# Patient Record
Sex: Female | Born: 1935
Health system: Southern US, Community
[De-identification: ages and names within clinical notes are randomized; demographics above are authoritative.]

## PROBLEM LIST (undated history)

## (undated) DIAGNOSIS — M069 Rheumatoid arthritis, unspecified: Secondary | ICD-10-CM

## (undated) DIAGNOSIS — K5790 Diverticulosis of intestine, part unspecified, without perforation or abscess without bleeding: Secondary | ICD-10-CM

## (undated) DIAGNOSIS — I480 Paroxysmal atrial fibrillation: Secondary | ICD-10-CM

## (undated) DIAGNOSIS — Z87898 Personal history of other specified conditions: Secondary | ICD-10-CM

## (undated) DIAGNOSIS — I10 Essential (primary) hypertension: Secondary | ICD-10-CM

## (undated) DIAGNOSIS — J449 Chronic obstructive pulmonary disease, unspecified: Secondary | ICD-10-CM

## (undated) DIAGNOSIS — I890 Lymphedema, not elsewhere classified: Secondary | ICD-10-CM

## (undated) DIAGNOSIS — N183 Chronic kidney disease, stage 3 unspecified: Secondary | ICD-10-CM

## (undated) DIAGNOSIS — E782 Mixed hyperlipidemia: Secondary | ICD-10-CM

## (undated) HISTORY — DX: Diverticulosis of intestine, part unspecified, without perforation or abscess without bleeding: K57.90

## (undated) HISTORY — DX: Rheumatoid arthritis, unspecified: M06.9

## (undated) HISTORY — DX: Essential (primary) hypertension: I10

## (undated) HISTORY — DX: Personal history of other specified conditions: Z87.898

## (undated) HISTORY — DX: Chronic obstructive pulmonary disease, unspecified: J44.9

## (undated) HISTORY — DX: Paroxysmal atrial fibrillation: I48.0

## (undated) HISTORY — DX: Mixed hyperlipidemia: E78.2

## (undated) HISTORY — DX: Chronic kidney disease, stage 3 unspecified: N18.30

---

## 1967-11-18 HISTORY — PX: TOTAL ABDOMINAL HYSTERECTOMY: SHX209

## 2009-01-23 ENCOUNTER — Ambulatory Visit: Payer: Self-pay | Admitting: Cardiology

## 2009-02-20 ENCOUNTER — Ambulatory Visit: Payer: Self-pay | Admitting: Cardiology

## 2009-03-08 ENCOUNTER — Ambulatory Visit: Payer: Self-pay | Admitting: Cardiology

## 2009-09-03 DIAGNOSIS — R0602 Shortness of breath: Secondary | ICD-10-CM

## 2009-09-03 DIAGNOSIS — E785 Hyperlipidemia, unspecified: Secondary | ICD-10-CM | POA: Insufficient documentation

## 2009-09-03 DIAGNOSIS — I10 Essential (primary) hypertension: Secondary | ICD-10-CM | POA: Insufficient documentation

## 2011-04-01 NOTE — Assessment & Plan Note (Signed)
[facility] [address]                          [facility]ICE NOTE   NAME:Berry, Courtney WIST                     MRN:          [id number]  DATE:03/08/2009                            DOB:          [birth year]    PRIMARY CARDIOLOGIST:  Jonelle Sidle, MD   REASON FOR VISIT:  Scheduled followup.  Please refer to Dr. Ival Bible  initial consultation note of January 23, 2009, for complete details.   Courtney Berry returns to the clinic in followup of recent diagnostic  testing for evaluation of atypical chest pain.  A 2-D echocardiogram  indicated normal left ventricular function with normal wall motion,  moderate concentric LVH, and no significant valvular abnormalities.   A pharmacologic stress test, also reviewed by Dr. Diona Browner, was  suggestive of a mild, partially reversible defect in the anterior apical  wall; normal LVF (EF 63%).   Clinically, Courtney Berry continues to report no exertional angina pectoris  or significant dyspnea.  When she was last seen, she was complaining of  symptoms suggestive of bronchitis.  This has since resolved.   CURRENT MEDICATIONS:  1. Aspirin 81 daily.  2. Hydrochlorothiazide 12.5 daily.  3. Crestor 10 daily.   PHYSICAL EXAMINATION:  VITAL SIGNS:  Blood pressure 167/84; pulse 91,  regular; and weight 211.6.  GENERAL:  A 77 year old female sitting upright in no distress.  HEENT:  Normocephalic, atraumatic.  NECK:  Palpable carotid pulse without bruits; no JVD.  LUNGS:  Clear to auscultation in all fields.  HEART:  Regular rate rhythm.  No significant murmurs.  No rubs.  ABDOMEN:  Soft, nontender.  EXTREMITIES:  No edema.  NEURO:  No focal deficit.   IMPRESSION:  1. Atypical chest pain.      a.     Normal left ventricular function with no obvious wall motion       abnormalities, by 2-D echocardiogram.      b.     Low-risk pharmacologic profusion imaging study.  2. Hypertension, uncontrolled.  3. Dyslipidemia.   PLAN:  No further cardiac testing is indicated at this point in time.  The patient does need, however, to continue aggressive medical  management for her blood pressure.  I instructed her to monitor this  closely in the ambulatory setting, and to review this with Dr. Charm Barges at  time of her next scheduled followup.  In the meanwhile, she is to remain  on low-dose aspirin indefinitely.  She is also to remain on a statin,  with continued close monitoring and management by Dr. Charm Barges.  We will  otherwise have her return to Dr. Diona Browner, on an as-needed basis.      Rozell Searing, PA-C  Electronically Signed      Jonelle Sidle, MD  Electronically Signed   GS/MedQ  DD: 03/08/2009  DT: 03/09/2009  Job #: [id number]   cc:   Samuel Jester

## 2011-04-01 NOTE — Assessment & Plan Note (Signed)
[facility]                          [facility]ICE NOTE   NAME:Deloatch, Courtney Berry                     MRN:          [id number]  DATE:01/23/2009                            DOB:          03-23-1933    REFERRING PHYSICIAN:  Dr. Aniceto Boss   REASON FOR EVALUATION:  Chest pain.   HISTORY OF PRESENT ILLNESS:  Ms. Yeagle is a pleasant 77 year old woman  with a history of hypertension and hyperlipidemia.  She states that she  has had episodes of bronchitis over the years, but has had more trouble  with this lately.  She reports developing symptoms of cough, wheezing,  and shortness of breath, beginning sometime in early February, not  associated with any obvious fevers or sputum production, and originally  treated with azithromycin, steroids, and a cough suppressant based on  records.  She was felt to have a component of sinusitis as well.  There  is description in the notes that she may have had some chest tightness  at that time, although Ms. Tarkowski denies this today.  I asked her  several times about any recent chest pain and she really describes only  problems with cough and a mild sense of shortness of breath.  She denies  any typical exertional chest pain or pressure.  She has not undergone  any prior cardiac ischemic evaluation, however.  Her electrocardiogram  shows sinus rhythm with occasional premature ventricular complexes and  otherwise nonspecific ST changes.  Ms. Wetsel denies any palpitations,  dizziness, or syncope.  She states that her cough and shortness of  breath originally improved, although she started to have return of  symptoms this week and plans to see Marylene Land back in the office soon.  She  reports being around a sister who has been sick.   ALLERGIES:  PENICILLIN, SULFA drug, MACROBID, and DARVOCET.   CURRENT MEDICATIONS:  1. Aspirin 81 mg p.o. daily.  2. Crestor 10 mg p.o. daily.  3. Hydrochlorothiazide 12.5 mg p.o.  daily.  4. Naprosyn p.r.n.  5. Tylenol p.r.n.   PAST MEDICAL HISTORY:  As outlined above.  She denies any personal  history of diabetes mellitus.  She has had previous problems with  diverticulosis and a status post hysterectomy in 1969.   FAMILY HISTORY:  Reviewed and is significant for cardiovascular disease  in one of the patient's 4 sisters with coronary bypass grafting in age  56.  Otherwise, no premature cardiovascular disease noted.  The  patient's mother and father died of cancer as well as 1 of her brothers.   SOCIAL HISTORY:  The patient has no active tobacco use history.  Denies  any alcohol use.  She describes herself as active, but does not exercise  regularly.   REVIEW OF SYSTEMS:  As detailed above.  She has had no hemoptysis,  melena, or hematochezia.  She complains of chronic arthritic knee pain  and uses p.r.n. Naprosyn and Tylenol for this.  No clear claudication  based on review of systems.  Appetite has been stable.  No unintended  weight loss.  Otherwise, negative.  PHYSICAL EXAMINATION:  VITAL SIGNS:  Blood pressure was elevated at  173/90, heart rate is 87, weight is 211.6 pounds.  GENERAL:  The patient is in no acute distress.  HEENT:  Conjunctiva is normal.  Oropharynx is clear.  NECK:  Supple.  No elevated jugular venous pressure noted.  No loud  carotid bruits.  LUNGS:  Clear with diminished breath sounds throughout.  Nonlabored  breathing.  There is no wheezing or obvious egophony. CARDIAC:  Regular  rate and rhythm, occasional ectopic beat.  Soft S4 and soft basal  systolic murmur.  PMI is indistinct.  ABDOMEN:  Soft and nontender.  No  active bowel sounds.  I would able to palpate liver edge.  EXTREMITIES:  No frank pitting edema.  There is mild venous stasis.  Distal pulses are 1+.  SKIN:  Warm and dry.  MUSCULOSKELETAL:  No kyphosis noted.  NEUROPSYCHIATRIC:  The patient is alert and orientedx3.  Affect seems  appropriate.   IMPRESSION AND  RECOMMENDATIONS:  Recent history of cough, shortness of  breath, wheezing, and possibly some intermittent atypical chest pain,  although the patient denies this today.  This is in the setting of a  reported history of recurrent bronchitis.  Her resting electrocardiogram  shows no acute ST-T wave changes, although she does have some premature  ventricular complexes noted occasionally.  She has had no associated  dizziness or syncope and has had no obvious orthopnea or progressive  lower extremity edema.  It may well be that her symptoms are most  reflective of bronchitis flare.  She does have cardiac risk factors  including hypertension and hyperlipidemia with some element of family  history involving 1 of her sisters.  I agree that basic cardiac risk  stratification would be reasonable.  Ms. Tilmon is not inclined to  proceed with stress testing now until she gets over her bronchitis.  I  would like to obtain a baseline echocardiogram to ensure that she does  not have any left ventricular systolic dysfunction.  If this is the  case, we will need to consider testing sooner rather than later.  Otherwise, we will plan to schedule an adenosine Cardiolite over the  next month and I will plan to bring back to the office to discuss the  results.  Further plans to follow.     Jonelle Sidle, MD  Electronically Signed    SGM/MedQ  DD: 01/23/2009  DT: 01/23/2009  Job #: [id number]   cc:   Samuel Jester

## 2011-05-22 ENCOUNTER — Encounter: Payer: Self-pay | Admitting: Cardiology

## 2012-02-23 ENCOUNTER — Other Ambulatory Visit: Payer: Self-pay

## 2012-02-27 ENCOUNTER — Encounter: Payer: Self-pay | Admitting: *Deleted

## 2012-03-02 ENCOUNTER — Encounter: Payer: Self-pay | Admitting: Cardiology

## 2012-03-02 ENCOUNTER — Ambulatory Visit (INDEPENDENT_AMBULATORY_CARE_PROVIDER_SITE_OTHER): Payer: Medicare Other | Admitting: Cardiology

## 2012-03-02 VITALS — BP 147/83 | HR 96 | Ht 67.0 in | Wt 213.1 lb

## 2012-03-02 DIAGNOSIS — E785 Hyperlipidemia, unspecified: Secondary | ICD-10-CM

## 2012-03-02 DIAGNOSIS — R002 Palpitations: Secondary | ICD-10-CM

## 2012-03-02 DIAGNOSIS — I10 Essential (primary) hypertension: Secondary | ICD-10-CM

## 2012-03-02 MED ORDER — METOPROLOL SUCCINATE ER 25 MG PO TB24: 25.0000 mg | ORAL_TABLET | Freq: Every day | ORAL | Status: DC

## 2012-03-02 NOTE — Progress Notes (Signed)
   Clinical Summary [name] is a 78 y.o.female presenting to the office for further evaluation. She was last seen in 2010 with atypical chest pain symptoms. Pharmacologic Cardiolite at that point revealed a mild, partially reversible defect in the anterior apex with normal LVEF of 63%. In followup she had no clear angina symptoms, and medical therapy with observation was recommended.  She is here with her daughter. Complains of intermittent palpitations, difficult for her to describe, feels like a "flutter." No chest pain, dizziness, or syncope. She has been managed for hypertension, recent addition of lisinopril. Since then symptoms seem less frequent. She has had no cardiac testing since 2010.  No exertional chest pain. Does have osteoarthritis with back pain when she stands for long periods of time.   Allergies  Allergen Reactions  . Darvocet (Propoxacet-N)   . Macrobid   . Penicillins   . Plavix (Clopidogrel Bisulfate) Other (See Comments)    ???  . Sulfa Antibiotics     Current Outpatient Prescriptions  Medication Sig Dispense Refill  . aspirin EC 81 MG tablet Take 81 mg by mouth daily.      . ergocalciferol (VITAMIN D2) 50000 UNITS capsule Take 50,000 Units by mouth once a week.      Marland Kitchen lisinopril (PRINIVIL,ZESTRIL) 10 MG tablet Take 10 mg by mouth daily.      . rosuvastatin (CRESTOR) 10 MG tablet Take 10 mg by mouth daily.      . metoprolol succinate (TOPROL XL) 25 MG 24 hr tablet Take 1 tablet (25 mg total) by mouth daily.  30 tablet  6    Past Medical History  Diagnosis Date  . Mixed hyperlipidemia   . Essential hypertension, benign   . History of chest pain     Low risk Cardiolite 2010  . Diverticulosis     Past Surgical History  Procedure Date  . Total abdominal hysterectomy 1969    Family History  Problem Relation Age of Onset  . Cancer Mother   . Heart disease Sister     CABG age 74  . Cancer Father   . Cancer Brother     Social History [name]  reports that she has never smoked. She has never used smokeless tobacco. [name] reports that she does not drink alcohol.  Review of Systems Otherwise negative.  Physical Examination Filed Vitals:   03/02/12 1412  BP: 147/83  Pulse: 96   Obese woman in no acute distress. HEENT: Conjunctiva and lids normal, oropharynx clear. Neck: Supple, no elevated JVP or carotid bruits, no thyromegaly. Lungs: Clear to auscultation, nonlabored breathing at rest. Cardiac: Regular rate and rhythm, no S3, soft systolic murmur, no pericardial rub. Abdomen: Soft, nontender, bowel sounds present, no guarding or rebound. Extremities: No pitting edema, distal pulses 1-2+. Skin: Warm and dry. Musculoskeletal: No kyphosis. Neuropsychiatric: Alert and oriented x3, affect grossly appropriate.   ECG Tracing from Dr. Silvana Newness office reviewed showing ventricular ectopy in pattern of trigeminy.    Problem List and Plan

## 2012-03-02 NOTE — Patient Instructions (Signed)
Follow up in 3 months. Stop HCTZ. Start Toprol XL (metoprolol succ) 25 mg daily. Your physician has requested that you have an echocardiogram. Echocardiography is a painless test that uses sound waves to create images of your heart. It provides your doctor with information about the size and shape of your heart and how well your heart's chambers and valves are working. This procedure takes approximately one hour. There are no restrictions for this procedure. If the results of your test are normal or stable, you will receive a letter. If they are abnormal, the nurse will contact you by phone.

## 2012-03-02 NOTE — Assessment & Plan Note (Signed)
Medical therapy as outlined above. Sodium restriction.

## 2012-03-02 NOTE — Assessment & Plan Note (Signed)
Possibly symptomatic ectopy, rule out intermittent arrhythmia. Recent ECG reviewed. Plan is for her to continue present medical therapy with the exception of discontinuing HCTZ in favor of Toprol-XL 25 mg daily. We will also obtain an echocardiogram to exclude any major cardiac structural abnormalities. Continue observation with followup arranged.

## 2012-03-02 NOTE — Assessment & Plan Note (Signed)
On statin therapy, followed by Dr. Butler. 

## 2012-03-17 ENCOUNTER — Other Ambulatory Visit (INDEPENDENT_AMBULATORY_CARE_PROVIDER_SITE_OTHER): Payer: Medicare Other

## 2012-03-17 ENCOUNTER — Other Ambulatory Visit: Payer: Self-pay

## 2012-03-17 DIAGNOSIS — R9431 Abnormal electrocardiogram [ECG] [EKG]: Secondary | ICD-10-CM

## 2012-03-17 DIAGNOSIS — R002 Palpitations: Secondary | ICD-10-CM

## 2012-05-24 ENCOUNTER — Encounter: Payer: Self-pay | Admitting: Cardiology

## 2012-05-24 ENCOUNTER — Ambulatory Visit (INDEPENDENT_AMBULATORY_CARE_PROVIDER_SITE_OTHER): Payer: Medicare Other | Admitting: Cardiology

## 2012-05-24 VITALS — BP 144/74 | HR 66 | Ht 67.0 in | Wt 216.0 lb

## 2012-05-24 DIAGNOSIS — I10 Essential (primary) hypertension: Secondary | ICD-10-CM

## 2012-05-24 DIAGNOSIS — R002 Palpitations: Secondary | ICD-10-CM

## 2012-05-24 NOTE — Patient Instructions (Signed)
Your physician recommends that you schedule a follow-up appointment in: as needed Your physician recommends that you continue on your current medications as directed. Please refer to the Current Medication list given to you today.   

## 2012-05-24 NOTE — Progress Notes (Signed)
   HPI The patient presents for followup. She saw Dr. Diona Browner for evaluation and treatment of palpitations. He switched her from hydrochlorothiazide to metoprolol XL 25 mg daily. She couldn't tolerate this because of stomach upset so she's cutting the pill in half. She's done well with this. She's had no palpitations and no further "spells". She's had no presyncope or syncope. She denies any chest pressure, neck or arm discomfort. She has no new shortness of breath, PND or orthopnea. She can't ambulate because of bad knees. She says her blood pressure has been well controlled at home.  Allergies  Allergen Reactions  . Darvocet (Propoxyphene-Acetaminophen)   . Nitrofurantoin Monohyd Macro   . Penicillins   . Plavix (Clopidogrel Bisulfate) Other (See Comments)    ???  . Sulfa Antibiotics     Current Outpatient Prescriptions  Medication Sig Dispense Refill  . aspirin EC 81 MG tablet Take 81 mg by mouth daily.      . ergocalciferol (VITAMIN D2) 50000 UNITS capsule Take 50,000 Units by mouth once a week.      Marland Kitchen lisinopril (PRINIVIL,ZESTRIL) 10 MG tablet Take 10 mg by mouth daily.      . metoprolol succinate (TOPROL XL) 25 MG 24 hr tablet Take 1 tablet (25 mg total) by mouth daily.  30 tablet  6  . rosuvastatin (CRESTOR) 10 MG tablet Take 10 mg by mouth daily.        Past Medical History  Diagnosis Date  . Mixed hyperlipidemia   . Essential hypertension, benign   . History of chest pain     Low risk Cardiolite 2010  . Diverticulosis     Past Surgical History  Procedure Date  . Total abdominal hysterectomy 1969    ROS:  As stated in the HPI and negative for all other systems.  PHYSICAL EXAM Ht 5\' 7"  (1.702 m) GENERAL:  Well appearing NECK:  No jugular venous distention, waveform within normal limits, carotid upstroke brisk and symmetric, no bruits, no thyromegaly LUNGS:  Clear to auscultation bilaterally BACK:  No CVA tenderness CHEST:  Unremarkable HEART:  PMI not displaced  or sustained,S1 and S2 within normal limits, no S3, no S4, no clicks, no rubs, no murmurs ABD:  Flat, positive bowel sounds normal in frequency in pitch, no bruits, no rebound, no guarding, no midline pulsatile mass, no hepatomegaly, no splenomegaly EXT:  2 plus pulses throughout, no edema, no cyanosis no clubbing   ASSESSMENT AND PLAN

## 2012-05-24 NOTE — Assessment & Plan Note (Signed)
The blood pressure is at target. No change in medications is indicated. We will continue with therapeutic lifestyle changes (TLC).  

## 2012-05-24 NOTE — Assessment & Plan Note (Signed)
These are no longer problematic.  No further work up is indicated.  She will continue the meds as listed.

## 2013-03-09 ENCOUNTER — Other Ambulatory Visit: Payer: Self-pay | Admitting: *Deleted

## 2013-03-09 MED ORDER — METOPROLOL SUCCINATE ER 25 MG PO TB24: 25.0000 mg | ORAL_TABLET | Freq: Every day | ORAL | Status: DC

## 2014-02-20 ENCOUNTER — Encounter: Payer: Self-pay | Admitting: Cardiology

## 2014-02-20 ENCOUNTER — Ambulatory Visit (INDEPENDENT_AMBULATORY_CARE_PROVIDER_SITE_OTHER): Payer: Medicare Other | Admitting: Cardiology

## 2014-02-20 VITALS — BP 191/70 | HR 54 | Ht 69.0 in | Wt 217.0 lb

## 2014-02-20 DIAGNOSIS — R0602 Shortness of breath: Secondary | ICD-10-CM

## 2014-02-20 DIAGNOSIS — I10 Essential (primary) hypertension: Secondary | ICD-10-CM

## 2014-02-20 DIAGNOSIS — E785 Hyperlipidemia, unspecified: Secondary | ICD-10-CM

## 2014-02-20 DIAGNOSIS — Z79899 Other long term (current) drug therapy: Secondary | ICD-10-CM

## 2014-02-20 DIAGNOSIS — R002 Palpitations: Secondary | ICD-10-CM

## 2014-02-20 MED ORDER — HYDROCHLOROTHIAZIDE 12.5 MG PO CAPS: 12.5000 mg | ORAL_CAPSULE | Freq: Every day | ORAL | Status: DC

## 2014-02-20 NOTE — Assessment & Plan Note (Signed)
On Crestor, LDL 52 as of September 2014.

## 2014-02-20 NOTE — Progress Notes (Signed)
Clinical Summary Courtney Berry is a 80 y.o.female last seen by Dr. Antoine Poche in July 2013. I had seen her prior to that. She is here with her daughter today. Referred back to the office with some recent symptoms of relative fatigue and shortness of breath. She states she was temporarily placed on a low-dose diuretic for a week and did feel better. Has ankle edema, right worse than left, also reports arthritic right knee pain that limits her. She has had no exertional chest pain. Still states the palpitations are much better on beta blocker.  Previous testing includes a pharmacologic Cardiolite in 2010 that revealed a mild, partially reversible defect in the anterior apex with normal LVEF of 63%. She has been managed medically.  More recently, echocardiogram in May 2013 revealed moderate LVH with LVEF 55-60%, grade 1 diastolic dysfunction, no major valvular abnormalities with normal pulmonary artery systolic pressure.  Lab work from September 2014 showed BUN 14, creatinine 1.0, normal AST and ALT, triglycerides 160, cholesterol 144, HDL 60, LDL 52, potassium 4.3, TSH 1.7, hemoglobin A1c 5.6, hemoglobin 13.7, platelets 150.   Allergies  Allergen Reactions  . Darvocet [Propoxyphene N-Acetaminophen]   . Nitrofurantoin Monohyd Macro   . Penicillins   . Plavix [Clopidogrel Bisulfate] Other (See Comments)    ???  . Sulfa Antibiotics     Current Outpatient Prescriptions  Medication Sig Dispense Refill  . aspirin EC 81 MG tablet Take 81 mg by mouth daily.      . ergocalciferol (VITAMIN D2) 50000 UNITS capsule Take 50,000 Units by mouth once a week.      . fluticasone (FLONASE) 50 MCG/ACT nasal spray Place 1-2 sprays into both nostrils as needed.      Marland Kitchen lisinopril (PRINIVIL,ZESTRIL) 10 MG tablet Take 10 mg by mouth daily.      . metoprolol succinate (TOPROL-XL) 25 MG 24 hr tablet Take 1 tablet (25 mg total) by mouth daily.  30 tablet  3  . rosuvastatin (CRESTOR) 10 MG tablet Take 10 mg by mouth  daily.      . hydrochlorothiazide (MICROZIDE) 12.5 MG capsule Take 1 capsule (12.5 mg total) by mouth daily.  30 capsule  6   No current facility-administered medications for this visit.    Past Medical History  Diagnosis Date  . Mixed hyperlipidemia   . Essential hypertension, benign   . History of chest pain     Low risk Cardiolite 2010  . Diverticulosis     Past Surgical History  Procedure Laterality Date  . Total abdominal hysterectomy  1969    Family History  Problem Relation Age of Onset  . Cancer Mother   . Heart disease Sister     CABG age 64  . Cancer Father   . Cancer Brother     Social History Ms. Bently reports that she has never smoked. She has never used smokeless tobacco. Ms. Impson reports that she does not drink alcohol.  Review of Systems Negative except as outlined.  Physical Examination Filed Vitals:   02/20/14 0915  BP: 191/70  Pulse: 54   Filed Weights   02/20/14 0915  Weight: 217 lb (98.431 kg)    Obese woman, appears comfortable at rest.  HEENT: Conjunctiva and lids normal, oropharynx clear.  Neck: Supple, no elevated JVP or carotid bruits, no thyromegaly.  Lungs: Clear to auscultation, nonlabored breathing at rest.  Cardiac: Regular rate and rhythm, no S3, soft systolic murmur, no pericardial rub.  Abdomen: Soft, nontender, bowel sounds  present, no guarding or rebound.  Extremities: Ankle edema, distal pulses 1-2+.  Skin: Warm and dry.  Musculoskeletal: No kyphosis.  Neuropsychiatric: Alert and oriented x3, affect grossly appropriate.   Problem List and Plan   Shortness of breath Also fatigue, but no exertional chest pain. ECG today shows normal sinus rhythm, borderline low voltage. Plan is to initiate HCTZ 12.5 mg daily in addition to baseline hypertensive regimen, also followup with echocardiogram to reassess cardiac structure and function. Office visit in the next 3-4 weeks with BMET.  Palpitations Improved on beta  blocker.  HYPERTENSION, UNSPECIFIED Adding HCTZ as noted above. No change to other medications.  HYPERLIPIDEMIA-MIXED On Crestor, LDL 52 as of September 2014.    Jonelle Sidle, M.D., F.A.C.C.

## 2014-02-20 NOTE — Assessment & Plan Note (Signed)
Adding HCTZ as noted above. No change to other medications.

## 2014-02-20 NOTE — Assessment & Plan Note (Signed)
Improved on beta blocker  

## 2014-02-20 NOTE — Assessment & Plan Note (Signed)
Also fatigue, but no exertional chest pain. ECG today shows normal sinus rhythm, borderline low voltage. Plan is to initiate HCTZ 12.5 mg daily in addition to baseline hypertensive regimen, also followup with echocardiogram to reassess cardiac structure and function. Office visit in the next 3-4 weeks with BMET.

## 2014-02-20 NOTE — Patient Instructions (Signed)
Your physician has requested that you have an echocardiogram. Echocardiography is a painless test that uses sound waves to create images of your heart. It provides your doctor with information about the size and shape of your heart and how well your heart's chambers and valves are working. This procedure takes approximately one hour. There are no restrictions for this procedure. Office will contact with results via phone or letter.   Begin HCTZ 12.5mg  daily - new sent to pharm Continue all other medications.   Lab for BMET - due just prior to next office visit Follow up in  3-4 weeks

## 2014-03-02 ENCOUNTER — Other Ambulatory Visit (INDEPENDENT_AMBULATORY_CARE_PROVIDER_SITE_OTHER): Payer: Medicare Other

## 2014-03-02 ENCOUNTER — Other Ambulatory Visit: Payer: Self-pay

## 2014-03-02 DIAGNOSIS — I059 Rheumatic mitral valve disease, unspecified: Secondary | ICD-10-CM

## 2014-03-02 DIAGNOSIS — I359 Nonrheumatic aortic valve disorder, unspecified: Secondary | ICD-10-CM

## 2014-03-02 DIAGNOSIS — R0602 Shortness of breath: Secondary | ICD-10-CM

## 2014-03-03 ENCOUNTER — Telehealth: Payer: Self-pay | Admitting: *Deleted

## 2014-03-03 NOTE — Telephone Encounter (Signed)
Message copied by Eustace Moore on Fri Mar 03, 2014  1:31 PM ------      Message from: MCDOWELL, Illene Bolus      Created: Thu Mar 02, 2014  1:00 PM       Reviewed. LVEF remains normal range at 60-65%, mild diastolic dysfunction with hypertension. We already added HCTZ to her regimen. Keep planned followup. ------

## 2014-03-03 NOTE — Telephone Encounter (Signed)
Message copied by Eustace Moore on Fri Mar 03, 2014  4:02 PM ------      Message from: MCDOWELL, Illene Bolus      Created: Thu Mar 02, 2014  1:00 PM       Reviewed. LVEF remains normal range at 60-65%, mild diastolic dysfunction with hypertension. We already added HCTZ to her regimen. Keep planned followup. ------

## 2014-03-03 NOTE — Telephone Encounter (Signed)
Patient informed. 

## 2014-03-06 ENCOUNTER — Ambulatory Visit: Payer: Medicare Other | Admitting: Cardiology

## 2014-04-04 ENCOUNTER — Ambulatory Visit: Payer: Medicare Other | Admitting: Cardiology

## 2014-04-19 ENCOUNTER — Encounter: Payer: Self-pay | Admitting: Cardiology

## 2014-04-19 NOTE — Progress Notes (Signed)
Clinical Summary [name] is a 80 y.o.female last seen in April of this year. Low-dose HCTZ was added to her regimen at the last visit. Record review finds ER visit at [facility] recently with diagnosis of bronchitis. She states that she is starting to feel better. Plans to followup with [name]. No chest pain or palpitations.  Recent laboratory [facility] showed BUN 14, creatinine 0.9, normal LFTs, potassium 3.7, troponin I normal, BNP normal at 15, hemoglobin 13.5, platelets 123. Chest x-ray reported bronchitic changes, no other acute abnormalities.  Previous testing includes a pharmacologic Cardiolite in 2010 that revealed a mild, partially reversible defect in the anterior apex with normal LVEF of 63%. She has been managed medically.  Followup echocardiogram in April of this year showed moderate LVH with LVEF 60-65%, grade 1 diastolic dysfunction, mildly sclerotic aortic valve, mild mitral regurgitation.   Allergies  Allergen Reactions  . Darvocet [Propoxyphene N-Acetaminophen]   . Nitrofurantoin Monohyd Macro   . Penicillins   . Plavix [Clopidogrel Bisulfate] Other (See Comments)    ???  . Sulfa Antibiotics     Current Outpatient Prescriptions  Medication Sig Dispense Refill  . aspirin EC 81 MG tablet Take 81 mg by mouth daily.      . fluticasone (FLONASE) 50 MCG/ACT nasal spray Place 1-2 sprays into both nostrils as needed.      . hydrochlorothiazide (MICROZIDE) 12.5 MG capsule Take 1 capsule (12.5 mg total) by mouth daily.  30 capsule  6  . lisinopril (PRINIVIL,ZESTRIL) 10 MG tablet Take 10 mg by mouth daily.      . metoprolol succinate (TOPROL-XL) 25 MG 24 hr tablet Take 1 tablet (25 mg total) by mouth daily.  30 tablet  3  . rosuvastatin (CRESTOR) 10 MG tablet Take 10 mg by mouth daily.      Marland Kitchen VITAMIN D, CHOLECALCIFEROL, PO Take 1 tablet by mouth daily.       No current facility-administered medications for this visit.    Past Medical History  Diagnosis Date  . Mixed  hyperlipidemia   . Essential hypertension, benign   . History of chest pain     Low risk Cardiolite 2010  . Diverticulosis     Social History Courtney Berry reports that she has never smoked. She has never used smokeless tobacco. Courtney Berry reports that she does not drink alcohol.  Review of Systems Negative except as outlined.  Physical Examination Filed Vitals:   04/20/14 1131  BP: 137/72  Pulse: 62   Filed Weights   04/20/14 1131  Weight: 213 lb 12.8 oz (96.979 kg)    Obese woman, appears comfortable at rest.  HEENT: Conjunctiva and lids normal, oropharynx clear.  Neck: Supple, no elevated JVP or carotid bruits, no thyromegaly.  Lungs: Clear to auscultation, nonlabored breathing at rest.  Cardiac: Regular rate and rhythm, no S3, soft systolic murmur, no pericardial rub.  Abdomen: Soft, nontender, bowel sounds present, no guarding or rebound.  Extremities: Ankle edema, distal pulses 1-2+.    Problem List and Plan   Shortness of breath Generally improved. Plan will be to continue current cardiac regimen, known grade 1 diastolic dysfunction by echocardiogram. She has had some recent bronchitis as well. Lab work showed normal troponin I and BNP at ER visit. Keep regular followup with primary care provider, we will see her back in 6 months.  HYPERLIPIDEMIA-MIXED Continues on Crestor, keep followup with [name].  Palpitations No recurrences.    Courtney Berry, M.D., F.A.C.C.

## 2014-04-20 ENCOUNTER — Ambulatory Visit (INDEPENDENT_AMBULATORY_CARE_PROVIDER_SITE_OTHER): Payer: Medicare Other | Admitting: Cardiology

## 2014-04-20 ENCOUNTER — Encounter: Payer: Self-pay | Admitting: Cardiology

## 2014-04-20 VITALS — BP 137/72 | HR 62 | Ht 69.0 in | Wt 213.8 lb

## 2014-04-20 DIAGNOSIS — E785 Hyperlipidemia, unspecified: Secondary | ICD-10-CM

## 2014-04-20 DIAGNOSIS — R0602 Shortness of breath: Secondary | ICD-10-CM

## 2014-04-20 DIAGNOSIS — R002 Palpitations: Secondary | ICD-10-CM

## 2014-04-20 NOTE — Assessment & Plan Note (Signed)
Continues on Crestor, keep followup with [name].

## 2014-04-20 NOTE — Patient Instructions (Signed)

## 2014-04-20 NOTE — Assessment & Plan Note (Signed)
Generally improved. Plan will be to continue current cardiac regimen, known grade 1 diastolic dysfunction by echocardiogram. She has had some recent bronchitis as well. Lab work showed normal troponin I and BNP at ER visit. Keep regular followup with primary care provider, we will see her back in 6 months.

## 2014-04-20 NOTE — Assessment & Plan Note (Signed)
No recurrences.

## 2014-08-22 ENCOUNTER — Telehealth: Payer: Self-pay | Admitting: Cardiology

## 2014-08-29 NOTE — Telephone Encounter (Signed)
Patient informed that she didn't have any outstanding lab orders that was requested.

## 2014-10-25 ENCOUNTER — Encounter: Payer: Self-pay | Admitting: Cardiology

## 2014-10-25 ENCOUNTER — Ambulatory Visit (INDEPENDENT_AMBULATORY_CARE_PROVIDER_SITE_OTHER): Payer: Medicare Other | Admitting: Cardiology

## 2014-10-25 VITALS — BP 150/78 | HR 61 | Ht 69.0 in | Wt 206.0 lb

## 2014-10-25 DIAGNOSIS — I10 Essential (primary) hypertension: Secondary | ICD-10-CM

## 2014-10-25 DIAGNOSIS — R0602 Shortness of breath: Secondary | ICD-10-CM

## 2014-10-25 NOTE — Assessment & Plan Note (Signed)
Blood pressure elevated today. I recommended that she keep follow-up with Ms Yetta Barre, watch sodium in her diet. Current regimen includes beta blocker, ACE inhibitor, and diuretic.

## 2014-10-25 NOTE — Assessment & Plan Note (Signed)
No overall change in status, specifically no decline with NYHA class II dyspnea. Continue medical therapy and observation. Keep follow-up with primary care provider.

## 2014-10-25 NOTE — Patient Instructions (Signed)

## 2014-10-25 NOTE — Progress Notes (Signed)
   Reason for visit: Follow-up shortness of breath  Clinical Summary Ms. Lagunes is a 80 y.o.female last seen in June. She presents for a routine visit. Reports no significant change in overall stamina, had a "bad spell" back in early November under emotional stress, but otherwise NYHA class II dyspnea and no chest pain. She reports compliance with her medications including ACE inhibitor, diuretic, and beta blocker.  Previous testing includes a pharmacologic Cardiolite in 2010 that revealed a mild, partially reversible defect in the anterior apex with normal LVEF of 63%. She has been managed medically. Followup echocardiogram in April of this year showed moderate LVH with LVEF 60-65%, grade 1 diastolic dysfunction, mildly sclerotic aortic valve, mild mitral regurgitation.   Allergies  Allergen Reactions  . Darvocet [Propoxyphene N-Acetaminophen]   . Nitrofurantoin Monohyd Macro   . Penicillins   . Plavix [Clopidogrel Bisulfate] Other (See Comments)    ???  . Sulfa Antibiotics     Current Outpatient Prescriptions  Medication Sig Dispense Refill  . aspirin EC 81 MG tablet Take 81 mg by mouth daily.    . fluticasone (FLONASE) 50 MCG/ACT nasal spray Place 1-2 sprays into both nostrils as needed.    . hydrochlorothiazide (MICROZIDE) 12.5 MG capsule Take 1 capsule (12.5 mg total) by mouth daily. 30 capsule 6  . lisinopril (PRINIVIL,ZESTRIL) 10 MG tablet Take 10 mg by mouth daily.    . metoprolol succinate (TOPROL-XL) 25 MG 24 hr tablet Take 1 tablet (25 mg total) by mouth daily. 30 tablet 3  . rosuvastatin (CRESTOR) 10 MG tablet Take 10 mg by mouth daily.    Marland Kitchen VITAMIN D, CHOLECALCIFEROL, PO Take 1 tablet by mouth daily.     No current facility-administered medications for this visit.    Past Medical History  Diagnosis Date  . Mixed hyperlipidemia   . Essential hypertension, benign   . History of chest pain     Low risk Cardiolite 2010  . Diverticulosis     Social History Ms. Alvarez  reports that she has never smoked. She has never used smokeless tobacco. Ms. Kowal reports that she does not drink alcohol.  Review of Systems Complete review of systems negative except as otherwise outlined in the clinical summary and also the following. Chronic bilateral knee pain.  Physical Examination Filed Vitals:   10/25/14 0947  BP: 150/78  Pulse: 61   Filed Weights   10/25/14 0947  Weight: 206 lb (93.441 kg)     Obese woman, appears comfortable at rest.  HEENT: Conjunctiva and lids normal, oropharynx clear.  Neck: Supple, no elevated JVP or carotid bruits, no thyromegaly.  Lungs: Clear to auscultation, nonlabored breathing at rest.  Cardiac: Regular rate and rhythm, no S3, soft systolic murmur, no pericardial rub.  Abdomen: Soft, nontender, bowel sounds present, no guarding or rebound.  Extremities: Ankle edema, distal pulses 1-2+.    Problem List and Plan   Shortness of breath No overall change in status, specifically no decline with NYHA class II dyspnea. Continue medical therapy and observation. Keep follow-up with primary care provider.  Essential hypertension Blood pressure elevated today. I recommended that she keep follow-up with Ms Yetta Barre, watch sodium in her diet. Current regimen includes beta blocker, ACE inhibitor, and diuretic.    Jonelle Sidle, M.D., F.A.C.C.

## 2015-04-17 DIAGNOSIS — E785 Hyperlipidemia, unspecified: Secondary | ICD-10-CM | POA: Diagnosis not present

## 2015-04-17 DIAGNOSIS — E559 Vitamin D deficiency, unspecified: Secondary | ICD-10-CM | POA: Diagnosis not present

## 2015-04-17 DIAGNOSIS — I10 Essential (primary) hypertension: Secondary | ICD-10-CM | POA: Diagnosis not present

## 2015-04-17 DIAGNOSIS — I499 Cardiac arrhythmia, unspecified: Secondary | ICD-10-CM | POA: Diagnosis not present

## 2015-04-19 DIAGNOSIS — Z1231 Encounter for screening mammogram for malignant neoplasm of breast: Secondary | ICD-10-CM | POA: Diagnosis not present

## 2015-04-26 ENCOUNTER — Ambulatory Visit (INDEPENDENT_AMBULATORY_CARE_PROVIDER_SITE_OTHER): Payer: Medicare Other | Admitting: Cardiology

## 2015-04-26 ENCOUNTER — Encounter: Payer: Self-pay | Admitting: Cardiology

## 2015-04-26 VITALS — BP 150/82 | HR 66 | Ht 69.0 in | Wt 207.0 lb

## 2015-04-26 DIAGNOSIS — R002 Palpitations: Secondary | ICD-10-CM

## 2015-04-26 DIAGNOSIS — Z87898 Personal history of other specified conditions: Secondary | ICD-10-CM | POA: Diagnosis not present

## 2015-04-26 DIAGNOSIS — E782 Mixed hyperlipidemia: Secondary | ICD-10-CM

## 2015-04-26 DIAGNOSIS — I10 Essential (primary) hypertension: Secondary | ICD-10-CM | POA: Diagnosis not present

## 2015-04-26 NOTE — Progress Notes (Signed)
Cardiology Office Note  Date: 04/26/2015   ID: Courtney Berry, DOB 15-Aug-1933, MRN [id number]  PCP: Remus Loffler, PA-C  Primary Cardiologist: Nona Dell, MD   Chief Complaint  Patient presents with  . Cardiac follow-up    History of Present Illness: Courtney Berry is an 81 y.o. female last seen in June 2015. She presents for a routine follow-up visit. Over the last year she reports having no problems with chest pain or increasing shortness of breath. She has only occasional, brief palpitations, these have not been progressive or associated with syncope.  Follow-up ECG today is normal.  She did have recent lab work with her primary care provider, reviewed below.   Past Medical History  Diagnosis Date  . Mixed hyperlipidemia   . Essential hypertension, benign   . History of chest pain     Low risk Cardiolite 2010  . Diverticulosis      Current Outpatient Prescriptions  Medication Sig Dispense Refill  . aspirin EC 81 MG tablet Take 81 mg by mouth daily.    . fluticasone (FLONASE) 50 MCG/ACT nasal spray Place 1-2 sprays into both nostrils as needed.    . hydrochlorothiazide (MICROZIDE) 12.5 MG capsule Take 1 capsule (12.5 mg total) by mouth daily. 30 capsule 6  . lisinopril (PRINIVIL,ZESTRIL) 10 MG tablet Take 10 mg by mouth daily.    . metoprolol succinate (TOPROL-XL) 25 MG 24 hr tablet Take 1 tablet (25 mg total) by mouth daily. 30 tablet 3  . rosuvastatin (CRESTOR) 10 MG tablet Take 10 mg by mouth daily.    Marland Kitchen VITAMIN D, CHOLECALCIFEROL, PO Take 1 tablet by mouth daily.     No current facility-administered medications for this visit.    Allergies:  Darvocet; Nitrofurantoin monohyd macro; Penicillins; Plavix; and Sulfa antibiotics   Social History: The patient  reports that she has never smoked. She has never used smokeless tobacco. She reports that she does not drink alcohol or use illicit drugs.   ROS:  Please see the history of present illness. Otherwise,  complete review of systems is positive for "sinus trouble."  All other systems are reviewed and negative.   Physical Exam: VS:  BP 150/82 mmHg  Pulse 66  Ht 5\' 9"  (1.753 m)  Wt 207 lb (93.895 kg)  BMI 30.55 kg/m2  SpO2 93%, BMI Body mass index is 30.55 kg/(m^2).  Wt Readings from Last 3 Encounters:  04/26/15 207 lb (93.895 kg)  10/25/14 206 lb (93.441 kg)  04/20/14 213 lb 12.8 oz (96.979 kg)     Obese woman, appears comfortable at rest.  HEENT: Conjunctiva and lids normal, oropharynx clear.  Neck: Supple, no elevated JVP or carotid bruits, no thyromegaly.  Lungs: Clear to auscultation, nonlabored breathing at rest.  Cardiac: Regular rate and rhythm, no S3, soft systolic murmur, no pericardial rub.  Abdomen: Soft, nontender, bowel sounds present, no guarding or rebound.  Extremities: Ankle edema, distal pulses 1-2+.    ECG: ECG is ordered today and reviewed showing normal sinus rhythm.   Recent Labwork:  04/17/2015: Cholesterol 200, HDL 70, LDL 82, triglycerides 237, hemoglobin 13.7, platelet 176, AST 19, ALT 13, BUN 12, creatinine 0.9, potassium 4.7, TSH 2.5  Other Studies Reviewed Today:  Echocardiogram 03/02/2014: Study Conclusions  - Left ventricle: The cavity size was normal. Wall thickness was increased in a pattern of moderate LVH. Systolic function was normal. The estimated ejection fraction was in the range of 60% to 65%. Mild calcification of the  chordae tendinae seen. Wall motion was normal; there were no regional wall motion abnormalities. There was an increased relative contribution of atrial contraction to ventricular filling. Doppler parameters are consistent with abnormal left ventricular relaxation (grade 1 diastolic dysfunction). - Aortic valve: Mildly calcified annulus. - Mitral valve: Calcified annulus. Mildly thickened leaflets . Mild regurgitation.  Assessment and Plan:  1. History of chest pain, this has not been  recurrent over the last year. ECG today is normal.  2. Stable palpitations, infrequent, and not associated with dizziness or syncope. Continue observation on beta blocker.  3. Hyperlipidemia, on Crestor, recent LDL 82.  Current medicines were reviewed with the patient today.   Orders Placed This Encounter  Procedures  . EKG 12-Lead    Disposition: FU with me in 1 year.   Signed, Jonelle Sidle, MD, [facility] 04/26/2015 2:32 PM    [facility] at [facility] [address] [place], [place], [place] [address] Phone: [phone]; Fax: [phone]

## 2015-04-26 NOTE — Patient Instructions (Signed)

## 2015-05-17 DIAGNOSIS — M549 Dorsalgia, unspecified: Secondary | ICD-10-CM | POA: Diagnosis not present

## 2015-05-17 DIAGNOSIS — Z882 Allergy status to sulfonamides status: Secondary | ICD-10-CM | POA: Diagnosis not present

## 2015-05-17 DIAGNOSIS — J440 Chronic obstructive pulmonary disease with acute lower respiratory infection: Secondary | ICD-10-CM | POA: Diagnosis not present

## 2015-05-17 DIAGNOSIS — E780 Pure hypercholesterolemia: Secondary | ICD-10-CM | POA: Diagnosis not present

## 2015-05-17 DIAGNOSIS — J209 Acute bronchitis, unspecified: Secondary | ICD-10-CM | POA: Diagnosis not present

## 2015-05-17 DIAGNOSIS — Z888 Allergy status to other drugs, medicaments and biological substances status: Secondary | ICD-10-CM | POA: Diagnosis not present

## 2015-05-17 DIAGNOSIS — I10 Essential (primary) hypertension: Secondary | ICD-10-CM | POA: Diagnosis not present

## 2015-05-17 DIAGNOSIS — Z836 Family history of other diseases of the respiratory system: Secondary | ICD-10-CM | POA: Diagnosis not present

## 2015-05-17 DIAGNOSIS — Z880 Allergy status to penicillin: Secondary | ICD-10-CM | POA: Diagnosis not present

## 2015-05-17 DIAGNOSIS — R05 Cough: Secondary | ICD-10-CM | POA: Diagnosis not present

## 2015-05-17 DIAGNOSIS — Z7982 Long term (current) use of aspirin: Secondary | ICD-10-CM | POA: Diagnosis not present

## 2015-05-17 DIAGNOSIS — J449 Chronic obstructive pulmonary disease, unspecified: Secondary | ICD-10-CM | POA: Diagnosis not present

## 2015-05-17 DIAGNOSIS — Z79899 Other long term (current) drug therapy: Secondary | ICD-10-CM | POA: Diagnosis not present

## 2015-05-23 DIAGNOSIS — J40 Bronchitis, not specified as acute or chronic: Secondary | ICD-10-CM | POA: Diagnosis not present

## 2015-06-06 DIAGNOSIS — I10 Essential (primary) hypertension: Secondary | ICD-10-CM | POA: Diagnosis not present

## 2015-06-06 DIAGNOSIS — J40 Bronchitis, not specified as acute or chronic: Secondary | ICD-10-CM | POA: Diagnosis not present

## 2015-09-04 DIAGNOSIS — M1990 Unspecified osteoarthritis, unspecified site: Secondary | ICD-10-CM | POA: Diagnosis not present

## 2015-10-04 DIAGNOSIS — M25519 Pain in unspecified shoulder: Secondary | ICD-10-CM | POA: Diagnosis not present

## 2015-10-04 DIAGNOSIS — M25569 Pain in unspecified knee: Secondary | ICD-10-CM | POA: Diagnosis not present

## 2015-10-04 DIAGNOSIS — M1990 Unspecified osteoarthritis, unspecified site: Secondary | ICD-10-CM | POA: Diagnosis not present

## 2015-12-17 DIAGNOSIS — I499 Cardiac arrhythmia, unspecified: Secondary | ICD-10-CM | POA: Diagnosis not present

## 2015-12-17 DIAGNOSIS — M2550 Pain in unspecified joint: Secondary | ICD-10-CM | POA: Diagnosis not present

## 2015-12-17 DIAGNOSIS — I10 Essential (primary) hypertension: Secondary | ICD-10-CM | POA: Diagnosis not present

## 2015-12-17 DIAGNOSIS — M1990 Unspecified osteoarthritis, unspecified site: Secondary | ICD-10-CM | POA: Diagnosis not present

## 2015-12-17 DIAGNOSIS — E785 Hyperlipidemia, unspecified: Secondary | ICD-10-CM | POA: Diagnosis not present

## 2016-01-15 DIAGNOSIS — N390 Urinary tract infection, site not specified: Secondary | ICD-10-CM | POA: Diagnosis not present

## 2016-01-15 DIAGNOSIS — R350 Frequency of micturition: Secondary | ICD-10-CM | POA: Diagnosis not present

## 2016-01-15 DIAGNOSIS — R531 Weakness: Secondary | ICD-10-CM | POA: Diagnosis not present

## 2016-02-03 ENCOUNTER — Emergency Department (HOSPITAL_COMMUNITY)
Admission: EM | Admit: 2016-02-03 | Discharge: 2016-02-03 | Disposition: A | Discharge status: Home / Self Care | Payer: Medicare Other | Attending: Emergency Medicine | Admitting: Emergency Medicine

## 2016-02-03 ENCOUNTER — Emergency Department (HOSPITAL_COMMUNITY): Payer: Medicare Other

## 2016-02-03 DIAGNOSIS — Z79899 Other long term (current) drug therapy: Secondary | ICD-10-CM | POA: Diagnosis not present

## 2016-02-03 DIAGNOSIS — Z7951 Long term (current) use of inhaled steroids: Secondary | ICD-10-CM | POA: Insufficient documentation

## 2016-02-03 DIAGNOSIS — Z791 Long term (current) use of non-steroidal anti-inflammatories (NSAID): Secondary | ICD-10-CM | POA: Insufficient documentation

## 2016-02-03 DIAGNOSIS — I480 Paroxysmal atrial fibrillation: Secondary | ICD-10-CM | POA: Diagnosis not present

## 2016-02-03 DIAGNOSIS — R0602 Shortness of breath: Secondary | ICD-10-CM | POA: Diagnosis not present

## 2016-02-03 DIAGNOSIS — Z7982 Long term (current) use of aspirin: Secondary | ICD-10-CM | POA: Insufficient documentation

## 2016-02-03 DIAGNOSIS — I10 Essential (primary) hypertension: Secondary | ICD-10-CM | POA: Diagnosis not present

## 2016-02-03 DIAGNOSIS — E782 Mixed hyperlipidemia: Secondary | ICD-10-CM | POA: Diagnosis not present

## 2016-02-03 LAB — BASIC METABOLIC PANEL
ANION GAP: 6 (ref 5–15)
BUN: 16 mg/dL (ref 6–20)
CHLORIDE: 107 mmol/L (ref 101–111)
CO2: 30 mmol/L (ref 22–32)
Calcium: 8.8 mg/dL — ABNORMAL LOW (ref 8.9–10.3)
Creatinine, Ser: 1.04 mg/dL — ABNORMAL HIGH (ref 0.44–1.00)
GFR calc Af Amer: 57 mL/min — ABNORMAL LOW (ref >60)
GFR, EST NON AFRICAN AMERICAN: 49 mL/min — AB (ref >60)
Glucose, Bld: 137 mg/dL — ABNORMAL HIGH (ref 65–99)
POTASSIUM: 4.3 mmol/L (ref 3.5–5.1)
SODIUM: 143 mmol/L (ref 135–145)

## 2016-02-03 LAB — CBC
HEMATOCRIT: 40.8 % (ref 36.0–46.0)
Hemoglobin: 13 g/dL (ref 12.0–15.0)
MCH: 31 pg (ref 26.0–34.0)
MCHC: 31.9 g/dL (ref 30.0–36.0)
MCV: 97.4 fL (ref 78.0–100.0)
Platelets: 199 10*3/uL (ref 150–400)
RBC: 4.19 MIL/uL (ref 3.87–5.11)
RDW: 12.2 % (ref 11.5–15.5)
WBC: 10.2 10*3/uL (ref 4.0–10.5)

## 2016-02-03 LAB — TROPONIN I

## 2016-02-03 LAB — BRAIN NATRIURETIC PEPTIDE: B Natriuretic Peptide: 70 pg/mL (ref 0.0–100.0)

## 2016-02-03 MED ORDER — DILTIAZEM HCL 100 MG IV SOLR
5.0000 mg/h | INTRAVENOUS | Status: DC
  Administered 2016-02-03: 5 mg/h via INTRAVENOUS

## 2016-02-03 MED ORDER — DILTIAZEM LOAD VIA INFUSION
20.0000 mg | Freq: Once | INTRAVENOUS | Status: AC
  Administered 2016-02-03: 20 mg via INTRAVENOUS
  Filled 2016-02-03: qty 20

## 2016-02-03 MED ORDER — DILTIAZEM HCL 100 MG IV SOLR
INTRAVENOUS | Status: AC
  Filled 2016-02-03: qty 100

## 2016-02-03 NOTE — ED Notes (Signed)
Sob since January.  Has seen PCP for same.  Also bilateral shoulder and arm pain for "a great while"

## 2016-02-03 NOTE — Discharge Instructions (Signed)
Do NOT take prednisone (steroids) anymore Please take your metoprolol immediately Return to the ER for recurrent or worsening palpitations / chest pain or difficutly breathing.

## 2016-02-03 NOTE — ED Provider Notes (Signed)
CSN: [id number]     Arrival date & time 02/03/16  4627 History  By signing my name below, I, Ronney Lion, attest that this documentation has been prepared under the direction and in the presence of Eber Hong, MD. Electronically Signed: Ronney Lion, ED Scribe. 02/03/2016. 10:36 AM.   Chief Complaint  Patient presents with  . Shortness of Breath   The history is provided by the patient and medical records. No language interpreter was used.    HPI Comments: Courtney Berry is a 82 y.o. female with a history of mixed hyperlipidemia and hypertension, who presents to the Emergency Department complaining of moderate, worsening, SOB since January 2017, about 2 months ago. Patient states she feels mild SOB presently, which occurs all the time, but which this morning acutely worsened. She notes occasional ankle swelling after walking for long periods of time. Patient states she is currently taking metoprolol, rosuvastatin, and lisinopril.  Per medical records, patient last saw her cardiologist Dr. Diona Browner in June 2016. She had brief palpitations which were never formally diagnosed, per records. Patient had an echo on April 2015 that showed normal EF, grade 1 diastolic dysfunction. Patient has been given a beta blocker to prevent palpitations but has never been diagnosed with A-fib. She denies cough, fever, vomiting, or diarrhea.    Past Medical History  Diagnosis Date  . Mixed hyperlipidemia   . Essential hypertension, benign   . History of chest pain     Low risk Cardiolite 2010  . Diverticulosis    Past Surgical History  Procedure Laterality Date  . Total abdominal hysterectomy  1969   Family History  Problem Relation Age of Onset  . Cancer Mother   . Heart disease Sister     CABG age 69  . Cancer Father   . Cancer Brother    Social History  Substance Use Topics  . Smoking status: Never Smoker   . Smokeless tobacco: Never Used     Comment: Tobacco use-no  . Alcohol Use: No   OB  History    No data available     Review of Systems  Constitutional: Negative for fever.  Respiratory: Positive for shortness of breath. Negative for cough.   Cardiovascular: Positive for palpitations. Negative for leg swelling.  Gastrointestinal: Negative for vomiting and diarrhea.  All other systems reviewed and are negative.     Allergies  Darvocet; Nitrofurantoin monohyd macro; Plavix; Sulfa antibiotics; and Penicillins  Home Medications   Prior to Admission medications   Medication Sig Start Date End Date Taking? Authorizing Provider  acetaminophen (TYLENOL) 500 MG tablet Take 1,000 mg by mouth every 6 (six) hours as needed for moderate pain.   Yes Historical Provider, MD  aspirin EC 81 MG tablet Take 81 mg by mouth daily.   Yes Historical Provider, MD  fluticasone (FLONASE) 50 MCG/ACT nasal spray Place 1-2 sprays into both nostrils as needed. 02/02/14  Yes Historical Provider, MD  lisinopril (PRINIVIL,ZESTRIL) 10 MG tablet Take 10 mg by mouth daily.   Yes Historical Provider, MD  meloxicam (MOBIC) 15 MG tablet Take 15 mg by mouth daily.   Yes Historical Provider, MD  metoprolol succinate (TOPROL-XL) 25 MG 24 hr tablet Take 1 tablet (25 mg total) by mouth daily. 03/09/13  Yes Rollene Rotunda, MD  rosuvastatin (CRESTOR) 5 MG tablet Take 5 mg by mouth daily.   Yes Historical Provider, MD  VITAMIN D, CHOLECALCIFEROL, PO Take 1 tablet by mouth daily.   Yes Historical Provider, MD  BP 146/64 mmHg  Pulse 66  Temp(Src) 98.2 F (36.8 C) (Oral)  Resp 26  Ht 5\' 8"  (1.727 m)  Wt 207 lb (93.895 kg)  BMI 31.48 kg/m2  SpO2 96% Physical Exam  Constitutional: She appears well-developed and well-nourished. No distress.  HENT:  Head: Normocephalic and atraumatic.  Mouth/Throat: Oropharynx is clear and moist. No oropharyngeal exudate.  Eyes: Conjunctivae and EOM are normal. Pupils are equal, round, and reactive to light. Right eye exhibits no discharge. Left eye exhibits no discharge. No  scleral icterus.  Neck: Normal range of motion. Neck supple. No JVD present. No thyromegaly present.  Cardiovascular: Normal heart sounds and intact distal pulses.  An irregularly irregular rhythm present. Tachycardia present.  Exam reveals no gallop and no friction rub.   No murmur heard. Tachycardic, atrial fibrillation  Pulmonary/Chest: Effort normal and breath sounds normal. No respiratory distress. She has no wheezes. She has no rales.  Abdominal: Soft. Bowel sounds are normal. She exhibits no distension and no mass. There is no tenderness.  Musculoskeletal: Normal range of motion. She exhibits edema. She exhibits no tenderness.  Scant edema on the right, none on the left  Lymphadenopathy:    She has no cervical adenopathy.  Neurological: She is alert. Coordination normal.  Skin: Skin is warm and dry. No rash noted. No erythema.  Psychiatric: She has a normal mood and affect. Her behavior is normal.  Nursing note and vitals reviewed.   ED Course  Procedures (including critical care time)  DIAGNOSTIC STUDIES: Oxygen Saturation is 96% on RA, normal by my interpretation.    COORDINATION OF CARE: 10:02 AM - Discussed treatment plan with pt at bedside which includes treatment for atrial fibrillation. Pt verbalized understanding and agreed to plan.   Labs Review Labs Reviewed  BASIC METABOLIC PANEL - Abnormal; Notable for the following:    Glucose, Bld 137 (*)    Creatinine, Ser 1.04 (*)    Calcium 8.8 (*)    GFR calc non Af Amer 49 (*)    GFR calc Af Amer 57 (*)    All other components within normal limits  CBC  TROPONIN I  BRAIN NATRIURETIC PEPTIDE    Imaging Review Dg Chest 2 View  02/03/2016  CLINICAL DATA:  Shortness of Breath EXAM: CHEST  2 VIEW COMPARISON:  None. FINDINGS: There is no edema or consolidation. The heart size and pulmonary vascularity are normal. No adenopathy. No bone lesions. IMPRESSION: No edema or consolidation. Electronically Signed   By: 02/05/2016 III M.D.   On: 02/03/2016 10:58   I have personally reviewed and evaluated these images and lab results as part of my medical decision-making.   EKG Interpretation   Date/Time:  Sunday February 03 2016 09:54:21 EDT Ventricular Rate:  135 PR Interval:    QRS Duration: 82 QT Interval:  294 QTC Calculation: 441 R Axis:   50 Text Interpretation:  Atrial fibrillation with rapid V-rate Ventricular  premature complex Low voltage, precordial leads Repolarization  abnormality, prob rate related Baseline wander in lead(s) V1 no prior ECG  in the system Confirmed by Kirk Sampley  MD, Kaytelyn Glore (01-07-1976) on 02/03/2016 10:10:41  AM      MDM   Final diagnoses:  Paroxysmal atrial fibrillation (HCC)    I personally performed the services described in this documentation, which was scribed in my presence. The recorded information has been reviewed and is accurate.    The pt had ongoing sx of SOB until after being on the  cardizem she converted to NSR - she now states that her PCP has had her on Prednisone recently due to shoulder pain and arthritis - the pt has stopped taking this b/c of the palpitations.  On repeat exam she has no SOB and is now in NSR - she has normal VS, labs reassuring - the pt can go home - she has not had her daily BB, she has been instructed to take this.  Cardiology f/u this week.  ED ECG REPORT  I personally interpreted this EKG   Date: 02/03/2016   Rate: 68  Rhythm: normal sinus rhythm  QRS Axis: normal  Intervals: normal  ST/T Wave abnormalities: normal  Conduction Disutrbances:none  Narrative Interpretation:   Old EKG Reviewed: changes noted - afib has resolved     Eber Hong, MD 02/03/16 1233

## 2016-02-03 NOTE — ED Notes (Signed)
Pt in NSR rate in the 60's. Repeat EKG done.  Dr. Hyacinth Meeker notifed.  Orders to D/C cardizem.

## 2016-02-04 ENCOUNTER — Telehealth: Payer: Self-pay | Admitting: Cardiology

## 2016-02-04 NOTE — Telephone Encounter (Signed)
Courtney Berry was seen in the ER at [facility] on Sunday for Atrial Fib -was told to see Dr. Diona Browner this week.

## 2016-02-06 NOTE — Telephone Encounter (Addendum)
Patient was contacted on 02/04/16 and scheduled for an appointment with Diona Browner on 03/05/16. Patient said that she felt fine and was okay with waiting to see Diona Browner on 03/05/16. Patient was advised to contact our office for a sooner appointment if her symptoms returned or report to the ED if her symptoms got worse. Patient verbalized understanding of plan.

## 2016-02-06 NOTE — Telephone Encounter (Signed)
-----   Message from Jonelle Sidle, MD sent at 02/05/2016  8:39 PM EDT ----- Regarding: RE: paroxysmal afib pt Noted. Reviewed ECG which showed atrial fibrillation with RVR. CHADSVASC score is 3 so will need to discuss anticoagulation with DOAC such as Eliquis or Xarelto. Depending on frequency of palpitations may also need to talk about antiarrhythmic since already on beta blocker.  ----- Message -----    From: Eustace Moore, LPN    Sent: 5/67/0141   1:51 PM      To: Eber Hong, MD, Jonelle Sidle, MD Subject: RE: paroxysmal afib pt                         Dr. Diona Browner is out of the office this week; however, I am his nurse and will take of this patient for you. I will make sure he sees this upon his arrival back to the office.  Thanks Carlye Grippe LPN ----- Message -----    From: Eber Hong, MD    Sent: 02/03/2016  12:31 PM      To: Jonelle Sidle, MD Subject: paroxysmal afib pt                             Hi Sam, this is Eber Hong in the ER,  I saw Mrs. Cordell today for palpitations and shortness of breath. It does appear that she has had palpitations intermittently but has not been identified to be a definite source. Her EKG showed atrial fibrillation with rapid ventricular rate, she was started on Cardizem because of the rate, she did not look unstable, her labs otherwise were unremarkable as was her chest x-ray. She improved and converted to normal sinus rhythm which resolved her SOB. She is already on metoprolol XL 25mg  daily, but had not yet had it this morning. I have asked her to follow-up closely in the office, thanks  

## 2016-02-08 DIAGNOSIS — M1990 Unspecified osteoarthritis, unspecified site: Secondary | ICD-10-CM | POA: Diagnosis not present

## 2016-02-08 DIAGNOSIS — I4891 Unspecified atrial fibrillation: Secondary | ICD-10-CM | POA: Diagnosis not present

## 2016-02-14 ENCOUNTER — Ambulatory Visit (INDEPENDENT_AMBULATORY_CARE_PROVIDER_SITE_OTHER): Payer: Medicare Other | Admitting: Cardiology

## 2016-02-14 ENCOUNTER — Encounter: Payer: Self-pay | Admitting: Cardiology

## 2016-02-14 VITALS — BP 152/78 | HR 83 | Ht 68.0 in | Wt 219.0 lb

## 2016-02-14 DIAGNOSIS — R002 Palpitations: Secondary | ICD-10-CM

## 2016-02-14 DIAGNOSIS — Z7901 Long term (current) use of anticoagulants: Secondary | ICD-10-CM

## 2016-02-14 DIAGNOSIS — I10 Essential (primary) hypertension: Secondary | ICD-10-CM | POA: Diagnosis not present

## 2016-02-14 DIAGNOSIS — I4891 Unspecified atrial fibrillation: Secondary | ICD-10-CM | POA: Diagnosis not present

## 2016-02-14 DIAGNOSIS — I4819 Other persistent atrial fibrillation: Secondary | ICD-10-CM | POA: Insufficient documentation

## 2016-02-14 DIAGNOSIS — E785 Hyperlipidemia, unspecified: Secondary | ICD-10-CM

## 2016-02-14 MED ORDER — APIXABAN 5 MG PO TABS: 5.0000 mg | ORAL_TABLET | Freq: Two times a day (BID) | ORAL | Status: DC

## 2016-02-14 MED ORDER — LISINOPRIL 20 MG PO TABS: 20.0000 mg | ORAL_TABLET | Freq: Every day | ORAL | Status: DC

## 2016-02-14 NOTE — Assessment & Plan Note (Signed)
CHADs2 VASc=3. Eliquis started, ASA stopped.

## 2016-02-14 NOTE — Assessment & Plan Note (Signed)
B/P elevated, ACE increased

## 2016-02-14 NOTE — Patient Instructions (Addendum)
Your physician recommends that you schedule a follow-up appointment in: 2-3 weeks with Dr. Diona Browner  Your physician has recommended you make the following change in your medication:   Start Taking Eliquis 5 mg Two Times Daily  Stop Taking Aspirin when you start the Eliquis  Stop Taking Crestor  Increase Lisinopril to 20 mg Daily   Your physician recommends that you return for lab work on the day of your ECHO.    Your physician has requested that you have an echocardiogram. Echocardiography is a painless test that uses sound waves to create images of your heart. It provides your doctor with information about the size and shape of your heart and how well your heart's chambers and valves are working. This procedure takes approximately one hour. There are no restrictions for this procedure.  You have been given samples of Eliquis today.   If you need a refill on your cardiac medications before your next appointment, please call your pharmacy.  Thank you for choosing [facility]!

## 2016-02-14 NOTE — Assessment & Plan Note (Signed)
She is having significant should pain (both shoulder). Will try a statin holiday.

## 2016-02-14 NOTE — Assessment & Plan Note (Signed)
She had been on a steroid dose pack- ? If this contributed.  NSR today in the office

## 2016-02-14 NOTE — Progress Notes (Signed)
02/14/2016 Courtney Berry   1933/06/21  [id number]  Primary Physician Remus Loffler, PA-C Primary Cardiologist: Dr Diona Browner  HPI:  Pleasant 82 y/o female followed by Dr Diona Browner in [place]. She has a history of HTN, palpitations, and dyslipidemia. Echo in 2015 showed normal LVF with mild MR, mild LVH. Recently she had been complaining of shoulder pain and was placed on a steroid dose pack by her PCP. She presented to the ED at [facility] 02/03/16 SOB. She admitted to me she could tell she had tachycardia that morning while doing the dishes. In the ED she was in AF with RVR. She converted to NSR after IV Diltiazem.  She is in the office today for follow up. The ED suggested she stop her dose pack but no other medication changes were made. She is in NSR today.    Current Outpatient Prescriptions  Medication Sig Dispense Refill  . acetaminophen (TYLENOL) 500 MG tablet Take 1,000 mg by mouth every 6 (six) hours as needed for moderate pain.    . fluticasone (FLONASE) 50 MCG/ACT nasal spray Place 1-2 sprays into both nostrils as needed.    . meloxicam (MOBIC) 15 MG tablet Take 15 mg by mouth daily.    . metoprolol succinate (TOPROL-XL) 25 MG 24 hr tablet Take 1 tablet (25 mg total) by mouth daily. 30 tablet 3  . VITAMIN D, CHOLECALCIFEROL, PO Take 1 tablet by mouth daily.    Marland Kitchen apixaban (ELIQUIS) 5 MG TABS tablet Take 1 tablet (5 mg total) by mouth 2 (two) times daily. 60 tablet 11  . lisinopril (PRINIVIL,ZESTRIL) 20 MG tablet Take 1 tablet (20 mg total) by mouth daily. 30 tablet 11   No current facility-administered medications for this visit.    Allergies  Allergen Reactions  . Darvocet [Propoxyphene N-Acetaminophen] Other (See Comments)    unknown  . Nitrofurantoin Monohyd Macro Other (See Comments)    unknown  . Plavix [Clopidogrel Bisulfate] Other (See Comments)    ???  . Sulfa Antibiotics Other (See Comments)    "face got puffy"  . Penicillins Rash    Has patient had a PCN reaction  causing immediate rash, facial/tongue/throat swelling, SOB or lightheadedness with hypotension: No no Has patient had a PCN reaction causing severe rash involving mucus membranes or skin necrosis: No no Has patient had a PCN reaction that required hospitalization No Has patient had a PCN reaction occurring within the last 10 years: No If all of the above answers are "NO", then may proceed with Cephalosporin use.     Social History   Social History  . Marital Status: Married    Spouse Name: N/A  . Number of Children: N/A  . Years of Education: N/A   Occupational History  . Retired    Social History Main Topics  . Smoking status: Never Smoker   . Smokeless tobacco: Never Used     Comment: Tobacco use-no  . Alcohol Use: No  . Drug Use: No  . Sexual Activity: Not on file   Other Topics Concern  . Not on file   Social History Narrative     Review of Systems: General: negative for chills, fever, night sweats or weight changes.  Cardiovascular: negative for chest pain, dyspnea on exertion, edema, orthopnea, palpitations, paroxysmal nocturnal dyspnea or shortness of breath Dermatological: negative for rash Respiratory: negative for cough or wheezing Urologic: negative for hematuria Abdominal: negative for nausea, vomiting, diarrhea, bright red blood per rectum, melena, or hematemesis Neurologic: negative  for visual changes, syncope, or dizziness All other systems reviewed and are otherwise negative except as noted above.    Blood pressure 152/78, pulse 83, height 5\' 8"  (1.727 m), weight 219 lb (99.338 kg), SpO2 97 %.  General appearance: alert, cooperative and no distress Lungs: clear to auscultation bilaterally Heart: regular rate and rhythm Extremities: no edema Skin: Skin color, texture, turgor normal. No rashes or lesions Neurologic: Grossly normal  EKG NSR  ASSESSMENT AND PLAN:   Atrial fibrillation, new onset (HCC) She had been on a steroid dose pack- ? If this  contributed.  NSR today in the office  Chronic anticoagulation CHADs2 VASc=3. Eliquis started, ASA stopped.  Essential hypertension B/P elevated, ACE increased  Dyslipidemia She is having significant should pain (both shoulder). Will try a statin holiday.   PLAN  I increased her Lisinopril to 20 mg for her HTN. I suggested she stop her Crestor for a few weeks to see if her shoulder discomfort improved. I discussed her case with Dr - start Eliquis (CHADs2VASc=3).  Will check echo and have her f/u in [place].   [place] K PA-C 02/14/2016 4:31 PM

## 2016-02-19 ENCOUNTER — Telehealth: Payer: Self-pay | Admitting: Cardiology

## 2016-02-19 NOTE — Telephone Encounter (Signed)
Crestor did help arm and shoulder pain,patient is going to go see rheumatologist with daughter.She states she never had steroids from pcp because she was on blood thinner.Do you have any other suggestions ?

## 2016-02-19 NOTE — Telephone Encounter (Signed)
Patient's daughter would like to speak with nurse regarding patient having pain and shoulders and arms. Please call back. / tg

## 2016-02-19 NOTE — Telephone Encounter (Signed)
I spoke with daughter and relayed Mr Wynelle Link message

## 2016-02-19 NOTE — Telephone Encounter (Signed)
It may take longer off Crestor to see if that will help- she's only been off it a few days. If her shoulder pain is unchanged after two weeks she should resume Crestor. F/U with her PCP about shoulder pain.  Corine Shelter PA-C 02/19/2016 10:28 AM

## 2016-02-21 ENCOUNTER — Other Ambulatory Visit (HOSPITAL_COMMUNITY)
Admission: RE | Admit: 2016-02-21 | Discharge: 2016-02-21 | Disposition: A | Discharge status: Home / Self Care | Payer: Medicare Other | Source: Ambulatory Visit | Attending: Cardiology | Admitting: Cardiology

## 2016-02-21 ENCOUNTER — Ambulatory Visit (HOSPITAL_COMMUNITY)
Admission: RE | Admit: 2016-02-21 | Discharge: 2016-02-21 | Disposition: A | Discharge status: Home / Self Care | Payer: Medicare Other | Source: Ambulatory Visit | Attending: Cardiology | Admitting: Cardiology

## 2016-02-21 DIAGNOSIS — I340 Nonrheumatic mitral (valve) insufficiency: Secondary | ICD-10-CM | POA: Diagnosis not present

## 2016-02-21 DIAGNOSIS — E785 Hyperlipidemia, unspecified: Secondary | ICD-10-CM | POA: Insufficient documentation

## 2016-02-21 DIAGNOSIS — I515 Myocardial degeneration: Secondary | ICD-10-CM | POA: Insufficient documentation

## 2016-02-21 DIAGNOSIS — I119 Hypertensive heart disease without heart failure: Secondary | ICD-10-CM | POA: Insufficient documentation

## 2016-02-21 DIAGNOSIS — I4891 Unspecified atrial fibrillation: Secondary | ICD-10-CM | POA: Insufficient documentation

## 2016-02-21 DIAGNOSIS — I071 Rheumatic tricuspid insufficiency: Secondary | ICD-10-CM | POA: Insufficient documentation

## 2016-02-21 DIAGNOSIS — R002 Palpitations: Secondary | ICD-10-CM | POA: Diagnosis not present

## 2016-02-21 DIAGNOSIS — Z7901 Long term (current) use of anticoagulants: Secondary | ICD-10-CM | POA: Insufficient documentation

## 2016-02-21 DIAGNOSIS — I350 Nonrheumatic aortic (valve) stenosis: Secondary | ICD-10-CM | POA: Diagnosis not present

## 2016-02-21 DIAGNOSIS — I358 Other nonrheumatic aortic valve disorders: Secondary | ICD-10-CM | POA: Diagnosis not present

## 2016-02-21 LAB — BASIC METABOLIC PANEL
Anion gap: 8 (ref 5–15)
BUN: 14 mg/dL (ref 6–20)
CO2: 29 mmol/L (ref 22–32)
Calcium: 9 mg/dL (ref 8.9–10.3)
Chloride: 104 mmol/L (ref 101–111)
Creatinine, Ser: 0.91 mg/dL (ref 0.44–1.00)
GFR calc Af Amer: >60 mL/min (ref >60)
GFR calc non Af Amer: 58 mL/min — ABNORMAL LOW (ref >60)
Glucose, Bld: 90 mg/dL (ref 65–99)
Potassium: 4 mmol/L (ref 3.5–5.1)
Sodium: 141 mmol/L (ref 135–145)

## 2016-02-21 LAB — TSH: TSH: 2.24 u[IU]/mL (ref 0.350–4.500)

## 2016-03-04 ENCOUNTER — Ambulatory Visit: Payer: Medicare Other | Admitting: Cardiology

## 2016-03-05 ENCOUNTER — Ambulatory Visit: Payer: Medicare Other | Admitting: Cardiology

## 2016-03-11 ENCOUNTER — Encounter: Payer: Self-pay | Admitting: Cardiology

## 2016-03-11 ENCOUNTER — Ambulatory Visit (INDEPENDENT_AMBULATORY_CARE_PROVIDER_SITE_OTHER): Payer: Medicare Other | Admitting: Cardiology

## 2016-03-11 ENCOUNTER — Ambulatory Visit: Payer: Medicare Other | Admitting: Cardiology

## 2016-03-11 VITALS — BP 131/71 | HR 77 | Ht 68.0 in | Wt 216.0 lb

## 2016-03-11 DIAGNOSIS — I10 Essential (primary) hypertension: Secondary | ICD-10-CM | POA: Diagnosis not present

## 2016-03-11 DIAGNOSIS — I480 Paroxysmal atrial fibrillation: Secondary | ICD-10-CM

## 2016-03-11 DIAGNOSIS — M1990 Unspecified osteoarthritis, unspecified site: Secondary | ICD-10-CM

## 2016-03-11 DIAGNOSIS — E785 Hyperlipidemia, unspecified: Secondary | ICD-10-CM | POA: Diagnosis not present

## 2016-03-11 MED ORDER — APIXABAN 5 MG PO TABS: 5.0000 mg | ORAL_TABLET | Freq: Two times a day (BID) | ORAL | Status: DC

## 2016-03-11 NOTE — Progress Notes (Signed)
Cardiology Office Note  Date: 03/11/2016   ID: Courtney Berry, DOB 1932/12/26, MRN [id number]  PCP: Remus Loffler, PA-C  Primary Cardiologist: Nona Dell, MD   Chief Complaint  Patient presents with  . PAF    History of Present Illness: Courtney Berry is an 82 y.o. female last seen by Mr. Lenn Cal in March. She was diagnosed with atrial fibrillation at ER visit in March, noted in the context of shortness of breath and a recent course of steroids for shoulder pain and arthritis. She was started on Eliquis for stroke prophylaxis with CHADSVASC score of 3, continued on Toprol-XL. At her most recent office visit she was in sinus rhythm.  She is here today with her daughter for a follow-up visit. She reports no unusual breathlessness or palpitations. We reviewed her medications. She continues on Eliquis without bleeding problems, also Toprol-XL and lisinopril.  Follow-up echocardiogram is outlined below revealing normal LVEF and no major valvular abnormalities. We discussed the results today.  She does mention that her co-pay for Eliquis is quite high, they are calling their insurance company to see if there are other alternatives better covered. We also are giving her information on the assistance program.  Past Medical History  Diagnosis Date  . Mixed hyperlipidemia   . Essential hypertension, benign   . History of chest pain     Low risk Cardiolite 2010  . Diverticulosis   . PAF (paroxysmal atrial fibrillation) [facility])     Past Surgical History  Procedure Laterality Date  . Total abdominal hysterectomy  1969    Current Outpatient Prescriptions  Medication Sig Dispense Refill  . acetaminophen (TYLENOL) 500 MG tablet Take 1,000 mg by mouth every 6 (six) hours as needed for moderate pain.    Marland Kitchen apixaban (ELIQUIS) 5 MG TABS tablet Take 1 tablet (5 mg total) by mouth 2 (two) times daily. 60 tablet 3  . fluticasone (FLONASE) 50 MCG/ACT nasal spray Place 1-2 sprays into  both nostrils as needed.    Marland Kitchen lisinopril (PRINIVIL,ZESTRIL) 20 MG tablet Take 1 tablet (20 mg total) by mouth daily. 30 tablet 11  . meloxicam (MOBIC) 15 MG tablet Take 15 mg by mouth daily.    . metoprolol succinate (TOPROL-XL) 25 MG 24 hr tablet Take 1 tablet (25 mg total) by mouth daily. 30 tablet 3  . VITAMIN D, CHOLECALCIFEROL, PO Take 1 tablet by mouth daily.     No current facility-administered medications for this visit.   Allergies:  Darvocet; Nitrofurantoin monohyd macro; Plavix; Sulfa antibiotics; and Penicillins   Social History: The patient  reports that she has never smoked. She has never used smokeless tobacco. She reports that she does not drink alcohol or use illicit drugs.   Family History: The patient's family history includes Cancer in her brother, father, and mother; Heart disease in her sister.   ROS:  Please see the history of present illness. Otherwise, complete review of systems is positive for arthritic pains.  All other systems are reviewed and negative.   Physical Exam: VS:  BP 131/71 mmHg  Pulse 77  Ht 5\' 8"  (1.727 m)  Wt 216 lb (97.977 kg)  BMI 32.85 kg/m2  SpO2 95%, BMI Body mass index is 32.85 kg/(m^2).  Wt Readings from Last 3 Encounters:  03/11/16 216 lb (97.977 kg)  02/14/16 219 lb (99.338 kg)  02/03/16 207 lb (93.895 kg)    Obese woman, appears comfortable at rest.  HEENT: Conjunctiva and lids normal, oropharynx  clear.  Neck: Supple, no elevated JVP or carotid bruits, no thyromegaly.  Lungs: Clear to auscultation, nonlabored breathing at rest.  Cardiac: Regular rate and rhythm, no S3, soft systolic murmur, no pericardial rub.  Abdomen: Soft, nontender, bowel sounds present, no guarding or rebound.  Extremities: Ankle edema, distal pulses 1-2+.  Skin: Warm and dry. Musculoskeletal: No kyphosis or Neuropsychiatric: Alert and oriented 3, affect appropriate.  ECG: I personally reviewed the recent tracing from 02/14/2016 which showed normal  sinus rhythm with nonspecific T-wave changes.  Recent Labwork: 02/03/2016: B Natriuretic Peptide 70.0; Hemoglobin 13.0; Platelets 199 02/21/2016: BUN 14; Creatinine, Ser 0.91; Potassium 4.0; Sodium 141; TSH 2.240   Other Studies Reviewed Today:  Echocardiogram 02/21/2016: Study Conclusions  - Left ventricle: The cavity size was normal. Wall thickness was  increased in a pattern of moderate LVH. Systolic function was  normal. The estimated ejection fraction was in the range of 60%  to 65%. Wall motion was normal; there were no regional wall  motion abnormalities. Doppler parameters are consistent with  abnormal left ventricular relaxation (grade 1 diastolic  dysfunction). Doppler parameters are consistent with high  ventricular filling pressure. - Aortic valve: Moderately calcified annulus. Trileaflet. - Mitral valve: Calcified annulus. There was trivial regurgitation. - Right atrium: Central venous pressure (est): 3 mm Hg. - Tricuspid valve: There was trivial regurgitation. - Pulmonary arteries: Systolic pressure could not be accurately  estimated. - Pericardium, extracardiac: A prominent pericardial fat pad was  present.  Impressions:  - Moderate LVH with LVEF 60-65% and grade 1 diastolic dysfunction.  Increased LV filling pressure. MAC with trivial mitral  regurgitation. Sclerotic aortic valve without stenosis. Trivial  tricuspid regurgitation. Unable to estimate PASP.  Assessment and Plan:  1. Paroxysmal atrial fibrillation. Heart rate is regular today and she is asymptomatic. Plan is to continue Toprol-XL and Eliquis. Follow-up scheduled in 4 months with CBC and BMET. May look into other anticoagulants depending on cost with her insurance provider.  2. Essential hypertension, continue current dose of lisinopril.  3. Hyperlipidemia, managed by diet. Keep follow-up with PCP.  4. History of arthritis with recurring flareup. She has a rheumatology visit scheduled  for evaluation in [place]. Not clear to me whether steroids necessarily precipitated her PAF, I would not say that she cannot use steroid treatments in the future if felt to be clinically indicated.  Current medicines were reviewed with the patient today.   Orders Placed This Encounter  Procedures  . CBC  . Basic metabolic panel    Disposition: FU with me in 4 months.   Signed, Jonelle Sidle, MD, [facility] 03/11/2016 1:50 PM    [facility] at [facility] [address] [place], [place], [place] [address] Phone: [phone]; Fax: [phone]

## 2016-03-11 NOTE — Patient Instructions (Signed)
Your physician recommends that you schedule a follow-up appointment in: 4 months with Dr. Diona Browner  Your physician recommends that you continue on your current medications as directed. Please refer to the Current Medication list given to you today.  Your physician recommends that you return for lab work JUST PRIOR TO YOUR NEXT VISIT CBC.BMP  Thank you for choosing [facility]!!

## 2016-03-12 DIAGNOSIS — Z79899 Other long term (current) drug therapy: Secondary | ICD-10-CM | POA: Diagnosis not present

## 2016-03-12 DIAGNOSIS — M25441 Effusion, right hand: Secondary | ICD-10-CM | POA: Diagnosis not present

## 2016-03-12 DIAGNOSIS — M25442 Effusion, left hand: Secondary | ICD-10-CM | POA: Diagnosis not present

## 2016-03-12 DIAGNOSIS — M25562 Pain in left knee: Secondary | ICD-10-CM | POA: Diagnosis not present

## 2016-03-12 DIAGNOSIS — M25511 Pain in right shoulder: Secondary | ICD-10-CM | POA: Diagnosis not present

## 2016-03-12 DIAGNOSIS — M25512 Pain in left shoulder: Secondary | ICD-10-CM | POA: Diagnosis not present

## 2016-03-12 DIAGNOSIS — M25561 Pain in right knee: Secondary | ICD-10-CM | POA: Diagnosis not present

## 2016-03-13 ENCOUNTER — Telehealth: Payer: Self-pay | Admitting: Cardiology

## 2016-03-13 NOTE — Telephone Encounter (Signed)
I will provide patient with one month supply of Eliquis 5 mg and I will forward upon receipt of completed patient assistance forms, I will fax to Dr.McDowell's nurse Isabelle Course, at the [place] office.

## 2016-03-13 NOTE — Telephone Encounter (Signed)
Patient's daughter Harriette Ohara) said that the [facility] office didn't have any Eliquis 5mg  samples to give them at the patient's office visit on Monday, 4/24 so they are looking to see if we have any in our [place] office.  I confirmed that we do have samples.   I asked that she bring the completed financial assistance paperwork in at the time she comes up to pick up the samples so that we can get this paperwork back to [place] and start processing it.  Grove confirmed that she will come by tomorrow, Friday, 03/14/16 to pick up the samples and drop of the 03/16/16.

## 2016-03-14 NOTE — Telephone Encounter (Signed)
Noted and thanks.

## 2016-04-01 DIAGNOSIS — M25512 Pain in left shoulder: Secondary | ICD-10-CM | POA: Diagnosis not present

## 2016-04-01 DIAGNOSIS — M25511 Pain in right shoulder: Secondary | ICD-10-CM | POA: Diagnosis not present

## 2016-04-01 DIAGNOSIS — M25561 Pain in right knee: Secondary | ICD-10-CM | POA: Diagnosis not present

## 2016-04-01 DIAGNOSIS — M130 Polyarthritis, unspecified: Secondary | ICD-10-CM | POA: Diagnosis not present

## 2016-04-17 ENCOUNTER — Telehealth: Payer: Self-pay | Admitting: Cardiology

## 2016-04-17 NOTE — Telephone Encounter (Signed)
Pt's daughter would like to know if she can get some samples from Eliquis.

## 2016-04-17 NOTE — Telephone Encounter (Signed)
Patient daughter notified that samples are ready along with [employer] and [employer] paperwork.

## 2016-05-21 ENCOUNTER — Telehealth: Payer: Self-pay | Admitting: Cardiology

## 2016-05-21 NOTE — Telephone Encounter (Signed)
Samples of Eliquis 5mg / tg  °

## 2016-05-21 NOTE — Telephone Encounter (Signed)
3 sample boxes of Eliquis placed at front desk for patient. Lot # [id number], EXP: May 2019

## 2016-05-22 DIAGNOSIS — M19042 Primary osteoarthritis, left hand: Secondary | ICD-10-CM | POA: Diagnosis not present

## 2016-05-22 DIAGNOSIS — M19041 Primary osteoarthritis, right hand: Secondary | ICD-10-CM | POA: Diagnosis not present

## 2016-05-22 DIAGNOSIS — M25512 Pain in left shoulder: Secondary | ICD-10-CM | POA: Diagnosis not present

## 2016-05-22 DIAGNOSIS — M25511 Pain in right shoulder: Secondary | ICD-10-CM | POA: Diagnosis not present

## 2016-06-04 ENCOUNTER — Telehealth: Payer: Self-pay | Admitting: Cardiology

## 2016-06-04 NOTE — Telephone Encounter (Signed)
eliquis 5 mg sample lot [id number],  Exp 08/2018

## 2016-06-04 NOTE — Telephone Encounter (Signed)
The patients daughter, [name], called and asked to speak with Lynden Ang about questions they have regarding Eliquis. [name] did not specify what their questions were. I told her a nurse would return their call after clinic today.

## 2016-06-04 NOTE — Telephone Encounter (Signed)
1 bottle # 90 given

## 2016-06-09 DIAGNOSIS — M1990 Unspecified osteoarthritis, unspecified site: Secondary | ICD-10-CM | POA: Diagnosis not present

## 2016-06-09 DIAGNOSIS — I499 Cardiac arrhythmia, unspecified: Secondary | ICD-10-CM | POA: Diagnosis not present

## 2016-06-09 DIAGNOSIS — I10 Essential (primary) hypertension: Secondary | ICD-10-CM | POA: Diagnosis not present

## 2016-06-09 DIAGNOSIS — E785 Hyperlipidemia, unspecified: Secondary | ICD-10-CM | POA: Diagnosis not present

## 2016-07-03 DIAGNOSIS — M25441 Effusion, right hand: Secondary | ICD-10-CM | POA: Diagnosis not present

## 2016-07-03 DIAGNOSIS — M25442 Effusion, left hand: Secondary | ICD-10-CM | POA: Diagnosis not present

## 2016-07-03 DIAGNOSIS — M25562 Pain in left knee: Secondary | ICD-10-CM | POA: Diagnosis not present

## 2016-07-03 DIAGNOSIS — Z79899 Other long term (current) drug therapy: Secondary | ICD-10-CM | POA: Diagnosis not present

## 2016-07-03 DIAGNOSIS — M25511 Pain in right shoulder: Secondary | ICD-10-CM | POA: Diagnosis not present

## 2016-07-03 DIAGNOSIS — M25512 Pain in left shoulder: Secondary | ICD-10-CM | POA: Diagnosis not present

## 2016-07-03 DIAGNOSIS — M25561 Pain in right knee: Secondary | ICD-10-CM | POA: Diagnosis not present

## 2016-07-10 ENCOUNTER — Other Ambulatory Visit: Payer: Self-pay | Admitting: Cardiology

## 2016-07-10 DIAGNOSIS — I4891 Unspecified atrial fibrillation: Secondary | ICD-10-CM | POA: Diagnosis not present

## 2016-07-10 LAB — BASIC METABOLIC PANEL WITH GFR
BUN: 18 mg/dL (ref 7–25)
CO2: 30 mmol/L (ref 20–31)
Calcium: 9.3 mg/dL (ref 8.6–10.4)
Chloride: 104 mmol/L (ref 98–110)
Creat: 1.06 mg/dL — ABNORMAL HIGH (ref 0.60–0.88)
Glucose, Bld: 97 mg/dL (ref 65–99)
Potassium: 4.1 mmol/L (ref 3.5–5.3)
Sodium: 143 mmol/L (ref 135–146)

## 2016-07-10 LAB — CBC
HEMATOCRIT: 41.5 % (ref 35.0–45.0)
HEMOGLOBIN: 13.5 g/dL (ref 11.7–15.5)
MCH: 30.4 pg (ref 27.0–33.0)
MCHC: 32.5 g/dL (ref 32.0–36.0)
MCV: 93.5 fL (ref 80.0–100.0)
MPV: 13.9 fL — ABNORMAL HIGH (ref 7.5–12.5)
Platelets: 210 10*3/uL (ref 140–400)
RBC: 4.44 MIL/uL (ref 3.80–5.10)
RDW: 12.9 % (ref 11.0–15.0)
WBC: 10.2 10*3/uL (ref 3.8–10.8)

## 2016-07-11 ENCOUNTER — Other Ambulatory Visit: Payer: Self-pay | Admitting: Cardiology

## 2016-07-11 NOTE — Telephone Encounter (Signed)
Requesting samples of Eliquis 5mg / tg  °

## 2016-07-11 NOTE — Telephone Encounter (Signed)
eliquis 5 mg , 3 boxes given lot [id number] exp 07/2018

## 2016-07-14 ENCOUNTER — Telehealth: Payer: Self-pay | Admitting: *Deleted

## 2016-07-14 DIAGNOSIS — M25442 Effusion, left hand: Secondary | ICD-10-CM | POA: Diagnosis not present

## 2016-07-14 DIAGNOSIS — M25511 Pain in right shoulder: Secondary | ICD-10-CM | POA: Diagnosis not present

## 2016-07-14 DIAGNOSIS — M25441 Effusion, right hand: Secondary | ICD-10-CM | POA: Diagnosis not present

## 2016-07-14 DIAGNOSIS — Z79899 Other long term (current) drug therapy: Secondary | ICD-10-CM | POA: Diagnosis not present

## 2016-07-14 DIAGNOSIS — M25512 Pain in left shoulder: Secondary | ICD-10-CM | POA: Diagnosis not present

## 2016-07-14 DIAGNOSIS — M0609 Rheumatoid arthritis without rheumatoid factor, multiple sites: Secondary | ICD-10-CM | POA: Diagnosis not present

## 2016-07-14 NOTE — Telephone Encounter (Signed)
Patient informed and copy sent to PCP. 

## 2016-07-14 NOTE — Telephone Encounter (Signed)
-----   Message from Jonelle Sidle, MD sent at 07/11/2016  9:44 AM EDT ----- Results reviewed. Renal function is relatively stable and hemoglobin normal. Continue with current plan. A copy of this test should be forwarded to Remus Loffler, PA.

## 2016-07-15 ENCOUNTER — Encounter: Payer: Self-pay | Admitting: Cardiology

## 2016-07-15 ENCOUNTER — Ambulatory Visit (INDEPENDENT_AMBULATORY_CARE_PROVIDER_SITE_OTHER): Payer: Medicare Other | Admitting: Cardiology

## 2016-07-15 VITALS — BP 158/79 | HR 69 | Ht 68.0 in | Wt 221.8 lb

## 2016-07-15 DIAGNOSIS — I10 Essential (primary) hypertension: Secondary | ICD-10-CM

## 2016-07-15 DIAGNOSIS — I480 Paroxysmal atrial fibrillation: Secondary | ICD-10-CM

## 2016-07-15 NOTE — Patient Instructions (Signed)
Your physician wants you to follow-up in: 6 MONTHS WITH DR. MCDOWELL IN [place] You will receive a reminder letter in the mail two months in advance. If you don't receive a letter, please call our office to schedule the follow-up appointment.  Your physician recommends that you continue on your current medications as directed. Please refer to the Current Medication list given to you today.  Your physician recommends that you return for lab work JUST PRIOR TO YOUR NEXT VISIT - CBC/BMP  Thank you for choosing [facility]!!

## 2016-07-15 NOTE — Progress Notes (Signed)
Cardiology Office Note  Date: 07/15/2016   ID: Courtney Berry, DOB [birth year], MRN [id number]  PCP: Remus Loffler, PA  Primary Cardiologist: Nona Dell, MD   Chief Complaint  Patient presents with  . PAF    History of Present Illness: Courtney Berry is an 82 y.o. female last seen in April. She presents today for a routine follow-up visit. Since last encounter she has had no significant palpitations, does not report any chest pain or increasing shortness of breath.  I reviewed her recent follow-up lab work. We discussed the results today. She continues on Eliquis and Toprol-XL. No bleeding episodes.  Past Medical History:  Diagnosis Date  . Diverticulosis   . Essential hypertension, benign   . History of chest pain    Low risk Cardiolite 2010  . Mixed hyperlipidemia   . PAF (paroxysmal atrial fibrillation) (HCC)     Current Outpatient Prescriptions  Medication Sig Dispense Refill  . acetaminophen (TYLENOL) 500 MG tablet Take 1,000 mg by mouth every 6 (six) hours as needed for moderate pain.    Marland Kitchen apixaban (ELIQUIS) 5 MG TABS tablet Take 1 tablet (5 mg total) by mouth 2 (two) times daily. 60 tablet 3  . fluticasone (FLONASE) 50 MCG/ACT nasal spray Place 1-2 sprays into both nostrils as needed.    . folic acid (FOLVITE) 1 MG tablet Take 1 mg by mouth daily.    Marland Kitchen lisinopril (PRINIVIL,ZESTRIL) 20 MG tablet Take 1 tablet (20 mg total) by mouth daily. 30 tablet 11  . methotrexate (RHEUMATREX) 2.5 MG tablet 3 tabs by mouth twice a day on Tuesday's only.    . metoprolol succinate (TOPROL-XL) 25 MG 24 hr tablet Take 1 tablet (25 mg total) by mouth daily. 30 tablet 3  . predniSONE (DELTASONE) 10 MG tablet Take 1 tablet by mouth daily.     Marland Kitchen VITAMIN D, CHOLECALCIFEROL, PO Take 1 tablet by mouth daily.     No current facility-administered medications for this visit.    Allergies:  Darvocet [propoxyphene n-acetaminophen]; Nitrofurantoin monohyd macro; Plavix [clopidogrel  bisulfate]; Sulfa antibiotics; and Penicillins   Social History: The patient  reports that she has never smoked. She has never used smokeless tobacco. She reports that she does not drink alcohol or use drugs.   ROS:  Please see the history of present illness. Otherwise, complete review of systems is positive for arthritis symptoms.  All other systems are reviewed and negative.   Physical Exam: VS:  BP (!) 158/79   Pulse 69   Ht 5\' 8"  (1.727 m)   Wt 221 lb 12.8 oz (100.6 kg)   SpO2 95%   BMI 33.72 kg/m , BMI Body mass index is 33.72 kg/m.  Wt Readings from Last 3 Encounters:  07/15/16 221 lb 12.8 oz (100.6 kg)  03/11/16 216 lb (98 kg)  02/14/16 219 lb (99.3 kg)    Obese woman, appears comfortable at rest.  HEENT: Conjunctiva and lids normal, oropharynx clear.  Neck: Supple, no elevated JVP or carotid bruits, no thyromegaly.  Lungs: Clear to auscultation, nonlabored breathing at rest.  Cardiac: Regular rate and rhythm, no S3, soft systolic murmur, no pericardial rub.  Abdomen: Soft, nontender, bowel sounds present, no guarding or rebound.  Extremities: Ankle edema, distal pulses 1-2+.   ECG: I personally reviewed the tracing from 02/14/2016 which showed normal sinus rhythm with nonspecific T-wave changes.  Recent Labwork: 02/03/2016: B Natriuretic Peptide 70.0 02/21/2016: TSH 2.240 07/10/2016: BUN 18; Creat 1.06; Hemoglobin 13.5;  Platelets 210; Potassium 4.1; Sodium 143  Other Studies Reviewed Today:  Echocardiogram 02/21/2016: Study Conclusions  - Left ventricle: The cavity size was normal. Wall thickness was  increased in a pattern of moderate LVH. Systolic function was  normal. The estimated ejection fraction was in the range of 60%  to 65%. Wall motion was normal; there were no regional wall  motion abnormalities. Doppler parameters are consistent with  abnormal left ventricular relaxation (grade 1 diastolic  dysfunction). Doppler parameters are consistent with  high  ventricular filling pressure. - Aortic valve: Moderately calcified annulus. Trileaflet. - Mitral valve: Calcified annulus. There was trivial regurgitation. - Right atrium: Central venous pressure (est): 3 mm Hg. - Tricuspid valve: There was trivial regurgitation. - Pulmonary arteries: Systolic pressure could not be accurately  estimated. - Pericardium, extracardiac: A prominent pericardial fat pad was  present.  Impressions:  - Moderate LVH with LVEF 60-65% and grade 1 diastolic dysfunction.  Increased LV filling pressure. MAC with trivial mitral  regurgitation. Sclerotic aortic valve without stenosis. Trivial  tricuspid regurgitation. Unable to estimate PASP.  Assessment and Plan:  1. Paroxysmal atrial fibrillation. She is symptomatically stable at this time on Toprol-XL and Eliquis. No changes made today. We will follow-up CBC and BMET for her next visit.  2. Essential hypertension, blood pressure is elevated today. She is also on lisinopril. Keep follow-up with PCP.  Current medicines were reviewed with the patient today.  Disposition: Follow-up with me in 6 months.  Signed, Jonelle Sidle, MD, [facility] 07/15/2016 3:23 PM    [facility] at [facility] [address] [place], [place], [place] [address] Phone: [phone]; Fax: [phone]

## 2016-08-01 ENCOUNTER — Emergency Department (HOSPITAL_COMMUNITY): Payer: Medicare Other

## 2016-08-01 ENCOUNTER — Observation Stay (HOSPITAL_COMMUNITY)
Admission: EM | Admit: 2016-08-01 | Discharge: 2016-08-02 | Disposition: A | Discharge status: Home / Self Care | Payer: Medicare Other | Attending: Internal Medicine | Admitting: Internal Medicine

## 2016-08-01 ENCOUNTER — Encounter (HOSPITAL_COMMUNITY): Payer: Self-pay | Admitting: *Deleted

## 2016-08-01 DIAGNOSIS — Z7952 Long term (current) use of systemic steroids: Secondary | ICD-10-CM | POA: Diagnosis not present

## 2016-08-01 DIAGNOSIS — I10 Essential (primary) hypertension: Secondary | ICD-10-CM | POA: Diagnosis present

## 2016-08-01 DIAGNOSIS — R079 Chest pain, unspecified: Secondary | ICD-10-CM | POA: Diagnosis not present

## 2016-08-01 DIAGNOSIS — Z79899 Other long term (current) drug therapy: Secondary | ICD-10-CM | POA: Diagnosis not present

## 2016-08-01 DIAGNOSIS — R002 Palpitations: Secondary | ICD-10-CM | POA: Diagnosis present

## 2016-08-01 DIAGNOSIS — I480 Paroxysmal atrial fibrillation: Secondary | ICD-10-CM | POA: Diagnosis not present

## 2016-08-01 DIAGNOSIS — R000 Tachycardia, unspecified: Secondary | ICD-10-CM | POA: Diagnosis not present

## 2016-08-01 DIAGNOSIS — R0602 Shortness of breath: Secondary | ICD-10-CM | POA: Diagnosis present

## 2016-08-01 DIAGNOSIS — I4891 Unspecified atrial fibrillation: Secondary | ICD-10-CM | POA: Diagnosis not present

## 2016-08-01 DIAGNOSIS — R062 Wheezing: Secondary | ICD-10-CM | POA: Diagnosis not present

## 2016-08-01 DIAGNOSIS — M069 Rheumatoid arthritis, unspecified: Secondary | ICD-10-CM | POA: Diagnosis not present

## 2016-08-01 DIAGNOSIS — E785 Hyperlipidemia, unspecified: Secondary | ICD-10-CM | POA: Diagnosis present

## 2016-08-01 DIAGNOSIS — R001 Bradycardia, unspecified: Secondary | ICD-10-CM | POA: Diagnosis present

## 2016-08-01 DIAGNOSIS — Z7901 Long term (current) use of anticoagulants: Secondary | ICD-10-CM | POA: Diagnosis not present

## 2016-08-01 LAB — BASIC METABOLIC PANEL
Anion gap: 9 (ref 5–15)
BUN: 19 mg/dL (ref 6–20)
CO2: 31 mmol/L (ref 22–32)
Calcium: 9.7 mg/dL (ref 8.9–10.3)
Chloride: 101 mmol/L (ref 101–111)
Creatinine, Ser: 1.09 mg/dL — ABNORMAL HIGH (ref 0.44–1.00)
GFR calc Af Amer: 54 mL/min — ABNORMAL LOW (ref >60)
GFR calc non Af Amer: 46 mL/min — ABNORMAL LOW (ref >60)
Glucose, Bld: 127 mg/dL — ABNORMAL HIGH (ref 65–99)
Potassium: 4.1 mmol/L (ref 3.5–5.1)
Sodium: 141 mmol/L (ref 135–145)

## 2016-08-01 LAB — MRSA PCR SCREENING: MRSA BY PCR: NEGATIVE

## 2016-08-01 LAB — TROPONIN I
Troponin I: <0.03 ng/mL (ref <0.03)
Troponin I: <0.03 ng/mL (ref <0.03)

## 2016-08-01 LAB — CBC
HEMATOCRIT: 45.6 % (ref 36.0–46.0)
HEMOGLOBIN: 14.4 g/dL (ref 12.0–15.0)
MCH: 31 pg (ref 26.0–34.0)
MCHC: 31.6 g/dL (ref 30.0–36.0)
MCV: 98.3 fL (ref 78.0–100.0)
Platelets: 140 10*3/uL — ABNORMAL LOW (ref 150–400)
RBC: 4.64 MIL/uL (ref 3.87–5.11)
RDW: 12.4 % (ref 11.5–15.5)
WBC: 10.8 10*3/uL — AB (ref 4.0–10.5)

## 2016-08-01 LAB — BRAIN NATRIURETIC PEPTIDE: B Natriuretic Peptide: 28 pg/mL (ref 0.0–100.0)

## 2016-08-01 MED ORDER — FLUTICASONE PROPIONATE 50 MCG/ACT NA SUSP
2.0000 | Freq: Every day | NASAL | Status: DC
  Filled 2016-08-01 (×2): qty 16

## 2016-08-01 MED ORDER — ONDANSETRON HCL 4 MG/2ML IJ SOLN: 4.0000 mg | Freq: Four times a day (QID) | INTRAMUSCULAR | Status: DC | PRN

## 2016-08-01 MED ORDER — SODIUM CHLORIDE 0.9 % IV SOLN
INTRAVENOUS | Status: DC
  Administered 2016-08-01: 15:00:00 via INTRAVENOUS

## 2016-08-01 MED ORDER — APIXABAN 5 MG PO TABS
5.0000 mg | ORAL_TABLET | Freq: Two times a day (BID) | ORAL | Status: DC
  Administered 2016-08-01 – 2016-08-02 (×2): 5 mg via ORAL
  Filled 2016-08-01 (×8): qty 1

## 2016-08-01 MED ORDER — METOPROLOL SUCCINATE ER 25 MG PO TB24
25.0000 mg | ORAL_TABLET | Freq: Every day | ORAL | Status: DC
  Administered 2016-08-02: 25 mg via ORAL
  Filled 2016-08-01 (×5): qty 1

## 2016-08-01 MED ORDER — IPRATROPIUM-ALBUTEROL 0.5-2.5 (3) MG/3ML IN SOLN
3.0000 mL | Freq: Four times a day (QID) | RESPIRATORY_TRACT | Status: DC | PRN
  Administered 2016-08-01: 3 mL via RESPIRATORY_TRACT
  Filled 2016-08-01: qty 3

## 2016-08-01 MED ORDER — DILTIAZEM HCL 100 MG IV SOLR
5.0000 mg/h | INTRAVENOUS | Status: DC
  Administered 2016-08-01: 10 mg/h via INTRAVENOUS

## 2016-08-01 MED ORDER — FOLIC ACID 1 MG PO TABS
1.0000 mg | ORAL_TABLET | Freq: Every day | ORAL | Status: DC
  Administered 2016-08-02: 1 mg via ORAL
  Filled 2016-08-01: qty 1

## 2016-08-01 MED ORDER — ACETAMINOPHEN 325 MG PO TABS: 650.0000 mg | ORAL_TABLET | ORAL | Status: DC | PRN

## 2016-08-01 MED ORDER — DILTIAZEM LOAD VIA INFUSION
10.0000 mg | Freq: Once | INTRAVENOUS | Status: AC
  Administered 2016-08-01: 10 mg via INTRAVENOUS
  Filled 2016-08-01: qty 10

## 2016-08-01 MED ORDER — DILTIAZEM HCL 25 MG/5ML IV SOLN
15.0000 mg | Freq: Once | INTRAVENOUS | Status: AC
  Administered 2016-08-01: 15 mg via INTRAVENOUS

## 2016-08-01 MED ORDER — PREDNISONE 10 MG PO TABS
5.0000 mg | ORAL_TABLET | Freq: Every day | ORAL | Status: DC
  Administered 2016-08-02: 5 mg via ORAL
  Filled 2016-08-01 (×2): qty 1

## 2016-08-01 MED ORDER — DEXTROSE 5 % IV SOLN
5.0000 mg/h | INTRAVENOUS | Status: DC
  Administered 2016-08-01: 5 mg/h via INTRAVENOUS
  Filled 2016-08-01 (×2): qty 100

## 2016-08-01 MED ORDER — METOPROLOL TARTRATE 25 MG PO TABS
ORAL_TABLET | ORAL | Status: AC
  Administered 2016-08-01: 25 mg
  Filled 2016-08-01: qty 1

## 2016-08-01 NOTE — ED Notes (Signed)
Hospitalist notified pt's HR at decreased to 59, Cardizem drip d/c, EKG given to MD [facility].

## 2016-08-01 NOTE — ED Triage Notes (Signed)
Pt comes in with shortness of breath and central chest pain starting this morning when she got up. NAD noted.

## 2016-08-01 NOTE — ED Provider Notes (Signed)
[facility] DEPT Provider Note   CSN: [id number] Arrival date & time: 08/01/16  0836     History   Chief Complaint Chief Complaint  Patient presents with  . Chest Pain  . Shortness of Breath  . Palpitations    HPI Courtney Berry is a 82 y.o. female.  HPI Pt was seen at 0910. Per pt and her family, c/o gradual onset and worsening of persistent SOB, chest "tightness," and palpitations for the past several days, worse this morning. Pt states her symptoms were occurring when she exerted herself, and would get better with resting. Pt states she woke up this morning with these symptoms and they "haven't gone away." Denies cough, no abd pain, no N/V/D, no back pain.    Past Medical History:  Diagnosis Date  . Diverticulosis   . Essential hypertension, benign   . History of chest pain    Low risk Cardiolite 2010  . Mixed hyperlipidemia   . PAF (paroxysmal atrial fibrillation) [facility])     Patient Active Problem List   Diagnosis Date Noted  . Atrial fibrillation, new onset (HCC) 02/14/2016  . Chronic anticoagulation 02/14/2016  . Palpitations 03/02/2012  . Dyslipidemia 09/03/2009  . Essential hypertension 09/03/2009  . Shortness of breath 09/03/2009    Past Surgical History:  Procedure Laterality Date  . TOTAL ABDOMINAL HYSTERECTOMY  1969       Home Medications    Prior to Admission medications   Medication Sig Start Date End Date Taking? Authorizing Provider  acetaminophen (TYLENOL) 500 MG tablet Take 1,000 mg by mouth every 6 (six) hours as needed for moderate pain.    Historical Provider, MD  apixaban (ELIQUIS) 5 MG TABS tablet Take 1 tablet (5 mg total) by mouth 2 (two) times daily. 03/11/16   Jonelle Sidle, MD  fluticasone (FLONASE) 50 MCG/ACT nasal spray Place 1-2 sprays into both nostrils as needed. 02/02/14   Historical Provider, MD  folic acid (FOLVITE) 1 MG tablet Take 1 mg by mouth daily.    Historical Provider, MD  lisinopril (PRINIVIL,ZESTRIL) 20 MG  tablet Take 1 tablet (20 mg total) by mouth daily. 02/14/16   Abelino Derrick, PA-C  methotrexate (RHEUMATREX) 2.5 MG tablet 3 tabs by mouth twice a day on Tuesday's only. 07/14/16   Historical Provider, MD  metoprolol succinate (TOPROL-XL) 25 MG 24 hr tablet Take 1 tablet (25 mg total) by mouth daily. 03/09/13   Rollene Rotunda, MD  predniSONE (DELTASONE) 10 MG tablet Take 1 tablet by mouth daily.  07/12/16   Historical Provider, MD  VITAMIN D, CHOLECALCIFEROL, PO Take 1 tablet by mouth daily.    Historical Provider, MD    Family History Family History  Problem Relation Age of Onset  . Cancer Father   . Cancer Mother   . Heart disease Sister     CABG age 51  . Cancer Brother     Social History Social History  Substance Use Topics  . Smoking status: Never Smoker  . Smokeless tobacco: Never Used     Comment: Tobacco use-no  . Alcohol use No     Allergies   Darvocet [propoxyphene n-acetaminophen]; Nitrofurantoin monohyd macro; Plavix [clopidogrel bisulfate]; Sulfa antibiotics; and Penicillins   Review of Systems Review of Systems ROS: Statement: All systems negative except as marked or noted in the HPI; Constitutional: Negative for fever and chills. ; ; Eyes: Negative for eye pain, redness and discharge. ; ; ENMT: Negative for ear pain, hoarseness, nasal congestion, sinus pressure  and sore throat. ; ; Cardiovascular: +CP, SOB, palpitations. Negative for diaphoresis, and peripheral edema. ; ; Respiratory: Negative for cough, wheezing and stridor. ; ; Gastrointestinal: Negative for nausea, vomiting, diarrhea, abdominal pain, blood in stool, hematemesis, jaundice and rectal bleeding. . ; ; Genitourinary: Negative for dysuria, flank pain and hematuria. ; ; Musculoskeletal: Negative for back pain and neck pain. Negative for swelling and trauma.; ; Skin: Negative for pruritus, rash, abrasions, blisters, bruising and skin lesion.; ; Neuro: Negative for headache, lightheadedness and neck stiffness.  Negative for weakness, altered level of consciousness, altered mental status, extremity weakness, paresthesias, involuntary movement, seizure and syncope.       Physical Exam Updated Vital Signs BP 157/70 (BP Location: Left Arm)   Pulse 68   Temp 98 F (36.7 C) (Oral)   Resp 16   Ht 5\' 8"  (1.727 m)   Wt 220 lb (99.8 kg)   SpO2 99%   BMI 33.45 kg/m    Patient Vitals for the past 24 hrs:  BP Temp Temp src Pulse Resp SpO2 Height Weight  08/01/16 1000 154/89 - - 86 22 95 % - -  08/01/16 0928 138/88 - - (!) 145 22 98 % - -  08/01/16 0852 157/70 98 F (36.7 C) Oral 68 16 99 % 5\' 8"  (1.727 m) 220 lb (99.8 kg)     Physical Exam 0915: Physical examination:  Nursing notes reviewed; Vital signs and O2 SAT reviewed;  Constitutional: Well developed, Well nourished, Well hydrated, In no acute distress; Head:  Normocephalic, atraumatic; Eyes: EOMI, PERRL, No scleral icterus; ENMT: Mouth and pharynx normal, Mucous membranes moist; Neck: Supple, Full range of motion, No lymphadenopathy; Cardiovascular: Irregular irregular, tachycardic rate and rhythm, No gallop; Respiratory: Breath sounds clear & equal bilaterally, No wheezes.  Speaking full sentences with ease, Normal respiratory effort/excursion; Chest: Nontender, Movement normal; Abdomen: Soft, Nontender, Nondistended, Normal bowel sounds; Genitourinary: No CVA tenderness; Extremities: Pulses normal, No tenderness, No edema, No calf edema or asymmetry.; Neuro: AA&Ox3, vague historian. Major CN grossly intact.  Speech clear. No gross focal motor or sensory deficits in extremities.; Skin: Color normal, Warm, Dry.   ED Treatments / Results  Labs (all labs ordered are listed, but only abnormal results are displayed)   EKG  EKG Interpretation  Date/Time:    Ventricular Rate:  173 PR Interval:    QRS Duration: 68 QT Interval:  284 QTC Calculation: 481 R Axis:   40 Text Interpretation:  Atrial fibrillation with rapid ventricular response ST  & T wave abnormality, consider lateral ischemia When compared with ECG of 02/03/2016 Atrial fibrillation is now Present Confirmed by [facility]  MD, Nicholos JohnsKATHLEEN [phone]([id number]) on 08/01/2016 9:35:29 AM        EKG Interpretation  Date/Time:  Friday August 01 2016 09:07:07 EDT Ventricular Rate:  156 PR Interval:    QRS Duration: 72 QT Interval:  278 QTC Calculation: 448 R Axis:   39 Text Interpretation:  Atrial fibrillation with rapid V-rate Repolarization abnormality, prob rate related Since last tracing of earlier today No significant change was found Confirmed by [facility]  MD, Nicholos JohnsKATHLEEN [phone]([id number]) on 08/01/2016 9:36:13 AM          Radiology   Procedures Procedures (including critical care time)  Medications Ordered in ED Medications  diltiazem (CARDIZEM) 1 mg/mL load via infusion 10 mg (not administered)    And  diltiazem (CARDIZEM) 100 mg in dextrose 5 % 100 mL (1 mg/mL) infusion (not administered)     Initial Impression /  Assessment and Plan / ED Course  I have reviewed the triage vital signs and the nursing notes.  Pertinent labs & imaging results that were available during my care of the patient were reviewed by me and considered in my medical decision making (see chart for details).  MDM Reviewed: previous chart, vitals and nursing note Reviewed previous: labs and ECG Interpretation: labs, ECG and x-ray Total time providing critical care: 30-74 minutes. This excludes time spent performing separately reportable procedures and services. Consults: admitting MD   CRITICAL CARE Performed by: Laray Anger Total critical care time: 35 minutes Critical care time was exclusive of separately billable procedures and treating other patients. Critical care was necessary to treat or prevent imminent or life-threatening deterioration. Critical care was time spent personally by me on the following activities: development of treatment plan with patient and/or surrogate as well as  nursing, discussions with consultants, evaluation of patient's response to treatment, examination of patient, obtaining history from patient or surrogate, ordering and performing treatments and interventions, ordering and review of laboratory studies, ordering and review of radiographic studies, pulse oximetry and re-evaluation of patient's condition.  Results for orders placed or performed during the hospital encounter of 08/01/16  Basic metabolic panel  Result Value Ref Range   Sodium 141 135 - 145 mmol/L   Potassium 4.1 3.5 - 5.1 mmol/L   Chloride 101 101 - 111 mmol/L   CO2 31 22 - 32 mmol/L   Glucose, Bld 127 (H) 65 - 99 mg/dL   BUN 19 6 - 20 mg/dL   Creatinine, Ser 4.09 (H) 0.44 - 1.00 mg/dL   Calcium 9.7 8.9 - 81.1 mg/dL   GFR calc non Af Amer 46 (L) >60 mL/min   GFR calc Af Amer 54 (L) >60 mL/min   Anion gap 9 5 - 15  CBC  Result Value Ref Range   WBC 10.8 (H) 4.0 - 10.5 K/uL   RBC 4.64 3.87 - 5.11 MIL/uL   Hemoglobin 14.4 12.0 - 15.0 g/dL   HCT 91.4 78.2 - 95.6 %   MCV 98.3 78.0 - 100.0 fL   MCH 31.0 26.0 - 34.0 pg   MCHC 31.6 30.0 - 36.0 g/dL   RDW 21.3 08.6 - 57.8 %   Platelets 140 (L) 150 - 400 K/uL  Troponin I  Result Value Ref Range   Troponin I <0.03 <0.03 ng/mL  Brain natriuretic peptide  Result Value Ref Range   B Natriuretic Peptide 28.0 0.0 - 100.0 pg/mL   Dg Chest 2 View Result Date: 08/01/2016 CLINICAL DATA:  Chest pain EXAM: CHEST  2 VIEW COMPARISON:  02/03/2016 FINDINGS: Normal heart size and mediastinal contours. Chronic elevation of the right diaphragm. There is no edema, consolidation, effusion, or pneumothorax. IMPRESSION: No active cardiopulmonary disease. Electronically Signed   By: Marnee Spring M.D.   On: 08/01/2016 10:09    1040:  On arrival: pt's monitor afib/RVR with rates to 170's. IV cardizem bolus and gtt started. HR slowly trending down into 130's. Will re-bolus IV cardizem. Pt currently denies symptoms while laying on the stretcher. Dx and  testing d/w pt and family.  Questions answered.  Verb understanding, agreeable to admit.  T/C to [facility] Dr. Allena Katz, case discussed, including:  HPI, pertinent PM/SHx, VS/PE, dx testing, ED course and treatment:  Agreeable to admit, requests to write temporary orders, obtain observation stepdown bed to team [facility].     Final Clinical Impressions(s) / ED Diagnoses   Final diagnoses:  None  New Prescriptions New Prescriptions   No medications on file     Samuel Jester, DO 08/04/16 2151

## 2016-08-01 NOTE — Progress Notes (Signed)
ANTICOAGULATION CONSULT NOTE - Initial Consult  Pharmacy Consult for eliquis Indication: atrial fibrillation   Patient Measurements: Height: 5\' 8"  (172.7 cm) Weight: 220 lb (99.8 kg) IBW/kg (Calculated) : 63.9   Vital Signs: Temp: 98 F (36.7 C) (09/15 0852) Temp Source: Oral (09/15 0852) BP: 142/56 (09/15 1300) Pulse Rate: 84 (09/15 1300)  Labs:  Recent Labs  08/01/16 0906  HGB 14.4  HCT 45.6  PLT 140*  CREATININE 1.09*  TROPONINI <0.03    Estimated Creatinine Clearance: 50 mL/min (by C-G formula based on SCr of 1.09 mg/dL (H)).   Medical History: Past Medical History:  Diagnosis Date  . Diverticulosis   . Essential hypertension, benign   . History of chest pain    Low risk Cardiolite 2010  . Mixed hyperlipidemia   . PAF (paroxysmal atrial fibrillation) (HCC)     Medications:  See medication history  Assessment: 82 yo lady to continue eliquis for afib.   Goal of Therapy:  Appropriate anticoagulation Monitor platelets by anticoagulation protocol: Yes   Plan:  Cont home dose eliquis 5 mg po bid Monitor for bleeding complications  Orlyn Odonoghue Poteet 08/01/2016,1:16 PM

## 2016-08-01 NOTE — H&P (Signed)
[facility] History and Physical   Patient: Courtney Berry GEX:[id number]   PCP: Remus Loffler, PA DOB: 1932/11/30   DOA: 08/01/2016   DOS: 08/01/2016   DOS: the patient was seen and examined on 08/01/2016  Patient coming from: The patient is coming from home.  Chief Complaint: Shortness of breath  HPI: Courtney Berry is a 82 y.o. female with Past medical history of paroxysmal A. fib, HTN, chronic anticoagulation. Patient presented with complaints of shortness of breath ongoing since this morning. She also had complains of palpitation. Denies any chest pain chest heaviness or chest tightness. No nausea no vomiting no diarrhea in last few days. No recent change in her medications recently as well. No fever no chills. Patient has on and off cough which has been worsening over last few days. Denies any sputum production. Denies any active smoking or formal smoking.  ED Course: Patient presented with A. fib with RVR and was given Cardizem bolus despite which continued to remain in A. fib with RVR and therefore was started on Cardizem infusion with bolus. Patient returned to NSR and then developed bradycardia.  At her baseline ambulates without any support And is independent for most of her ADL; manages her medication on her own.  Review of Systems: as mentioned in the history of present illness.  A comprehensive review of the other systems is negative.  Past Medical History:  Diagnosis Date  . Diverticulosis   . Essential hypertension, benign   . History of chest pain    Low risk Cardiolite 2010  . Mixed hyperlipidemia   . PAF (paroxysmal atrial fibrillation) (HCC)    Past Surgical History:  Procedure Laterality Date  . TOTAL ABDOMINAL HYSTERECTOMY  1969   Social History:  reports that she has never smoked. She has never used smokeless tobacco. She reports that she does not drink alcohol or use drugs.  Allergies  Allergen Reactions  . Darvocet [Propoxyphene  N-Acetaminophen] Other (See Comments)    unknown  . Nitrofurantoin Monohyd Macro Other (See Comments)    unknown  . Plavix [Clopidogrel Bisulfate] Other (See Comments)    ???  . Sulfa Antibiotics Other (See Comments)    "face got puffy"  . Penicillins Rash       Family History  Problem Relation Age of Onset  . Cancer Father   . Cancer Mother   . Heart disease Sister     CABG age 65  . Cancer Brother      Prior to Admission medications   Medication Sig Start Date End Date Taking? Authorizing Provider  acetaminophen (TYLENOL) 500 MG tablet Take 1,000 mg by mouth every 6 (six) hours as needed for moderate pain.   Yes Historical Provider, MD  apixaban (ELIQUIS) 5 MG TABS tablet Take 1 tablet (5 mg total) by mouth 2 (two) times daily. 03/11/16  Yes Jonelle Sidle, MD  fluticasone (FLONASE) 50 MCG/ACT nasal spray Place 1-2 sprays into both nostrils as needed. 02/02/14  Yes Historical Provider, MD  folic acid (FOLVITE) 1 MG tablet Take 1 mg by mouth daily.   Yes Historical Provider, MD  lisinopril (PRINIVIL,ZESTRIL) 20 MG tablet Take 1 tablet (20 mg total) by mouth daily. 02/14/16  Yes Luke K Kilroy, PA-C  methotrexate (RHEUMATREX) 2.5 MG tablet 3 tabs by mouth twice a day on Tuesday's only. 07/14/16  Yes Historical Provider, MD  metoprolol succinate (TOPROL-XL) 25 MG 24 hr tablet Take 1 tablet (25 mg total) by mouth daily. 03/09/13  Yes Rollene Rotunda, MD  predniSONE (DELTASONE) 10 MG tablet Take 5 tablets by mouth daily.  07/12/16  Yes Historical Provider, MD  VITAMIN D, CHOLECALCIFEROL, PO Take 1 tablet by mouth daily.   Yes Historical Provider, MD    Physical Exam: Vitals:   08/01/16 1300 08/01/16 1330 08/01/16 1405 08/01/16 1430  BP: 142/56 121/82 107/65 116/99  Pulse: 96 99 68 (!) 59  Resp: 20 22 21 22   Temp:      TempSrc:      SpO2: 97% 95% 92% 95%  Weight:      Height:        General: Alert, Awake and Oriented to Time, Place and Person. Appear in mild distress Eyes:  PERRL, Conjunctiva normal ENT: Oral Mucosa clear moist. Neck: no JVD, no Abnormal Mass Or lumps Cardiovascular: S1 and S2 Present, aortic systolic Murmur, Peripheral Pulses Present Respiratory: Bilateral Air entry equal and Decreased, no Crackles, Occasional wheezes Abdomen: Bowel Sound present, Soft and no tenderness Skin: no redness, no Rash  Extremities: no Pedal edema, no calf tenderness Neurologic: Grossly no focal neuro deficit. Bilaterally Equal motor strength  Labs on Admission:  CBC:  Recent Labs Lab 08/01/16 0906  WBC 10.8*  HGB 14.4  HCT 45.6  MCV 98.3  PLT 140*   Basic Metabolic Panel:  Recent Labs Lab 08/01/16 0906  NA 141  K 4.1  CL 101  CO2 31  GLUCOSE 127*  BUN 19  CREATININE 1.09*  CALCIUM 9.7   GFR: Estimated Creatinine Clearance: 50 mL/min (by C-G formula based on SCr of 1.09 mg/dL (H)). Liver Function Tests: No results for input(s): AST, ALT, ALKPHOS, BILITOT, PROT, ALBUMIN in the last 168 hours. No results for input(s): LIPASE, AMYLASE in the last 168 hours. No results for input(s): AMMONIA in the last 168 hours. Coagulation Profile: No results for input(s): INR, PROTIME in the last 168 hours. Cardiac Enzymes:  Recent Labs Lab 08/01/16 0906  TROPONINI <0.03   BNP (last 3 results) No results for input(s): PROBNP in the last 8760 hours. HbA1C: No results for input(s): HGBA1C in the last 72 hours. CBG: No results for input(s): GLUCAP in the last 168 hours. Lipid Profile: No results for input(s): CHOL, HDL, LDLCALC, TRIG, CHOLHDL, LDLDIRECT in the last 72 hours. Thyroid Function Tests: No results for input(s): TSH, T4TOTAL, FREET4, T3FREE, THYROIDAB in the last 72 hours. Anemia Panel: No results for input(s): VITAMINB12, FOLATE, FERRITIN, TIBC, IRON, RETICCTPCT in the last 72 hours. Urine analysis: No results found for: COLORURINE, APPEARANCEUR, LABSPEC, PHURINE, GLUCOSEU, HGBUR, BILIRUBINUR, KETONESUR, PROTEINUR, UROBILINOGEN, NITRITE,  LEUKOCYTESUR  Radiological Exams on Admission: Dg Chest 2 View  Result Date: 08/01/2016 CLINICAL DATA:  Chest pain EXAM: CHEST  2 VIEW COMPARISON:  02/03/2016 FINDINGS: Normal heart size and mediastinal contours. Chronic elevation of the right diaphragm. There is no edema, consolidation, effusion, or pneumothorax. IMPRESSION: No active cardiopulmonary disease. Electronically Signed   By: 02/05/2016 M.D.   On: 08/01/2016 10:09   EKG: Independently reviewed. Initial EKG was showing A. fib with RVR. Repeat EKG showing sinus bradycardia with supraventricular bigeminy. Acute ST-T wave changes suggesting ischemia.  Assessment/Plan 1. Atrial fibrillation with rapid ventricular response (HCC) Currently in NSR. Heart rate improved. Discontinue Cardizem infusion. Continue Lopressor. Holding parameters. Continue Apixaban. Monitor serial troponin.  2. Suspected acute bronchitis. Patient has occasional wheezing on examination. Will use Flonase and DuoNeb nebs as needed. Ambulate and monitor oxygen. May require pulmonary referral as an outpatient since the patient has symptoms  of this bronchitis chronically.  3. Essential hypertension. Blood pressure stable. Continue Lopressor.  4. Rheumatoid arthritis. Continuing 5 mg of redness on her home regimen. No indication to continue methotrexate at present. Continue folic acid. Continue Tylenol when necessary for pain  Nutrition: Cardiac diet DVT Prophylaxis on therapeutic anticoagulation.  Advance goals of care discussion: Full code   Consults: None  Family Communication: family was present at bedside, at the time of interview.  Opportunity was given to ask question and all questions were answered satisfactorily.  Disposition: Admitted as observation, step-down unit. Likely to be discharged home, in 2 days.  Author: Lynden Oxford, MD [facility] Pager: [phone] 08/01/2016  If 7PM-7AM, please contact  night-coverage [email or web address] Password [email or web address]

## 2016-08-02 DIAGNOSIS — I480 Paroxysmal atrial fibrillation: Secondary | ICD-10-CM | POA: Diagnosis not present

## 2016-08-02 DIAGNOSIS — I4891 Unspecified atrial fibrillation: Secondary | ICD-10-CM | POA: Diagnosis not present

## 2016-08-02 LAB — CBC
HEMATOCRIT: 39.1 % (ref 36.0–46.0)
HEMOGLOBIN: 12.1 g/dL (ref 12.0–15.0)
MCH: 30.6 pg (ref 26.0–34.0)
MCHC: 30.9 g/dL (ref 30.0–36.0)
MCV: 99 fL (ref 78.0–100.0)
Platelets: 143 10*3/uL — ABNORMAL LOW (ref 150–400)
RBC: 3.95 MIL/uL (ref 3.87–5.11)
RDW: 12.3 % (ref 11.5–15.5)
WBC: 8.7 10*3/uL (ref 4.0–10.5)

## 2016-08-02 LAB — BASIC METABOLIC PANEL
ANION GAP: 9 (ref 5–15)
BUN: 18 mg/dL (ref 6–20)
CALCIUM: 9.4 mg/dL (ref 8.9–10.3)
CHLORIDE: 100 mmol/L — AB (ref 101–111)
CO2: 32 mmol/L (ref 22–32)
Creatinine, Ser: 0.98 mg/dL (ref 0.44–1.00)
GFR calc non Af Amer: 53 mL/min — ABNORMAL LOW (ref >60)
Glucose, Bld: 99 mg/dL (ref 65–99)
POTASSIUM: 4.8 mmol/L (ref 3.5–5.1)
Sodium: 141 mmol/L (ref 135–145)

## 2016-08-02 LAB — TROPONIN I: Troponin I: <0.03 ng/mL (ref <0.03)

## 2016-08-02 LAB — PROTIME-INR
INR: 1.2
PROTHROMBIN TIME: 15.3 s — AB (ref 11.4–15.2)

## 2016-08-02 MED ORDER — ALBUTEROL SULFATE HFA 108 (90 BASE) MCG/ACT IN AERS: 2.0000 | INHALATION_SPRAY | Freq: Four times a day (QID) | RESPIRATORY_TRACT | 2 refills | Status: DC | PRN

## 2016-08-02 MED ORDER — FLUTICASONE PROPIONATE 50 MCG/ACT NA SUSP: 2.0000 | Freq: Every day | NASAL | 2 refills | Status: DC

## 2016-08-02 NOTE — Progress Notes (Signed)
Patient alert and oriented. Vital signs are stable. Patient denies pain. Discharge instructions given. Patient verbalized understanding of instructions. Patient left floor via wheelchair with spouse and nursing staff.

## 2016-08-02 NOTE — Progress Notes (Signed)
Pt has been sinus rhythm on monitor with HR in the 60's through out the night.  Pt has no new complaints of pain or SOB.  08/02/16  5:13 AM  Ardra Kuznicki Shelia Media, RN

## 2016-08-07 NOTE — Discharge Summary (Signed)
[facility] Discharge Summary   Patient: Courtney Berry IDP:[id number]   PCP: Remus Loffler, PA DOB: November 23, 1932   Date of admission: 08/01/2016   Date of discharge: 08/02/2016    Discharge Diagnoses:  Principal Problem:   Atrial fibrillation with rapid ventricular response (HCC) Active Problems:   Dyslipidemia   Essential hypertension   Shortness of breath   Palpitations   Chronic anticoagulation   Bradycardia   Admitted From: Home Disposition:  Home  Recommendations for Outpatient Follow-up:  1. Follow-up with cardiology call in 3 weeks as needed   Follow-up Information    Nona Dell, MD. Schedule an appointment as soon as possible for a visit in 2 week(s).   Specialty:  Cardiology Contact information: [address] [place] [address] [phone]        Remus Loffler, PA. Schedule an appointment as soon as possible for a visit in 1 week(s).   Specialty:  Physician Assistant Contact information: [address] [place] [address] [phone]          Diet recommendation: Cardiac diet  Activity: The patient is advised to gradually reintroduce usual activities.  Discharge Condition: good  Code Status: Full code  History of present illness: As per the H and P dictated on admission, "Courtney Berry is a 82 y.o. female with Past medical history of paroxysmal A. fib, HTN, chronic anticoagulation. Patient presented with complaints of shortness of breath ongoing since this morning. She also had complains of palpitation. Denies any chest pain chest heaviness or chest tightness. No nausea no vomiting no diarrhea in last few days. No recent change in her medications recently as well. No fever no chills. Patient has on and off cough which has been worsening over last few days. Denies any sputum production. Denies any active smoking or formal smoking."  Hospital Course:  Summary of her active problems in the hospital is as following. 1. Atrial  fibrillation with rapid ventricular response (HCC) Currently in NSR. Heart rate improved. She was treated with Cardizem infusion. Continue Lopressor.  Continue Apixaban. Negative serial troponin.  2. Suspected bronchitis. Patient has occasional wheezing on examination. Will use Flonase. I did ordered albuterol when necessary May require pulmonary referral as an outpatient since the patient has symptoms of this bronchitis chronically. No evidence of active infection.  3. Essential hypertension. Blood pressure stable. Continue Lopressor.  4. Rheumatoid arthritis. Continuing 5 mg of prednisone her home regimen. Continue folic acid. Continue Tylenol when necessary for pain  All other chronic medical condition were stable during the hospitalization.  Patient was ambulatory without any assistance. On the day of the discharge the patient's vitals is stable, and no other acute medical condition were reported by patient. the patient was felt safe to be discharge at home with family.  Procedures and Results:  None   Consultations:  Phone consultation with cardiology  DISCHARGE MEDICATION: Discharge Medication List as of 08/02/2016  8:31 AM    CONTINUE these medications which have NOT CHANGED   Details  acetaminophen (TYLENOL) 500 MG tablet Take 1,000 mg by mouth every 6 (six) hours as needed for moderate pain., Historical Med    apixaban (ELIQUIS) 5 MG TABS tablet Take 1 tablet (5 mg total) by mouth 2 (two) times daily., Starting Tue 03/11/2016, Normal    folic acid (FOLVITE) 1 MG tablet Take 1 mg by mouth daily., Historical Med    lisinopril (PRINIVIL,ZESTRIL) 20 MG tablet Take 1 tablet (20 mg total) by mouth  daily., Starting Thu 02/14/2016, Normal    methotrexate (RHEUMATREX) 2.5 MG tablet 3 tabs by mouth twice a day on Tuesday's only., Starting Mon 07/14/2016, Historical Med    metoprolol succinate (TOPROL-XL) 25 MG 24 hr tablet Take 1 tablet (25 mg total) by mouth daily.,  Starting Wed 03/09/2013, Fax    predniSONE (DELTASONE) 10 MG tablet Take 5 tablets by mouth daily. , Starting Sat 07/12/2016, Historical Med    VITAMIN D, CHOLECALCIFEROL, PO Take 1 tablet by mouth daily., Historical Med    fluticasone (FLONASE) 50 MCG/ACT nasal spray Place 1-2 sprays into both nostrils as needed., Starting Thu 02/02/2014, Historical Med       Allergies  Allergen Reactions  . Darvocet [Propoxyphene N-Acetaminophen] Other (See Comments)    unknown  . Nitrofurantoin Monohyd Macro Other (See Comments)    unknown  . Plavix [Clopidogrel Bisulfate] Other (See Comments)    ???  . Sulfa Antibiotics Other (See Comments)    "face got puffy"  . Penicillins Rash   Discharge Instructions    Diet - low sodium heart healthy    Complete by:  As directed    Discharge instructions    Complete by:  As directed    It is important that you read following instructions as well as go over your medication list with RN to help you understand your care after this hospitalization.  Discharge Instructions: Please follow-up with PCP in one week  Please request your primary care physician to go over all Hospital Tests and Procedure/Radiological results at the follow up,  Please get all Hospital records sent to your PCP by signing hospital release before you go home.   Do not drive, operating heavy machinery, perform activities at heights, swimming or participation in water activities or provide baby sitting services; until you have been seen by Primary Care Physician or a Neurologist and advised to do so again. Do not take more than prescribed Pain, Sleep and Anxiety Medications. You were cared for by a hospitalist during your hospital stay. If you have any questions about your discharge medications or the care you received while you were in the hospital after you are discharged, you can call the unit and ask to speak with the hospitalist on call if the hospitalist that took care of you is not  available.  Once you are discharged, your primary care physician will handle any further medical issues. Please note that NO REFILLS for any discharge medications will be authorized once you are discharged, as it is imperative that you return to your primary care physician (or establish a relationship with a primary care physician if you do not have one) for your aftercare needs so that they can reassess your need for medications and monitor your lab values. You Must read complete instructions/literature along with all the possible adverse reactions/side effects for all the Medicines you take and that have been prescribed to you. Take any new Medicines after you have completely understood and accept all the possible adverse reactions/side effects. Wear Seat belts while driving. If you have smoked or chewed Tobacco in the last 2 yrs please stop smoking and/or stop any Recreational drug use.   Increase activity slowly    Complete by:  As directed      Discharge Exam: Filed Weights   08/01/16 0852 08/01/16 1542 08/02/16 0400  Weight: 99.8 kg (220 lb) 99.3 kg (218 lb 14.7 oz) 99.1 kg (218 lb 7.6 oz)   Vitals:   08/02/16 0745 08/02/16 0800  BP: (!) 152/73 138/70  Pulse: 72 81  Resp: 18 17  Temp:     General: Appear in no distress, no Rash; Oral Mucosa moist. Cardiovascular: S1 and S2 Present, no Murmur, no JVD Respiratory: Bilateral Air entry present and Clear to Auscultation, no Crackles, no wheezes Abdomen: Bowel Sound present, Soft and no tenderness Extremities: no Pedal edema, no calf tenderness Neurology: Grossly no focal neuro deficit.  The results of significant diagnostics from this hospitalization (including imaging, microbiology, ancillary and laboratory) are listed below for reference.    Significant Diagnostic Studies: Dg Chest 2 View  Result Date: 08/01/2016 CLINICAL DATA:  Chest pain EXAM: CHEST  2 VIEW COMPARISON:  02/03/2016 FINDINGS: Normal heart size and mediastinal  contours. Chronic elevation of the right diaphragm. There is no edema, consolidation, effusion, or pneumothorax. IMPRESSION: No active cardiopulmonary disease. Electronically Signed   By: Marnee Spring M.D.   On: 08/01/2016 10:09    Microbiology: Recent Results (from the past 240 hour(s))  MRSA PCR Screening     Status: None   Collection Time: 08/01/16  4:00 PM  Result Value Ref Range Status   MRSA by PCR NEGATIVE NEGATIVE Final    Comment:        The GeneXpert MRSA Assay (FDA approved for NASAL specimens only), is one component of a comprehensive MRSA colonization surveillance program. It is not intended to diagnose MRSA infection nor to guide or monitor treatment for MRSA infections.      Labs: CBC:  Recent Labs Lab 08/01/16 0906 08/02/16 0603  WBC 10.8* 8.7  HGB 14.4 12.1  HCT 45.6 39.1  MCV 98.3 99.0  PLT 140* 143*   Basic Metabolic Panel:  Recent Labs Lab 08/01/16 0906 08/02/16 0603  NA 141 141  K 4.1 4.8  CL 101 100*  CO2 31 32  GLUCOSE 127* 99  BUN 19 18  CREATININE 1.09* 0.98  CALCIUM 9.7 9.4   Liver Function Tests: No results for input(s): AST, ALT, ALKPHOS, BILITOT, PROT, ALBUMIN in the last 168 hours. No results for input(s): LIPASE, AMYLASE in the last 168 hours. No results for input(s): AMMONIA in the last 168 hours. Cardiac Enzymes:  Recent Labs Lab 08/01/16 0906 08/01/16 1507 08/01/16 2052 08/02/16 0332  TROPONINI <0.03 <0.03 <0.03 <0.03   BNP (last 3 results)  Recent Labs  02/03/16 1019 08/01/16 0906  BNP 70.0 28.0   CBG: No results for input(s): GLUCAP in the last 168 hours. Time spent: 30 minutes  Signed:  Reona Zendejas  [facility] 08/02/2016 , 12:40 AM

## 2016-08-14 ENCOUNTER — Ambulatory Visit (INDEPENDENT_AMBULATORY_CARE_PROVIDER_SITE_OTHER): Payer: Medicare Other | Admitting: Adult Health

## 2016-08-14 ENCOUNTER — Encounter: Payer: Self-pay | Admitting: Adult Health

## 2016-08-14 VITALS — BP 120/58 | HR 61 | Ht 68.0 in | Wt 222.0 lb

## 2016-08-14 DIAGNOSIS — Z79899 Other long term (current) drug therapy: Secondary | ICD-10-CM | POA: Diagnosis not present

## 2016-08-14 DIAGNOSIS — I480 Paroxysmal atrial fibrillation: Secondary | ICD-10-CM

## 2016-08-14 DIAGNOSIS — I10 Essential (primary) hypertension: Secondary | ICD-10-CM | POA: Diagnosis not present

## 2016-08-14 NOTE — Patient Instructions (Addendum)
  Your physician recommends that you schedule a follow-up appointment in: 3-4 Months with Dr. Diona Browner.   Your physician recommends that you continue on your current medications as directed. Please refer to the Current Medication list given to you today.  Your physician recommends that you return for lab work in: Monday  If you need a refill on your cardiac medications before your next appointment, please call your pharmacy.  Thank you for choosing [facility]!

## 2016-08-14 NOTE — Progress Notes (Signed)
Name: JANAT TABBERT    DOB: 11/07/1933  Age: 82 y.o.  MR#: [id number]       PCP:  Remus Loffler, PA      Insurance: Payor: MEDICARE / Plan: MEDICARE PART A AND B / Product Type: *No Product type* /   CC:   No chief complaint on file.   VS Vitals:   08/14/16 1442  Weight: 222 lb (100.7 kg)  Height: 5\' 8"  (1.727 m)    Weights Current Weight  08/14/16 222 lb (100.7 kg)  08/02/16 218 lb 7.6 oz (99.1 kg)  07/15/16 221 lb 12.8 oz (100.6 kg)    Blood Pressure  BP Readings from Last 3 Encounters:  08/02/16 138/70  07/15/16 (!) 158/79  03/11/16 131/71     Admit date:  (Not on file) Last encounter with RMR:  Visit date not found   Allergy Darvocet [propoxyphene n-acetaminophen]; Nitrofurantoin monohyd macro; Plavix [clopidogrel bisulfate]; Sulfa antibiotics; and Penicillins  Current Outpatient Prescriptions  Medication Sig Dispense Refill  . acetaminophen (TYLENOL) 500 MG tablet Take 1,000 mg by mouth every 6 (six) hours as needed for moderate pain.    03/13/16 apixaban (ELIQUIS) 5 MG TABS tablet Take 1 tablet (5 mg total) by mouth 2 (two) times daily. 60 tablet 3  . fluticasone (FLONASE) 50 MCG/ACT nasal spray Place 2 sprays into both nostrils daily. 16 g 2  . lisinopril (PRINIVIL,ZESTRIL) 20 MG tablet Take 1 tablet (20 mg total) by mouth daily. 30 tablet 11  . metoprolol succinate (TOPROL-XL) 25 MG 24 hr tablet Take 1 tablet (25 mg total) by mouth daily. 30 tablet 3  . predniSONE (DELTASONE) 10 MG tablet Take 1 tablet by mouth daily.     Marland Kitchen VITAMIN D, CHOLECALCIFEROL, PO Take 1 tablet by mouth daily.    Marland Kitchen albuterol (PROVENTIL HFA;VENTOLIN HFA) 108 (90 Base) MCG/ACT inhaler Inhale 2 puffs into the lungs every 6 (six) hours as needed for wheezing or shortness of breath. (Patient not taking: Reported on 08/14/2016) 1 Inhaler 2   No current facility-administered medications for this visit.     Discontinued Meds:    Medications Discontinued During This Encounter  Medication Reason  .  folic acid (FOLVITE) 1 MG tablet Error  . methotrexate (RHEUMATREX) 2.5 MG tablet Error    Patient Active Problem List   Diagnosis Date Noted  . Atrial fibrillation with rapid ventricular response (HCC) 08/01/2016  . Atrial fibrillation with RVR (HCC) 08/01/2016  . Bradycardia 08/01/2016  . Atrial fibrillation, new onset (HCC) 02/14/2016  . Chronic anticoagulation 02/14/2016  . Palpitations 03/02/2012  . Dyslipidemia 09/03/2009  . Essential hypertension 09/03/2009  . Shortness of breath 09/03/2009    LABS    Component Value Date/Time   NA 141 08/02/2016 0603   NA 141 08/01/2016 0906   NA 143 07/10/2016 0920   K 4.8 08/02/2016 0603   K 4.1 08/01/2016 0906   K 4.1 07/10/2016 0920   CL 100 (L) 08/02/2016 0603   CL 101 08/01/2016 0906   CL 104 07/10/2016 0920   CO2 32 08/02/2016 0603   CO2 31 08/01/2016 0906   CO2 30 07/10/2016 0920   GLUCOSE 99 08/02/2016 0603   GLUCOSE 127 (H) 08/01/2016 0906   GLUCOSE 97 07/10/2016 0920   BUN 18 08/02/2016 0603   BUN 19 08/01/2016 0906   BUN 18 07/10/2016 0920   CREATININE 0.98 08/02/2016 0603   CREATININE 1.09 (H) 08/01/2016 0906   CREATININE 1.06 (H) 07/10/2016 0920   CREATININE  0.91 02/21/2016 1232   CALCIUM 9.4 08/02/2016 0603   CALCIUM 9.7 08/01/2016 0906   CALCIUM 9.3 07/10/2016 0920   GFRNONAA 53 (L) 08/02/2016 0603   GFRNONAA 46 (L) 08/01/2016 0906   GFRNONAA 58 (L) 02/21/2016 1232   GFRAA >60 08/02/2016 0603   GFRAA 54 (L) 08/01/2016 0906   GFRAA >60 02/21/2016 1232   CMP     Component Value Date/Time   NA 141 08/02/2016 0603   K 4.8 08/02/2016 0603   CL 100 (L) 08/02/2016 0603   CO2 32 08/02/2016 0603   GLUCOSE 99 08/02/2016 0603   BUN 18 08/02/2016 0603   CREATININE 0.98 08/02/2016 0603   CREATININE 1.06 (H) 07/10/2016 0920   CALCIUM 9.4 08/02/2016 0603   GFRNONAA 53 (L) 08/02/2016 0603   GFRAA >60 08/02/2016 0603       Component Value Date/Time   WBC 8.7 08/02/2016 0603   WBC 10.8 (H) 08/01/2016 0906    WBC 10.2 07/10/2016 0920   HGB 12.1 08/02/2016 0603   HGB 14.4 08/01/2016 0906   HGB 13.5 07/10/2016 0920   HCT 39.1 08/02/2016 0603   HCT 45.6 08/01/2016 0906   HCT 41.5 07/10/2016 0920   MCV 99.0 08/02/2016 0603   MCV 98.3 08/01/2016 0906   MCV 93.5 07/10/2016 0920    Lipid Panel  No results found for: CHOL, TRIG, HDL, CHOLHDL, VLDL, LDLCALC, LDLDIRECT  ABG No results found for: PHART, PCO2ART, PO2ART, HCO3, TCO2, ACIDBASEDEF, O2SAT   Lab Results  Component Value Date   TSH 2.240 02/21/2016   BNP (last 3 results)  Recent Labs  02/03/16 1019 08/01/16 0906  BNP 70.0 28.0    ProBNP (last 3 results) No results for input(s): PROBNP in the last 8760 hours.  Cardiac Panel (last 3 results) No results for input(s): CKTOTAL, CKMB, TROPONINI, RELINDX in the last 72 hours.  Iron/TIBC/Ferritin/ %Sat No results found for: IRON, TIBC, FERRITIN, IRONPCTSAT   EKG Orders placed or performed during the hospital encounter of 08/01/16  . ED EKG within 10 minutes  . ED EKG within 10 minutes  . EKG 12-Lead  . EKG 12-Lead  . EKG 12-Lead  . EKG 12-Lead  . EKG 12-Lead  . EKG 12-Lead  . EKG     Prior Assessment and Plan Problem List as of 08/14/2016 Reviewed: 02/14/2016  4:41 PM by Corine Shelter, PA-C     Cardiovascular and Mediastinum   Essential hypertension   Last Assessment & Plan 02/14/2016 Office Visit Written 02/14/2016  4:30 PM by Abelino Derrick, PA-C    B/P elevated, ACE increased      Atrial fibrillation, new onset [facility])   Last Assessment & Plan 02/14/2016 Office Visit Written 02/14/2016  4:29 PM by Abelino Derrick, PA-C    She had been on a steroid dose pack- ? If this contributed.  NSR today in the office      Atrial fibrillation with rapid ventricular response (HCC)   Atrial fibrillation with RVR (HCC)     Other   Dyslipidemia   Last Assessment & Plan 02/14/2016 Office Visit Written 02/14/2016  4:31 PM by Abelino Derrick, PA-C    She is having significant should pain  (both shoulder). Will try a statin holiday.      Shortness of breath   Last Assessment & Plan 10/25/2014 Office Visit Written 10/25/2014 10:15 AM by Jonelle Sidle, MD    No overall change in status, specifically no decline with NYHA class II dyspnea. Continue  medical therapy and observation. Keep follow-up with primary care provider.      Palpitations   Last Assessment & Plan 04/20/2014 Office Visit Written 04/20/2014 11:53 AM by Jonelle Sidle, MD    No recurrences.      Chronic anticoagulation   Last Assessment & Plan 02/14/2016 Office Visit Written 02/14/2016  4:29 PM by Abelino Derrick, PA-C    CHADs2 VASc=3. Eliquis started, ASA stopped.      Bradycardia       Imaging: Dg Chest 2 View  Result Date: 08/01/2016 CLINICAL DATA:  Chest pain EXAM: CHEST  2 VIEW COMPARISON:  02/03/2016 FINDINGS: Normal heart size and mediastinal contours. Chronic elevation of the right diaphragm. There is no edema, consolidation, effusion, or pneumothorax. IMPRESSION: No active cardiopulmonary disease. Electronically Signed   By: Marnee Spring M.D.   On: 08/01/2016 10:09

## 2016-08-14 NOTE — Progress Notes (Signed)
Cardiology Office Note   Date:  08/14/2016   ID:  Courtney Berry, DOB 1933-02-16, MRN [id number]  PCP:  Remus Loffler, PA  Cardiologist: [place]/ Joni Reining, NP   Chief Complaint  Patient presents with  . Atrial Fibrillation  . Hypertension    History of Present Illness: Courtney Berry is a 82 y.o. female who presents for posthospitalization follow-up after admission for atrial fibrillation with RVR, with history of hypertension, dyslipidemia, and was also treated for bronchitis. She was treated with diltiazem infusion, and sent home on metoprolol succinate 25 mg daily, and ELIQUIS. On discharge her blood pressure was stable on discharge. She was continued on steroids on discharge for chronic bronchitis.  She comes today feeling much better. She has stopped taking methotrexate as she states that it was causing her to feel worse and felt it was also related to her rapid heart rhythm. She states she was sent home on albuterol inhaler but this was not provided on discharge. She is on prednisone daily. She is waiting to follow-up with her primary care provider for more structures on need to continue this or further assessment by pulmonologist.  She's had no further recurrence of rapid heart rhythm. She states she continues to feel tired but she denies any acute chest pain dizziness or severe dyspnea on exertion. She denies any bleeding issues or excessive bruising on ELIQUIS.  Past Medical History:  Diagnosis Date  . Diverticulosis   . Essential hypertension, benign   . History of chest pain    Low risk Cardiolite 2010  . Mixed hyperlipidemia   . PAF (paroxysmal atrial fibrillation) (HCC)     Past Surgical History:  Procedure Laterality Date  . TOTAL ABDOMINAL HYSTERECTOMY  1969     Current Outpatient Prescriptions  Medication Sig Dispense Refill  . acetaminophen (TYLENOL) 500 MG tablet Take 1,000 mg by mouth every 6 (six) hours as needed for moderate pain.    Marland Kitchen apixaban  (ELIQUIS) 5 MG TABS tablet Take 1 tablet (5 mg total) by mouth 2 (two) times daily. 60 tablet 3  . fluticasone (FLONASE) 50 MCG/ACT nasal spray Place 2 sprays into both nostrils daily. 16 g 2  . lisinopril (PRINIVIL,ZESTRIL) 20 MG tablet Take 1 tablet (20 mg total) by mouth daily. 30 tablet 11  . metoprolol succinate (TOPROL-XL) 25 MG 24 hr tablet Take 1 tablet (25 mg total) by mouth daily. 30 tablet 3  . predniSONE (DELTASONE) 10 MG tablet Take 1 tablet by mouth daily.     Marland Kitchen VITAMIN D, CHOLECALCIFEROL, PO Take 1 tablet by mouth daily.    Marland Kitchen albuterol (PROVENTIL HFA;VENTOLIN HFA) 108 (90 Base) MCG/ACT inhaler Inhale 2 puffs into the lungs every 6 (six) hours as needed for wheezing or shortness of breath. (Patient not taking: Reported on 08/14/2016) 1 Inhaler 2   No current facility-administered medications for this visit.     Allergies:   Darvocet [propoxyphene n-acetaminophen]; Nitrofurantoin monohyd macro; Plavix [clopidogrel bisulfate]; Sulfa antibiotics; and Penicillins    Social History:  The patient  reports that she has never smoked. She has never used smokeless tobacco. She reports that she does not drink alcohol or use drugs.   Family History:  The patient's family history includes Cancer in her brother, father, and mother; Heart disease in her sister.    ROS: All other systems are reviewed and negative. Unless otherwise mentioned in H&P    PHYSICAL EXAM: VS:  BP (!) 120/58   Pulse 61  Ht 5\' 8"  (1.727 m)   Wt 222 lb (100.7 kg)   SpO2 95%   BMI 33.75 kg/m  , BMI Body mass index is 33.75 kg/m. GEN: Well nourished, well developed, in no acute distress Obese HEENT: normal  Neck: no JVD, carotid bruits, or masses Cardiac: RRR;  1/6 systolic murmurs, rubs, or gallops,no edema  Respiratory:  clear to auscultation bilaterally, normal work of breathing GI: soft, nontender, nondistended, + BS MS: no deformity or atrophy  Skin: warm and dry, no rash Neuro:  Strength and sensation  are intact Psych: euthymic mood, full affect   Recent Labs: 02/21/2016: TSH 2.240 08/01/2016: B Natriuretic Peptide 28.0 08/02/2016: BUN 18; Creatinine, Ser 0.98; Hemoglobin 12.1; Platelets 143; Potassium 4.8; Sodium 141    Lipid Panel No results found for: CHOL, TRIG, HDL, CHOLHDL, VLDL, LDLCALC, LDLDIRECT    Wt Readings from Last 3 Encounters:  08/14/16 222 lb (100.7 kg)  08/02/16 218 lb 7.6 oz (99.1 kg)  07/15/16 221 lb 12.8 oz (100.6 kg)      Other studies Reviewed: Additional studies/ records that were reviewed today include: Echocardiogram 02/21/2016 Review of the above records demonstrates:  Left ventricle: The cavity size was normal. Wall thickness was   increased in a pattern of moderate LVH. Systolic function was   normal. The estimated ejection fraction was in the range of 60%   to 65%. Wall motion was normal; there were no regional wall   motion abnormalities. Doppler parameters are consistent with   abnormal left ventricular relaxation (grade 1 diastolic   dysfunction). Doppler parameters are consistent with high   ventricular filling pressure. - Aortic valve: Moderately calcified annulus. Trileaflet. - Mitral valve: Calcified annulus. There was trivial regurgitation. - Right atrium: Central venous pressure (est): 3 mm Hg. - Tricuspid valve: There was trivial regurgitation. - Pulmonary arteries: Systolic pressure could not be accurately   estimated. - Pericardium, extracardiac: A prominent pericardial fat pad was   ASSESSMENT AND PLAN:  1.  Paroxysmal atrial fibrillation with RVR: This resolved during recent admission. She continues medically compliant with ELIQUIS, metoprolol, and has had no recurrence of rapid rhythm. I will continue her on current medication regimen at this time. I've advised her to report any excessive bruising or bleeding on ELIQUIS. I will check a CBC and a BMET.  2. Chronic dyspnea: She was advised to follow-up with a pulmonologist  posthospitalization. She wishes to discuss this with her primary care provider. She asked about prescription for albuterol inhaler. She is on by mouth steroids and do not wish to provide her with inhalers at this time. I am deferring to her primary care or pulmonologist to she may see on referral. She verbalizes understanding.  3. Hypertension: Currently well-controlled on lisinopril and metoprolol. BMET is ordered to evaluate kidney function.   Current medicines are reviewed at length with the patient today.    Labs/ tests ordered today include: CBC and BMET  No orders of the defined types were placed in this encounter.    Disposition:   FU with Dr. 04/22/2016 in 3 months.  Signed, Diona Browner, NP  08/14/2016 2:55 PM    [facility] [address], [place], [place] Phone: [phone]; Fax: [phone]

## 2016-08-18 ENCOUNTER — Telehealth: Payer: Self-pay | Admitting: Adult Health

## 2016-08-18 DIAGNOSIS — R0600 Dyspnea, unspecified: Secondary | ICD-10-CM

## 2016-08-18 DIAGNOSIS — Z79899 Other long term (current) drug therapy: Secondary | ICD-10-CM | POA: Diagnosis not present

## 2016-08-18 DIAGNOSIS — I10 Essential (primary) hypertension: Secondary | ICD-10-CM | POA: Diagnosis not present

## 2016-08-18 LAB — CBC WITH DIFFERENTIAL/PLATELET
BASOS ABS: 0 {cells}/uL (ref 0–200)
Basophils Relative: 0 %
EOS ABS: 150 {cells}/uL (ref 15–500)
Eosinophils Relative: 2 %
HEMATOCRIT: 39.4 % (ref 35.0–45.0)
HEMOGLOBIN: 12.8 g/dL (ref 11.7–15.5)
LYMPHS ABS: 3150 {cells}/uL (ref 850–3900)
Lymphocytes Relative: 42 %
MCH: 30.5 pg (ref 27.0–33.0)
MCHC: 32.5 g/dL (ref 32.0–36.0)
MCV: 93.8 fL (ref 80.0–100.0)
MONO ABS: 825 {cells}/uL (ref 200–950)
MPV: 13.8 fL — ABNORMAL HIGH (ref 7.5–12.5)
Monocytes Relative: 11 %
NEUTROS ABS: 3375 {cells}/uL (ref 1500–7800)
NEUTROS PCT: 45 %
Platelets: 245 10*3/uL (ref 140–400)
RBC: 4.2 MIL/uL (ref 3.80–5.10)
RDW: 14.2 % (ref 11.0–15.0)
WBC: 7.5 10*3/uL (ref 3.8–10.8)

## 2016-08-18 NOTE — Telephone Encounter (Signed)
Patient's daughter, [name], said that at last week's appointment with Joni Reining it was discussed that there was potential to send the patient to a pulmonologist. [name] stated that Samara Deist requested the patient see her PCP prior to moving forward with the referral. [name] stated that Ms. Helbig's PCP, Dr. Yetta Barre', has switched practices and does not have her Tax ID/license switched yet therefore they aren't scheduling appointments for her at this time.  Patient asked her daughter to call us and as if we can we go ahead and place the pulmonogist referral for Dr. Juanetta Gosling without her having to see her PCP/Dr. Yetta Barre first?  Please call the patient, Tabrina, at [phone] to confirm or deny the request. If Bedie doesn't answer we can call her daughter, [name] @ [phone].

## 2016-08-18 NOTE — Telephone Encounter (Signed)
Will forward to k lawrence NP for advice 

## 2016-08-18 NOTE — Telephone Encounter (Signed)
Ref placed with Dr Juanetta Gosling, daughter notified

## 2016-08-18 NOTE — Telephone Encounter (Signed)
Okay to refer to Dr. Juanetta Gosling for pulmonary assessment of dyspnea.

## 2016-08-18 NOTE — Addendum Note (Signed)
Addended by: Marlyn Corporal A on: 08/18/2016 11:30 AM   Modules accepted: Orders

## 2016-08-19 LAB — BASIC METABOLIC PANEL
BUN: 13 mg/dL (ref 7–25)
CALCIUM: 9.6 mg/dL (ref 8.6–10.4)
CHLORIDE: 103 mmol/L (ref 98–110)
CO2: 33 mmol/L — AB (ref 20–31)
Creat: 1.01 mg/dL — ABNORMAL HIGH (ref 0.60–0.88)
GLUCOSE: 107 mg/dL — AB (ref 65–99)
POTASSIUM: 5 mmol/L (ref 3.5–5.3)
SODIUM: 148 mmol/L — AB (ref 135–146)

## 2016-08-20 DIAGNOSIS — M79642 Pain in left hand: Secondary | ICD-10-CM | POA: Diagnosis not present

## 2016-08-20 DIAGNOSIS — M0609 Rheumatoid arthritis without rheumatoid factor, multiple sites: Secondary | ICD-10-CM | POA: Diagnosis not present

## 2016-08-20 DIAGNOSIS — M79641 Pain in right hand: Secondary | ICD-10-CM | POA: Diagnosis not present

## 2016-08-20 DIAGNOSIS — R0602 Shortness of breath: Secondary | ICD-10-CM | POA: Diagnosis not present

## 2016-08-22 ENCOUNTER — Other Ambulatory Visit: Payer: Self-pay | Admitting: *Deleted

## 2016-08-22 DIAGNOSIS — E870 Hyperosmolality and hypernatremia: Secondary | ICD-10-CM

## 2016-08-25 ENCOUNTER — Other Ambulatory Visit (INDEPENDENT_AMBULATORY_CARE_PROVIDER_SITE_OTHER): Payer: Medicare Other

## 2016-08-25 DIAGNOSIS — E870 Hyperosmolality and hypernatremia: Secondary | ICD-10-CM

## 2016-08-26 LAB — BMP8+EGFR
BUN/Creatinine Ratio: 11 — ABNORMAL LOW (ref 12–28)
BUN: 11 mg/dL (ref 8–27)
CALCIUM: 9.4 mg/dL (ref 8.7–10.3)
CHLORIDE: 104 mmol/L (ref 96–106)
CO2: 28 mmol/L (ref 18–29)
Creatinine, Ser: 0.96 mg/dL (ref 0.57–1.00)
GFR calc Af Amer: 64 mL/min/{1.73_m2} (ref >59)
GFR, EST NON AFRICAN AMERICAN: 56 mL/min/{1.73_m2} — AB (ref >59)
GLUCOSE: 108 mg/dL — AB (ref 65–99)
POTASSIUM: 4.8 mmol/L (ref 3.5–5.2)
Sodium: 146 mmol/L — ABNORMAL HIGH (ref 134–144)

## 2016-09-22 DIAGNOSIS — I4891 Unspecified atrial fibrillation: Secondary | ICD-10-CM | POA: Diagnosis not present

## 2016-09-22 DIAGNOSIS — E669 Obesity, unspecified: Secondary | ICD-10-CM | POA: Diagnosis not present

## 2016-09-22 DIAGNOSIS — R0602 Shortness of breath: Secondary | ICD-10-CM | POA: Diagnosis not present

## 2016-09-23 ENCOUNTER — Other Ambulatory Visit (HOSPITAL_COMMUNITY): Payer: Self-pay | Admitting: Respiratory Therapy

## 2016-09-23 DIAGNOSIS — R0602 Shortness of breath: Secondary | ICD-10-CM

## 2016-09-24 ENCOUNTER — Ambulatory Visit (HOSPITAL_COMMUNITY)
Admission: RE | Admit: 2016-09-24 | Discharge: 2016-09-24 | Disposition: A | Discharge status: Home / Self Care | Payer: Medicare Other | Source: Ambulatory Visit | Attending: Pulmonary Disease | Admitting: Pulmonary Disease

## 2016-09-24 DIAGNOSIS — R0602 Shortness of breath: Secondary | ICD-10-CM | POA: Insufficient documentation

## 2016-09-24 LAB — PULMONARY FUNCTION TEST
DL/VA % PRED: 105 %
DL/VA: 5.24 ml/min/mmHg/L
DLCO UNC % PRED: 52 %
DLCO unc: 13.88 ml/min/mmHg
FEF 25-75 POST: 0.82 L/s
FEF 25-75 PRE: 0.58 L/s
FEF2575-%CHANGE-POST: 41 %
FEF2575-%PRED-POST: 58 %
FEF2575-%PRED-PRE: 41 %
FEV1-%Change-Post: 8 %
FEV1-%PRED-POST: 52 %
FEV1-%Pred-Pre: 47 %
FEV1-Post: 1.06 L
FEV1-Pre: 0.97 L
FEV1FVC-%CHANGE-POST: 4 %
FEV1FVC-%Pred-Pre: 96 %
FEV6-%CHANGE-POST: 4 %
FEV6-%Pred-Post: 56 %
FEV6-%Pred-Pre: 53 %
FEV6-Post: 1.44 L
FEV6-Pre: 1.38 L
FEV6FVC-%Change-Post: 0 %
FEV6FVC-%Pred-Post: 106 %
FEV6FVC-%Pred-Pre: 105 %
FVC-%CHANGE-POST: 4 %
FVC-%PRED-POST: 53 %
FVC-%PRED-PRE: 50 %
FVC-POST: 1.44 L
FVC-PRE: 1.38 L
POST FEV1/FVC RATIO: 74 %
PRE FEV1/FVC RATIO: 70 %
Post FEV6/FVC ratio: 100 %
Pre FEV6/FVC Ratio: 100 %
RV % pred: 98 %
RV: 2.45 L
TLC % pred: 82 %
TLC: 4.38 L

## 2016-09-24 MED ORDER — ALBUTEROL SULFATE (2.5 MG/3ML) 0.083% IN NEBU
2.5000 mg | INHALATION_SOLUTION | Freq: Once | RESPIRATORY_TRACT | Status: AC
  Administered 2016-09-24: 2.5 mg via RESPIRATORY_TRACT

## 2016-10-02 DIAGNOSIS — M25511 Pain in right shoulder: Secondary | ICD-10-CM | POA: Diagnosis not present

## 2016-10-02 DIAGNOSIS — Z79899 Other long term (current) drug therapy: Secondary | ICD-10-CM | POA: Diagnosis not present

## 2016-10-02 DIAGNOSIS — R0602 Shortness of breath: Secondary | ICD-10-CM | POA: Diagnosis not present

## 2016-10-02 DIAGNOSIS — M25512 Pain in left shoulder: Secondary | ICD-10-CM | POA: Diagnosis not present

## 2016-10-02 DIAGNOSIS — M79641 Pain in right hand: Secondary | ICD-10-CM | POA: Diagnosis not present

## 2016-10-02 DIAGNOSIS — M0609 Rheumatoid arthritis without rheumatoid factor, multiple sites: Secondary | ICD-10-CM | POA: Diagnosis not present

## 2016-10-02 DIAGNOSIS — M79642 Pain in left hand: Secondary | ICD-10-CM | POA: Diagnosis not present

## 2016-10-20 ENCOUNTER — Encounter: Payer: Self-pay | Admitting: Physician Assistant

## 2016-10-20 ENCOUNTER — Ambulatory Visit (INDEPENDENT_AMBULATORY_CARE_PROVIDER_SITE_OTHER): Payer: Medicare Other | Admitting: Physician Assistant

## 2016-10-20 ENCOUNTER — Encounter (INDEPENDENT_AMBULATORY_CARE_PROVIDER_SITE_OTHER): Payer: Self-pay

## 2016-10-20 VITALS — BP 100/60 | HR 86 | Temp 98.7°F | Ht 68.0 in | Wt 227.0 lb

## 2016-10-20 DIAGNOSIS — R0989 Other specified symptoms and signs involving the circulatory and respiratory systems: Secondary | ICD-10-CM

## 2016-10-20 DIAGNOSIS — I10 Essential (primary) hypertension: Secondary | ICD-10-CM | POA: Diagnosis not present

## 2016-10-20 DIAGNOSIS — Z7901 Long term (current) use of anticoagulants: Secondary | ICD-10-CM

## 2016-10-20 DIAGNOSIS — M069 Rheumatoid arthritis, unspecified: Secondary | ICD-10-CM | POA: Insufficient documentation

## 2016-10-20 DIAGNOSIS — R600 Localized edema: Secondary | ICD-10-CM | POA: Diagnosis not present

## 2016-10-20 DIAGNOSIS — E785 Hyperlipidemia, unspecified: Secondary | ICD-10-CM

## 2016-10-20 DIAGNOSIS — I739 Peripheral vascular disease, unspecified: Secondary | ICD-10-CM | POA: Diagnosis not present

## 2016-10-20 DIAGNOSIS — R230 Cyanosis: Secondary | ICD-10-CM | POA: Diagnosis not present

## 2016-10-20 DIAGNOSIS — I4891 Unspecified atrial fibrillation: Secondary | ICD-10-CM

## 2016-10-20 DIAGNOSIS — J441 Chronic obstructive pulmonary disease with (acute) exacerbation: Secondary | ICD-10-CM | POA: Diagnosis not present

## 2016-10-20 DIAGNOSIS — M0579 Rheumatoid arthritis with rheumatoid factor of multiple sites without organ or systems involvement: Secondary | ICD-10-CM

## 2016-10-20 MED ORDER — LISINOPRIL 20 MG PO TABS: 10.0000 mg | ORAL_TABLET | Freq: Every day | ORAL | 11 refills | Status: DC

## 2016-10-20 MED ORDER — HYDROCHLOROTHIAZIDE 25 MG PO TABS: 25.0000 mg | ORAL_TABLET | Freq: Every day | ORAL | 3 refills | Status: DC

## 2016-10-20 NOTE — Patient Instructions (Signed)
Intermittent Claudication Intermittent claudication is pain in your leg that occurs when you walk or exercise and goes away when you rest. The pain can occur in one or both legs. CAUSES Intermittent claudication is caused by the buildup of plaque within the major arteries in the body (atherosclerosis). The plaque, which makes arteries stiff and narrow, prevents enough blood from reaching your leg muscles. The pain occurs when you walk or exercise because your muscles need more blood when you are moving and exercising. RISK FACTORS Risk factors include:  A family history of atherosclerosis.  A personal history of stroke or heart disease.  Older age.  Being inactive or overweight.  Smoking cigarettes.  Having another health condition such as:  Diabetes.  High blood pressure.  High cholesterol. SIGNS AND SYMPTOMS  Your hip or leg may:   Ache.  Cramp.  Feel tight.  Feel weak.  Feel heavy. Over time, you may feel pain in your calf, thigh, or hip. DIAGNOSIS  Your health care provider may diagnose intermittent claudication based on your symptoms and medical history. Your health care provider may also do tests to learn more about your condition. These may include:  Blood tests.  An ultrasound.  Imaging tests such as angiography, magnetic resonance angiography (MRA), and computed tomography angiography (CTA). TREATMENT You may be treated for problems such as:  High blood pressure.  High cholesterol.  Diabetes. Other treatments may include:  Lifestyle changes such as:  Starting an exercise program.  Losing weight.  Quitting smoking.  Medicines to help restore blood flow through your legs.  Blood vessel surgery (angioplasty) to restore blood flow if your intermittent claudication is caused by severe peripheral artery disease. HOME CARE INSTRUCTIONS  Manage any other health conditions you have.  Eat a diet low in saturated fats and calories to maintain a  healthy weight.  Quit smoking, if you smoke.  Take medicines only as directed by your health care provider.  If your health care provider recommended an exercise program for you, follow it as directed. Your exercise program may involve:  Walking three or more times a week.  Walking until you have certain symptoms of intermittent claudication.  Resting until symptoms go away.  Gradually increasing walking time to about 50 minutes a day. SEEK MEDICAL CARE IF: Your condition is not getting better or is getting worse. SEEK IMMEDIATE MEDICAL CARE IF:   You have chest pain.  You have difficulty breathing.  You develop arm weakness.  You have trouble speaking.  Your face begins to droop. MAKE SURE YOU:  Understand these instructions.  Will watch your condition.  Will get help if you are not doing well or get worse. This information is not intended to replace advice given to you by your health care provider. Make sure you discuss any questions you have with your health care provider. Document Released: 09/05/2004 Document Revised: 11/24/2014 Document Reviewed: 02/09/2014 Elsevier Interactive Patient Education  2017 Elsevier Inc.  

## 2016-10-20 NOTE — Progress Notes (Signed)
BP 100/60 (BP Location: Left Wrist, Patient Position: Sitting, Cuff Size: Normal)   Pulse 86   Temp 98.7 F (37.1 C) (Oral)   Ht 5\' 8"  (1.727 m)   Wt 227 lb (103 kg)   BMI 34.52 kg/m    Subjective:    Patient ID: , female    DOB: 12-Oct-1933, 82 y.o.   MRN: [id number]  Courtney Berry is a 82 y.o. female presenting on 10/20/2016 for Edema (bilateral lower extremity edema)  HPI Patient here to be established as new patient at [facility].  This patient is known to me from [facility]. This patient comes in for periodic recheck on medications and conditions. All medications are reviewed today. There are no reports of any problems with the medications. All of the medical conditions are reviewed and updated.  Lab work is reviewed and will be ordered as medically necessary.   She was hospitalized for a recent atrial fibrillation exacerbation. All she was there it was determined she had some obstructive lung symptoms. She has begun [facility] and has recently had a pulmonary function test. Approximately 1 month ago she also started ARAVA with her rheumatologist, after not tolerating methotrexate.   In just the past few days she has begun to have more localized edema in her lower extremities. She has skin is cool to the touch. She frequently has burning in her feet at night and this has not changed. She has not been taking her fluid pill on a regular basis due to feeling weak in the past; not to use it all the time. In addition she has noted the swelling is very persistent despite having her feet elevated. Some claudication over the past few weeks.  Past Medical History:  Diagnosis Date  . A-fib (HCC)   . COPD (chronic obstructive pulmonary disease) (HCC)   . Diverticulosis   . Essential hypertension, benign   . History of chest pain    Low risk Cardiolite 2010  . Mixed hyperlipidemia   . PAF (paroxysmal atrial fibrillation) (HCC)   .  Rheumatoid arthritis (HCC)    Relevant past medical, surgical, family and social history reviewed and updated as indicated. Interim medical history since our last visit reviewed. Allergies and medications reviewed and updated.   Data reviewed from any sources in EPIC.  Review of Systems  Constitutional: Positive for fatigue. Negative for activity change, chills and fever.  HENT: Negative.   Eyes: Negative.   Respiratory: Negative.  Negative for cough.   Cardiovascular: Positive for palpitations and leg swelling. Negative for chest pain.  Gastrointestinal: Negative.  Negative for abdominal pain.  Endocrine: Negative.   Genitourinary: Negative.  Negative for dysuria.  Musculoskeletal: Positive for arthralgias, back pain and joint swelling.  Skin: Positive for pallor. Negative for rash and wound.  Neurological: Negative.   Psychiatric/Behavioral: The patient is nervous/anxious.      Social History   Social History  . Marital status: Married    Spouse name: N/A  . Number of children: N/A  . Years of education: N/A   Occupational History  . Retired    Social History Main Topics  . Smoking status: Never Smoker  . Smokeless tobacco: Never Used     Comment: Tobacco use-no  . Alcohol use No  . Drug use: No  . Sexual activity: Not on file   Other Topics Concern  . Not on file   Social History Narrative  . No narrative  on file    Past Surgical History:  Procedure Laterality Date  . TOTAL ABDOMINAL HYSTERECTOMY  1969    Family History  Problem Relation Age of Onset  . Cancer Father   . Cancer Mother   . Heart disease Sister     CABG age 56  . Cancer Brother       Medication List       Accurate as of 10/20/16  5:21 PM. Always use your most recent med list.          acetaminophen 500 MG tablet Commonly known as:  TYLENOL Take 1,000 mg by mouth every 6 (six) hours as needed for moderate pain.   ANORO ELLIPTA 62.5-25 MCG/INH Aepb Generic drug:   umeclidinium-vilanterol Inhale 1 puff into the lungs daily.   apixaban 5 MG Tabs tablet Commonly known as:  ELIQUIS Take 1 tablet (5 mg total) by mouth 2 (two) times daily.   fluticasone 50 MCG/ACT nasal spray Commonly known as:  FLONASE Place 2 sprays into both nostrils daily.   hydrochlorothiazide 25 MG tablet Commonly known as:  HYDRODIURIL Take 1 tablet (25 mg total) by mouth daily.   leflunomide 20 MG tablet Commonly known as:  ARAVA Take 20 mg by mouth daily.   lisinopril 20 MG tablet Commonly known as:  PRINIVIL,ZESTRIL Take 0.5-1 tablets (10-20 mg total) by mouth daily.   metoprolol succinate 25 MG 24 hr tablet Commonly known as:  TOPROL-XL Take 1 tablet (25 mg total) by mouth daily.   predniSONE 10 MG tablet Commonly known as:  DELTASONE Take 1 tablet by mouth daily.   PROAIR HFA 108 (90 Base) MCG/ACT inhaler Generic drug:  albuterol   VITAMIN D (CHOLECALCIFEROL) PO Take 1 tablet by mouth daily.          Objective:    BP 100/60 (BP Location: Left Wrist, Patient Position: Sitting, Cuff Size: Normal)   Pulse 86   Temp 98.7 F (37.1 C) (Oral)   Ht 5\' 8"  (1.727 m)   Wt 227 lb (103 kg)   BMI 34.52 kg/m   Allergies  Allergen Reactions  . Darvocet [Propoxyphene N-Acetaminophen] Other (See Comments)    unknown  . Nitrofurantoin Monohyd Macro Other (See Comments)    unknown  . Plavix [Clopidogrel Bisulfate] Other (See Comments)    ???  . Sulfa Antibiotics Other (See Comments)    "face got puffy"  . Penicillins Rash    Has patient had a PCN reaction causing immediate rash, facial/tongue/throat swelling, SOB or lightheadedness with hypotension: no Has patient had a PCN reaction causing severe rash involving mucus membranes or skin necrosis:no Has patient had a PCN reaction that required hospitalization No Has patient had a PCN reaction occurring within the last 10 years: No If all of the above answers are "NO", then may proceed with Cephalosporin use.     Wt Readings from Last 3 Encounters:  10/20/16 227 lb (103 kg)  08/14/16 222 lb (100.7 kg)  08/02/16 218 lb 7.6 oz (99.1 kg)    Physical Exam  Constitutional: She is oriented to person, place, and time. She appears well-developed and well-nourished.  HENT:  Head: Normocephalic and atraumatic.  Eyes: Conjunctivae and EOM are normal. Pupils are equal, round, and reactive to light.  Cardiovascular: Normal rate, regular rhythm, S1 normal, S2 normal, normal heart sounds and intact distal pulses.   No extrasystoles are present.  No murmur heard. Pulses:      Dorsalis pedis pulses are 1+ on the right  side, and 1+ on the left side.       Posterior tibial pulses are 1+ on the right side, and 1+ on the left side.  Regular today, feet to lower 1/3 legs 2+ pitting edema, cool to touch, cyanosis  Pulmonary/Chest: Effort normal and breath sounds normal.  Abdominal: Soft. Bowel sounds are normal.  Neurological: She is alert and oriented to person, place, and time. She has normal reflexes.  Skin: Skin is warm and dry. No rash noted.  Psychiatric: She has a normal mood and affect. Her behavior is normal. Judgment and thought content normal.        Assessment & Plan:   1. Decreased pulses in feet - Ambulatory referral to Vascular Surgery  2. Localized edema - Ambulatory referral to Vascular Surgery - hydrochlorothiazide (HYDRODIURIL) 25 MG tablet; Take 1 tablet (25 mg total) by mouth daily.  Dispense: 90 tablet; Refill: 3  3. Extremity cyanosis - Ambulatory referral to Vascular Surgery  4. Peripheral vascular disease (HCC) - Ambulatory referral to Vascular Surgery  5. Rheumatoid arthritis involving multiple sites with positive rheumatoid factor (HCC) - leflunomide (ARAVA) 20 MG tablet; Take 20 mg by mouth daily.  6. Atrial fibrillation with rapid ventricular response (HCC)  7. Essential hypertension - lisinopril (PRINIVIL,ZESTRIL) 20 MG tablet; Take 0.5-1 tablets (10-20 mg total) by  mouth daily.  Dispense: 30 tablet; Refill: 11  8. Chronic anticoagulation  9. Dyslipidemia  10. COPD exacerbation (HCC) - PROAIR HFA 108 (90 Base) MCG/ACT inhaler;  - umeclidinium-vilanterol (ANORO ELLIPTA) 62.5-25 MCG/INH AEPB; Inhale 1 puff into the lungs daily.    Continue all other maintenance medications as listed above. Educational handout given for claudication  Follow up plan: Return in about 4 weeks (around 11/17/2016) for recheck.  Remus Loffler PA-C [facility] [address]  [place], [place] [address] [phone]   10/20/2016, 5:21 PM

## 2016-10-21 ENCOUNTER — Telehealth: Payer: Self-pay | Admitting: *Deleted

## 2016-10-21 ENCOUNTER — Telehealth: Payer: Self-pay | Admitting: Cardiology

## 2016-10-21 NOTE — Telephone Encounter (Signed)
Patient notified that Eliquis is in office for pick-up  

## 2016-10-21 NOTE — Telephone Encounter (Signed)
Patient's daughter states that she needs to speak with Cathey but would not give the reason why. / tg

## 2016-10-21 NOTE — Telephone Encounter (Signed)
Daughter states pt was given referral to [facility] this Friday with Dr Imogene Burn for PVD and consult and wanted [place] to know.Also states her lisinopril was reduced to 10 mg daily

## 2016-10-23 ENCOUNTER — Telehealth: Payer: Self-pay

## 2016-10-23 ENCOUNTER — Other Ambulatory Visit: Payer: Self-pay

## 2016-10-23 ENCOUNTER — Encounter: Payer: Self-pay | Admitting: Vascular Surgery

## 2016-10-23 DIAGNOSIS — R0989 Other specified symptoms and signs involving the circulatory and respiratory systems: Secondary | ICD-10-CM

## 2016-10-23 NOTE — Telephone Encounter (Signed)
Patient will stop buy to provide documents for [facility] Pt assistance program.

## 2016-10-24 ENCOUNTER — Ambulatory Visit (HOSPITAL_COMMUNITY)
Admission: RE | Admit: 2016-10-24 | Discharge: 2016-10-24 | Disposition: A | Discharge status: Home / Self Care | Payer: Medicare Other | Source: Ambulatory Visit | Attending: Vascular Surgery | Admitting: Vascular Surgery

## 2016-10-24 ENCOUNTER — Encounter: Payer: Self-pay | Admitting: Vascular Surgery

## 2016-10-24 ENCOUNTER — Ambulatory Visit (INDEPENDENT_AMBULATORY_CARE_PROVIDER_SITE_OTHER): Payer: Medicare Other | Admitting: Vascular Surgery

## 2016-10-24 VITALS — BP 137/84 | HR 87 | Temp 97.2°F | Resp 18 | Ht 67.0 in | Wt 224.0 lb

## 2016-10-24 DIAGNOSIS — R0989 Other specified symptoms and signs involving the circulatory and respiratory systems: Secondary | ICD-10-CM

## 2016-10-24 DIAGNOSIS — R609 Edema, unspecified: Secondary | ICD-10-CM | POA: Diagnosis not present

## 2016-10-24 NOTE — Progress Notes (Signed)
Patient ID: Courtney Berry, female   DOB: 1933/04/16, 82 y.o.   MRN: [id number]  Reason for Consult: New Evaluation (swelling in bilateral feet and lower calves for 1 week  )   Referred by Remus Loffler, PA-C  Subjective:     HPI:  Courtney Berry is a 82 y.o. female with COPD, atrial fibrillation, hypertension, rheumatoid arthritis Center for evaluation of bilateral lower extremity edema. She says that she has mostly foot edema that has worsened recently. There is some association with steroids that she was placed on 4 rheumatoid arthritis. Her limitation to walking is her shortness of breath not her swelling in her legs. She does not wear shoes which is around the house and does not get leg pain with ambulation. She has not had ulceration or tissue loss of her bilateral lower extremities. She does take Eliquis for atrial fibrillation. She was initially started on hydrochlorothiazide and maybe has some improvement in her swelling with this. Her feet have been called as long she can remember but that is not painful to her. She has no other specific complaints related to today's visit.  Past Medical History:  Diagnosis Date  . A-fib (HCC)   . COPD (chronic obstructive pulmonary disease) (HCC)   . Diverticulosis   . Essential hypertension, benign   . History of chest pain    Low risk Cardiolite 2010  . Mixed hyperlipidemia   . PAF (paroxysmal atrial fibrillation) (HCC)   . Rheumatoid arthritis (HCC)    Family History  Problem Relation Age of Onset  . Cancer Father   . Cancer Mother   . Heart disease Sister     CABG age 16  . Cancer Brother    Past Surgical History:  Procedure Laterality Date  . TOTAL ABDOMINAL HYSTERECTOMY  1969    Short Social History:  Social History  Substance Use Topics  . Smoking status: Never Smoker  . Smokeless tobacco: Never Used     Comment: Tobacco use-no  . Alcohol use No      Current Outpatient Prescriptions  Medication Sig Dispense  Refill  . acetaminophen (TYLENOL) 500 MG tablet Take 1,000 mg by mouth every 6 (six) hours as needed for moderate pain.    Marland Kitchen apixaban (ELIQUIS) 5 MG TABS tablet Take 1 tablet (5 mg total) by mouth 2 (two) times daily. 60 tablet 3  . fluticasone (FLONASE) 50 MCG/ACT nasal spray Place 2 sprays into both nostrils daily. 16 g 2  . hydrochlorothiazide (HYDRODIURIL) 25 MG tablet Take 1 tablet (25 mg total) by mouth daily. 90 tablet 3  . leflunomide (ARAVA) 20 MG tablet Take 20 mg by mouth daily.    Marland Kitchen lisinopril (PRINIVIL,ZESTRIL) 20 MG tablet Take 0.5-1 tablets (10-20 mg total) by mouth daily. 30 tablet 11  . metoprolol succinate (TOPROL-XL) 25 MG 24 hr tablet Take 1 tablet (25 mg total) by mouth daily. 30 tablet 3  . PROAIR HFA 108 (90 Base) MCG/ACT inhaler     . umeclidinium-vilanterol (ANORO ELLIPTA) 62.5-25 MCG/INH AEPB Inhale 1 puff into the lungs daily.    Marland Kitchen VITAMIN D, CHOLECALCIFEROL, PO Take 1 tablet by mouth daily.    . predniSONE (DELTASONE) 10 MG tablet Take 1 tablet by mouth daily.      No current facility-administered medications for this visit.     Review of Systems  Constitutional: Positive for fatigue.  HENT: HENT negative.  Eyes: Eyes negative.  Respiratory: Respiratory negative.  Cardiovascular: Positive for irregular heartbeat  and leg swelling.  GI: Gastrointestinal negative.  Musculoskeletal: Musculoskeletal negative.  Skin: Skin negative.  Neurological: Neurological negative. Hematologic: Hematologic/lymphatic negative.  Psychiatric: Psychiatric negative.        Objective:  Objective   Vitals:   10/24/16 1146  BP: 137/84  Pulse: 87  Resp: 18  Temp: 97.2 F (36.2 C)  TempSrc: Oral  SpO2: 96%  Weight: 224 lb (101.6 kg)  Height: 5\' 7"  (1.702 m)   Body mass index is 35.08 kg/m.  Physical Exam  Constitutional: She is oriented to person, place, and time. She appears well-developed.  HENT:  Head: Atraumatic.  Eyes: EOM are normal.  Neck: Normal range of  motion. Neck supple.  Cardiovascular: Normal rate.   Pulses:      Dorsalis pedis pulses are 2+ on the right side, and 2+ on the left side.  Pulmonary/Chest: Effort normal.  Abdominal: Soft. She exhibits no mass.  Musculoskeletal: Normal range of motion. She exhibits edema.  Neurological: She is alert and oriented to person, place, and time.  Skin: Skin is warm and dry.  Psychiatric: She has a normal mood and affect. Her behavior is normal. Judgment and thought content normal.    Data: Bilateral lower extremity is demonstrated triphasic waveforms ABIs greater than 1 bilaterally.     Assessment/Plan:     82 year old white female with multiple medical comorbidities and bilateral foot swelling. She does have some improvement of her swelling while taking her diuretic and with elevating her legs. At this time I would not pursue any intervention for her lower extremities given her age and other comorbidities and so I do not think venous testing is merited And her swelling is likely multifactorial. She does have palpable DP pulses bilaterally as well as triphasic waveforms. From that she is safe for 20-30 mmHg compression stockings and I think knee-high is fine given that her swelling is mostly located in her feet. She does not need specific follow-up with 82 although we are here if she does have issues or questions arise.     [place] MD [facility]

## 2016-10-27 ENCOUNTER — Telehealth: Payer: Self-pay | Admitting: Cardiology

## 2016-10-27 NOTE — Telephone Encounter (Signed)
Pt's daughter would like to speak w/ C. Carlton--please give her a call @ [phone]

## 2016-10-27 NOTE — Telephone Encounter (Signed)
Pt has not wanted to eat for past 2 weeks ,advised to see pcp. She told daughter she wanted to go to the lawyer and get it corrected.She is going to see pcp

## 2016-10-29 ENCOUNTER — Emergency Department (HOSPITAL_COMMUNITY): Payer: Medicare Other

## 2016-10-29 ENCOUNTER — Encounter (HOSPITAL_COMMUNITY): Payer: Self-pay | Admitting: Emergency Medicine

## 2016-10-29 ENCOUNTER — Emergency Department (HOSPITAL_COMMUNITY)
Admission: EM | Admit: 2016-10-29 | Discharge: 2016-10-29 | Disposition: A | Discharge status: Home / Self Care | Payer: Medicare Other | Attending: Emergency Medicine | Admitting: Emergency Medicine

## 2016-10-29 DIAGNOSIS — R531 Weakness: Secondary | ICD-10-CM

## 2016-10-29 DIAGNOSIS — R197 Diarrhea, unspecified: Secondary | ICD-10-CM | POA: Insufficient documentation

## 2016-10-29 DIAGNOSIS — M6281 Muscle weakness (generalized): Secondary | ICD-10-CM | POA: Diagnosis not present

## 2016-10-29 DIAGNOSIS — I10 Essential (primary) hypertension: Secondary | ICD-10-CM | POA: Diagnosis not present

## 2016-10-29 DIAGNOSIS — N3000 Acute cystitis without hematuria: Secondary | ICD-10-CM | POA: Diagnosis not present

## 2016-10-29 DIAGNOSIS — J449 Chronic obstructive pulmonary disease, unspecified: Secondary | ICD-10-CM | POA: Diagnosis not present

## 2016-10-29 DIAGNOSIS — Z79899 Other long term (current) drug therapy: Secondary | ICD-10-CM | POA: Insufficient documentation

## 2016-10-29 LAB — BASIC METABOLIC PANEL
ANION GAP: 9 (ref 5–15)
BUN: 18 mg/dL (ref 6–20)
CO2: 27 mmol/L (ref 22–32)
Calcium: 9 mg/dL (ref 8.9–10.3)
Chloride: 103 mmol/L (ref 101–111)
Creatinine, Ser: 1.09 mg/dL — ABNORMAL HIGH (ref 0.44–1.00)
GFR calc Af Amer: 54 mL/min — ABNORMAL LOW (ref >60)
GFR, EST NON AFRICAN AMERICAN: 46 mL/min — AB (ref >60)
Glucose, Bld: 124 mg/dL — ABNORMAL HIGH (ref 65–99)
POTASSIUM: 3.8 mmol/L (ref 3.5–5.1)
SODIUM: 139 mmol/L (ref 135–145)

## 2016-10-29 LAB — CBC
HEMATOCRIT: 43.7 % (ref 36.0–46.0)
Hemoglobin: 13.6 g/dL (ref 12.0–15.0)
MCH: 31.1 pg (ref 26.0–34.0)
MCHC: 31.1 g/dL (ref 30.0–36.0)
MCV: 99.8 fL (ref 78.0–100.0)
PLATELETS: 201 10*3/uL (ref 150–400)
RBC: 4.38 MIL/uL (ref 3.87–5.11)
RDW: 12.4 % (ref 11.5–15.5)
WBC: 7.7 10*3/uL (ref 4.0–10.5)

## 2016-10-29 LAB — URINALYSIS, MICROSCOPIC (REFLEX)

## 2016-10-29 LAB — URINALYSIS, ROUTINE W REFLEX MICROSCOPIC
BILIRUBIN URINE: NEGATIVE
Glucose, UA: NEGATIVE mg/dL
Hgb urine dipstick: NEGATIVE
KETONES UR: NEGATIVE mg/dL
NITRITE: NEGATIVE
PROTEIN: NEGATIVE mg/dL
SPECIFIC GRAVITY, URINE: 1.025 (ref 1.005–1.030)
pH: 5.5 (ref 5.0–8.0)

## 2016-10-29 LAB — TROPONIN I: Troponin I: <0.03 ng/mL (ref <0.03)

## 2016-10-29 MED ORDER — CEPHALEXIN 500 MG PO CAPS
500.0000 mg | ORAL_CAPSULE | Freq: Once | ORAL | Status: AC
  Administered 2016-10-29: 500 mg via ORAL
  Filled 2016-10-29: qty 1

## 2016-10-29 MED ORDER — CEPHALEXIN 250 MG PO CAPS: 250.0000 mg | ORAL_CAPSULE | Freq: Four times a day (QID) | ORAL | 0 refills | Status: AC

## 2016-10-29 NOTE — Discharge Instructions (Signed)
Take the antibiotics as prescribed. Follow-up with your primary doctor next week to make sure you're improving

## 2016-10-29 NOTE — ED Triage Notes (Signed)
Patient complains of weakness and bilateral leg swelling. Patient was checked for blockages in legs last Friday with negative results. Patient states shortness of breath with history of COPD.

## 2016-10-29 NOTE — ED Provider Notes (Signed)
[facility]-EM[facility]Y DEPT Provider Note   CSN: [id number] Arrival date & time: 10/29/16  1042  By signing my name below, I, Doreatha Martin, attest that this documentation has been prepared under the direction and in the presence of Linwood Dibbles, MD. Electronically Signed: Doreatha Martin, ED Scribe. 10/29/16. 12:41 PM.     History   Chief Complaint Chief Complaint  Patient presents with  . Weakness  . Leg Swelling    HPI Courtney Berry is a 82 y.o. female with h/o Afib, COPD, HTN, rheumatoid arthritis who presents to the Emergency Department complaining of moderate generalized weakness onset last week. Pt also reports intermittent diarrhea (x3-4/day), dysuria. She also notes improving bilateral LE swelling for over a week, for which she was seen by her PCP and cardiologist and has been taking a diuretic. Family states pt was recently started on a new medication, Arava, for management of rheumatoid arthritis in 09/2016, and report her symptoms began after this change. No alleviating factors noted. She denies vomiting, fever, CP, SOB.    The history is provided by the patient and a relative. No language interpreter was used.    Past Medical History:  Diagnosis Date  . A-fib (HCC)   . COPD (chronic obstructive pulmonary disease) (HCC)   . Diverticulosis   . Essential hypertension, benign   . History of chest pain    Low risk Cardiolite 2010  . Mixed hyperlipidemia   . PAF (paroxysmal atrial fibrillation) (HCC)   . Rheumatoid arthritis [facility])     Patient Active Problem List   Diagnosis Date Noted  . Rheumatoid arthritis involving multiple sites with positive rheumatoid factor (HCC) 10/20/2016  . Peripheral vascular disease (HCC) 10/20/2016  . Atrial fibrillation with rapid ventricular response (HCC) 08/01/2016  . Atrial fibrillation with RVR (HCC) 08/01/2016  . Bradycardia 08/01/2016  . Atrial fibrillation, new onset (HCC) 02/14/2016  . Chronic anticoagulation 02/14/2016  . Palpitations  03/02/2012  . Dyslipidemia 09/03/2009  . Essential hypertension 09/03/2009  . Shortness of breath 09/03/2009    Past Surgical History:  Procedure Laterality Date  . TOTAL ABDOMINAL HYSTERECTOMY  1969    OB History    No data available       Home Medications    Prior to Admission medications   Medication Sig Start Date End Date Taking? Authorizing Provider  acetaminophen (TYLENOL) 500 MG tablet Take 1,000 mg by mouth every 6 (six) hours as needed for moderate pain.   Yes Historical Provider, MD  apixaban (ELIQUIS) 5 MG TABS tablet Take 1 tablet (5 mg total) by mouth 2 (two) times daily. 03/11/16  Yes Jonelle Sidle, MD  leflunomide (ARAVA) 20 MG tablet Take 20 mg by mouth daily. 10/02/16  Yes Historical Provider, MD  lisinopril (PRINIVIL,ZESTRIL) 30 MG tablet Take 15 mg by mouth daily.   Yes Historical Provider, MD  metoprolol succinate (TOPROL-XL) 25 MG 24 hr tablet Take 1 tablet (25 mg total) by mouth daily. 03/09/13  Yes Rollene Rotunda, MD  PROAIR HFA 108 [phone] Base) MCG/ACT inhaler  09/23/16  Yes Historical Provider, MD  umeclidinium-vilanterol (ANORO ELLIPTA) 62.5-25 MCG/INH AEPB Inhale 1 puff into the lungs daily.   Yes Historical Provider, MD  VITAMIN D, CHOLECALCIFEROL, PO Take 1 tablet by mouth daily.   Yes Historical Provider, MD  cephALEXin (KEFLEX) 250 MG capsule Take 1 capsule (250 mg total) by mouth 4 (four) times daily. 10/29/16 11/05/16  Linwood Dibbles, MD  hydrochlorothiazide (HYDRODIURIL) 25 MG tablet Take 1 tablet (25 mg total)  by mouth daily. Patient taking differently: Take 12.5 mg by mouth daily.  10/20/16   Remus Loffler, PA-C    Family History Family History  Problem Relation Age of Onset  . Cancer Father   . Cancer Mother   . Heart disease Sister     CABG age 39  . Cancer Brother     Social History Social History  Substance Use Topics  . Smoking status: Never Smoker  . Smokeless tobacco: Never Used     Comment: Tobacco use-no  . Alcohol use No      Allergies   Darvocet [propoxyphene n-acetaminophen]; Nitrofurantoin monohyd macro; Plavix [clopidogrel bisulfate]; Sulfa antibiotics; and Penicillins   Review of Systems Review of Systems  Respiratory: Negative for shortness of breath.   Cardiovascular: Positive for leg swelling. Negative for chest pain.  Gastrointestinal: Positive for diarrhea. Negative for vomiting.  Genitourinary: Positive for dysuria.  Neurological: Positive for weakness.  All other systems reviewed and are negative.    Physical Exam Updated Vital Signs BP 138/65 (BP Location: Left Arm)   Pulse 86   Temp 98 F (36.7 C) (Oral)   Resp 19   Ht 5\' 7"  (1.702 m)   Wt 102.1 kg   SpO2 97%   BMI 35.24 kg/m   Physical Exam  Constitutional: No distress.  Elderly  HENT:  Head: Normocephalic and atraumatic.  Right Ear: External ear normal.  Left Ear: External ear normal.  Eyes: Conjunctivae are normal. Right eye exhibits no discharge. Left eye exhibits no discharge. No scleral icterus.  Neck: Neck supple. No tracheal deviation present.  Cardiovascular: Normal rate, regular rhythm and intact distal pulses.   Pulmonary/Chest: Effort normal and breath sounds normal. No stridor. No respiratory distress. She has no wheezes. She has no rales.  Abdominal: Soft. Bowel sounds are normal. She exhibits no distension. There is no tenderness. There is no rebound and no guarding.  Musculoskeletal: She exhibits edema. She exhibits no tenderness.  Pitting edema BLE below the knees. Bruise noted on dorsal aspect of the left foot (Pt states she kicked a rocking chair yesterday).   Neurological: She is alert. She has normal strength. No cranial nerve deficit (no facial droop, extraocular movements intact, no slurred speech) or sensory deficit. She exhibits normal muscle tone. She displays no seizure activity. Coordination normal.  Skin: Skin is warm and dry. No rash noted. She is not diaphoretic.  Psychiatric: She has a  normal mood and affect.  Nursing note and vitals reviewed.    ED Treatments / Results   DIAGNOSTIC STUDIES: Oxygen Saturation is 97% on RA, normal by my interpretation.    COORDINATION OF CARE: 12:28 PM Discussed treatment plan with pt at bedside which includes lab work, CXR and pt agreed to plan.    Labs (all labs ordered are listed, but only abnormal results are displayed) Labs Reviewed  BASIC METABOLIC PANEL - Abnormal; Notable for the following:       Result Value   Glucose, Bld 124 (*)    Creatinine, Ser 1.09 (*)    GFR calc non Af Amer 46 (*)    GFR calc Af Amer 54 (*)    All other components within normal limits  URINALYSIS, ROUTINE W REFLEX MICROSCOPIC - Abnormal; Notable for the following:    Leukocytes, UA TRACE (*)    All other components within normal limits  URINALYSIS, MICROSCOPIC (REFLEX) - Abnormal; Notable for the following:    Bacteria, UA MANY (*)    Squamous  Epithelial / LPF TOO NUMEROUS TO COUNT (*)    All other components within normal limits  CBC  TROPONIN I    EKG  EKG Interpretation  Date/Time:  Wednesday October 29 2016 11:14:24 EST Ventricular Rate:  82 PR Interval:    QRS Duration: 87 QT Interval:  386 QTC Calculation: 451 R Axis:   44 Text Interpretation:  Sinus rhythm Low voltage, precordial leads Abnormal R-wave progression, early transition Baseline wander in lead(s) V1 bigeminy resolved since last tracing Confirmed by Chanie Soucek  MD-J, Lamond Glantz ([id number]) on 10/29/2016 12:24:46 PM       Radiology Dg Chest 2 View  Result Date: 10/29/2016 CLINICAL DATA:  Weakness, bilateral leg swelling. EXAM: CHEST  2 VIEW COMPARISON:  08/01/2016 FINDINGS: Heart is upper limits normal in size. No confluent airspace opacities or effusions. No acute bony abnormality. IMPRESSION: No active cardiopulmonary disease. Electronically Signed   By: Charlett Nose M.D.   On: 10/29/2016 11:46    Procedures Procedures (including critical care time)  Medications Ordered  in ED Medications  cephALEXin (KEFLEX) capsule 500 mg (not administered)     Initial Impression / Assessment and Plan / ED Course  I have reviewed the triage vital signs and the nursing notes.  Pertinent labs & imaging results that were available during my care of the patient were reviewed by me and considered in my medical decision making (see chart for details).  Clinical Course as of Oct 30 1427  Wed Oct 29, 2016  1354 In and out cath ordered however clean catch specimen provided.  Contaminated specimen however pt did mention urinary frequency and she does have TNTC WBC and many bacteria   [JK]    Clinical Course User Index [JK] Linwood Dibbles, MD    Labs and xrays are reassuring with the exception of her urine.  No sign of any emergency medical condition. Sx could be related to a UTI vs medication side effects .  Patient has mentioned that her symptoms have started since a change in her outpatient medication regimen. Will dc home with oral Keflex. Follow up with her doctor next week to be rechecked.  We discussed that if her symptoms persist despite treatment for her urinary tract infection her doctors may need to consider adjustment of her medication regimen.  Final Clinical Impressions(s) / ED Diagnoses   Final diagnoses:  Acute cystitis without hematuria  Generalized weakness    New Prescriptions New Prescriptions   CEPHALEXIN (KEFLEX) 250 MG CAPSULE    Take 1 capsule (250 mg total) by mouth 4 (four) times daily.     I personally performed the services described in this documentation, which was scribed in my presence.  The recorded information has been reviewed and is accurate.     Linwood Dibbles, MD 10/29/16 [phone]

## 2016-10-29 NOTE — ED Notes (Signed)
In and out cath clicked complete in error. Upon entering the room to perform same the pt states she can urinate and was able to walk to restroom to provide clean catch speciman

## 2016-11-03 DIAGNOSIS — J449 Chronic obstructive pulmonary disease, unspecified: Secondary | ICD-10-CM | POA: Diagnosis not present

## 2016-11-03 DIAGNOSIS — I4891 Unspecified atrial fibrillation: Secondary | ICD-10-CM | POA: Diagnosis not present

## 2016-11-03 DIAGNOSIS — M069 Rheumatoid arthritis, unspecified: Secondary | ICD-10-CM | POA: Diagnosis not present

## 2016-11-03 DIAGNOSIS — I10 Essential (primary) hypertension: Secondary | ICD-10-CM | POA: Diagnosis not present

## 2016-11-07 ENCOUNTER — Telehealth: Payer: Self-pay | Admitting: Physician Assistant

## 2016-11-07 NOTE — Telephone Encounter (Signed)
Appointment made 12/26 at 10:10am with angel.

## 2016-11-11 ENCOUNTER — Ambulatory Visit (INDEPENDENT_AMBULATORY_CARE_PROVIDER_SITE_OTHER): Payer: Medicare Other | Admitting: Physician Assistant

## 2016-11-11 ENCOUNTER — Encounter: Payer: Self-pay | Admitting: Physician Assistant

## 2016-11-11 VITALS — BP 136/69 | HR 79 | Temp 97.1°F | Ht 67.0 in | Wt 228.0 lb

## 2016-11-11 DIAGNOSIS — M150 Primary generalized (osteo)arthritis: Secondary | ICD-10-CM

## 2016-11-11 DIAGNOSIS — J209 Acute bronchitis, unspecified: Secondary | ICD-10-CM | POA: Diagnosis not present

## 2016-11-11 DIAGNOSIS — I890 Lymphedema, not elsewhere classified: Secondary | ICD-10-CM

## 2016-11-11 DIAGNOSIS — R3989 Other symptoms and signs involving the genitourinary system: Secondary | ICD-10-CM | POA: Diagnosis not present

## 2016-11-11 DIAGNOSIS — M159 Polyosteoarthritis, unspecified: Secondary | ICD-10-CM

## 2016-11-11 DIAGNOSIS — N3000 Acute cystitis without hematuria: Secondary | ICD-10-CM

## 2016-11-11 MED ORDER — PREDNISONE 10 MG PO TABS: 10.0000 mg | ORAL_TABLET | Freq: Every day | ORAL | 2 refills | Status: DC

## 2016-11-11 MED ORDER — LEVOFLOXACIN 500 MG PO TABS: 500.0000 mg | ORAL_TABLET | Freq: Every day | ORAL | 0 refills | Status: DC

## 2016-11-11 MED ORDER — PSEUDOEPHEDRINE-CODEINE-GG 30-10-100 MG/5ML PO SOLN: 10.0000 mL | Freq: Four times a day (QID) | ORAL | 0 refills | Status: DC | PRN

## 2016-11-11 NOTE — Patient Instructions (Signed)
Lymphedema Introduction Lymphedema is swelling that is caused by the abnormal collection of lymph under the skin. Lymph is fluid from the tissues in your body that travels in the lymphatic system. This system is part of the immune system and includes lymph nodes and lymph vessels. The lymph vessels collect and carry the excess fluid, fats, proteins, and wastes from the tissues of the body to the bloodstream. This system also works to clean and remove bacteria and waste products from the body. Lymphedema occurs when the lymphatic system is blocked. When the lymph vessels or lymph nodes are blocked or damaged, lymph does not drain properly, causing an abnormal buildup of lymph. This leads to swelling in the arms or legs. Lymphedema cannot be cured by medicines, but various methods can be used to help reduce the swelling. What are the causes? There are two types of lymphedema. Primary lymphedema is caused by the absence or abnormality of the lymph vessel at birth. Secondary lymphedema is more common. It occurs when the lymph vessel is damaged or blocked. Common causes of lymph vessel blockage include:  Skin infection, such as cellulitis.  Infection by parasites (filariasis).  Injury.  Cancer.  Radiation therapy.  Formation of scar tissue.  Surgery. What are the signs or symptoms? Symptoms of this condition include:  Swelling of the arm or leg.  A heavy or tight feeling in the arm or leg.  Swelling of the feet, toes, or fingers. Shoes or rings may fit more tightly than before.  Redness of the skin over the affected area.  Limited movement of the affected limb.  Sensitivity to touch or discomfort in the affected limb. How is this diagnosed? This condition may be diagnosed with:  A physical exam.  Medical history.  Bioimpedance spectroscopy. In this test, painless electrical currents are used to measure fluid levels in your body.  Imaging tests, such as:  Lymphoscintigraphy. In  this test, a low dose of a radioactive substance is injected to trace the flow of lymph through the lymph vessels.  MRI.  CT scan.  Duplex ultrasound. This test uses sound waves to produce images of the vessels and the blood flow on a screen.  Lymphangiography. In this test, a contrast dye is injected into the lymph vessel to help show blockages. How is this treated? Treatment for this condition may depend on the cause. Treatment may include:  Exercise. Certain exercises can help fluid move out of the affected limb.  Massage. Gentle massage of the affected limb can help move the fluid out of the area.  Compression. Various methods may be used to apply pressure to the affected limb in order to reduce the swelling.  Wearing compression stockings or sleeves on the affected limb.  Bandaging the affected limb.  Using an external pump that is attached to a sleeve that alternates between applying pressure and releasing pressure.  Surgery. This is usually only done for severe cases. For example, surgery may be done if you have trouble moving the limb or if the swelling does not get better with other treatments. If an underlying condition is causing the lymphedema, treatment for that condition is needed. For example, antibiotic medicines may be used to treat an infection. Follow these instructions at home: Activities  Exercise regularly as directed by your health care provider.  Do not sit with your legs crossed.  When possible, keep the affected limb raised (elevated) above the level of your heart.  Avoid carrying things with an arm that is   affected by lymphedema.  Remember that the affected area is more likely to become injured or infected.  Take these steps to help prevent infection:  Keep the affected area clean and dry.  Protect your skin from cuts. For example, you should use gloves while cooking or gardening. Do not walk barefoot. If you shave the affected area, use an  electric razor. General instructions  Take medicines only as directed by your health care provider.  Eat a healthy diet that includes a lot of fruits and vegetables.  Do not wear tight clothes, shoes, or jewelry.  Do not use heating pads over the affected area.  Avoid having blood pressure checked on the affected limb.  Keep all follow-up visits as directed by your health care provider. This is important. Contact a health care provider if:  You continue to have swelling in your limb.  You have a fever.  You have a cut that does not heal.  You have redness or pain in the affected area.  You have new swelling in your limb that comes on suddenly.  You develop purplish spots or sores (lesions) on your limb. Get help right away if:  You have a skin rash.  You have chills or sweats.  You have shortness of breath. This information is not intended to replace advice given to you by your health care provider. Make sure you discuss any questions you have with your health care provider. Document Released: 08/31/2007 Document Revised: 07/10/2016 Document Reviewed: 10/11/2014  2017 Elsevier  

## 2016-11-11 NOTE — Progress Notes (Signed)
BP 136/69   Pulse 79   Temp 97.1 F (36.2 C) (Oral)   Ht 5\' 7"  (1.702 m)   Wt 228 lb (103.4 kg)   BMI 35.71 kg/m    Subjective:    Patient ID: , female    DOB: 1933/05/04, 82 y.o.   MRN: [id number]  HPI: Courtney Berry is a 82 y.o. female presenting on 11/11/2016 for Hospitalization Follow-up ([facility], went 12/13 UTI); Urinary Tract Infection (recheck); Foot Swelling (recheck); and Cough  Patient reports that she has completed her course of cephalexin. She is still having some dysuria. She was unable to get a urine sample today because he did not be called in the collection process. She had an episode of diarrhea for several days preceding the beginning of her urinary tract infection. We have discussed that this could be the source of contamination for her urinary tract.  Patient with continued edema in her lower legs. She was evaluated by vascular surgery and the terminal not to have any arterial blockages. She was given a prescription for compression stockings they are still too tight. Edema has not gone down. We'll have her seen through physical therapy for treatment of lymphedema. After this is resolved that she may be able to continue with the compression stockings.  She is also having significant cough and congestion symptoms. She has had headache and ear pressure. She has had some associated dizziness with movement. She is also having slight production whenever she coughs.  Relevant past medical, surgical, family and social history reviewed and updated as indicated. Allergies and medications reviewed and updated.  Past Medical History:  Diagnosis Date  . A-fib (HCC)   . COPD (chronic obstructive pulmonary disease) (HCC)   . Diverticulosis   . Essential hypertension, benign   . History of chest pain    Low risk Cardiolite 2010  . Mixed hyperlipidemia   . PAF (paroxysmal atrial fibrillation) (HCC)   . Rheumatoid arthritis [facility])     Past Surgical History:    Procedure Laterality Date  . TOTAL ABDOMINAL HYSTERECTOMY  1969    Review of Systems  Constitutional: Positive for chills and fatigue. Negative for activity change and appetite change.  HENT: Positive for congestion, postnasal drip and sore throat.   Eyes: Negative.   Respiratory: Positive for cough and wheezing.   Cardiovascular: Positive for leg swelling. Negative for chest pain and palpitations.  Gastrointestinal: Negative.   Genitourinary: Positive for dysuria and frequency.  Musculoskeletal: Positive for arthralgias and joint swelling.  Skin: Negative.   Neurological: Positive for headaches.    .med Allergies  Allergen Reactions  . Darvocet [Propoxyphene N-Acetaminophen] Other (See Comments)    unknown  . Nitrofurantoin Monohyd Macro Other (See Comments)    unknown  . Plavix [Clopidogrel Bisulfate] Swelling    Face swells  . Sulfa Antibiotics Other (See Comments)    "face got puffy"  . Penicillins Rash       Objective:    BP 136/69   Pulse 79   Temp 97.1 F (36.2 C) (Oral)   Ht 5\' 7"  (1.702 m)   Wt 228 lb (103.4 kg)   BMI 35.71 kg/m     Physical Exam  Constitutional: She is oriented to person, place, and time. She appears well-developed and well-nourished.  HENT:  Head: Normocephalic and atraumatic.  Right Ear: Tympanic membrane and external ear normal. No middle ear effusion.  Left Ear: Tympanic membrane and external ear normal.  No middle ear effusion.  Nose: Mucosal edema and rhinorrhea present. Right sinus exhibits no maxillary sinus tenderness. Left sinus exhibits no maxillary sinus tenderness.  Mouth/Throat: Uvula is midline. Posterior oropharyngeal erythema present.  Eyes: Conjunctivae and EOM are normal. Pupils are equal, round, and reactive to light. Right eye exhibits no discharge. Left eye exhibits no discharge.  Neck: Normal range of motion.  Cardiovascular: Normal rate, regular rhythm, normal heart sounds and normal pulses.   Tone scan of the  lower extremities, chronic edema 3+, some firmness to the overall skin. There is no skin breakdown at this time.  Pulmonary/Chest: Effort normal and breath sounds normal. No respiratory distress. She has no wheezes.  Abdominal: Soft.  Lymphadenopathy:    She has no cervical adenopathy.  Neurological: She is alert and oriented to person, place, and time.  Skin: Skin is warm and dry.  Psychiatric: She has a normal mood and affect.        Assessment & Plan:   1. Urine troubles - Urinalysis, Complete  2. Acute cystitis without hematuria - levofloxacin (LEVAQUIN) 500 MG tablet; Take 1 tablet (500 mg total) by mouth daily.  Dispense: 10 tablet; Refill: 0  3. Acute bronchitis, unspecified organism - levofloxacin (LEVAQUIN) 500 MG tablet; Take 1 tablet (500 mg total) by mouth daily.  Dispense: 10 tablet; Refill: 0  4. Lymphedema - Ambulatory referral to Physical Therapy  5. Primary osteoarthritis involving multiple joints - predniSONE (DELTASONE) 10 MG tablet; Take 1 tablet (10 mg total) by mouth daily with breakfast.  Dispense: 30 tablet; Refill: 2   Continue all other maintenance medications as listed above.  Follow up plan: Return in about 4 weeks (around 12/09/2016) for recheck.  Orders Placed This Encounter  Procedures  . Urinalysis, Complete  . Ambulatory referral to Physical Therapy    Educational handout given for lymphedema  Remus Loffler PA-C [facility] [address]  [place], [place] [address] [phone]   11/11/2016, 10:54 AM

## 2016-11-13 ENCOUNTER — Telehealth: Payer: Self-pay | Admitting: Cardiology

## 2016-11-13 ENCOUNTER — Telehealth: Payer: Self-pay | Admitting: Physician Assistant

## 2016-11-13 MED ORDER — FUROSEMIDE 20 MG PO TABS: 20.0000 mg | ORAL_TABLET | Freq: Every day | ORAL | 3 refills | Status: DC

## 2016-11-13 NOTE — Progress Notes (Signed)
Cardiology Office Note   Date:  11/14/2016   ID:  Courtney Berry, DOB 12-08-1932, MRN [id number]  PCP:  Remus Loffler, PA-C  Cardiologist:   Dietrich Pates, MD   Pt presents for eval of LE edema     History of Present Illness: Courtney Berry is a 82 y.o. female with a history of atrial fib  She is usually followed by Liliane Channel  Seen in clinic in Aug 2017 She was seen in [facility] on 12,8  Complalined of bilateral LE edema  Was on steroids for RA  Also noted SOB  Started on HCTZ  Seen back in medicine clinic on 12/26  Also noted cough and congestion  Rx Levaquin for bronchitis and cystities    Echo in April 2017 LVEF was 60 to 65%    Appetite OK  Breathing OK  No CP   No PND     Current Meds  Medication Sig  . acetaminophen (TYLENOL) 500 MG tablet Take 1,000 mg by mouth every 6 (six) hours as needed for moderate pain.  Marland Kitchen apixaban (ELIQUIS) 5 MG TABS tablet Take 1 tablet (5 mg total) by mouth 2 (two) times daily.  . furosemide (LASIX) 20 MG tablet Take 1-2 tablets (20-40 mg total) by mouth daily.  Marland Kitchen levofloxacin (LEVAQUIN) 500 MG tablet Take 1 tablet (500 mg total) by mouth daily.  Marland Kitchen lisinopril (PRINIVIL,ZESTRIL) 30 MG tablet Take 15 mg by mouth daily.  . metoprolol succinate (TOPROL-XL) 25 MG 24 hr tablet Take 1 tablet (25 mg total) by mouth daily.  . predniSONE (DELTASONE) 10 MG tablet Take 1 tablet (10 mg total) by mouth daily with breakfast.  . PROAIR HFA 108 (90 Base) MCG/ACT inhaler   . pseudoephedrine-codeine-guaifenesin (CHERATUSSIN DAC) 30-10-100 MG/5ML solution Take 10 mLs by mouth 4 (four) times daily as needed for cough.  . umeclidinium-vilanterol (ANORO ELLIPTA) 62.5-25 MCG/INH AEPB Inhale 1 puff into the lungs daily.  Marland Kitchen VITAMIN D, CHOLECALCIFEROL, PO Take 1 tablet by mouth daily.     Allergies:   Darvocet [propoxyphene n-acetaminophen]; Nitrofurantoin monohyd macro; Plavix [clopidogrel bisulfate]; Sulfa antibiotics; and Penicillins   Past Medical History:    Diagnosis Date  . A-fib (HCC)   . COPD (chronic obstructive pulmonary disease) (HCC)   . Diverticulosis   . Essential hypertension, benign   . History of chest pain    Low risk Cardiolite 2010  . Mixed hyperlipidemia   . PAF (paroxysmal atrial fibrillation) (HCC)   . Rheumatoid arthritis [facility])     Past Surgical History:  Procedure Laterality Date  . TOTAL ABDOMINAL HYSTERECTOMY  1969     Social History:  The patient  reports that she has never smoked. She has never used smokeless tobacco. She reports that she does not drink alcohol or use drugs.   Family History:  The patient's family history includes Cancer in her brother, father, and mother; Heart disease in her sister.    ROS:  Please see the history of present illness. All other systems are reviewed and  Negative to the above problem except as noted.    PHYSICAL EXAM: VS:  BP 118/76   Pulse 86   Ht 5\' 7"  (1.702 m)   Wt 225 lb (102.1 kg)   SpO2 95%   BMI 35.24 kg/m   GEN: Well nourished, well developed, in no acute distress  HEENT: normal  Neck: no JVD, carotid bruits, or masses Cardiac: RRR; no murmurs, rubs, or gallops,2+  edema with  erythema   Respiratory:  clear to auscultation bilaterally, normal work of breathing GI: soft, nontender, nondistended, + BS  No hepatomegaly  MS: no deformity Moving all extremities   Skin: warm and dry, no rash Neuro:  Strength and sensation are intact Psych: euthymic mood, full affect   EKG:  EKG is not ordered today.   Lipid Panel No results found for: CHOL, TRIG, HDL, CHOLHDL, VLDL, LDLCALC, LDLDIRECT    Wt Readings from Last 3 Encounters:  11/14/16 225 lb (102.1 kg)  11/11/16 228 lb (103.4 kg)  10/29/16 225 lb (102.1 kg)      ASSESSMENT AND PLAN:  1  LE edema  New  Pt says about 2 months  Denies SOB  No CP  No PND  No Change in diet   Recomm:  Labs (CBC, BNP, CMET, TSH)  2  Echo to reeval LVEF and diastolic parameters 3  1.5 to 2 L fluid per day  4.  Low NA  5   If above unrevealing then consider abdominal scan to eval for masses, anything that could impede venous return Pt has lasix  She can take instead of HCTZ today and follow response  Pt has f/u already with Ival Bible next wk  Keep appt    Current medicines are reviewed at length with the patient today.  The patient does not have concerns regarding medicines.  Signed, Dietrich Pates, MD  11/14/2016 8:58 AM    [facility] [address] [place], [place], [place]  [address] Phone: [phone]; Fax: [phone]

## 2016-11-13 NOTE — Telephone Encounter (Signed)
Pt's daughter notified of recommendation Verbalizes understanding 

## 2016-11-13 NOTE — Telephone Encounter (Signed)
She was seen in IM earlier this week  Thought she had lymphedema in legs Before making any  Changes in regimen she would need to be seen in cardiology clinic

## 2016-11-13 NOTE — Telephone Encounter (Signed)
Will forward to Dr. Ross.  

## 2016-11-13 NOTE — Telephone Encounter (Signed)
Patient seen Courtney Berry 12/26 for bilateral feet swelling. Daughter states that since then her feet swelling is getting worse. [name] states that patients feet are hot, burning and very painful. Patient has been putting Vaseline on them so they won't crack. Please advise and send back to the pools.

## 2016-11-13 NOTE — Telephone Encounter (Signed)
See note

## 2016-11-13 NOTE — Telephone Encounter (Signed)
Daughter wants to see Dr Diona Browner next week as planned,declined apt tomorrow.States pt has been on HCTZ for past 2 weeks by pcp and now they want to wait and  With Dr Diona Browner

## 2016-11-14 ENCOUNTER — Other Ambulatory Visit (HOSPITAL_COMMUNITY)
Admission: RE | Admit: 2016-11-14 | Discharge: 2016-11-14 | Disposition: A | Discharge status: Home / Self Care | Payer: Medicare Other | Source: Ambulatory Visit | Attending: Internal Medicine | Admitting: Internal Medicine

## 2016-11-14 ENCOUNTER — Ambulatory Visit: Payer: Medicare Other | Admitting: Internal Medicine

## 2016-11-14 ENCOUNTER — Encounter: Payer: Self-pay | Admitting: Internal Medicine

## 2016-11-14 ENCOUNTER — Ambulatory Visit (INDEPENDENT_AMBULATORY_CARE_PROVIDER_SITE_OTHER): Payer: Medicare Other | Admitting: Internal Medicine

## 2016-11-14 VITALS — BP 118/76 | HR 86 | Ht 67.0 in | Wt 225.0 lb

## 2016-11-14 DIAGNOSIS — R600 Localized edema: Secondary | ICD-10-CM

## 2016-11-14 LAB — COMPREHENSIVE METABOLIC PANEL
ALBUMIN: 3.1 g/dL — AB (ref 3.5–5.0)
ALT: 10 U/L — ABNORMAL LOW (ref 14–54)
ANION GAP: 8 (ref 5–15)
AST: 17 U/L (ref 15–41)
Alkaline Phosphatase: 65 U/L (ref 38–126)
BILIRUBIN TOTAL: 0.8 mg/dL (ref 0.3–1.2)
BUN: 15 mg/dL (ref 6–20)
CHLORIDE: 101 mmol/L (ref 101–111)
CO2: 29 mmol/L (ref 22–32)
Calcium: 9.1 mg/dL (ref 8.9–10.3)
Creatinine, Ser: 1.07 mg/dL — ABNORMAL HIGH (ref 0.44–1.00)
GFR calc Af Amer: 55 mL/min — ABNORMAL LOW (ref >60)
GFR calc non Af Amer: 47 mL/min — ABNORMAL LOW (ref >60)
GLUCOSE: 111 mg/dL — AB (ref 65–99)
Potassium: 3.9 mmol/L (ref 3.5–5.1)
SODIUM: 138 mmol/L (ref 135–145)
TOTAL PROTEIN: 6.8 g/dL (ref 6.5–8.1)

## 2016-11-14 LAB — CBC
HCT: 43.8 % (ref 36.0–46.0)
Hemoglobin: 13.5 g/dL (ref 12.0–15.0)
MCH: 30.6 pg (ref 26.0–34.0)
MCHC: 30.8 g/dL (ref 30.0–36.0)
MCV: 99.3 fL (ref 78.0–100.0)
PLATELETS: 210 10*3/uL (ref 150–400)
RBC: 4.41 MIL/uL (ref 3.87–5.11)
RDW: 12 % (ref 11.5–15.5)
WBC: 9.1 10*3/uL (ref 4.0–10.5)

## 2016-11-14 LAB — TSH: TSH: 2.025 u[IU]/mL (ref 0.350–4.500)

## 2016-11-14 LAB — BRAIN NATRIURETIC PEPTIDE: B Natriuretic Peptide: 28 pg/mL (ref 0.0–100.0)

## 2016-11-14 NOTE — Patient Instructions (Addendum)
Your physician recommends that you schedule a follow-up appointment in: with Dr Diona Browner as planned 11/21/16 at 10 am     Your physician has requested that you have an echocardiogram. Echocardiography is a painless test that uses sound waves to create images of your heart. It provides your doctor with information about the size and shape of your heart and how well your heart's chambers and valves are working. This procedure takes approximately one hour. There are no restrictions for this procedure.  Use low sodium diet  You may use lasix and HCTZ as needed    Lab work at hospital today     Thank you for choosing [facility] !

## 2016-11-19 ENCOUNTER — Ambulatory Visit (HOSPITAL_COMMUNITY)
Admission: RE | Admit: 2016-11-19 | Discharge: 2016-11-19 | Disposition: A | Discharge status: Home / Self Care | Payer: Medicare Other | Source: Ambulatory Visit | Attending: Internal Medicine | Admitting: Internal Medicine

## 2016-11-19 DIAGNOSIS — I071 Rheumatic tricuspid insufficiency: Secondary | ICD-10-CM | POA: Insufficient documentation

## 2016-11-19 DIAGNOSIS — E785 Hyperlipidemia, unspecified: Secondary | ICD-10-CM | POA: Diagnosis not present

## 2016-11-19 DIAGNOSIS — Z7901 Long term (current) use of anticoagulants: Secondary | ICD-10-CM | POA: Insufficient documentation

## 2016-11-19 DIAGNOSIS — R600 Localized edema: Secondary | ICD-10-CM | POA: Diagnosis not present

## 2016-11-19 DIAGNOSIS — I4891 Unspecified atrial fibrillation: Secondary | ICD-10-CM | POA: Diagnosis not present

## 2016-11-19 DIAGNOSIS — I10 Essential (primary) hypertension: Secondary | ICD-10-CM | POA: Insufficient documentation

## 2016-11-19 DIAGNOSIS — I371 Nonrheumatic pulmonary valve insufficiency: Secondary | ICD-10-CM | POA: Diagnosis not present

## 2016-11-19 LAB — ECHOCARDIOGRAM COMPLETE
AVLVOTPG: 5 mmHg
CHL CUP MV DEC (S): 320
CHL CUP STROKE VOLUME: 25 mL
E/e' ratio: 10.78
EWDT: 320 ms
FS: 33 % (ref 28–44)
IV/PV OW: 1.14
LA diam end sys: 35 mm
LA vol A4C: 43.5 ml
LA vol index: 15.6 mL/m2
LADIAMINDEX: 1.56 cm/m2
LASIZE: 35 mm
LAVOL: 34.8 mL
LDCA: 2.84 cm2
LV E/e' medial: 10.78
LV E/e'average: 10.78
LV SIMPSON'S DISK: 64
LV TDI E'LATERAL: 7.29
LV dias vol: 38 mL — AB (ref 46–106)
LV sys vol: 14 mL
LVDIAVOLIN: 17 mL/m2
LVELAT: 7.29 cm/s
LVOT SV: 72 mL
LVOT VTI: 25.4 cm
LVOT diameter: 19 mm
LVOT peak vel: 108 cm/s
LVSYSVOLIN: 6 mL/m2
MV pk A vel: 117 m/s
MV pk E vel: 78.6 m/s
MVPG: 2 mmHg
PW: 13.7 mm — AB (ref 0.6–1.1)
RV LATERAL S' VELOCITY: 12.4 cm/s
TAPSE: 17.5 mm
TDI e' medial: 6.09

## 2016-11-19 NOTE — Progress Notes (Signed)
*  PRELIMINARY RESULTS* Echocardiogram 2D Echocardiogram has been performed.  [name] 11/19/2016, 1:56 PM

## 2016-11-21 ENCOUNTER — Encounter: Payer: Self-pay | Admitting: Cardiology

## 2016-11-21 ENCOUNTER — Ambulatory Visit (INDEPENDENT_AMBULATORY_CARE_PROVIDER_SITE_OTHER): Payer: Medicare Other | Admitting: Cardiology

## 2016-11-21 VITALS — BP 130/74 | HR 69 | Ht 67.0 in | Wt 224.0 lb

## 2016-11-21 DIAGNOSIS — R600 Localized edema: Secondary | ICD-10-CM | POA: Diagnosis not present

## 2016-11-21 DIAGNOSIS — I10 Essential (primary) hypertension: Secondary | ICD-10-CM

## 2016-11-21 DIAGNOSIS — I480 Paroxysmal atrial fibrillation: Secondary | ICD-10-CM | POA: Diagnosis not present

## 2016-11-21 DIAGNOSIS — I890 Lymphedema, not elsewhere classified: Secondary | ICD-10-CM | POA: Diagnosis not present

## 2016-11-21 NOTE — Patient Instructions (Addendum)
Your physician recommends that you schedule a follow-up appointment in: 1 month   Take lasix 40 mg DAILY for 1 week, then go back to how you were taking it    Schedule Ct abdomen and pelvis       Thank you for choosing [facility] !

## 2016-11-21 NOTE — Addendum Note (Signed)
Addended by: Marlyn Corporal A on: 11/21/2016 11:19 AM   Modules accepted: Orders

## 2016-11-21 NOTE — Progress Notes (Signed)
Cardiology Office Note  Date: 11/21/2016   ID: Courtney Berry, DOB 1933/06/20, MRN [id number]  PCP: Remus Loffler, PA-C  Primary Cardiologist: Nona Dell, MD   Chief Complaint  Patient presents with  . Leg Swelling  . PAF    History of Present Illness: Courtney Berry is an 83 y.o. female seen just recently in the office on December 29 by Dr. Tenny Craw. At that time she was reporting leg edema, several laboratory studies were obtained as well as a follow-up echocardiogram per Dr. Tenny Craw. Lab work was largely unrevealing in terms of an acute process and her LVEF remained in normal range with evidence of only mild diastolic dysfunction. Dr. Tenny Craw recommended an abdominal and pelvic CT to rule out any obstructive process affecting venous return - it does not appear that this was ordered.  I see that she was also evaluated by Dr. Randie Heinz with [facility]. She had normal ABIs. It was not felt that she required venous intervention at that point she was prescribed compression stockings as well as continued on Lasix.  She comes in today with her husband. States that her legs have less swelling, but have not resolved. She has been taking Lasix 20 mg alternating with 40 mg every other day, sometimes using the higher dose less frequently. She has been concerned that certain medications of been causing her leg swelling, but she is now off of these (treatment of RA).  Past Medical History:  Diagnosis Date  . A-fib (HCC)   . COPD (chronic obstructive pulmonary disease) (HCC)   . Diverticulosis   . Essential hypertension, benign   . History of chest pain    Low risk Cardiolite 2010  . Mixed hyperlipidemia   . PAF (paroxysmal atrial fibrillation) (HCC)   . Rheumatoid arthritis [facility])     Past Surgical History:  Procedure Laterality Date  . TOTAL ABDOMINAL HYSTERECTOMY  1969    Current Outpatient Prescriptions  Medication Sig Dispense Refill  . acetaminophen (TYLENOL) 500 MG tablet Take 1,000 mg by  mouth every 6 (six) hours as needed for moderate pain.    Marland Kitchen apixaban (ELIQUIS) 5 MG TABS tablet Take 1 tablet (5 mg total) by mouth 2 (two) times daily. 60 tablet 3  . furosemide (LASIX) 20 MG tablet Take 1-2 tablets (20-40 mg total) by mouth daily. 40 tablet 3  . lisinopril (PRINIVIL,ZESTRIL) 30 MG tablet Take 15 mg by mouth daily.    . metoprolol succinate (TOPROL-XL) 25 MG 24 hr tablet Take 1 tablet (25 mg total) by mouth daily. 30 tablet 3  . predniSONE (DELTASONE) 10 MG tablet Take 1 tablet (10 mg total) by mouth daily with breakfast. 30 tablet 2  . PROAIR HFA 108 (90 Base) MCG/ACT inhaler     . pseudoephedrine-codeine-guaifenesin (CHERATUSSIN DAC) 30-10-100 MG/5ML solution Take 10 mLs by mouth 4 (four) times daily as needed for cough. 120 mL 0  . umeclidinium-vilanterol (ANORO ELLIPTA) 62.5-25 MCG/INH AEPB Inhale 1 puff into the lungs daily.    Marland Kitchen VITAMIN D, CHOLECALCIFEROL, PO Take 1 tablet by mouth daily.     No current facility-administered medications for this visit.    Allergies:  Darvocet [propoxyphene n-acetaminophen]; Nitrofurantoin monohyd macro; Plavix [clopidogrel bisulfate]; Sulfa antibiotics; and Penicillins   Social History: The patient  reports that she has never smoked. She has never used smokeless tobacco. She reports that she does not drink alcohol or use drugs.   ROS:  Please see the history of present illness. Otherwise, complete  review of systems is positive for none.  All other systems are reviewed and negative.   Physical Exam: VS:  BP 130/74   Pulse 69   Ht 5\' 7"  (1.702 m)   Wt 224 lb (101.6 kg)   SpO2 95%   BMI 35.08 kg/m , BMI Body mass index is 35.08 kg/m.  Wt Readings from Last 3 Encounters:  11/21/16 224 lb (101.6 kg)  11/14/16 225 lb (102.1 kg)  11/11/16 228 lb (103.4 kg)    General: Patient appears comfortable at rest. HEENT: Conjunctiva and lids normal, oropharynx clear with moist mucosa. Neck: Supple, no elevated JVP or carotid bruits, no  thyromegaly. Lungs: Clear to auscultation, nonlabored breathing at rest. Cardiac: Regular rate and rhythm, no S3 or significant systolic murmur, no pericardial rub. Abdomen: Soft, nontender, no hepatomegaly, bowel sounds present, no guarding or rebound. Extremities: 2+ lower leg edema and generalized erythema with some pebbling of the skin and extension into the foot suggestive of lymphedema, distal pulses 2+. Skin: Warm and dry. Musculoskeletal: No kyphosis. Neuropsychiatric: Alert and oriented x3, affect grossly appropriate.  ECG: I personally reviewed the tracing from 10/29/2016 which showed sinus rhythm with borderline low voltage.  Recent Labwork: 11/14/2016: ALT 10; AST 17; B Natriuretic Peptide 28.0; BUN 15; Creatinine, Ser 1.07; Hemoglobin 13.5; Platelets 210; Potassium 3.9; Sodium 138; TSH 2.025   Other Studies Reviewed Today:  Echocardiogram 11/19/2016: Study Conclusions  - Left ventricle: The cavity size was normal. Wall thickness was   increased in a pattern of mild LVH. Systolic function was normal.   The estimated ejection fraction was in the range of 60% to 65%.   Doppler parameters are consistent with abnormal left ventricular   relaxation (grade 1 diastolic dysfunction).  Assessment and Plan:  1. Leg edema consistent with lymphedema on examination as well as component of venous stasis. I have recommended that she increase her Lasix to 40 mg daily to try and get her legs to the point that she can use her compression stockings, as mechanical compression will probably be of benefit. Recent lab work and echocardiogram did not point toward another acute process. We will go ahead and obtain an abdominal and pelvic CT as suggested by Dr. 01/17/2017 mainly to exclude any compressive process that may be inhibiting venous return. Follow-up arranged.  2. Paroxysmal atrial fibrillation, she continued on Eliquis and Toprol-XL.  3. Essential hypertension, blood pressure is adequately  controlled today on lisinopril.  4. Rheumatoid arthritis. She uses prednisone occasionally, but is not on standing dose.  Current medicines were reviewed with the patient today.   Orders Placed This Encounter  Procedures  . CT ABDOMEN PELVIS W WO CONTRAST    Disposition: Follow-up in one month.  Signed, 06-22-1989, MD, [facility] 11/21/2016 10:35 AM    [facility] at [facility] [address] [place], [place] Phone: [phone]; Fax: [phone]

## 2016-11-24 ENCOUNTER — Encounter: Payer: Self-pay | Admitting: Physician Assistant

## 2016-11-24 ENCOUNTER — Ambulatory Visit (INDEPENDENT_AMBULATORY_CARE_PROVIDER_SITE_OTHER): Payer: Medicare Other | Admitting: Physician Assistant

## 2016-11-24 VITALS — BP 96/50 | HR 74 | Temp 96.8°F | Ht 67.0 in | Wt 224.2 lb

## 2016-11-24 DIAGNOSIS — M0579 Rheumatoid arthritis with rheumatoid factor of multiple sites without organ or systems involvement: Secondary | ICD-10-CM | POA: Diagnosis not present

## 2016-11-24 DIAGNOSIS — I739 Peripheral vascular disease, unspecified: Secondary | ICD-10-CM | POA: Diagnosis not present

## 2016-11-24 DIAGNOSIS — I952 Hypotension due to drugs: Secondary | ICD-10-CM

## 2016-11-24 DIAGNOSIS — M79662 Pain in left lower leg: Secondary | ICD-10-CM

## 2016-11-24 DIAGNOSIS — M79661 Pain in right lower leg: Secondary | ICD-10-CM | POA: Diagnosis not present

## 2016-11-24 MED ORDER — HYDROCHLOROTHIAZIDE 25 MG PO TABS: 25.0000 mg | ORAL_TABLET | Freq: Every day | ORAL | 3 refills | Status: DC

## 2016-11-24 MED ORDER — ACETAMINOPHEN-CODEINE #3 300-30 MG PO TABS: 1.0000 | ORAL_TABLET | Freq: Four times a day (QID) | ORAL | 0 refills | Status: DC | PRN

## 2016-11-24 NOTE — Progress Notes (Signed)
BP (!) 96/50   Pulse 74   Temp (!) 96.8 F (36 C) (Oral)   Ht 5\' 7"  (1.702 m)   Wt 224 lb 3.2 oz (101.7 kg)   BMI 35.11 kg/m    Subjective:    Patient ID: , female    DOB: [birth year], 83 y.o.   MRN: [id number]  HPI: Courtney Berry is a 83 y.o. female presenting on 11/24/2016 for Edema The patient continues with lower leg edema. At this point she has had a evaluation by peripheral vascular specialist of the lower legs and found to have normal circulation. She also had cardiovascular evaluation and echocardiogram was normal. They have scheduled a CT of her abdomen to see if there is some circulatory blockage in that area. This will be performed early next week. She continues with significant leg pain and edema. She is willing to try some medication for pain. I do not feel that she would have much relief from gabapentin and a short amount of time. Therefore we will try Tylenol with Codeine for short-term pain relief. All of her medications are reviewed today.   Past Medical History:  Diagnosis Date  . A-fib (HCC)   . COPD (chronic obstructive pulmonary disease) (HCC)   . Diverticulosis   . Essential hypertension, benign   . History of chest pain    Low risk Cardiolite 2010  . Mixed hyperlipidemia   . PAF (paroxysmal atrial fibrillation) (HCC)   . Rheumatoid arthritis (HCC)    Relevant past medical, surgical, family and social history reviewed and updated as indicated. Interim medical history since our last visit reviewed. Allergies and medications reviewed and updated. DATA REVIEWED: CHART IN EPIC  Social History   Social History  . Marital status: Married    Spouse name: N/A  . Number of children: N/A  . Years of education: N/A   Occupational History  . Retired    Social History Main Topics  . Smoking status: Never Smoker  . Smokeless tobacco: Never Used     Comment: Tobacco use-no  . Alcohol use No  . Drug use: No  . Sexual activity: Not on file    Other Topics Concern  . Not on file   Social History Narrative  . No narrative on file    Past Surgical History:  Procedure Laterality Date  . TOTAL ABDOMINAL HYSTERECTOMY  1969    Family History  Problem Relation Age of Onset  . Cancer Father   . Cancer Mother   . Heart disease Sister     CABG age 79  . Cancer Brother     Review of Systems  Constitutional: Positive for fatigue. Negative for activity change and fever.  HENT: Negative.   Eyes: Negative.   Respiratory: Negative for cough, shortness of breath and wheezing.   Cardiovascular: Positive for leg swelling. Negative for chest pain and palpitations.  Gastrointestinal: Negative.  Negative for abdominal pain.  Endocrine: Negative.   Genitourinary: Negative.  Negative for dysuria.  Musculoskeletal: Positive for arthralgias and joint swelling.  Skin: Positive for color change. Negative for wound.  Neurological: Negative.     Allergies as of 11/24/2016      Reactions   Darvocet [propoxyphene N-acetaminophen] Other (See Comments)   unknown   Nitrofurantoin Monohyd Macro Other (See Comments)   unknown   Plavix [clopidogrel Bisulfate] Swelling   Face swells   Sulfa Antibiotics Other (See Comments)   "face got puffy"   Penicillins Rash  Medication List       Accurate as of 11/24/16  8:53 PM. Always use your most recent med list.          acetaminophen 500 MG tablet Commonly known as:  TYLENOL Take 1,000 mg by mouth every 6 (six) hours as needed for moderate pain.   acetaminophen-codeine 300-30 MG tablet Commonly known as:  TYLENOL #3 Take 1 tablet by mouth every 6 (six) hours as needed for moderate pain.   ANORO ELLIPTA 62.5-25 MCG/INH Aepb Generic drug:  umeclidinium-vilanterol Inhale 1 puff into the lungs daily.   apixaban 5 MG Tabs tablet Commonly known as:  ELIQUIS Take 1 tablet (5 mg total) by mouth 2 (two) times daily.   hydrochlorothiazide 25 MG tablet Commonly known as:   HYDRODIURIL Take 1 tablet (25 mg total) by mouth daily.   lisinopril 30 MG tablet Commonly known as:  PRINIVIL,ZESTRIL Take 15 mg by mouth daily.   metoprolol succinate 25 MG 24 hr tablet Commonly known as:  TOPROL-XL Take 1 tablet (25 mg total) by mouth daily.   predniSONE 10 MG tablet Commonly known as:  DELTASONE Take 1 tablet (10 mg total) by mouth daily with breakfast.   PROAIR HFA 108 (90 Base) MCG/ACT inhaler Generic drug:  albuterol   VITAMIN D (CHOLECALCIFEROL) PO Take 1 tablet by mouth daily.          Objective:    BP (!) 96/50   Pulse 74   Temp (!) 96.8 F (36 C) (Oral)   Ht 5\' 7"  (1.702 m)   Wt 224 lb 3.2 oz (101.7 kg)   BMI 35.11 kg/m   Allergies  Allergen Reactions  . Darvocet [Propoxyphene N-Acetaminophen] Other (See Comments)    unknown  . Nitrofurantoin Monohyd Macro Other (See Comments)    unknown  . Plavix [Clopidogrel Bisulfate] Swelling    Face swells  . Sulfa Antibiotics Other (See Comments)    "face got puffy"  . Penicillins Rash    Wt Readings from Last 3 Encounters:  11/24/16 224 lb 3.2 oz (101.7 kg)  11/21/16 224 lb (101.6 kg)  11/14/16 225 lb (102.1 kg)    Physical Exam  Constitutional: She is oriented to person, place, and time. She appears well-developed and well-nourished.  HENT:  Head: Normocephalic and atraumatic.  Eyes: Conjunctivae and EOM are normal. Pupils are equal, round, and reactive to light.  Cardiovascular: Normal rate, regular rhythm, normal heart sounds and intact distal pulses.   Pulmonary/Chest: Effort normal and breath sounds normal. She has no wheezes.  Abdominal: Soft. Bowel sounds are normal. She exhibits no distension. There is tenderness. There is no rebound.  Neurological: She is alert and oriented to person, place, and time. She has normal reflexes.  Skin: Skin is warm and dry. No rash noted. There is erythema.  Psychiatric: She has a normal mood and affect. Her behavior is normal. Judgment and  thought content normal.  Nursing note and vitals reviewed.   Results for orders placed or performed during the hospital encounter of 11/19/16  ECHOCARDIOGRAM COMPLETE  Result Value Ref Range   LV PW d 13.7 (A) 0.6 - 1.1 mm   FS 33 28 - 44 %   LA ID, A-P, ES 35 mm   LA diam end sys 35.00 mm   LA vol 34.8 mL   LA vol index 15.6 mL/m2   LV sys vol 14 mL   LV sys vol index 6.0 mL/m2   LV dias vol 38 (A) 46 -  106 mL   LV dias vol index 17.0 mL/m2   Simpson's disk 64.00    IVS/LV PW RATIO, ED 1.14    Stroke v 25 ml   LVOT diameter 19 mm   LVOT VTI 25.4 cm   LV e' LATERAL 7.29 cm/s   LV E/e' medial 10.78    LV E/e'average 10.78    LA diam index 1.56 cm/m2   LA vol A4C 43.5 ml   LVOT peak grad rest 5 mmHg   E decel time 320 msec   LVOT area 2.84 cm2   LVOT peak vel 108 cm/s   LVOT SV 72.00 mL   Peak grad 2 mmHg   E/e' ratio 10.78    MV pk E vel 78.6 m/s   MV pk A vel 117 m/s   MV Dec 320    TDI e' medial 6.09    TDI e' lateral 7.29    Lateral S' vel 12.40 cm/sec   TAPSE 17.50 mm      Assessment & Plan:   1. Hypotension due to drugs  2. Pain in both lower legs - acetaminophen-codeine (TYLENOL #3) 300-30 MG tablet; Take 1 tablet by mouth every 6 (six) hours as needed for moderate pain.  Dispense: 60 tablet; Refill: 0  3. Peripheral vascular disease (HCC) - hydrochlorothiazide (HYDRODIURIL) 25 MG tablet; Take 1 tablet (25 mg total) by mouth daily.  Dispense: 90 tablet; Refill: 3  4. Rheumatoid arthritis involving multiple sites with positive rheumatoid factor (HCC)   Continue all other maintenance medications as listed above.  Follow up plan: Return in about 4 weeks (around 12/22/2016), or recheck.  No orders of the defined types were placed in this encounter.   Educational handout given for gall stones  Remus Loffler PA-C [facility] [address]  [place], [place] [address] [phone]   11/24/2016, 8:53 PM

## 2016-11-24 NOTE — Patient Instructions (Signed)
Cholelithiasis Cholelithiasis is also called "gallstones." It is a kind of gallbladder disease. The gallbladder is an organ that stores a liquid (bile) that helps you digest fat. Gallstones may not cause symptoms (may be silent gallstones) until they cause a blockage, and then they can cause pain (gallbladder attack). Follow these instructions at home:  Take over-the-counter and prescription medicines only as told by your doctor.  Stay at a healthy weight.  Eat healthy foods. This includes: ? Eating fewer fatty foods, like fried foods. ? Eating fewer refined carbs (refined carbohydrates). Refined carbs are breads and grains that are highly processed, like white bread and white rice. Instead, choose whole grains like whole-wheat bread and brown rice. ? Eating more fiber. Almonds, fresh fruit, and beans are healthy sources of fiber.  Keep all follow-up visits as told by your doctor. This is important. Contact a doctor if:  You have sudden pain in the upper right side of your belly (abdomen). Pain might spread to your right shoulder or your chest. This may be a sign of a gallbladder attack.  You feel sick to your stomach (are nauseous).  You throw up (vomit).  You have been diagnosed with gallstones that have no symptoms and you get: ? Belly pain. ? Discomfort, burning, or fullness in the upper part of your belly (indigestion). Get help right away if:  You have sudden pain in the upper right side of your belly, and it lasts for more than 2 hours.  You have belly pain that lasts for more than 5 hours.  You have a fever or chills.  You keep feeling sick to your stomach or you keep throwing up.  Your skin or the whites of your eyes turn yellow (jaundice).  You have dark-colored pee (urine).  You have light-colored poop (stool). Summary  Cholelithiasis is also called "gallstones."  The gallbladder is an organ that stores a liquid (bile) that helps you digest fat.  Silent  gallstones are gallstones that do not cause symptoms.  A gallbladder attack may cause sudden pain in the upper right side of your belly. Pain might spread to your right shoulder or your chest. If this happens, contact your doctor.  If you have sudden pain in the upper right side of your belly that lasts for more than 2 hours, get help right away. This information is not intended to replace advice given to you by your health care provider. Make sure you discuss any questions you have with your health care provider. Document Released: 04/21/2008 Document Revised: 07/20/2016 Document Reviewed: 07/20/2016 Elsevier Interactive Patient Education  2017 [employer]  

## 2016-11-28 ENCOUNTER — Ambulatory Visit (HOSPITAL_COMMUNITY): Payer: Medicare Other

## 2016-12-01 ENCOUNTER — Ambulatory Visit (HOSPITAL_COMMUNITY)
Admission: RE | Admit: 2016-12-01 | Discharge: 2016-12-01 | Disposition: A | Discharge status: Home / Self Care | Payer: Medicare Other | Source: Ambulatory Visit | Attending: Cardiology | Admitting: Cardiology

## 2016-12-01 ENCOUNTER — Encounter (HOSPITAL_COMMUNITY): Payer: Self-pay | Admitting: Radiology

## 2016-12-01 DIAGNOSIS — I890 Lymphedema, not elsewhere classified: Secondary | ICD-10-CM | POA: Diagnosis not present

## 2016-12-01 DIAGNOSIS — I700 Atherosclerosis of aorta: Secondary | ICD-10-CM | POA: Diagnosis not present

## 2016-12-01 DIAGNOSIS — R600 Localized edema: Secondary | ICD-10-CM | POA: Insufficient documentation

## 2016-12-01 DIAGNOSIS — K5730 Diverticulosis of large intestine without perforation or abscess without bleeding: Secondary | ICD-10-CM | POA: Diagnosis not present

## 2016-12-01 MED ORDER — IOPAMIDOL (ISOVUE-300) INJECTION 61%
100.0000 mL | Freq: Once | INTRAVENOUS | Status: AC | PRN
  Administered 2016-12-01: 100 mL via INTRAVENOUS

## 2016-12-02 ENCOUNTER — Telehealth: Payer: Self-pay | Admitting: Physician Assistant

## 2016-12-02 NOTE — Telephone Encounter (Signed)
Pt notified we have not heard from cardiology Verbalizes understanding

## 2016-12-02 NOTE — Telephone Encounter (Signed)
At this time they have not contacted [place] about changing, will let her know ASAP.

## 2016-12-05 ENCOUNTER — Other Ambulatory Visit: Payer: Self-pay

## 2016-12-05 MED ORDER — APIXABAN 5 MG PO TABS: 5.0000 mg | ORAL_TABLET | Freq: Two times a day (BID) | ORAL | 3 refills | Status: DC

## 2016-12-05 NOTE — Telephone Encounter (Signed)
Pt's daughter has requested eliquis refill,pt still needs to meet her deductible and then she will apply for assistance,daughter has explained to mom.2 boxes samples given  exp 01/2019, lot [id number],

## 2016-12-22 ENCOUNTER — Encounter: Payer: Self-pay | Admitting: Cardiology

## 2016-12-22 NOTE — Progress Notes (Signed)
Cardiology Office Note  Date: 12/23/2016   ID: Courtney Berry, DOB 03-Oct-1933, MRN [id number]  PCP: Remus Loffler, PA-C  Primary Cardiologist: Nona Dell, MD   Chief Complaint  Patient presents with  . PAF  . Leg Swelling    History of Present Illness: Courtney Berry is an 83 y.o. female seen recently in January of this year. At that time Lasix dose was increased and she was also referred for an abdominal and pelvic CT to exclude any compressive cause for her lower extremity edema. The study was overall nonrevealing as outlined below. Since then she has been taken off Lasix and is now on HCTZ per PCP. The higher dose Lasix did not seem to make much difference, but fortunately her leg swelling is improving recently. Today we talked about lymphedema and the possibility of evaluation for mechanical compression in the wound center in [place]. She states that.that Ms. Jones had already discussed this with her, and I would agree.  Otherwise, she is not reporting any significant palpitations or chest pain. She continues on Toprol-XL and Eliquis.  Past Medical History:  Diagnosis Date  . COPD (chronic obstructive pulmonary disease) (HCC)   . Diverticulosis   . Essential hypertension, benign   . History of chest pain    Low risk Cardiolite 2010  . Mixed hyperlipidemia   . PAF (paroxysmal atrial fibrillation) (HCC)   . Rheumatoid arthritis (HCC)     Current Outpatient Prescriptions  Medication Sig Dispense Refill  . acetaminophen (TYLENOL) 500 MG tablet Take 1,000 mg by mouth every 6 (six) hours as needed for moderate pain.    Marland Kitchen acetaminophen-codeine (TYLENOL #3) 300-30 MG tablet Take 1 tablet by mouth every 6 (six) hours as needed for moderate pain. 60 tablet 0  . apixaban (ELIQUIS) 5 MG TABS tablet Take 1 tablet (5 mg total) by mouth 2 (two) times daily. 60 tablet 3  . hydrochlorothiazide (HYDRODIURIL) 25 MG tablet Take 1 tablet (25 mg total) by mouth daily. 90 tablet 3  .  lisinopril (PRINIVIL,ZESTRIL) 30 MG tablet Take 15 mg by mouth daily.    . metoprolol succinate (TOPROL-XL) 25 MG 24 hr tablet Take 1 tablet (25 mg total) by mouth daily. 30 tablet 3  . predniSONE (DELTASONE) 10 MG tablet Take 1 tablet (10 mg total) by mouth daily with breakfast. 30 tablet 2  . PROAIR HFA 108 (90 Base) MCG/ACT inhaler     . umeclidinium-vilanterol (ANORO ELLIPTA) 62.5-25 MCG/INH AEPB Inhale 1 puff into the lungs daily.    Marland Kitchen VITAMIN D, CHOLECALCIFEROL, PO Take 1 tablet by mouth daily.     No current facility-administered medications for this visit.    Allergies:  Darvocet [propoxyphene n-acetaminophen]; Nitrofurantoin monohyd macro; Plavix [clopidogrel bisulfate]; Sulfa antibiotics; and Penicillins   Social History: The patient  reports that she has never smoked. She has never used smokeless tobacco. She reports that she does not drink alcohol or use drugs.   ROS:  Please see the history of present illness. Otherwise, complete review of systems is positive for none.  All other systems are reviewed and negative.   Physical Exam: VS:  BP 140/72   Pulse 87   Ht 5\' 7"  (1.702 m)   Wt 224 lb (101.6 kg)   SpO2 92%   BMI 35.08 kg/m , BMI Body mass index is 35.08 kg/m.  Wt Readings from Last 3 Encounters:  12/23/16 224 lb (101.6 kg)  11/24/16 224 lb 3.2 oz (101.7 kg)  11/21/16 224 lb (101.6 kg)    General: Patient appears comfortable at rest. HEENT: Conjunctiva and lids normal, oropharynx clear with moist mucosa. Neck: Supple, no elevated JVP or carotid bruits, no thyromegaly. Lungs: Clear to auscultation, nonlabored breathing at rest. Cardiac: Regular rate and rhythm, no S3 or significant systolic murmur, no pericardial rub. Abdomen: Soft, nontender, no hepatomegaly, bowel sounds present, no guarding or rebound. Extremities: Generally improving lymphedema with less tightness, distal pulses 2+.  ECG: I personally reviewed the tracing from 10/29/2016 which showed sinus  rhythm with nonspecific T-wave changes.  Recent Labwork: 11/14/2016: ALT 10; AST 17; B Natriuretic Peptide 28.0; BUN 15; Creatinine, Ser 1.07; Hemoglobin 13.5; Platelets 210; Potassium 3.9; Sodium 138; TSH 2.025   Other Studies Reviewed Today:  Echocardiogram 11/19/2016: Study Conclusions  - Left ventricle: The cavity size was normal. Wall thickness was increased in a pattern of mild LVH. Systolic function was normal. The estimated ejection fraction was in the range of 60% to 65%. Doppler parameters are consistent with abnormal left ventricular relaxation (grade 1 diastolic dysfunction).  Abdominal and pelvic CT 12/01/2016: IMPRESSION: 1. No acute findings within the abdomen or pelvis. No explanation for patient's lower extremity edema. 2. Aortic atherosclerosis.  Assessment and Plan:  1. Lymphedema, generally improving. She is no longer on Lasix, now on HCTZ. Abdominal and pelvic CT was unrevealing for compressive etiology. Echocardiogram and vascular studies have also been performed recently. I would agree with [name] PA-C that mechanical compression might be a good option for her and have asked her to go ahead at least for assessment in the wound clinic in [place].  2. Paroxysmal atrial fibrillation, maintaining sinus rhythm. We will continue with Toprol-XL and Eliquis.  Current medicines were reviewed with the patient today.  Disposition: Follow-up in 6 months.  Signed, Jonelle Sidle, MD, [facility] 12/23/2016 11:00 AM    [facility] at [facility] [address], [place], [place] [address] Phone: [phone]; Fax: [phone]

## 2016-12-23 ENCOUNTER — Encounter: Payer: Self-pay | Admitting: Cardiology

## 2016-12-23 ENCOUNTER — Ambulatory Visit (INDEPENDENT_AMBULATORY_CARE_PROVIDER_SITE_OTHER): Payer: Medicare Other | Admitting: Cardiology

## 2016-12-23 VITALS — BP 140/72 | HR 87 | Ht 67.0 in | Wt 224.0 lb

## 2016-12-23 DIAGNOSIS — I890 Lymphedema, not elsewhere classified: Secondary | ICD-10-CM

## 2016-12-23 DIAGNOSIS — I480 Paroxysmal atrial fibrillation: Secondary | ICD-10-CM | POA: Diagnosis not present

## 2016-12-23 NOTE — Patient Instructions (Signed)
Your physician wants you to follow-up in: 6 months with Dr.McDowell You will receive a reminder letter in the mail two months in advance. If you don't receive a letter, please call our office to schedule the follow-up appointment.     Your physician recommends that you continue on your current medications as directed. Please refer to the Current Medication list given to you today.     Thank you for choosing [facility] !        

## 2017-01-15 DIAGNOSIS — I890 Lymphedema, not elsewhere classified: Secondary | ICD-10-CM | POA: Diagnosis not present

## 2017-01-19 DIAGNOSIS — I890 Lymphedema, not elsewhere classified: Secondary | ICD-10-CM | POA: Diagnosis not present

## 2017-01-22 DIAGNOSIS — I890 Lymphedema, not elsewhere classified: Secondary | ICD-10-CM | POA: Diagnosis not present

## 2017-01-29 DIAGNOSIS — I890 Lymphedema, not elsewhere classified: Secondary | ICD-10-CM | POA: Diagnosis not present

## 2017-02-02 DIAGNOSIS — I4891 Unspecified atrial fibrillation: Secondary | ICD-10-CM | POA: Diagnosis not present

## 2017-02-02 DIAGNOSIS — I10 Essential (primary) hypertension: Secondary | ICD-10-CM | POA: Diagnosis not present

## 2017-02-02 DIAGNOSIS — M069 Rheumatoid arthritis, unspecified: Secondary | ICD-10-CM | POA: Diagnosis not present

## 2017-02-02 DIAGNOSIS — J449 Chronic obstructive pulmonary disease, unspecified: Secondary | ICD-10-CM | POA: Diagnosis not present

## 2017-02-03 DIAGNOSIS — I890 Lymphedema, not elsewhere classified: Secondary | ICD-10-CM | POA: Diagnosis not present

## 2017-02-05 ENCOUNTER — Telehealth: Payer: Self-pay | Admitting: Physician Assistant

## 2017-02-05 ENCOUNTER — Other Ambulatory Visit: Payer: Self-pay | Admitting: Physician Assistant

## 2017-02-05 DIAGNOSIS — I890 Lymphedema, not elsewhere classified: Secondary | ICD-10-CM | POA: Diagnosis not present

## 2017-02-05 MED ORDER — HYDROCODONE-HOMATROPINE 5-1.5 MG/5ML PO SYRP: 5.0000 mL | ORAL_SOLUTION | Freq: Four times a day (QID) | ORAL | 0 refills | Status: DC | PRN

## 2017-02-05 MED ORDER — LEVOFLOXACIN 500 MG PO TABS: 500.0000 mg | ORAL_TABLET | Freq: Every day | ORAL | 0 refills | Status: DC

## 2017-02-05 NOTE — Telephone Encounter (Signed)
Per pt she has already been contacted by office

## 2017-02-05 NOTE — Telephone Encounter (Signed)
Patient aware.

## 2017-02-05 NOTE — Telephone Encounter (Signed)
Patient aware cough medication up front to be picked up.

## 2017-02-05 NOTE — Telephone Encounter (Signed)
Cough med will have to be picked up? Is that okay?

## 2017-02-05 NOTE — Telephone Encounter (Signed)
Patient is requesting Levaquin

## 2017-02-09 ENCOUNTER — Ambulatory Visit (INDEPENDENT_AMBULATORY_CARE_PROVIDER_SITE_OTHER): Payer: Medicare Other | Admitting: Physician Assistant

## 2017-02-09 ENCOUNTER — Encounter: Payer: Self-pay | Admitting: Physician Assistant

## 2017-02-09 VITALS — BP 138/59 | HR 82 | Temp 98.3°F | Ht 67.0 in | Wt 230.4 lb

## 2017-02-09 DIAGNOSIS — L03116 Cellulitis of left lower limb: Secondary | ICD-10-CM

## 2017-02-09 DIAGNOSIS — I890 Lymphedema, not elsewhere classified: Secondary | ICD-10-CM | POA: Insufficient documentation

## 2017-02-09 DIAGNOSIS — J40 Bronchitis, not specified as acute or chronic: Secondary | ICD-10-CM

## 2017-02-09 MED ORDER — CEPHALEXIN 250 MG PO CAPS: 250.0000 mg | ORAL_CAPSULE | Freq: Three times a day (TID) | ORAL | 0 refills | Status: DC

## 2017-02-09 MED ORDER — BENZONATATE 200 MG PO CAPS: 200.0000 mg | ORAL_CAPSULE | Freq: Three times a day (TID) | ORAL | 0 refills | Status: DC | PRN

## 2017-02-09 NOTE — Progress Notes (Signed)
BP (!) 138/59   Pulse 82   Temp 98.3 F (36.8 C) (Oral)   Ht 5\' 7"  (1.702 m)   Wt 230 lb 6.4 oz (104.5 kg)   BMI 36.09 kg/m    Subjective:    Patient ID: , female    DOB: [birth year], 83 y.o.   MRN: [id number]  HPI: Courtney Berry is a 83 y.o. female presenting on 02/09/2017 for Cellulitis  Patient was seen by physical therapy today and found to have a cellulitis of her lower leg. This is just been going on for a couple of days now. She normally has 2-3 sessions per week for her lymphedema treatment. She denies any fever or chills. She is also still dealing with a bronchitis that is not completely resolved. She still having significant cough at night. She is having occasional wheezing. The patient does have some prednisone at home. Number and have her take 10 mg a day for 1 week.  Relevant past medical, surgical, family and social history reviewed and updated as indicated. Allergies and medications reviewed and updated.  Past Medical History:  Diagnosis Date  . COPD (chronic obstructive pulmonary disease) (HCC)   . Diverticulosis   . Essential hypertension, benign   . History of chest pain    Low risk Cardiolite 2010  . Mixed hyperlipidemia   . PAF (paroxysmal atrial fibrillation) (HCC)   . Rheumatoid arthritis [facility])     Past Surgical History:  Procedure Laterality Date  . TOTAL ABDOMINAL HYSTERECTOMY  1969    Review of Systems  Constitutional: Positive for chills and fatigue. Negative for activity change and appetite change.  HENT: Positive for congestion, postnasal drip and sore throat.   Eyes: Negative.   Respiratory: Positive for cough and wheezing.   Cardiovascular: Negative.  Negative for chest pain, palpitations and leg swelling.  Gastrointestinal: Negative.   Genitourinary: Negative.   Musculoskeletal: Negative.   Skin: Positive for color change and rash.  Neurological: Positive for headaches.    Allergies as of 02/09/2017      Reactions   Darvocet [propoxyphene N-acetaminophen] Other (See Comments)   unknown   Nitrofurantoin Monohyd Macro Other (See Comments)   unknown   Plavix [clopidogrel Bisulfate] Swelling   Face swells   Sulfa Antibiotics Other (See Comments)   "face got puffy"   Penicillins Rash      Medication List       Accurate as of 02/09/17  5:03 PM. Always use your most recent med list.          acetaminophen 500 MG tablet Commonly known as:  TYLENOL Take 1,000 mg by mouth every 6 (six) hours as needed for moderate pain.   acetaminophen-codeine 300-30 MG tablet Commonly known as:  TYLENOL #3 Take 1 tablet by mouth every 6 (six) hours as needed for moderate pain.   ANORO ELLIPTA 62.5-25 MCG/INH Aepb Generic drug:  umeclidinium-vilanterol Inhale 1 puff into the lungs daily.   apixaban 5 MG Tabs tablet Commonly known as:  ELIQUIS Take 1 tablet (5 mg total) by mouth 2 (two) times daily.   benzonatate 200 MG capsule Commonly known as:  TESSALON Take 1 capsule (200 mg total) by mouth 3 (three) times daily as needed for cough.   cephALEXin 250 MG capsule Commonly known as:  KEFLEX Take 1 capsule (250 mg total) by mouth 3 (three) times daily.   hydrochlorothiazide 25 MG tablet Commonly known as:  HYDRODIURIL Take 1 tablet (25 mg total) by mouth  daily.   HYDROcodone-homatropine 5-1.5 MG/5ML syrup Commonly known as:  HYCODAN Take 5 mLs by mouth every 6 (six) hours as needed for cough.   levofloxacin 500 MG tablet Commonly known as:  LEVAQUIN   lisinopril 30 MG tablet Commonly known as:  PRINIVIL,ZESTRIL Take 15 mg by mouth daily.   lisinopril 20 MG tablet Commonly known as:  PRINIVIL,ZESTRIL TAKE ONE (1) TABLET EACH DAY   metoprolol succinate 25 MG 24 hr tablet Commonly known as:  TOPROL-XL TAKE ONE (1) TABLET EACH DAY   predniSONE 10 MG tablet Commonly known as:  DELTASONE Take 1 tablet (10 mg total) by mouth daily with breakfast.   PROAIR HFA 108 (90 Base) MCG/ACT  inhaler Generic drug:  albuterol   VITAMIN D (CHOLECALCIFEROL) PO Take 1 tablet by mouth daily.          Objective:    BP (!) 138/59   Pulse 82   Temp 98.3 F (36.8 C) (Oral)   Ht 5\' 7"  (1.702 m)   Wt 230 lb 6.4 oz (104.5 kg)   BMI 36.09 kg/m   Allergies  Allergen Reactions  . Darvocet [Propoxyphene N-Acetaminophen] Other (See Comments)    unknown  . Nitrofurantoin Monohyd Macro Other (See Comments)    unknown  . Plavix [Clopidogrel Bisulfate] Swelling    Face swells  . Sulfa Antibiotics Other (See Comments)    "face got puffy"  . Penicillins Rash    Physical Exam  Constitutional: She is oriented to person, place, and time. She appears well-developed and well-nourished.  HENT:  Head: Normocephalic and atraumatic.  Right Ear: A middle ear effusion is present.  Left Ear: A middle ear effusion is present.  Nose: Mucosal edema present. Right sinus exhibits no frontal sinus tenderness. Left sinus exhibits no frontal sinus tenderness.  Mouth/Throat: Posterior oropharyngeal erythema present. No oropharyngeal exudate or tonsillar abscesses.  Eyes: Conjunctivae and EOM are normal. Pupils are equal, round, and reactive to light.  Neck: Normal range of motion.  Cardiovascular: Normal rate, regular rhythm, normal heart sounds and intact distal pulses.   Pulmonary/Chest: Effort normal and breath sounds normal.  Abdominal: Soft. Bowel sounds are normal.  Neurological: She is alert and oriented to person, place, and time. She has normal reflexes.  Skin: Skin is warm and dry. Lesion noted. No rash noted. Rash is not papular, not pustular, not vesicular and not urticarial. There is erythema.     Psychiatric: She has a normal mood and affect. Her behavior is normal. Judgment and thought content normal.  Nursing note and vitals reviewed.       Assessment & Plan:   1. Bronchitis - levofloxacin (LEVAQUIN) 500 MG tablet;  - benzonatate (TESSALON) 200 MG capsule; Take 1 capsule  (200 mg total) by mouth 3 (three) times daily as needed for cough.  Dispense: 30 capsule; Refill: 0  2. Cellulitis of left lower extremity - cephALEXin (KEFLEX) 250 MG capsule; Take 1 capsule (250 mg total) by mouth 3 (three) times daily.  Dispense: 30 capsule; Refill: 0  3. Lymphedema of both lower extremities Resume lymphedema physical therapy 1 week.   Current Outpatient Prescriptions:  .  acetaminophen (TYLENOL) 500 MG tablet, Take 1,000 mg by mouth every 6 (six) hours as needed for moderate pain., Disp: , Rfl:  .  acetaminophen-codeine (TYLENOL #3) 300-30 MG tablet, Take 1 tablet by mouth every 6 (six) hours as needed for moderate pain., Disp: 60 tablet, Rfl: 0 .  apixaban (ELIQUIS) 5 MG TABS tablet, Take  1 tablet (5 mg total) by mouth 2 (two) times daily., Disp: 60 tablet, Rfl: 3 .  hydrochlorothiazide (HYDRODIURIL) 25 MG tablet, Take 1 tablet (25 mg total) by mouth daily., Disp: 90 tablet, Rfl: 3 .  HYDROcodone-homatropine (HYCODAN) 5-1.5 MG/5ML syrup, Take 5 mLs by mouth every 6 (six) hours as needed for cough., Disp: 120 mL, Rfl: 0 .  levofloxacin (LEVAQUIN) 500 MG tablet, , Disp: , Rfl:  .  lisinopril (PRINIVIL,ZESTRIL) 20 MG tablet, TAKE ONE (1) TABLET EACH DAY, Disp: 90 tablet, Rfl: 0 .  lisinopril (PRINIVIL,ZESTRIL) 30 MG tablet, Take 15 mg by mouth daily., Disp: , Rfl:  .  metoprolol succinate (TOPROL-XL) 25 MG 24 hr tablet, TAKE ONE (1) TABLET EACH DAY, Disp: 90 tablet, Rfl: 0 .  predniSONE (DELTASONE) 10 MG tablet, Take 1 tablet (10 mg total) by mouth daily with breakfast., Disp: 30 tablet, Rfl: 2 .  PROAIR HFA 108 (90 Base) MCG/ACT inhaler, , Disp: , Rfl:  .  umeclidinium-vilanterol (ANORO ELLIPTA) 62.5-25 MCG/INH AEPB, Inhale 1 puff into the lungs daily., Disp: , Rfl:  .  benzonatate (TESSALON) 200 MG capsule, Take 1 capsule (200 mg total) by mouth 3 (three) times daily as needed for cough., Disp: 30 capsule, Rfl: 0 .  cephALEXin (KEFLEX) 250 MG capsule, Take 1 capsule (250  mg total) by mouth 3 (three) times daily., Disp: 30 capsule, Rfl: 0 .  VITAMIN D, CHOLECALCIFEROL, PO, Take 1 tablet by mouth daily., Disp: , Rfl:   Continue all other maintenance medications as listed above.  Follow up plan: Return if symptoms worsen or fail to improve.  Educational handout given for cellulitis  Remus Loffler PA-C [facility] [address]  [place], [place] [address] [phone]   02/09/2017, 5:04 PM

## 2017-02-09 NOTE — Patient Instructions (Signed)

## 2017-02-16 ENCOUNTER — Telehealth: Payer: Self-pay | Admitting: Physician Assistant

## 2017-02-16 DIAGNOSIS — Z79899 Other long term (current) drug therapy: Secondary | ICD-10-CM | POA: Diagnosis not present

## 2017-02-16 DIAGNOSIS — I10 Essential (primary) hypertension: Secondary | ICD-10-CM | POA: Diagnosis not present

## 2017-02-16 DIAGNOSIS — R600 Localized edema: Secondary | ICD-10-CM | POA: Diagnosis not present

## 2017-02-16 DIAGNOSIS — G629 Polyneuropathy, unspecified: Secondary | ICD-10-CM | POA: Diagnosis not present

## 2017-02-16 DIAGNOSIS — E7800 Pure hypercholesterolemia, unspecified: Secondary | ICD-10-CM | POA: Diagnosis not present

## 2017-02-16 DIAGNOSIS — Z7901 Long term (current) use of anticoagulants: Secondary | ICD-10-CM | POA: Diagnosis not present

## 2017-02-16 NOTE — Telephone Encounter (Signed)
What type of referral do you need? Foot doctor and wants to see someone at [facility] Have you been seen at our office for this problem? YES (If no, schedule them an appointment.  They will need to be seen before a referral can be done.)  Is there a particular doctor or location that you prefer? [facility]  Patient notified that referrals can take up to a week or longer to process. If they haven't heard anything within a week they should call back and speak with the referral department.

## 2017-02-16 NOTE — Telephone Encounter (Signed)
Patient's daughter called to cancel message

## 2017-02-19 ENCOUNTER — Telehealth: Payer: Self-pay | Admitting: Physician Assistant

## 2017-02-19 DIAGNOSIS — I890 Lymphedema, not elsewhere classified: Secondary | ICD-10-CM | POA: Diagnosis not present

## 2017-02-20 NOTE — Telephone Encounter (Signed)
On AYetta Barre desk, waiting for signature

## 2017-02-23 ENCOUNTER — Encounter: Payer: Self-pay | Admitting: Physician Assistant

## 2017-02-23 ENCOUNTER — Ambulatory Visit (INDEPENDENT_AMBULATORY_CARE_PROVIDER_SITE_OTHER): Payer: Medicare Other | Admitting: Physician Assistant

## 2017-02-23 VITALS — BP 132/67 | HR 67 | Temp 97.0°F | Ht 67.0 in | Wt 224.6 lb

## 2017-02-23 DIAGNOSIS — I890 Lymphedema, not elsewhere classified: Secondary | ICD-10-CM | POA: Diagnosis not present

## 2017-02-23 DIAGNOSIS — G629 Polyneuropathy, unspecified: Secondary | ICD-10-CM

## 2017-02-23 DIAGNOSIS — L03116 Cellulitis of left lower limb: Secondary | ICD-10-CM | POA: Diagnosis not present

## 2017-02-23 MED ORDER — CEPHALEXIN 250 MG PO CAPS: 250.0000 mg | ORAL_CAPSULE | Freq: Three times a day (TID) | ORAL | 2 refills | Status: DC

## 2017-02-23 MED ORDER — DOXEPIN HCL 3 MG PO TABS: 3.0000 mg | ORAL_TABLET | Freq: Every day | ORAL | 6 refills | Status: DC

## 2017-02-23 NOTE — Progress Notes (Signed)
BP 132/67   Pulse 67   Temp 97 F (36.1 C) (Oral)   Ht 5\' 7"  (1.702 m)   Wt 224 lb 9.6 oz (101.9 kg)   BMI 35.18 kg/m    Subjective:    Patient ID: , female    DOB: 1933/07/27, 83 y.o.   MRN: [id number]  HPI: Courtney Berry is a 83 y.o. female presenting on 02/23/2017 for ER follow up  One week ago had recurrence of cellulitis in the right lower leg. She was being seen by the occupational therapist who does her lymphedema pumping. She was sent to the emergency room. They were their blood work was obtained and a blood culture. The blood culture did come back negative. All of her chart is reviewed.  Relevant past medical, surgical, family and social history reviewed and updated as indicated. Allergies and medications reviewed and updated.  Past Medical History:  Diagnosis Date  . COPD (chronic obstructive pulmonary disease) (HCC)   . Diverticulosis   . Essential hypertension, benign   . History of chest pain    Low risk Cardiolite 2010  . Mixed hyperlipidemia   . PAF (paroxysmal atrial fibrillation) (HCC)   . Rheumatoid arthritis [facility])     Past Surgical History:  Procedure Laterality Date  . TOTAL ABDOMINAL HYSTERECTOMY  1969    Review of Systems  Constitutional: Positive for fatigue. Negative for activity change and fever.  HENT: Negative.   Eyes: Negative.   Respiratory: Negative.  Negative for cough.   Cardiovascular: Negative.  Negative for chest pain.  Gastrointestinal: Negative.  Negative for abdominal pain.  Endocrine: Negative.   Genitourinary: Negative.  Negative for dysuria.  Musculoskeletal: Negative.   Skin: Positive for color change and rash.  Neurological: Negative.     Allergies as of 02/23/2017      Reactions   Darvocet [propoxyphene N-acetaminophen] Other (See Comments)   unknown   Nitrofurantoin Monohyd Macro Other (See Comments)   unknown   Plavix [clopidogrel Bisulfate] Swelling   Face swells   Sulfa Antibiotics Other (See  Comments)   "face got puffy"   Penicillins Rash      Medication List       Accurate as of 02/23/17  5:04 PM. Always use your most recent med list.          acetaminophen 500 MG tablet Commonly known as:  TYLENOL Take 1,000 mg by mouth every 6 (six) hours as needed for moderate pain.   acetaminophen-codeine 300-30 MG tablet Commonly known as:  TYLENOL #3 Take 1 tablet by mouth every 6 (six) hours as needed for moderate pain.   ANORO ELLIPTA 62.5-25 MCG/INH Aepb Generic drug:  umeclidinium-vilanterol Inhale 1 puff into the lungs daily.   apixaban 5 MG Tabs tablet Commonly known as:  ELIQUIS Take 1 tablet (5 mg total) by mouth 2 (two) times daily.   cephALEXin 250 MG capsule Commonly known as:  KEFLEX Take 1 capsule (250 mg total) by mouth 3 (three) times daily.   Doxepin HCl 3 MG Tabs Take 1 tablet (3 mg total) by mouth at bedtime.   hydrochlorothiazide 25 MG tablet Commonly known as:  HYDRODIURIL Take 1 tablet (25 mg total) by mouth daily.   lisinopril 20 MG tablet Commonly known as:  PRINIVIL,ZESTRIL TAKE ONE (1) TABLET EACH DAY   metoprolol succinate 25 MG 24 hr tablet Commonly known as:  TOPROL-XL TAKE ONE (1) TABLET EACH DAY   predniSONE 10 MG tablet Commonly known as:  DELTASONE Take 1 tablet (10 mg total) by mouth daily with breakfast.   PROAIR HFA 108 (90 Base) MCG/ACT inhaler Generic drug:  albuterol   VITAMIN D (CHOLECALCIFEROL) PO Take 1 tablet by mouth daily.          Objective:    BP 132/67   Pulse 67   Temp 97 F (36.1 C) (Oral)   Ht 5\' 7"  (1.702 m)   Wt 224 lb 9.6 oz (101.9 kg)   BMI 35.18 kg/m   Allergies  Allergen Reactions  . Darvocet [Propoxyphene N-Acetaminophen] Other (See Comments)    unknown  . Nitrofurantoin Monohyd Macro Other (See Comments)    unknown  . Plavix [Clopidogrel Bisulfate] Swelling    Face swells  . Sulfa Antibiotics Other (See Comments)    "face got puffy"  . Penicillins Rash    Physical Exam    Constitutional: She is oriented to person, place, and time. She appears well-developed and well-nourished.  HENT:  Head: Normocephalic and atraumatic.  Eyes: Conjunctivae and EOM are normal. Pupils are equal, round, and reactive to light.  Cardiovascular: Normal rate, regular rhythm, normal heart sounds and intact distal pulses.   Pulmonary/Chest: Effort normal and breath sounds normal.  Abdominal: Soft. Bowel sounds are normal.  Neurological: She is alert and oriented to person, place, and time. She has normal reflexes.  Skin: Skin is warm and dry. Rash noted. Rash is macular. There is erythema.     2+ edema in lower legs  Psychiatric: She has a normal mood and affect. Her behavior is normal. Judgment and thought content normal.  Nursing note and vitals reviewed.       Assessment & Plan:   1. Cellulitis of left lower extremity - cephALEXin (KEFLEX) 250 MG capsule; Take 1 capsule (250 mg total) by mouth 3 (three) times daily.  Dispense: 30 capsule; Refill: 2  2. Neuropathy (HCC) - Doxepin HCl 3 MG TABS; Take 1 tablet (3 mg total) by mouth at bedtime.  Dispense: 30 tablet; Refill: 6   Continue all other maintenance medications as listed above.  Follow up plan: Return if symptoms worsen or fail to improve.  Educational handout given for neuropathy  PA-C [facility] [address]  [place], [place] [phone]   02/23/2017, 5:04 PM

## 2017-02-23 NOTE — Patient Instructions (Signed)
Focal Neuropathy Focal neuropathy is a nerve injury that affects one area of the body, such as an arm, a leg, or the face. The injury may involve one nerve or a small group of nerves. Examples of focal neuropathy include brachial plexus injury and Parson[place]-Turner syndrome. What are the causes? This condition may be caused by:  A sudden cut or stretch. This can happen because of an accident or during surgery.  A small injury that happens again and again.  A compression injury. This kind of injury happens when a blood vessel, a tumor, or something else presses on a nerve for a long time.  Entrapment. This can happen to a nerve that has to pass through a narrow area.  Lack of blood to the nerves. This can be caused by certain medical conditions, like diabetes or vasculitis.  Extreme cold.  Severe burns.  Radiation. What are the signs or symptoms? Symptoms of this condition depend on where the damaged nerve is and what kind of nerve it is. For example, damage to nerves that carry signals away from the brain can cause symptoms related to movement, but damage to nerves that carry signals to the brain can cause symptoms related to feeling and pain. Symptoms affect only one area of the body. Common symptoms include:  Numbness.  Tingling.  A burning pain.  A prickling feeling.  Very sensitive skin.  Weakness.  Paralysis.  Muscle twitching.  Muscles getting smaller (muscle wasting).  Poor coordination. How is this diagnosed? This condition may be diagnosed with:  A neurologic exam. During the exam, your health care provider will check your reflexes, how you move, and what you can feel.  Tests. Tests may be done to help find the area that has been damaged. Tests may include:  Imaging tests, such as a CT scan or MRI.  Electromyogram (EMG). This test checks the nerves that control your muscles.  Nerve conduction velocity (NCV) tests. These tests check how quickly messages  pass through your nerves. How is this treated? Treatment for this condition depends on what damaged the nerve and how much of the nerve is damaged. Treatment may involve:  Surgery to ease pressure on a nerve. This may be done if you have a compression injury or entrapment.  Medicines, such as:  Medicines for pain, such painkillers, certain anti-seizure medicines, or antidepressants.  Steroid medicines. These may be given in pill form or with a shot (injection).  Physical therapy (PT) to help movement.  Splints or other devices to help movement. Follow these instructions at home:   Take over-the-counter and prescription medicines only as told by your health care provider.  If you were given a splint or other device to help with movement, use it as told by your health care provider.  If any part of your body is numb, check it every day for signs of injury or infection. Watch for:  Redness, swelling, or pain.  Fluid or blood.  Warmth.  Pus or a bad smell.  Do not do things that put pressure on your damaged nerve. You may have to avoid certain activities or avoid sitting or lying in a certain way.  Do not smoke. Smoking keeps blood from getting to damaged nerves. If you need help quitting, ask your health care provider.  Limit alcohol intake, or avoid alcohol completely. Too much alcohol can cause a lack of B vitamins, which are needed for healthy nerves.  Have a good support system. Coping with focal neuropathy can   be stressful. Talk with a mental health caregiver or join a support group if you are struggling.  Keep all follow-up visits as told by your health care provider. This is important. These include PT visits. Contact a health care provider if:  You think you have an injury or infection.  You have new symptoms.  You are struggling emotionally from dealing with your condition. Get help right away if:  You have severe pain that comes on suddenly.  You develop any  paralysis.  You lose feeling in any part of your body. This information is not intended to replace advice given to you by your health care provider. Make sure you discuss any questions you have with your health care provider. Document Released: 10/15/2015 Document Revised: 04/10/2016 Document Reviewed: 06/01/2015 Elsevier Interactive Patient Education  2017 [employer]  

## 2017-02-26 DIAGNOSIS — I890 Lymphedema, not elsewhere classified: Secondary | ICD-10-CM | POA: Diagnosis not present

## 2017-03-02 DIAGNOSIS — I890 Lymphedema, not elsewhere classified: Secondary | ICD-10-CM | POA: Diagnosis not present

## 2017-03-05 DIAGNOSIS — I890 Lymphedema, not elsewhere classified: Secondary | ICD-10-CM | POA: Diagnosis not present

## 2017-03-19 ENCOUNTER — Observation Stay (HOSPITAL_COMMUNITY)
Admission: EM | Admit: 2017-03-19 | Discharge: 2017-03-20 | Disposition: A | Discharge status: Home / Self Care | Payer: Medicare Other | Attending: Family Medicine | Admitting: Family Medicine

## 2017-03-19 ENCOUNTER — Encounter (HOSPITAL_COMMUNITY): Payer: Self-pay | Admitting: Emergency Medicine

## 2017-03-19 ENCOUNTER — Emergency Department (HOSPITAL_COMMUNITY): Payer: Medicare Other

## 2017-03-19 DIAGNOSIS — Z880 Allergy status to penicillin: Secondary | ICD-10-CM | POA: Diagnosis not present

## 2017-03-19 DIAGNOSIS — Z79899 Other long term (current) drug therapy: Secondary | ICD-10-CM | POA: Insufficient documentation

## 2017-03-19 DIAGNOSIS — Z809 Family history of malignant neoplasm, unspecified: Secondary | ICD-10-CM | POA: Diagnosis not present

## 2017-03-19 DIAGNOSIS — I890 Lymphedema, not elsewhere classified: Secondary | ICD-10-CM | POA: Insufficient documentation

## 2017-03-19 DIAGNOSIS — I739 Peripheral vascular disease, unspecified: Secondary | ICD-10-CM | POA: Diagnosis not present

## 2017-03-19 DIAGNOSIS — Z882 Allergy status to sulfonamides status: Secondary | ICD-10-CM | POA: Insufficient documentation

## 2017-03-19 DIAGNOSIS — I4891 Unspecified atrial fibrillation: Secondary | ICD-10-CM | POA: Diagnosis present

## 2017-03-19 DIAGNOSIS — I952 Hypotension due to drugs: Secondary | ICD-10-CM | POA: Diagnosis not present

## 2017-03-19 DIAGNOSIS — M0579 Rheumatoid arthritis with rheumatoid factor of multiple sites without organ or systems involvement: Secondary | ICD-10-CM | POA: Insufficient documentation

## 2017-03-19 DIAGNOSIS — K5790 Diverticulosis of intestine, part unspecified, without perforation or abscess without bleeding: Secondary | ICD-10-CM | POA: Diagnosis not present

## 2017-03-19 DIAGNOSIS — I10 Essential (primary) hypertension: Secondary | ICD-10-CM | POA: Diagnosis not present

## 2017-03-19 DIAGNOSIS — J449 Chronic obstructive pulmonary disease, unspecified: Secondary | ICD-10-CM | POA: Insufficient documentation

## 2017-03-19 DIAGNOSIS — Z90710 Acquired absence of both cervix and uterus: Secondary | ICD-10-CM | POA: Insufficient documentation

## 2017-03-19 DIAGNOSIS — E782 Mixed hyperlipidemia: Secondary | ICD-10-CM | POA: Diagnosis not present

## 2017-03-19 DIAGNOSIS — Z8249 Family history of ischemic heart disease and other diseases of the circulatory system: Secondary | ICD-10-CM | POA: Insufficient documentation

## 2017-03-19 DIAGNOSIS — I480 Paroxysmal atrial fibrillation: Secondary | ICD-10-CM | POA: Diagnosis not present

## 2017-03-19 DIAGNOSIS — Z888 Allergy status to other drugs, medicaments and biological substances status: Secondary | ICD-10-CM | POA: Insufficient documentation

## 2017-03-19 DIAGNOSIS — R000 Tachycardia, unspecified: Secondary | ICD-10-CM | POA: Diagnosis not present

## 2017-03-19 DIAGNOSIS — Z7901 Long term (current) use of anticoagulants: Secondary | ICD-10-CM | POA: Insufficient documentation

## 2017-03-19 DIAGNOSIS — R0602 Shortness of breath: Secondary | ICD-10-CM | POA: Diagnosis not present

## 2017-03-19 LAB — CBC WITH DIFFERENTIAL/PLATELET
BASOS PCT: 0 %
Basophils Absolute: 0 10*3/uL (ref 0.0–0.1)
EOS PCT: 1 %
Eosinophils Absolute: 0.1 10*3/uL (ref 0.0–0.7)
HEMATOCRIT: 42.5 % (ref 36.0–46.0)
Hemoglobin: 13.3 g/dL (ref 12.0–15.0)
LYMPHS PCT: 24 %
Lymphs Abs: 3.1 10*3/uL (ref 0.7–4.0)
MCH: 29.9 pg (ref 26.0–34.0)
MCHC: 31.3 g/dL (ref 30.0–36.0)
MCV: 95.5 fL (ref 78.0–100.0)
MONO ABS: 1.2 10*3/uL — AB (ref 0.1–1.0)
MONOS PCT: 9 %
NEUTROS ABS: 8.4 10*3/uL — AB (ref 1.7–7.7)
Neutrophils Relative %: 66 %
PLATELETS: 218 10*3/uL (ref 150–400)
RBC: 4.45 MIL/uL (ref 3.87–5.11)
RDW: 12.6 % (ref 11.5–15.5)
WBC: 12.8 10*3/uL — ABNORMAL HIGH (ref 4.0–10.5)

## 2017-03-19 LAB — HEPATIC FUNCTION PANEL
ALBUMIN: 3 g/dL — AB (ref 3.5–5.0)
ALK PHOS: 79 U/L (ref 38–126)
ALT: 21 U/L (ref 14–54)
AST: 22 U/L (ref 15–41)
BILIRUBIN DIRECT: 0.1 mg/dL (ref 0.1–0.5)
BILIRUBIN TOTAL: 0.3 mg/dL (ref 0.3–1.2)
Indirect Bilirubin: 0.2 mg/dL — ABNORMAL LOW (ref 0.3–0.9)
Total Protein: 6.5 g/dL (ref 6.5–8.1)

## 2017-03-19 LAB — BASIC METABOLIC PANEL
ANION GAP: 9 (ref 5–15)
BUN: 22 mg/dL — AB (ref 6–20)
CALCIUM: 9.2 mg/dL (ref 8.9–10.3)
CO2: 26 mmol/L (ref 22–32)
CREATININE: 1.07 mg/dL — AB (ref 0.44–1.00)
Chloride: 104 mmol/L (ref 101–111)
GFR calc Af Amer: 54 mL/min — ABNORMAL LOW (ref >60)
GFR calc non Af Amer: 47 mL/min — ABNORMAL LOW (ref >60)
GLUCOSE: 124 mg/dL — AB (ref 65–99)
Potassium: 4.3 mmol/L (ref 3.5–5.1)
Sodium: 139 mmol/L (ref 135–145)

## 2017-03-19 LAB — TROPONIN I: Troponin I: <0.03 ng/mL (ref <0.03)

## 2017-03-19 MED ORDER — DILTIAZEM HCL 100 MG IV SOLR
5.0000 mg/h | Freq: Once | INTRAVENOUS | Status: AC
  Administered 2017-03-19: 5 mg/h via INTRAVENOUS
  Filled 2017-03-19: qty 100

## 2017-03-19 MED ORDER — DILTIAZEM HCL 25 MG/5ML IV SOLN
10.0000 mg | Freq: Once | INTRAVENOUS | Status: AC
  Administered 2017-03-19: 10 mg via INTRAVENOUS

## 2017-03-19 NOTE — ED Provider Notes (Signed)
[facility]-EMER[facility]Y DEPT Provider Note   CSN: [id number] Arrival date & time: 03/19/17  2026 By signing my name below, I, Levon Hedger, attest that this documentation has been prepared under the direction and in the presence of Bethann Berkshire, MD . Electronically Signed: Levon Hedger, Scribe. 03/19/2017. 9:14 PM.   History   Chief Complaint Chief Complaint  Patient presents with  . Chest Pain   HPI Courtney Berry is a 83 y.o. female with a history of COPD, PAF, RA who presents to the Emergency Department complaining of sudden onset, constant chest pain onset a few hours ago. She describes this as pressure and tightness that radiates upwards. No alleviating or modifying factors noted.  She reports associated SOB x2 days and heart palpitations. Pt endorses the use of anticoagulants and states she has been compliant with her medications. Pt has no other acute complaints or associated symptoms at this time.    The history is provided by the patient. No language interpreter was used.  Chest Pain   This is a new problem. The current episode started 1 to 2 hours ago. The problem occurs constantly. The problem has not changed since onset.The pain is at a severity of 8/10. The pain is moderate. The quality of the pain is described as pressure-like. Associated symptoms include palpitations and shortness of breath. Pertinent negatives include no abdominal pain, no back pain, no cough and no headaches. Risk factors include being elderly and post-menopausal.  Her past medical history is significant for COPD and hypertension.  Pertinent negatives for past medical history include no seizures.    Past Medical History:  Diagnosis Date  . COPD (chronic obstructive pulmonary disease) (HCC)   . Diverticulosis   . Essential hypertension, benign   . History of chest pain    Low risk Cardiolite 2010  . Mixed hyperlipidemia   . PAF (paroxysmal atrial fibrillation) (HCC)   . Rheumatoid arthritis [facility])      Patient Active Problem List   Diagnosis Date Noted  . Lymphedema of both lower extremities 02/09/2017  . Hypotension due to drugs 11/24/2016  . Pain in both lower legs 11/24/2016  . Rheumatoid arthritis involving multiple sites with positive rheumatoid factor (HCC) 10/20/2016  . Peripheral vascular disease (HCC) 10/20/2016  . Atrial fibrillation with rapid ventricular response (HCC) 08/01/2016  . Atrial fibrillation with RVR (HCC) 08/01/2016  . Bradycardia 08/01/2016  . Atrial fibrillation, new onset (HCC) 02/14/2016  . Chronic anticoagulation 02/14/2016  . Palpitations 03/02/2012  . Dyslipidemia 09/03/2009  . Essential hypertension 09/03/2009  . Shortness of breath 09/03/2009    Past Surgical History:  Procedure Laterality Date  . TOTAL ABDOMINAL HYSTERECTOMY  1969    OB History    No data available      Home Medications    Prior to Admission medications   Medication Sig Start Date End Date Taking? Authorizing Provider  acetaminophen (TYLENOL) 500 MG tablet Take 1,000 mg by mouth every 6 (six) hours as needed for moderate pain.   Yes Historical Provider, MD  apixaban (ELIQUIS) 5 MG TABS tablet Take 1 tablet (5 mg total) by mouth 2 (two) times daily. 12/05/16  Yes Jonelle Sidle, MD  lisinopril (PRINIVIL,ZESTRIL) 20 MG tablet TAKE ONE (1) TABLET EACH DAY 02/05/17  Yes Remus Loffler, PA-C  metoprolol succinate (TOPROL-XL) 25 MG 24 hr tablet TAKE ONE (1) TABLET EACH DAY 02/05/17  Yes Remus Loffler, PA-C  predniSONE (DELTASONE) 10 MG tablet Take 10 mg by mouth daily with  breakfast.   Yes Historical Provider, MD  PROAIR HFA 108 (90 Base) MCG/ACT inhaler Inhale 1-2 puffs into the lungs every 6 (six) hours as needed for wheezing or shortness of breath.  09/23/16  Yes Historical Provider, MD  umeclidinium-vilanterol (ANORO ELLIPTA) 62.5-25 MCG/INH AEPB Inhale 1 puff into the lungs daily.   Yes Historical Provider, MD  VITAMIN D, CHOLECALCIFEROL, PO Take 1 tablet by mouth daily.    Yes Historical Provider, MD  cephALEXin (KEFLEX) 250 MG capsule Take 1 capsule (250 mg total) by mouth 3 (three) times daily. Patient not taking: Reported on 03/19/2017 02/23/17   Remus Loffler, PA-C  Doxepin HCl 3 MG TABS Take 1 tablet (3 mg total) by mouth at bedtime. Patient not taking: Reported on 03/19/2017 02/23/17   Remus Loffler, PA-C    Family History Family History  Problem Relation Age of Onset  . Cancer Father   . Cancer Mother   . Heart disease Sister     CABG age 52  . Cancer Brother     Social History Social History  Substance Use Topics  . Smoking status: Never Smoker  . Smokeless tobacco: Never Used     Comment: Tobacco use-no  . Alcohol use No     Allergies   Darvocet [propoxyphene n-acetaminophen]; Nitrofurantoin monohyd macro; Plavix [clopidogrel bisulfate]; Sulfa antibiotics; and Penicillins   Review of Systems Review of Systems  Constitutional: Negative for appetite change and fatigue.  HENT: Negative for congestion, ear discharge and sinus pressure.   Eyes: Negative for discharge.  Respiratory: Positive for shortness of breath. Negative for cough.   Cardiovascular: Positive for chest pain and palpitations.  Gastrointestinal: Negative for abdominal pain and diarrhea.  Genitourinary: Negative for frequency and hematuria.  Musculoskeletal: Negative for back pain.  Skin: Negative for rash.  Neurological: Negative for seizures and headaches.  Psychiatric/Behavioral: Negative for hallucinations.   Physical Exam Updated Vital Signs BP (!) 172/86 (BP Location: Right Arm)   Pulse (!) 58   Temp 97.8 F (36.6 C)   Resp (!) 25   Ht 5\' 4"  (1.626 m)   Wt 210 lb (95.3 kg)   SpO2 95%   BMI 36.05 kg/m   Physical Exam  Constitutional: She is oriented to person, place, and time. She appears well-developed.  HENT:  Head: Normocephalic.  Eyes: Conjunctivae and EOM are normal. No scleral icterus.  Neck: Neck supple. No thyromegaly present.  Cardiovascular:  Exam reveals no gallop and no friction rub.   No murmur heard. Rapid, irregular heartbeat  Pulmonary/Chest: No stridor. She has no wheezes. She has no rales. She exhibits no tenderness.  Abdominal: She exhibits no distension. There is no tenderness. There is no rebound.  Musculoskeletal: Normal range of motion. She exhibits no edema.  Lymphadenopathy:    She has no cervical adenopathy.  Neurological: She is oriented to person, place, and time. She exhibits normal muscle tone. Coordination normal.  Skin: No rash noted. No erythema.  Psychiatric: She has a normal mood and affect. Her behavior is normal.   ED Treatments / Results  DIAGNOSTIC STUDIES:  Oxygen Saturation is 95% on RA, normal by my interpretation.    COORDINATION OF CARE:  9:12 PM Discussed treatment plan with pt at bedside and pt agreed to plan.   Labs (all labs ordered are listed, but only abnormal results are displayed) Labs Reviewed  CBC WITH DIFFERENTIAL/PLATELET  BASIC METABOLIC PANEL  TROPONIN I    EKG  EKG Interpretation None  Radiology No results found.  Procedures Procedures (including critical care time)  Medications Ordered in ED Medications - No data to display   Initial Impression / Assessment and Plan / ED Course  I have reviewed the triage vital signs and the nursing notes.  Pertinent labs & imaging results that were available during my care of the patient were reviewed by me and considered in my medical decision making (see chart for details).    CRITICAL CARE Performed by: Monque Haggar L Total critical care time: 35 minutes Critical care time was exclusive of separately billable procedures and treating other patients. Critical care was necessary to treat or prevent imminent or life-threatening deterioration. Critical care was time spent personally by me on the following activities: development of treatment plan with patient and/or surrogate as well as nursing, discussions with  consultants, evaluation of patient's response to treatment, examination of patient, obtaining history from patient or surrogate, ordering and performing treatments and interventions, ordering and review of laboratory studies, ordering and review of radiographic studies, pulse oximetry and re-evaluation of patient's condition.   Patient with rapid atrial fib. Patient is controlled on Cardizem drip will be admitted  Final Clinical Impressions(s) / ED Diagnoses   Final diagnoses:  None    New Prescriptions New Prescriptions   No medications on file   The chart was scribed for me under my direct supervision.  I personally performed the history, physical, and medical decision making and all procedures in the evaluation of this patient.Bethann Berkshire, MD 03/19/17 2248

## 2017-03-19 NOTE — H&P (Signed)
[facility] H&P    Patient Demographics:    Courtney Berry, is a 82 y.o. female  MRN: [id number]  DOB - 11-25-1932  Admit Date - 03/19/2017  Referring MD/NP/PA: Dr Estell Harpin  Outpatient Primary MD for the patient is Remus Loffler, PA-C  Patient coming from: Home  Chief Complaint  Patient presents with  . Chest Pain      HPI:    Courtney Berry  is a 83 y.o. female, With history of COPD, paroxysmal atrial fibrillation, rheumatoid arthritis who came to ED with complaints of palpitations. Patient says that she started having these symptoms after she used inhaler for COPD. She denies shortness of breath. No chest pain. Complains of chest discomfort during that episode. No nausea vomiting or diarrhea. Patient is currently on anticoagulation with eliquis for paroxysmal atrial fibrillation She denies fever, no dysuria.  In ED patient was found to be in A. fib with RVR, she was started on Cardizem drip. Heart rate has improved since starting on Cardizem.      Review of systems:      A full 10 point Review of Systems was done, except as stated above, all other Review of Systems were negative.   With Past History of the following :    Past Medical History:  Diagnosis Date  . COPD (chronic obstructive pulmonary disease) (HCC)   . Diverticulosis   . Essential hypertension, benign   . History of chest pain    Low risk Cardiolite 2010  . Mixed hyperlipidemia   . PAF (paroxysmal atrial fibrillation) (HCC)   . Rheumatoid arthritis [facility])       Past Surgical History:  Procedure Laterality Date  . TOTAL ABDOMINAL HYSTERECTOMY  1969      Social History:      Social History  Substance Use Topics  . Smoking status: Never Smoker  . Smokeless tobacco: Never Used     Comment: Tobacco use-no  . Alcohol use No       Family History :     Family History  Problem Relation Age of Onset  . Cancer Father   .  Cancer Mother   . Heart disease Sister     CABG age 44  . Cancer Brother       Home Medications:   Prior to Admission medications   Medication Sig Start Date End Date Taking? Authorizing Provider  acetaminophen (TYLENOL) 500 MG tablet Take 1,000 mg by mouth every 6 (six) hours as needed for moderate pain.   Yes Historical Provider, MD  apixaban (ELIQUIS) 5 MG TABS tablet Take 1 tablet (5 mg total) by mouth 2 (two) times daily. 12/05/16  Yes Jonelle Sidle, MD  lisinopril (PRINIVIL,ZESTRIL) 20 MG tablet TAKE ONE (1) TABLET EACH DAY 02/05/17  Yes Remus Loffler, PA-C  metoprolol succinate (TOPROL-XL) 25 MG 24 hr tablet TAKE ONE (1) TABLET EACH DAY 02/05/17  Yes Remus Loffler, PA-C  predniSONE (DELTASONE) 10 MG tablet Take 10 mg by mouth daily with breakfast.   Yes Historical Provider, MD  [facility]  HFA 108 (90 Base) MCG/ACT inhaler Inhale 1-2 puffs into the lungs every 6 (six) hours as needed for wheezing or shortness of breath.  09/23/16  Yes Historical Provider, MD  umeclidinium-vilanterol (ANORO ELLIPTA) 62.5-25 MCG/INH AEPB Inhale 1 puff into the lungs daily.   Yes Historical Provider, MD  VITAMIN D, CHOLECALCIFEROL, PO Take 1 tablet by mouth daily.   Yes Historical Provider, MD  cephALEXin (KEFLEX) 250 MG capsule Take 1 capsule (250 mg total) by mouth 3 (three) times daily. Patient not taking: Reported on 03/19/2017 02/23/17   Remus Loffler, PA-C  Doxepin HCl 3 MG TABS Take 1 tablet (3 mg total) by mouth at bedtime. Patient not taking: Reported on 03/19/2017 02/23/17   Remus Loffler, PA-C     Allergies:     Allergies  Allergen Reactions  . Darvocet [Propoxyphene N-Acetaminophen] Other (See Comments)    unknown  . Nitrofurantoin Monohyd Macro Other (See Comments)    unknown  . Plavix [Clopidogrel Bisulfate] Swelling    Face swells  . Sulfa Antibiotics Other (See Comments)    "face got puffy"  . Penicillins Rash     Physical Exam:   Vitals  Blood pressure (!) 172/86, pulse (!) 58,  temperature 97.8 F (36.6 C), resp. rate (!) 25, height 5\' 4"  (1.626 m), weight 95.3 kg (210 lb), SpO2 95 %.  1.  General: Appears in no acute distress   2. Psychiatric:  Intact judgement and  insight, awake alert, oriented x 3.  3. Neurologic: No focal neurological deficits, all cranial nerves intact.Strength 5/5 all 4 extremities, sensation intact all 4 extremities, plantars down going.  4. Eyes :  anicteric sclerae, moist conjunctivae with no lid lag. PERRLA.  5. ENMT:  Oropharynx clear with moist mucous membranes and good dentition  6. Neck:  supple, no cervical lymphadenopathy appriciated, No thyromegaly  7. Respiratory : Normal respiratory effort, good air movement bilaterally,clear to  auscultation bilaterally  8. Cardiovascular  Irregular rhythm , no gallops, rubs or murmurs, no leg edema  9. Gastrointestinal:  Positive bowel sounds, abdomen soft, non-tender to palpation,no hepatosplenomegaly, no rigidity or guarding       10. Skin:  No cyanosis, normal texture and turgor, no rash, lesions or ulcers  11.Musculoskeletal:  Good muscle tone,  joints appear normal , no effusions,  normal range of motion    Data Review:    CBC  Recent Labs Lab 03/19/17 2049  WBC 12.8*  HGB 13.3  HCT 42.5  PLT 218  MCV 95.5  MCH 29.9  MCHC 31.3  RDW 12.6  LYMPHSABS 3.1  MONOABS 1.2*  EOSABS 0.1  BASOSABS 0.0   ------------------------------------------------------------------------------------------------------------------  Chemistries   Recent Labs Lab 03/19/17 2049  NA 139  K 4.3  CL 104  CO2 26  GLUCOSE 124*  BUN 22*  CREATININE 1.07*  CALCIUM 9.2  AST 22  ALT 21  ALKPHOS 79  BILITOT 0.3   ------------------------------------------------------------------------------------------------------------------  ------------------------------------------------------------------------------------------------------------------ GFR: Estimated Creatinine  Clearance: 45.4 mL/min (A) (by C-G formula based on SCr of 1.07 mg/dL (H)). Liver Function Tests:  Recent Labs Lab 03/19/17 2049  AST 22  ALT 21  ALKPHOS 79  BILITOT 0.3  PROT 6.5  ALBUMIN 3.0*   Cardiac Enzymes:  Recent Labs Lab 03/19/17 2049  TROPONINI <0.03    --------------------------------------------------------------------------------------------------------------- Urine analysis:    Component Value Date/Time   COLORURINE YELLOW 10/29/2016 1246   APPEARANCEUR CLEAR 10/29/2016 1246   LABSPEC 1.025 10/29/2016 1246   PHURINE 5.5 10/29/2016  1246   GLUCOSEU NEGATIVE 10/29/2016 1246   HGBUR NEGATIVE 10/29/2016 1246   BILIRUBINUR NEGATIVE 10/29/2016 1246   KETONESUR NEGATIVE 10/29/2016 1246   PROTEINUR NEGATIVE 10/29/2016 1246   NITRITE NEGATIVE 10/29/2016 1246   LEUKOCYTESUR TRACE (A) 10/29/2016 1246      Imaging Results:    Dg Chest 2 View  Result Date: 03/19/2017 CLINICAL DATA:  Shortness of breath for the past 2 days. Chest pressure 2 hours ago. EXAM: CHEST  2 VIEW COMPARISON:  10/29/2016. FINDINGS: The cardiac silhouette remains borderline enlarged. No significant change in a small amount of linear scarring at the left lung base. Progressive elevation of the right hemidiaphragm. The aorta is mildly tortuous and calcified. Thoracolumbar spine degenerative changes. IMPRESSION: 1. No acute abnormality. 2. Progressive elevation of the right hemidiaphragm. 3. Stable mild left basilar linear scarring. Electronically Signed   By: Beckie Salts M.D.   On: 03/19/2017 21:35    My personal review of EKG: Rhythm - Atrial fibrillation   Assessment & Plan:    Active Problems:   Atrial fibrillation with RVR (HCC)   1. Atrial fibrillation with RVR- we'll place under observation in stepdown unit, continue Cardizem infusion. CHA2DS2VASc score is 4. Patient is currently on anticoagulation with Eliquis. Continue Toprol-XL 25 mg by mouth daily 2. Chest pain- patient had chest  discomfort during the episode of A. fib with RVR, cardiac enzymes are negative in the ED. Will cycle troponin every 6 hours 3. 3. COPD - stable, no exacerbation. Continue Anoro Ellipta. 4. Hypertension- blood pressure stable, continue Toprol-XL 25 mg daily, lisinopril 20 mg by mouth daily.   DVT Prophylaxis-   Eliquis  AM Labs Ordered, also please review Full Orders  Family Communication: Admission, patients condition and plan of care including tests being ordered have been discussed with the patient who indicate understanding and agree with the plan and Code Status.  Code Status:  Full code  Admission status: Observation    Time spent in minutes : 60 minutes   Caylen Kuwahara S M.D on 03/19/2017 at 11:23 PM  Between 7am to 7pm - Pager - [phone]. After 7pm go to [email or web address] - password [facility]  [facility] - Office  [phone]

## 2017-03-19 NOTE — ED Triage Notes (Signed)
Pt c/o sob x 2 days. Pt started having chest pressure a couple of hrs ago and hx of a fib. Nad at this time. nonidaphoretic. A/o. Mild sob noted.

## 2017-03-20 ENCOUNTER — Encounter (HOSPITAL_COMMUNITY): Payer: Self-pay | Admitting: *Deleted

## 2017-03-20 DIAGNOSIS — I4891 Unspecified atrial fibrillation: Secondary | ICD-10-CM | POA: Diagnosis not present

## 2017-03-20 DIAGNOSIS — I480 Paroxysmal atrial fibrillation: Secondary | ICD-10-CM | POA: Diagnosis not present

## 2017-03-20 LAB — COMPREHENSIVE METABOLIC PANEL
ALBUMIN: 3 g/dL — AB (ref 3.5–5.0)
ALT: 19 U/L (ref 14–54)
ANION GAP: 7 (ref 5–15)
AST: 21 U/L (ref 15–41)
Alkaline Phosphatase: 64 U/L (ref 38–126)
BUN: 19 mg/dL (ref 6–20)
CHLORIDE: 105 mmol/L (ref 101–111)
CO2: 30 mmol/L (ref 22–32)
Calcium: 9.2 mg/dL (ref 8.9–10.3)
Creatinine, Ser: 1.03 mg/dL — ABNORMAL HIGH (ref 0.44–1.00)
GFR calc Af Amer: 57 mL/min — ABNORMAL LOW (ref >60)
GFR calc non Af Amer: 49 mL/min — ABNORMAL LOW (ref >60)
GLUCOSE: 114 mg/dL — AB (ref 65–99)
Potassium: 4 mmol/L (ref 3.5–5.1)
SODIUM: 142 mmol/L (ref 135–145)
TOTAL PROTEIN: 6.6 g/dL (ref 6.5–8.1)
Total Bilirubin: 0.6 mg/dL (ref 0.3–1.2)

## 2017-03-20 LAB — CBC
HCT: 43 % (ref 36.0–46.0)
Hemoglobin: 13.4 g/dL (ref 12.0–15.0)
MCH: 29.9 pg (ref 26.0–34.0)
MCHC: 31.2 g/dL (ref 30.0–36.0)
MCV: 96 fL (ref 78.0–100.0)
PLATELETS: 200 10*3/uL (ref 150–400)
RBC: 4.48 MIL/uL (ref 3.87–5.11)
RDW: 12.6 % (ref 11.5–15.5)
WBC: 11.9 10*3/uL — AB (ref 4.0–10.5)

## 2017-03-20 LAB — TROPONIN I
Troponin I: <0.03 ng/mL (ref <0.03)
Troponin I: <0.03 ng/mL (ref <0.03)

## 2017-03-20 MED ORDER — APIXABAN 5 MG PO TABS
5.0000 mg | ORAL_TABLET | Freq: Two times a day (BID) | ORAL | Status: DC
  Administered 2017-03-20 (×2): 5 mg via ORAL
  Filled 2017-03-20 (×2): qty 1

## 2017-03-20 MED ORDER — DILTIAZEM HCL-DEXTROSE 100-5 MG/100ML-% IV SOLN (PREMIX): 5.0000 mg/h | INTRAVENOUS | Status: DC

## 2017-03-20 MED ORDER — LISINOPRIL 10 MG PO TABS
20.0000 mg | ORAL_TABLET | Freq: Every day | ORAL | Status: DC
  Administered 2017-03-20: 20 mg via ORAL
  Filled 2017-03-20: qty 2

## 2017-03-20 MED ORDER — ONDANSETRON HCL 4 MG PO TABS: 4.0000 mg | ORAL_TABLET | Freq: Four times a day (QID) | ORAL | Status: DC | PRN

## 2017-03-20 MED ORDER — METOPROLOL SUCCINATE ER 25 MG PO TB24
25.0000 mg | ORAL_TABLET | Freq: Every day | ORAL | Status: DC
  Administered 2017-03-20: 25 mg via ORAL
  Filled 2017-03-20: qty 1

## 2017-03-20 MED ORDER — UMECLIDINIUM-VILANTEROL 62.5-25 MCG/INH IN AEPB
1.0000 | INHALATION_SPRAY | Freq: Every day | RESPIRATORY_TRACT | Status: DC
  Administered 2017-03-20: 11:00:00 1 via RESPIRATORY_TRACT
  Filled 2017-03-20: qty 14

## 2017-03-20 MED ORDER — ACETAMINOPHEN 500 MG PO TABS: 1000.0000 mg | ORAL_TABLET | Freq: Four times a day (QID) | ORAL | Status: DC | PRN

## 2017-03-20 MED ORDER — SODIUM CHLORIDE 0.9 % IV SOLN
INTRAVENOUS | Status: DC
Start: 2017-03-20 — End: 2017-03-20
  Administered 2017-03-20: 01:00:00 via INTRAVENOUS

## 2017-03-20 MED ORDER — ONDANSETRON HCL 4 MG/2ML IJ SOLN: 4.0000 mg | Freq: Four times a day (QID) | INTRAMUSCULAR | Status: DC | PRN

## 2017-03-20 NOTE — Progress Notes (Signed)
Patient discharged to home take to car via wheelchair.

## 2017-03-20 NOTE — Care Management Note (Signed)
Case Management Note  Patient Details  Name: Courtney Berry MRN: [id number] Date of Birth: October 24, 1933  Subjective/Objective:  Adm with Afib with RVR. From home, ind. Uses [employer] at times. Has PCP, still drives at times, no issues affording medications. On Eliquis currently.                 Action/Plan: DC home with self care.    Expected Discharge Date:  03/20/17               Expected Discharge Plan:  Home/Self Care  In-House Referral:     Discharge planning Services  CM Consult  Post Acute Care Choice:    Choice offered to:  NA  DME Arranged:    DME Agency:     HH Arranged:    HH Agency:     Status of Service:  Completed, signed off  If discussed at [employer] of Stay Meetings, dates discussed:    Additional Comments:  Ashla Murph, Chrystine Oiler, RN 03/20/2017, 2:00 PM

## 2017-03-20 NOTE — Discharge Summary (Signed)
Physician Discharge Summary  Courtney Berry JQG:[id number] DOB: 1933-05-17 DOA: 03/19/2017  PCP: Remus Loffler, PA-C  Admit date: 03/19/2017 Discharge date: 03/20/2017  Admitted From: Home  Disposition:  Home  Recommendations for Outpatient Follow-up:  1. Follow up with PCP in 1-2 weeks 2. Schedule a follow up with cardiology within 2 weeks 3. Please obtain BMP/CBC in one week   Home Health: No Equipment/Devices: None   Discharge Condition: Stable CODE STATUS: Full Code Diet recommendation: Heart Healthy  Brief/Interim Summary: Courtney Berry  is a 83 y.o. female, With history of COPD, paroxysmal atrial fibrillation, rheumatoid arthritis who came to ED with complaints of palpitations. Patient says that she started having these symptoms after she used inhaler for COPD. She denies shortness of breath. No chest pain. Complains of chest discomfort during that episode. No nausea vomiting or diarrhea. Patient is currently on anticoagulation with eliquis for paroxysmal atrial fibrillation She denies fever, no dysuria.  In ED patient was found to be in A. fib with RVR, she was started on Cardizem drip. Heart rate has improved since starting on Cardizem.  Patient was taken off cardizem and restarted on her home metoprolol doses.  She was in normal sinus rhythm for >8hours prior to discharge.  She was instructed to continue her home medications and to follow up with her PCP as well as cardiology outpatient.  Discharge Diagnoses:  Active Problems:   Atrial fibrillation with RVR [facility])    Discharge Instructions  Discharge Instructions    Call MD for:  difficulty breathing, headache or visual disturbances    Complete by:  As directed    Call MD for:  extreme fatigue    Complete by:  As directed    Call MD for:  hives    Complete by:  As directed    Call MD for:  persistant dizziness or light-headedness    Complete by:  As directed    Call MD for:  persistant nausea and vomiting    Complete  by:  As directed    Call MD for:  severe uncontrolled pain    Complete by:  As directed    Call MD for:  temperature >100.4    Complete by:  As directed    Diet - low sodium heart healthy    Complete by:  As directed    Increase activity slowly    Complete by:  As directed      Allergies as of 03/20/2017      Reactions   Darvocet [propoxyphene N-acetaminophen] Other (See Comments)   unknown   Nitrofurantoin Monohyd Macro Other (See Comments)   unknown   Plavix [clopidogrel Bisulfate] Swelling   Face swells   Sulfa Antibiotics Other (See Comments)   "face got puffy"   Penicillins Rash      Medication List    STOP taking these medications   cephALEXin 250 MG capsule Commonly known as:  KEFLEX   Doxepin HCl 3 MG Tabs     TAKE these medications   acetaminophen 500 MG tablet Commonly known as:  TYLENOL Take 1,000 mg by mouth every 6 (six) hours as needed for moderate pain.   ANORO ELLIPTA 62.5-25 MCG/INH Aepb Generic drug:  umeclidinium-vilanterol Inhale 1 puff into the lungs daily.   apixaban 5 MG Tabs tablet Commonly known as:  ELIQUIS Take 1 tablet (5 mg total) by mouth 2 (two) times daily.   lisinopril 20 MG tablet Commonly known as:  PRINIVIL,ZESTRIL TAKE ONE (1) TABLET EACH  DAY   metoprolol succinate 25 MG 24 hr tablet Commonly known as:  TOPROL-XL TAKE ONE (1) TABLET EACH DAY   predniSONE 10 MG tablet Commonly known as:  DELTASONE Take 10 mg by mouth daily with breakfast.   PROAIR HFA 108 (90 Base) MCG/ACT inhaler Generic drug:  albuterol Inhale 1-2 puffs into the lungs every 6 (six) hours as needed for wheezing or shortness of breath.   VITAMIN D (CHOLECALCIFEROL) PO Take 1 tablet by mouth daily.       Allergies  Allergen Reactions  . Darvocet [Propoxyphene N-Acetaminophen] Other (See Comments)    unknown  . Nitrofurantoin Monohyd Macro Other (See Comments)    unknown  . Plavix [Clopidogrel Bisulfate] Swelling    Face swells  . Sulfa  Antibiotics Other (See Comments)    "face got puffy"  . Penicillins Rash    Consultations:  CM   Procedures/Studies: Dg Chest 2 View  Result Date: 03/19/2017 CLINICAL DATA:  Shortness of breath for the past 2 days. Chest pressure 2 hours ago. EXAM: CHEST  2 VIEW COMPARISON:  10/29/2016. FINDINGS: The cardiac silhouette remains borderline enlarged. No significant change in a small amount of linear scarring at the left lung base. Progressive elevation of the right hemidiaphragm. The aorta is mildly tortuous and calcified. Thoracolumbar spine degenerative changes. IMPRESSION: 1. No acute abnormality. 2. Progressive elevation of the right hemidiaphragm. 3. Stable mild left basilar linear scarring. Electronically Signed   By: Beckie Salts M.D.   On: 03/19/2017 21:35      Subjective: Patient seen and examined.  She reports that she feels great and wants to leave the hospital.  She believes this spell of atrial fibrillation may have been caused by over exerting herself with the recent nice weather.   Discharge Exam: Vitals:   03/20/17 1100 03/20/17 1200  BP: (!) 113/49   Pulse: 61   Resp: (!) 21   Temp:  98.3 F (36.8 C)   Vitals:   03/20/17 1000 03/20/17 1100 03/20/17 1128 03/20/17 1200  BP: (!) 130/59 (!) 113/49    Pulse: 61 61    Resp: 19 (!) 21    Temp:    98.3 F (36.8 C)  TempSrc:    Oral  SpO2: 97% 94% 96%   Weight:      Height:        General: Pt is alert, awake, not in acute distress Cardiovascular: RRR, S1/S2 +, no rubs, no gallops Respiratory: CTA bilaterally, no wheezing, no rhonchi Abdominal: Soft, NT, ND, bowel sounds + Extremities: no edema, no cyanosis    The results of significant diagnostics from this hospitalization (including imaging, microbiology, ancillary and laboratory) are listed below for reference.     Microbiology: No results found for this or any previous visit (from the past 240 hour(s)).   Labs: BNP (last 3 results)  Recent Labs   08/01/16 0906 11/14/16 0930  BNP 28.0 28.0   Basic Metabolic Panel:  Recent Labs Lab 03/19/17 2049 03/20/17 0538  NA 139 142  K 4.3 4.0  CL 104 105  CO2 26 30  GLUCOSE 124* 114*  BUN 22* 19  CREATININE 1.07* 1.03*  CALCIUM 9.2 9.2   Liver Function Tests:  Recent Labs Lab 03/19/17 2049 03/20/17 0538  AST 22 21  ALT 21 19  ALKPHOS 79 64  BILITOT 0.3 0.6  PROT 6.5 6.6  ALBUMIN 3.0* 3.0*   No results for input(s): LIPASE, AMYLASE in the last 168 hours. No results for  input(s): AMMONIA in the last 168 hours. CBC:  Recent Labs Lab 03/19/17 2049 03/20/17 0538  WBC 12.8* 11.9*  NEUTROABS 8.4*  --   HGB 13.3 13.4  HCT 42.5 43.0  MCV 95.5 96.0  PLT 218 200   Cardiac Enzymes:  Recent Labs Lab 03/19/17 2049 03/20/17 0021 03/20/17 0538 03/20/17 1158  TROPONINI <0.03 <0.03 <0.03 <0.03   BNP: Invalid input(s): POCBNP CBG: No results for input(s): GLUCAP in the last 168 hours. D-Dimer No results for input(s): DDIMER in the last 72 hours. Hgb A1c No results for input(s): HGBA1C in the last 72 hours. Lipid Profile No results for input(s): CHOL, HDL, LDLCALC, TRIG, CHOLHDL, LDLDIRECT in the last 72 hours. Thyroid function studies No results for input(s): TSH, T4TOTAL, T3FREE, THYROIDAB in the last 72 hours.  Invalid input(s): FREET3 Anemia work up No results for input(s): VITAMINB12, FOLATE, FERRITIN, TIBC, IRON, RETICCTPCT in the last 72 hours. Urinalysis    Component Value Date/Time   COLORURINE YELLOW 10/29/2016 1246   APPEARANCEUR CLEAR 10/29/2016 1246   LABSPEC 1.025 10/29/2016 1246   PHURINE 5.5 10/29/2016 1246   GLUCOSEU NEGATIVE 10/29/2016 1246   HGBUR NEGATIVE 10/29/2016 1246   BILIRUBINUR NEGATIVE 10/29/2016 1246   KETONESUR NEGATIVE 10/29/2016 1246   PROTEINUR NEGATIVE 10/29/2016 1246   NITRITE NEGATIVE 10/29/2016 1246   LEUKOCYTESUR TRACE (A) 10/29/2016 1246   Sepsis Labs Invalid input(s): PROCALCITONIN,  WBC,   LACTICIDVEN Microbiology No results found for this or any previous visit (from the past 240 hour(s)).   Time coordinating discharge: 30 minutes  SIGNED:   Katrinka Blazing, MD  [facility] 03/20/2017, 1:58 PM Pager [phone] If 7PM-7AM, please contact night-coverage [email or web address] Password [email or web address]

## 2017-03-20 NOTE — Care Management Obs Status (Signed)
MEDICARE OBSERVATION STATUS NOTIFICATION   Patient Details  Name: LANGLEY FLATLEY MRN: [id number] Date of Birth: Jul 15, 1933   Medicare Observation Status Notification Given:  Yes    Tacoya Altizer, Chrystine Oiler, RN 03/20/2017, 9:11 AM

## 2017-03-24 NOTE — Progress Notes (Signed)
Cardiology Office Note  Date: 03/25/2017   ID: Courtney Berry, DOB 04/24/1933, MRN [id number]  PCP: Remus Loffler, PA-C  Primary Cardiologist: Nona Dell, MD   Chief Complaint  Patient presents with  . Atrial Fibrillation    History of Present Illness: Courtney Berry is an 83 y.o. female last seen in February. Patient was recently admitted to [facility] with recurrent atrial fibrillation. She improved symptomatically with heart rate control. No evidence of ACS with negative troponin I levels. She is here today with her daughter for a follow-up visit. States that she is nearly back to baseline.  We discussed her medications, she has continued on Toprol-XL and Eliquis. We went over the possibility of adding an antiarrhythmic, but decided up titrate her beta blocker first and see how she does in terms of symptom control.  In terms of her lymphedema, she did improve significantly with mechanical compression and has been pleased with this overall.  Past Medical History:  Diagnosis Date  . COPD (chronic obstructive pulmonary disease) (HCC)   . Diverticulosis   . Essential hypertension, benign   . History of chest pain    Low risk Cardiolite 2010  . Mixed hyperlipidemia   . PAF (paroxysmal atrial fibrillation) (HCC)   . Rheumatoid arthritis [facility])     Past Surgical History:  Procedure Laterality Date  . TOTAL ABDOMINAL HYSTERECTOMY  1969    Current Outpatient Prescriptions  Medication Sig Dispense Refill  . acetaminophen (TYLENOL) 500 MG tablet Take 1,000 mg by mouth every 6 (six) hours as needed for moderate pain.    Marland Kitchen apixaban (ELIQUIS) 5 MG TABS tablet Take 1 tablet (5 mg total) by mouth 2 (two) times daily. 60 tablet 3  . lisinopril (PRINIVIL,ZESTRIL) 20 MG tablet TAKE ONE (1) TABLET EACH DAY 90 tablet 0  . predniSONE (DELTASONE) 10 MG tablet Take 10 mg by mouth daily with breakfast.    . PROAIR HFA 108 (90 Base) MCG/ACT inhaler Inhale 1-2 puffs into the lungs every  6 (six) hours as needed for wheezing or shortness of breath.     . umeclidinium-vilanterol (ANORO ELLIPTA) 62.5-25 MCG/INH AEPB Inhale 1 puff into the lungs daily.    Marland Kitchen VITAMIN D, CHOLECALCIFEROL, PO Take 1 tablet by mouth daily.     No current facility-administered medications for this visit.    Allergies:  Darvocet [propoxyphene n-acetaminophen]; Nitrofurantoin monohyd macro; Plavix [clopidogrel bisulfate]; Sulfa antibiotics; and Penicillins   Social History: The patient  reports that she has never smoked. She has never used smokeless tobacco. She reports that she does not drink alcohol or use drugs.   ROS:  Please see the history of present illness. Otherwise, complete review of systems is positive for mild leg edema.  All other systems are reviewed and negative.   Physical Exam: VS:  BP 122/68   Pulse 76   Ht 5\' 6"  (1.676 m)   Wt 224 lb (101.6 kg)   SpO2 96%   BMI 36.15 kg/m , BMI Body mass index is 36.15 kg/m.  Wt Readings from Last 3 Encounters:  03/25/17 224 lb (101.6 kg)  03/20/17 223 lb 1.7 oz (101.2 kg)  02/23/17 224 lb 9.6 oz (101.9 kg)    General: Patient appears comfortable at rest. HEENT: Conjunctiva and lids normal, oropharynx clear. Neck: Supple, no elevated JVP or carotid bruits, no thyromegaly. Lungs: Clear to auscultation, nonlabored breathing at rest. Cardiac: Regular rate and rhythm, no S3 or significant systolic murmur, no pericardial rub.  Abdomen: Soft, nontender, bowel sounds present, no guarding or rebound. Extremities: Significantly improved lymphedema, distal pulses 2+. Skin: Warm and dry. Musculoskeletal: No kyphosis. Neuropsychiatric: Alert and oriented 3, affect appropriate.  ECG: I personally reviewed the tracing from 03/19/2017 which showed rapid atrial fibrillation with low voltage.  Recent Labwork: 11/14/2016: B Natriuretic Peptide 28.0; TSH 2.025 03/20/2017: ALT 19; AST 21; BUN 19; Creatinine, Ser 1.03; Hemoglobin 13.4; Platelets 200;  Potassium 4.0; Sodium 142   Other Studies Reviewed Today:  Echocardiogram 11/19/2016: Study Conclusions  - Left ventricle: The cavity size was normal. Wall thickness was   increased in a pattern of mild LVH. Systolic function was normal.   The estimated ejection fraction was in the range of 60% to 65%.   Doppler parameters are consistent with abnormal left ventricular   relaxation (grade 1 diastolic dysfunction).  Assessment and Plan:  1. Recent episode of breakthrough paroxysmal atrial fibrillation. She converted to sinus rhythm with heart rate control. Plan at this time is to continue Eliquis, increase Toprol-XL to 25 mg in the morning and 12.5 mg in the evening. We could always consider antiarrhythmic therapy if symptoms are not well controlled.  2. Lymphedema, much better controlled after use of mechanical compression techniques.  3. Essential hypertension, blood pressure well controlled. She continues on lisinopril as well.  Current medicines were reviewed with the patient today.  Disposition: Follow-up in 3 months.  Signed, Jonelle Sidle, MD, [facility] 03/25/2017 11:18 AM    [facility] [address] [place], [place] [address] Phone: [phone]; Fax: [phone]

## 2017-03-25 ENCOUNTER — Encounter: Payer: Self-pay | Admitting: Cardiology

## 2017-03-25 ENCOUNTER — Ambulatory Visit (INDEPENDENT_AMBULATORY_CARE_PROVIDER_SITE_OTHER): Payer: Medicare Other | Admitting: Cardiology

## 2017-03-25 VITALS — BP 122/68 | HR 76 | Ht 66.0 in | Wt 224.0 lb

## 2017-03-25 DIAGNOSIS — I480 Paroxysmal atrial fibrillation: Secondary | ICD-10-CM | POA: Diagnosis not present

## 2017-03-25 DIAGNOSIS — I890 Lymphedema, not elsewhere classified: Secondary | ICD-10-CM | POA: Diagnosis not present

## 2017-03-25 DIAGNOSIS — I10 Essential (primary) hypertension: Secondary | ICD-10-CM | POA: Diagnosis not present

## 2017-03-25 MED ORDER — METOPROLOL SUCCINATE ER 25 MG PO TB24: 25.0000 mg | ORAL_TABLET | Freq: Every day | ORAL | 3 refills | Status: DC

## 2017-03-25 NOTE — Patient Instructions (Signed)
Medication Instructions:  TAKE TOPROL XL 25 MG (1 TABLET) IN THE MORNING & 12.5 MG (1/2 TABLET) IN THE EVENING  Labwork: NONE  Testing/Procedures: NONE  Follow-Up: Your physician recommends that you schedule a follow-up appointment in: 3 MONTHS   Any Other Special Instructions Will Be Listed Below (If Applicable).     If you need a refill on your cardiac medications before your next appointment, please call your pharmacy.

## 2017-03-26 ENCOUNTER — Other Ambulatory Visit: Payer: Self-pay | Admitting: Physician Assistant

## 2017-03-26 DIAGNOSIS — M159 Polyosteoarthritis, unspecified: Secondary | ICD-10-CM

## 2017-03-26 DIAGNOSIS — M150 Primary generalized (osteo)arthritis: Principal | ICD-10-CM

## 2017-05-09 ENCOUNTER — Other Ambulatory Visit: Payer: Self-pay | Admitting: Physician Assistant

## 2017-05-27 ENCOUNTER — Telehealth: Payer: Self-pay | Admitting: Physician Assistant

## 2017-05-27 MED ORDER — CEPHALEXIN 500 MG PO CAPS: 500.0000 mg | ORAL_CAPSULE | Freq: Four times a day (QID) | ORAL | 0 refills | Status: DC

## 2017-05-27 NOTE — Telephone Encounter (Signed)
sent 

## 2017-05-27 NOTE — Telephone Encounter (Signed)
Pt notified of RX 

## 2017-05-27 NOTE — Telephone Encounter (Signed)
What symptoms do you have? Red swollen legs. Hot.  How long have you been sick? 3-4 days  Have you been seen for this problem? yes  If your provider decides to give you a prescription, which pharmacy would you like for it to be sent to? The drug store in [place].   Patient informed that this information will be sent to the clinical staff for review and that they should receive a follow up call.

## 2017-06-04 DIAGNOSIS — R609 Edema, unspecified: Secondary | ICD-10-CM | POA: Diagnosis not present

## 2017-06-04 DIAGNOSIS — I4891 Unspecified atrial fibrillation: Secondary | ICD-10-CM | POA: Diagnosis not present

## 2017-06-04 DIAGNOSIS — E669 Obesity, unspecified: Secondary | ICD-10-CM | POA: Diagnosis not present

## 2017-06-04 DIAGNOSIS — J449 Chronic obstructive pulmonary disease, unspecified: Secondary | ICD-10-CM | POA: Diagnosis not present

## 2017-06-26 ENCOUNTER — Other Ambulatory Visit: Payer: Self-pay | Admitting: Physician Assistant

## 2017-06-26 DIAGNOSIS — M159 Polyosteoarthritis, unspecified: Secondary | ICD-10-CM

## 2017-06-26 DIAGNOSIS — M150 Primary generalized (osteo)arthritis: Principal | ICD-10-CM

## 2017-06-26 NOTE — Progress Notes (Signed)
Cardiology Office Note  Date: 06/29/2017   ID: Courtney Berry, DOB 04-22-1933, MRN [id number]  PCP: Remus Loffler, PA-C  Primary Cardiologist: Nona Dell, MD   Chief Complaint  Patient presents with  . PAF    History of Present Illness: Courtney Berry is an 83 y.o. female last seen in May. She is here today with her daughter for a follow-up visit. Reports no chest pain or palpitations. Stable NYHA class II dyspnea. She has a home mechanical compression device that she uses for management of chronic lymphedema.  I reviewed her medications which are outlined below. She reports no bleeding problems on Eliquis.  I personally reviewed her ECG today which shows sinus bradycardia.  She had follow-up lab work in May as reviewed below.  Past Medical History:  Diagnosis Date  . COPD (chronic obstructive pulmonary disease) (HCC)   . Diverticulosis   . Essential hypertension, benign   . History of chest pain    Low risk Cardiolite 2010  . Mixed hyperlipidemia   . PAF (paroxysmal atrial fibrillation) (HCC)   . Rheumatoid arthritis [facility])     Past Surgical History:  Procedure Laterality Date  . TOTAL ABDOMINAL HYSTERECTOMY  1969    Current Outpatient Prescriptions  Medication Sig Dispense Refill  . acetaminophen (TYLENOL) 500 MG tablet Take 1,000 mg by mouth every 6 (six) hours as needed for moderate pain.    Marland Kitchen apixaban (ELIQUIS) 5 MG TABS tablet Take 1 tablet (5 mg total) by mouth 2 (two) times daily. 60 tablet 3  . lisinopril (PRINIVIL,ZESTRIL) 20 MG tablet TAKE ONE (1) TABLET EACH DAY 90 tablet 0  . metoprolol succinate (TOPROL XL) 25 MG 24 hr tablet Take 1 tablet (25 mg total) by mouth daily. TAKE 25 MG IN THE MORNING AND 12.5 MG IN THE EVENING. 135 tablet 3  . PROAIR HFA 108 (90 Base) MCG/ACT inhaler Inhale 1-2 puffs into the lungs every 6 (six) hours as needed for wheezing or shortness of breath.     . umeclidinium-vilanterol (ANORO ELLIPTA) 62.5-25 MCG/INH AEPB  Inhale 1 puff into the lungs daily.    Marland Kitchen VITAMIN D, CHOLECALCIFEROL, PO Take 1 tablet by mouth daily.     No current facility-administered medications for this visit.    Allergies:  Darvocet [propoxyphene n-acetaminophen]; Nitrofurantoin monohyd macro; Plavix [clopidogrel bisulfate]; Sulfa antibiotics; and Penicillins   Social History: The patient  reports that she has never smoked. She has never used smokeless tobacco. She reports that she does not drink alcohol or use drugs.   ROS:  Please see the history of present illness. Otherwise, complete review of systems is positive for none.  All other systems are reviewed and negative.   Physical Exam: VS:  BP (!) 144/72   Pulse (!) 59   Ht 5\' 6"  (1.676 m)   Wt 231 lb (104.8 kg)   SpO2 93%   BMI 37.28 kg/m , BMI Body mass index is 37.28 kg/m.  Wt Readings from Last 3 Encounters:  06/29/17 231 lb (104.8 kg)  03/25/17 224 lb (101.6 kg)  03/20/17 223 lb 1.7 oz (101.2 kg)    General: Patient appears comfortable at rest. HEENT: Conjunctiva and lids normal, oropharynx clear. Neck: Supple, no elevated JVP or carotid bruits, no thyromegaly. Lungs: Clear to auscultation, nonlabored breathing at rest. Cardiac: Regular rate and rhythm, no S3 or significant systolic murmur, no pericardial rub. Abdomen: Soft, nontender, bowel sounds present, no guarding or rebound. Extremities: Relatively mild lymphedema, distal  pulses 2+. Skin: Warm and dry. Musculoskeletal: No kyphosis. Neuropsychiatric: Alert and oriented 3, affect appropriate.  ECG: I personally reviewed the tracing from 03/19/2017 which showed atrial fibrillation with RVR.  Recent Labwork: 11/14/2016: B Natriuretic Peptide 28.0; TSH 2.025 03/20/2017: ALT 19; AST 21; BUN 19; Creatinine, Ser 1.03; Hemoglobin 13.4; Platelets 200; Potassium 4.0; Sodium 142   Other Studies Reviewed Today:  Echocardiogram 11/19/2016: Study Conclusions  - Left ventricle: The cavity size was normal. Wall  thickness was increased in a pattern of mild LVH. Systolic function was normal. The estimated ejection fraction was in the range of 60% to 65%. Doppler parameters are consistent with abnormal left ventricular relaxation (grade 1 diastolic dysfunction).  Assessment and Plan:  1. Paroxysmal atrial fibrillation, currently maintaining sinus rhythm, ECG reviewed. We went over her lab work from May. At this point continue with current medical therapy including Toprol-XL and ELiquis.  2. Chronic lymphedema, significantly improved with use of mechanical compression device.  3. Essential hypertension, no changes made present regimen.  Current medicines were reviewed with the patient today.   Orders Placed This Encounter  Procedures  . EKG 12-Lead    Disposition: Follow-up in 6 months.  Signed, Jonelle Sidle, MD, [facility] 06/29/2017 10:23 AM    [facility] at [facility] [address] [place], [place] [address] Phone: [phone]; Fax: [phone]

## 2017-06-29 ENCOUNTER — Encounter: Payer: Self-pay | Admitting: Cardiology

## 2017-06-29 ENCOUNTER — Ambulatory Visit (INDEPENDENT_AMBULATORY_CARE_PROVIDER_SITE_OTHER): Payer: Medicare Other | Admitting: Cardiology

## 2017-06-29 VITALS — BP 144/72 | HR 59 | Ht 66.0 in | Wt 231.0 lb

## 2017-06-29 DIAGNOSIS — I480 Paroxysmal atrial fibrillation: Secondary | ICD-10-CM

## 2017-06-29 DIAGNOSIS — I890 Lymphedema, not elsewhere classified: Secondary | ICD-10-CM | POA: Diagnosis not present

## 2017-06-29 DIAGNOSIS — I10 Essential (primary) hypertension: Secondary | ICD-10-CM

## 2017-06-29 NOTE — Telephone Encounter (Signed)
Last seen 02/23/17  Courtney Berry

## 2017-06-29 NOTE — Patient Instructions (Addendum)

## 2017-07-21 ENCOUNTER — Encounter: Payer: Self-pay | Admitting: Physician Assistant

## 2017-07-21 ENCOUNTER — Ambulatory Visit (INDEPENDENT_AMBULATORY_CARE_PROVIDER_SITE_OTHER): Payer: Medicare Other | Admitting: Physician Assistant

## 2017-07-21 VITALS — BP 128/59 | HR 67 | Temp 97.4°F | Ht 66.0 in | Wt 230.0 lb

## 2017-07-21 DIAGNOSIS — N3001 Acute cystitis with hematuria: Secondary | ICD-10-CM | POA: Diagnosis not present

## 2017-07-21 DIAGNOSIS — R300 Dysuria: Secondary | ICD-10-CM

## 2017-07-21 DIAGNOSIS — S7002XA Contusion of left hip, initial encounter: Secondary | ICD-10-CM

## 2017-07-21 LAB — MICROSCOPIC EXAMINATION

## 2017-07-21 LAB — URINALYSIS, COMPLETE
Bilirubin, UA: NEGATIVE
Glucose, UA: NEGATIVE
Ketones, UA: NEGATIVE
NITRITE UA: NEGATIVE
PH UA: 6 (ref 5.0–7.5)
Protein, UA: NEGATIVE
Specific Gravity, UA: 1.015 (ref 1.005–1.030)
UUROB: 0.2 mg/dL (ref 0.2–1.0)

## 2017-07-21 MED ORDER — CIPROFLOXACIN HCL 500 MG PO TABS: 500.0000 mg | ORAL_TABLET | Freq: Two times a day (BID) | ORAL | 0 refills | Status: DC

## 2017-07-21 MED ORDER — PREDNISONE 10 MG PO TABS: 10.0000 mg | ORAL_TABLET | Freq: Every day | ORAL | 1 refills | Status: DC

## 2017-07-21 NOTE — Patient Instructions (Signed)
In a few days you may receive a survey in the mail or online from [employer] regarding your visit with us today. Please take a moment to fill this out. Your feedback is very important to our whole office. It can help us better understand your needs as well as improve your experience and satisfaction. Thank you for taking your time to complete it. We care about you.  Reuven Braver, PA-C  

## 2017-07-21 NOTE — Progress Notes (Signed)
BP (!) 128/59   Pulse 67   Temp (!) 97.4 F (36.3 C) (Oral)   Ht 5\' 6"  (1.676 m)   Wt 230 lb (104.3 kg)   BMI 37.12 kg/m    Subjective:    Patient ID: Courtney Berry, female    DOB: 1933/04/12, 83 y.o.   MRN: [id number]  HPI: Courtney Berry is a 83 y.o. female presenting on 07/21/2017 for Fall (hurt left hip) and foul odor to urine  She fell a couple of weeks ago and landed on left hip. There was pain in her left hand too. She tripped going up a stair in her house, landing on the hardwood floor. Due to her Eliquis she does have a significant hematoma on the area. She has continued to walk and not have any difficulty with that. When she bends her hip the hematoma seems to pop out more.  This patient has had several days of dysuria, frequency and nocturia. There is also pain over the bladder in the suprapubic region, no back pain. Denies leakage or hematuria.  Denies fever or chills. No pain in flank area.  Relevant past medical, surgical, family and social history reviewed and updated as indicated. Allergies and medications reviewed and updated.  Past Medical History:  Diagnosis Date  . COPD (chronic obstructive pulmonary disease) (HCC)   . Diverticulosis   . Essential hypertension, benign   . History of chest pain    Low risk Cardiolite 2010  . Mixed hyperlipidemia   . PAF (paroxysmal atrial fibrillation) (HCC)   . Rheumatoid arthritis [facility])     Past Surgical History:  Procedure Laterality Date  . TOTAL ABDOMINAL HYSTERECTOMY  1969    Review of Systems  Constitutional: Negative.  Negative for activity change, fatigue and fever.  HENT: Negative.   Eyes: Negative.   Respiratory: Negative.  Negative for cough.   Cardiovascular: Negative.  Negative for chest pain.  Gastrointestinal: Negative.  Negative for abdominal pain.  Endocrine: Negative.   Genitourinary: Positive for dysuria and urgency. Negative for enuresis.  Musculoskeletal: Negative.   Skin: Negative.     Neurological: Negative.     Allergies as of 07/21/2017      Reactions   Darvocet [propoxyphene N-acetaminophen] Other (See Comments)   unknown   Nitrofurantoin Monohyd Macro Other (See Comments)   unknown   Plavix [clopidogrel Bisulfate] Swelling   Face swells   Sulfa Antibiotics Other (See Comments)   "face got puffy"   Penicillins Rash      Medication List       Accurate as of 07/21/17  2:55 PM. Always use your most recent med list.          acetaminophen 500 MG tablet Commonly known as:  TYLENOL Take 1,000 mg by mouth every 6 (six) hours as needed for moderate pain.   ANORO ELLIPTA 62.5-25 MCG/INH Aepb Generic drug:  umeclidinium-vilanterol Inhale 1 puff into the lungs daily.   apixaban 5 MG Tabs tablet Commonly known as:  ELIQUIS Take 1 tablet (5 mg total) by mouth 2 (two) times daily.   lisinopril 20 MG tablet Commonly known as:  PRINIVIL,ZESTRIL TAKE ONE (1) TABLET EACH DAY   metoprolol succinate 25 MG 24 hr tablet Commonly known as:  TOPROL XL Take 1 tablet (25 mg total) by mouth daily. TAKE 25 MG IN THE MORNING AND 12.5 MG IN THE EVENING.   predniSONE 10 MG tablet Commonly known as:  DELTASONE Take 1 tablet (10 mg total) by  mouth daily with breakfast.   PROAIR HFA 108 (90 Base) MCG/ACT inhaler Generic drug:  albuterol Inhale 1-2 puffs into the lungs every 6 (six) hours as needed for wheezing or shortness of breath.   VITAMIN D (CHOLECALCIFEROL) PO Take 1 tablet by mouth daily.            Discharge Care Instructions        Start     Ordered   07/21/17 0000  predniSONE (DELTASONE) 10 MG tablet  Daily with breakfast    Question:  Supervising Provider  Answer:  Elenora Gamma   07/21/17 1428   07/21/17 0000  Urine Culture     07/21/17 1437   07/21/17 0000  Urinalysis, Complete     07/21/17 1437         Objective:    BP (!) 128/59   Pulse 67   Temp (!) 97.4 F (36.3 C) (Oral)   Ht 5\' 6"  (1.676 m)   Wt 230 lb (104.3 kg)   BMI  37.12 kg/m   Allergies  Allergen Reactions  . Darvocet [Propoxyphene N-Acetaminophen] Other (See Comments)    unknown  . Nitrofurantoin Monohyd Macro Other (See Comments)    unknown  . Plavix [Clopidogrel Bisulfate] Swelling    Face swells  . Sulfa Antibiotics Other (See Comments)    "face got puffy"  . Penicillins Rash    Physical Exam  Constitutional: She is oriented to person, place, and time. She appears well-developed and well-nourished.  HENT:  Head: Normocephalic and atraumatic.  Eyes: Pupils are equal, round, and reactive to light. Conjunctivae and EOM are normal.  Cardiovascular: Normal rate, regular rhythm, normal heart sounds and intact distal pulses.   Pulmonary/Chest: Effort normal and breath sounds normal.  Abdominal: Soft. Bowel sounds are normal.  Musculoskeletal: She exhibits edema and tenderness.       Left hip: She exhibits decreased range of motion and swelling.       Legs: Hematoma on left hip  Neurological: She is alert and oriented to person, place, and time. She has normal reflexes.  Skin: Skin is warm and dry. No rash noted.  Psychiatric: She has a normal mood and affect. Her behavior is normal. Judgment and thought content normal.        Assessment & Plan:   1. Hematoma of hip, left, initial encounter  2. Dysuria - Urine Culture - Urinalysis, Complete  3. Acute cystitis with hematuria - ciprofloxacin (CIPRO) 500 MG tablet; Take 1 tablet (500 mg total) by mouth 2 (two) times daily.  Dispense: 20 tablet; Refill: 0    Current Outpatient Prescriptions:  .  acetaminophen (TYLENOL) 500 MG tablet, Take 1,000 mg by mouth every 6 (six) hours as needed for moderate pain., Disp: , Rfl:  .  apixaban (ELIQUIS) 5 MG TABS tablet, Take 1 tablet (5 mg total) by mouth 2 (two) times daily., Disp: 60 tablet, Rfl: 3 .  lisinopril (PRINIVIL,ZESTRIL) 20 MG tablet, TAKE ONE (1) TABLET EACH DAY, Disp: 90 tablet, Rfl: 0 .  metoprolol succinate (TOPROL XL) 25 MG 24  hr tablet, Take 1 tablet (25 mg total) by mouth daily. TAKE 25 MG IN THE MORNING AND 12.5 MG IN THE EVENING., Disp: 135 tablet, Rfl: 3 .  PROAIR HFA 108 (90 Base) MCG/ACT inhaler, Inhale 1-2 puffs into the lungs every 6 (six) hours as needed for wheezing or shortness of breath. , Disp: , Rfl:  .  umeclidinium-vilanterol (ANORO ELLIPTA) 62.5-25 MCG/INH AEPB, Inhale 1 puff  into the lungs daily., Disp: , Rfl:  .  VITAMIN D, CHOLECALCIFEROL, PO, Take 1 tablet by mouth daily., Disp: , Rfl:  .  predniSONE (DELTASONE) 10 MG tablet, Take 1 tablet (10 mg total) by mouth daily with breakfast., Disp: 30 tablet, Rfl: 1 Continue all other maintenance medications as listed above.  Follow up plan: Return if symptoms worsen or fail to improve.  Educational handout given for survey  Remus Loffler PA-C [facility] [address]  [place], [place] [address] [phone]   07/21/2017, 2:55 PM

## 2017-07-25 LAB — URINE CULTURE

## 2017-08-07 ENCOUNTER — Other Ambulatory Visit: Payer: Self-pay | Admitting: Physician Assistant

## 2017-08-31 ENCOUNTER — Encounter: Payer: Self-pay | Admitting: Physician Assistant

## 2017-08-31 ENCOUNTER — Ambulatory Visit (INDEPENDENT_AMBULATORY_CARE_PROVIDER_SITE_OTHER): Payer: Medicare Other | Admitting: Physician Assistant

## 2017-08-31 VITALS — BP 145/66 | HR 70 | Temp 97.3°F | Ht 66.0 in | Wt 231.0 lb

## 2017-08-31 DIAGNOSIS — R300 Dysuria: Secondary | ICD-10-CM

## 2017-08-31 DIAGNOSIS — Z0000 Encounter for general adult medical examination without abnormal findings: Secondary | ICD-10-CM

## 2017-08-31 DIAGNOSIS — N3001 Acute cystitis with hematuria: Secondary | ICD-10-CM

## 2017-08-31 DIAGNOSIS — I890 Lymphedema, not elsewhere classified: Secondary | ICD-10-CM

## 2017-08-31 MED ORDER — BENZONATATE 200 MG PO CAPS: 200.0000 mg | ORAL_CAPSULE | Freq: Two times a day (BID) | ORAL | 2 refills | Status: DC | PRN

## 2017-08-31 MED ORDER — CIPROFLOXACIN HCL 500 MG PO TABS: 500.0000 mg | ORAL_TABLET | Freq: Two times a day (BID) | ORAL | 0 refills | Status: DC

## 2017-08-31 NOTE — Patient Instructions (Signed)
In a few days you may receive a survey in the mail or online from [employer] regarding your visit with us today. Please take a moment to fill this out. Your feedback is very important to our whole office. It can help us better understand your needs as well as improve your experience and satisfaction. Thank you for taking your time to complete it. We care about you.  Kessler Solly, PA-C  

## 2017-09-01 DIAGNOSIS — N3001 Acute cystitis with hematuria: Secondary | ICD-10-CM | POA: Insufficient documentation

## 2017-09-01 NOTE — Progress Notes (Signed)
BP (!) 145/66   Pulse 70   Temp (!) 97.3 F (36.3 C) (Oral)   Ht 5' 6" (1.676 m)   Wt 231 lb (104.8 kg)   BMI 37.28 kg/m    Subjective:    Patient ID: Courtney Berry, female    DOB: 1933/04/06, 83 y.o.   MRN: [id number]  HPI: Courtney Berry is a 83 y.o. female presenting on 08/31/2017 for Urinary Tract Infection  This patient has had several days of dysuria, frequency and nocturia. There is also pain over the bladder in the suprapubic region, no back pain. Denies leakage or hematuria.  Denies fever or chills. No pain in flank area.  Complains about Her continued lymphedema. She is still going to treatment.  Relevant past medical, surgical, family and social history reviewed and updated as indicated. Allergies and medications reviewed and updated.  Past Medical History:  Diagnosis Date  . COPD (chronic obstructive pulmonary disease) ([place])   . Diverticulosis   . Essential hypertension, benign   . History of chest pain    Low risk Cardiolite 2010  . Mixed hyperlipidemia   . PAF (paroxysmal atrial fibrillation) ([place])   . Rheumatoid arthritis [facility])     Past Surgical History:  Procedure Laterality Date  . TOTAL ABDOMINAL HYSTERECTOMY  1969    Review of Systems  Constitutional: Negative.   HENT: Negative.   Eyes: Negative.   Respiratory: Negative.   Cardiovascular: Positive for leg swelling. Negative for chest pain and palpitations.  Gastrointestinal: Negative.   Genitourinary: Positive for difficulty urinating, dysuria and urgency. Negative for flank pain.    Allergies as of 08/31/2017      Reactions   Darvocet [propoxyphene N-acetaminophen] Other (See Comments)   unknown   Nitrofurantoin Monohyd Macro Other (See Comments)   unknown   Plavix [clopidogrel Bisulfate] Swelling   Face swells   Sulfa Antibiotics Other (See Comments)   "face got puffy"   Penicillins Rash      Medication List       Accurate as of 08/31/17 11:59 PM. Always use your most recent  med list.          acetaminophen 500 MG tablet Commonly known as:  TYLENOL Take 1,000 mg by mouth every 6 (six) hours as needed for moderate pain.   ANORO ELLIPTA 62.5-25 MCG/INH Aepb Generic drug:  umeclidinium-vilanterol Inhale 1 puff into the lungs daily.   apixaban 5 MG Tabs tablet Commonly known as:  ELIQUIS Take 1 tablet (5 mg total) by mouth 2 (two) times daily.   benzonatate 200 MG capsule Commonly known as:  TESSALON Take 1 capsule (200 mg total) by mouth 2 (two) times daily as needed for cough.   ciprofloxacin 500 MG tablet Commonly known as:  CIPRO Take 1 tablet (500 mg total) by mouth 2 (two) times daily.   lisinopril 20 MG tablet Commonly known as:  PRINIVIL,ZESTRIL TAKE ONE (1) TABLET EACH DAY   metoprolol succinate 25 MG 24 hr tablet Commonly known as:  TOPROL XL Take 1 tablet (25 mg total) by mouth daily. TAKE 25 MG IN THE MORNING AND 12.5 MG IN THE EVENING.   PROAIR HFA 108 (90 Base) MCG/ACT inhaler Generic drug:  albuterol Inhale 1-2 puffs into the lungs every 6 (six) hours as needed for wheezing or shortness of breath.   VITAMIN D (CHOLECALCIFEROL) PO Take 1 tablet by mouth daily.          Objective:    BP (!) 145/66  Pulse 70   Temp (!) 97.3 F (36.3 C) (Oral)   Ht '5\' 6"'$  (1.676 m)   Wt 231 lb (104.8 kg)   BMI 37.28 kg/m   Allergies  Allergen Reactions  . Darvocet [Propoxyphene N-Acetaminophen] Other (See Comments)    unknown  . Nitrofurantoin Monohyd Macro Other (See Comments)    unknown  . Plavix [Clopidogrel Bisulfate] Swelling    Face swells  . Sulfa Antibiotics Other (See Comments)    "face got puffy"  . Penicillins Rash    Physical Exam  Constitutional: She is oriented to person, place, and time. She appears well-developed and well-nourished.  HENT:  Head: Normocephalic and atraumatic.  Eyes: Pupils are equal, round, and reactive to light. Conjunctivae are normal.  Cardiovascular: Normal rate, regular rhythm, normal  heart sounds and intact distal pulses.   Pulmonary/Chest: Effort normal and breath sounds normal.  Abdominal: Soft. Bowel sounds are normal. She exhibits no distension and no mass. There is tenderness in the suprapubic area. There is no rebound, no guarding and no CVA tenderness.  Neurological: She is alert and oriented to person, place, and time. She has normal reflexes.  Skin: Skin is warm and dry. No rash noted.  Psychiatric: She has a normal mood and affect. Her behavior is normal. Judgment and thought content normal.    Results for orders placed or performed in visit on 07/21/17  Urine Culture  Result Value Ref Range   Urine Culture, Routine Final report (A)    Organism ID, Bacteria Serratia marcescens (A)    ORGANISM ID, BACTERIA Enterococcus faecalis (A)    Antimicrobial Susceptibility Comment   Microscopic Examination  Result Value Ref Range   WBC, UA 6-10 (A) 0 - 5 /hpf   RBC, UA 3-10 (A) 0 - 2 /hpf   Epithelial Cells (non renal) >10 (A) 0 - 10 /hpf   Renal Epithel, UA 0-10 (A) None seen /hpf   Bacteria, UA Few (A) None seen/Few   Yeast, UA Present None seen  Urinalysis, Complete  Result Value Ref Range   Specific Gravity, UA 1.015 1.005 - 1.030   pH, UA 6.0 5.0 - 7.5   Color, UA Yellow Yellow   Appearance Ur Clear Clear   Leukocytes, UA 1+ (A) Negative   Protein, UA Negative Negative/Trace   Glucose, UA Negative Negative   Ketones, UA Negative Negative   RBC, UA 1+ (A) Negative   Bilirubin, UA Negative Negative   Urobilinogen, Ur 0.2 0.2 - 1.0 mg/dL   Nitrite, UA Negative Negative   Microscopic Examination See below:       Assessment & Plan:   1. Dysuria - Urine Culture - Urinalysis, Complete  2. Acute cystitis with hematuria - ciprofloxacin (CIPRO) 500 MG tablet; Take 1 tablet (500 mg total) by mouth 2 (two) times daily.  Dispense: 20 tablet; Refill: 0  3. Lymphedema  4. Well adult health check - CBC with Differential/Platelet; Future - CMP14+EGFR;  Future - Lipid panel; Future - TSH; Future    Current Outpatient Prescriptions:  .  acetaminophen (TYLENOL) 500 MG tablet, Take 1,000 mg by mouth every 6 (six) hours as needed for moderate pain., Disp: , Rfl:  .  apixaban (ELIQUIS) 5 MG TABS tablet, Take 1 tablet (5 mg total) by mouth 2 (two) times daily., Disp: 60 tablet, Rfl: 3 .  lisinopril (PRINIVIL,ZESTRIL) 20 MG tablet, TAKE ONE (1) TABLET EACH DAY, Disp: 90 tablet, Rfl: 1 .  metoprolol succinate (TOPROL XL) 25 MG 24 hr  tablet, Take 1 tablet (25 mg total) by mouth daily. TAKE 25 MG IN THE MORNING AND 12.5 MG IN THE EVENING., Disp: 135 tablet, Rfl: 3 .  PROAIR HFA 108 (90 Base) MCG/ACT inhaler, Inhale 1-2 puffs into the lungs every 6 (six) hours as needed for wheezing or shortness of breath. , Disp: , Rfl:  .  umeclidinium-vilanterol (ANORO ELLIPTA) 62.5-25 MCG/INH AEPB, Inhale 1 puff into the lungs daily., Disp: , Rfl:  .  VITAMIN D, CHOLECALCIFEROL, PO, Take 1 tablet by mouth daily., Disp: , Rfl:  .  benzonatate (TESSALON) 200 MG capsule, Take 1 capsule (200 mg total) by mouth 2 (two) times daily as needed for cough., Disp: 20 capsule, Rfl: 2 .  ciprofloxacin (CIPRO) 500 MG tablet, Take 1 tablet (500 mg total) by mouth 2 (two) times daily., Disp: 20 tablet, Rfl: 0 Continue all other maintenance medications as listed above.  Follow up plan: Return in about 6 months (around 03/01/2018) for recheck.  Educational handout given for [place] PA-C [place] [address] [place] [address]  [place], [place] [address] [phone]   09/01/2017, 9:05 AM

## 2017-09-16 ENCOUNTER — Other Ambulatory Visit: Payer: Self-pay | Admitting: Physician Assistant

## 2017-09-30 ENCOUNTER — Telehealth: Payer: Self-pay | Admitting: Physician Assistant

## 2017-09-30 MED ORDER — AZITHROMYCIN 250 MG PO TABS: ORAL_TABLET | ORAL | 0 refills | Status: DC

## 2017-09-30 NOTE — Telephone Encounter (Signed)
Patient aware of recommendation.  

## 2017-10-05 ENCOUNTER — Other Ambulatory Visit: Payer: Medicare Other

## 2017-10-05 DIAGNOSIS — Z0000 Encounter for general adult medical examination without abnormal findings: Secondary | ICD-10-CM | POA: Diagnosis not present

## 2017-10-05 DIAGNOSIS — I10 Essential (primary) hypertension: Secondary | ICD-10-CM | POA: Diagnosis not present

## 2017-10-05 DIAGNOSIS — R6889 Other general symptoms and signs: Secondary | ICD-10-CM | POA: Diagnosis not present

## 2017-10-06 LAB — CBC WITH DIFFERENTIAL/PLATELET
BASOS: 0 %
Basophils Absolute: 0 10*3/uL (ref 0.0–0.2)
EOS (ABSOLUTE): 0.3 10*3/uL (ref 0.0–0.4)
EOS: 3 %
HEMATOCRIT: 39.8 % (ref 34.0–46.6)
Hemoglobin: 12.9 g/dL (ref 11.1–15.9)
IMMATURE GRANS (ABS): 0 10*3/uL (ref 0.0–0.1)
Immature Granulocytes: 0 %
Lymphocytes Absolute: 3.4 10*3/uL — ABNORMAL HIGH (ref 0.7–3.1)
Lymphs: 36 %
MCH: 29.4 pg (ref 26.6–33.0)
MCHC: 32.4 g/dL (ref 31.5–35.7)
MCV: 91 fL (ref 79–97)
MONOS ABS: 0.8 10*3/uL (ref 0.1–0.9)
Monocytes: 8 %
NEUTROS ABS: 4.9 10*3/uL (ref 1.4–7.0)
Neutrophils: 53 %
PLATELETS: 188 10*3/uL (ref 150–379)
RBC: 4.39 x10E6/uL (ref 3.77–5.28)
RDW: 13.9 % (ref 12.3–15.4)
WBC: 9.3 10*3/uL (ref 3.4–10.8)

## 2017-10-06 LAB — CMP14+EGFR
A/G RATIO: 1.3 (ref 1.2–2.2)
ALBUMIN: 3.4 g/dL — AB (ref 3.5–4.7)
ALT: 14 IU/L (ref 0–32)
AST: 12 IU/L (ref 0–40)
Alkaline Phosphatase: 63 IU/L (ref 39–117)
BUN / CREAT RATIO: 17 (ref 12–28)
BUN: 16 mg/dL (ref 8–27)
Bilirubin Total: 0.4 mg/dL (ref 0.0–1.2)
CALCIUM: 9.3 mg/dL (ref 8.7–10.3)
CO2: 27 mmol/L (ref 20–29)
CREATININE: 0.95 mg/dL (ref 0.57–1.00)
Chloride: 106 mmol/L (ref 96–106)
GFR, EST AFRICAN AMERICAN: 65 mL/min/{1.73_m2} (ref >59)
GFR, EST NON AFRICAN AMERICAN: 56 mL/min/{1.73_m2} — AB (ref >59)
GLOBULIN, TOTAL: 2.7 g/dL (ref 1.5–4.5)
Glucose: 93 mg/dL (ref 65–99)
POTASSIUM: 3.9 mmol/L (ref 3.5–5.2)
SODIUM: 146 mmol/L — AB (ref 134–144)
Total Protein: 6.1 g/dL (ref 6.0–8.5)

## 2017-10-06 LAB — LIPID PANEL
CHOLESTEROL TOTAL: 190 mg/dL (ref 100–199)
Chol/HDL Ratio: 2.8 ratio (ref 0.0–4.4)
HDL: 68 mg/dL (ref >39)
LDL Calculated: 90 mg/dL (ref 0–99)
Triglycerides: 160 mg/dL — ABNORMAL HIGH (ref 0–149)
VLDL CHOLESTEROL CAL: 32 mg/dL (ref 5–40)

## 2017-10-06 LAB — TSH: TSH: 2.41 u[IU]/mL (ref 0.450–4.500)

## 2017-11-11 ENCOUNTER — Other Ambulatory Visit: Payer: Self-pay | Admitting: Physician Assistant

## 2017-11-11 ENCOUNTER — Other Ambulatory Visit: Payer: Self-pay | Admitting: Cardiology

## 2017-11-11 NOTE — Telephone Encounter (Signed)
Last seen 08/31/17  [facility]

## 2017-11-12 DIAGNOSIS — J449 Chronic obstructive pulmonary disease, unspecified: Secondary | ICD-10-CM | POA: Diagnosis not present

## 2017-11-12 DIAGNOSIS — R0602 Shortness of breath: Secondary | ICD-10-CM | POA: Diagnosis not present

## 2017-11-12 DIAGNOSIS — I4891 Unspecified atrial fibrillation: Secondary | ICD-10-CM | POA: Diagnosis not present

## 2017-11-12 DIAGNOSIS — E669 Obesity, unspecified: Secondary | ICD-10-CM | POA: Diagnosis not present

## 2017-11-13 ENCOUNTER — Emergency Department (HOSPITAL_COMMUNITY)
Admission: EM | Admit: 2017-11-13 | Discharge: 2017-11-14 | Disposition: A | Discharge status: Home / Self Care | Payer: Medicare Other | Attending: Emergency Medicine | Admitting: Emergency Medicine

## 2017-11-13 ENCOUNTER — Encounter (HOSPITAL_COMMUNITY): Payer: Self-pay | Admitting: Emergency Medicine

## 2017-11-13 ENCOUNTER — Emergency Department (HOSPITAL_COMMUNITY): Payer: Medicare Other

## 2017-11-13 ENCOUNTER — Other Ambulatory Visit: Payer: Self-pay

## 2017-11-13 DIAGNOSIS — Z79899 Other long term (current) drug therapy: Secondary | ICD-10-CM | POA: Diagnosis not present

## 2017-11-13 DIAGNOSIS — I4891 Unspecified atrial fibrillation: Secondary | ICD-10-CM | POA: Diagnosis not present

## 2017-11-13 DIAGNOSIS — Z7901 Long term (current) use of anticoagulants: Secondary | ICD-10-CM | POA: Insufficient documentation

## 2017-11-13 DIAGNOSIS — R002 Palpitations: Secondary | ICD-10-CM | POA: Diagnosis not present

## 2017-11-13 DIAGNOSIS — Z885 Allergy status to narcotic agent status: Secondary | ICD-10-CM | POA: Insufficient documentation

## 2017-11-13 DIAGNOSIS — R600 Localized edema: Secondary | ICD-10-CM | POA: Insufficient documentation

## 2017-11-13 DIAGNOSIS — J449 Chronic obstructive pulmonary disease, unspecified: Secondary | ICD-10-CM | POA: Diagnosis not present

## 2017-11-13 DIAGNOSIS — Z880 Allergy status to penicillin: Secondary | ICD-10-CM | POA: Diagnosis not present

## 2017-11-13 DIAGNOSIS — I10 Essential (primary) hypertension: Secondary | ICD-10-CM | POA: Diagnosis not present

## 2017-11-13 DIAGNOSIS — R531 Weakness: Secondary | ICD-10-CM | POA: Diagnosis not present

## 2017-11-13 LAB — COMPREHENSIVE METABOLIC PANEL
ALK PHOS: 74 U/L (ref 38–126)
ALT: 20 U/L (ref 14–54)
ANION GAP: 12 (ref 5–15)
AST: 20 U/L (ref 15–41)
Albumin: 3.2 g/dL — ABNORMAL LOW (ref 3.5–5.0)
BILIRUBIN TOTAL: 0.6 mg/dL (ref 0.3–1.2)
BUN: 16 mg/dL (ref 6–20)
CALCIUM: 9.3 mg/dL (ref 8.9–10.3)
CO2: 27 mmol/L (ref 22–32)
CREATININE: 1.11 mg/dL — AB (ref 0.44–1.00)
Chloride: 104 mmol/L (ref 101–111)
GFR calc non Af Amer: 45 mL/min — ABNORMAL LOW (ref >60)
GFR, EST AFRICAN AMERICAN: 52 mL/min — AB (ref >60)
GLUCOSE: 140 mg/dL — AB (ref 65–99)
Potassium: 3.9 mmol/L (ref 3.5–5.1)
Sodium: 143 mmol/L (ref 135–145)
TOTAL PROTEIN: 7 g/dL (ref 6.5–8.1)

## 2017-11-13 LAB — CBC WITH DIFFERENTIAL/PLATELET
Basophils Absolute: 0 10*3/uL (ref 0.0–0.1)
Basophils Relative: 0 %
Eosinophils Absolute: 0.4 10*3/uL (ref 0.0–0.7)
Eosinophils Relative: 4 %
HEMATOCRIT: 42.6 % (ref 36.0–46.0)
HEMOGLOBIN: 13 g/dL (ref 12.0–15.0)
LYMPHS ABS: 2.8 10*3/uL (ref 0.7–4.0)
LYMPHS PCT: 31 %
MCH: 29.7 pg (ref 26.0–34.0)
MCHC: 30.5 g/dL (ref 30.0–36.0)
MCV: 97.5 fL (ref 78.0–100.0)
MONOS PCT: 10 %
Monocytes Absolute: 0.9 10*3/uL (ref 0.1–1.0)
NEUTROS ABS: 5.1 10*3/uL (ref 1.7–7.7)
NEUTROS PCT: 55 %
Platelets: 197 10*3/uL (ref 150–400)
RBC: 4.37 MIL/uL (ref 3.87–5.11)
RDW: 13.3 % (ref 11.5–15.5)
WBC: 9.1 10*3/uL (ref 4.0–10.5)

## 2017-11-13 LAB — TROPONIN I: Troponin I: <0.03 ng/mL (ref <0.03)

## 2017-11-13 LAB — BRAIN NATRIURETIC PEPTIDE: B NATRIURETIC PEPTIDE 5: 29 pg/mL (ref 0.0–100.0)

## 2017-11-13 MED ORDER — DILTIAZEM HCL-DEXTROSE 100-5 MG/100ML-% IV SOLN (PREMIX)
5.0000 mg/h | INTRAVENOUS | Status: DC
  Administered 2017-11-13: 5 mg/h via INTRAVENOUS
  Filled 2017-11-13: qty 100

## 2017-11-13 MED ORDER — METOPROLOL TARTRATE 50 MG PO TABS
50.0000 mg | ORAL_TABLET | Freq: Once | ORAL | Status: AC
  Administered 2017-11-13: 50 mg via ORAL
  Filled 2017-11-13: qty 1

## 2017-11-13 MED ORDER — DILTIAZEM LOAD VIA INFUSION
20.0000 mg | Freq: Once | INTRAVENOUS | Status: AC
  Administered 2017-11-13: 20 mg via INTRAVENOUS
  Filled 2017-11-13: qty 20

## 2017-11-13 NOTE — ED Provider Notes (Signed)
[facility] EMERGENCY DEPARTMENT Provider Note   CSN: [id number] Arrival date & time: 11/13/17  1911     History   Chief Complaint Chief Complaint  Patient presents with  . Palpitations    HPI Courtney Berry is a 83 y.o. female.  HPI  The patient is a atrial fibrillation patient of Dr. Diona Browner, she has a known history of paroxysmal atrial fibrillation for which she is seen and treated by the cardiology service, she takes metoprolol daily, lisinopril and Eliquis twice a day.  The patient was in her usual state of health until this morning when she woke up and noticed that her heart was racing and fluttering, she has had some generalized weakness but no other acute symptoms including shortness of breath chest pain significant swelling of the legs headache blurred vision nausea vomiting diarrhea fevers or chills.  She has taken her medications as per her usual but continues to have the palpitations.  Because this has been persistent she is come to the emergency department to seek evaluation and treatment.  The patient denies ever having been cardioverted and states that she always gets medication to slow her heart down  Past Medical History:  Diagnosis Date  . COPD (chronic obstructive pulmonary disease) (HCC)   . Diverticulosis   . Essential hypertension, benign   . History of chest pain    Low risk Cardiolite 2010  . Mixed hyperlipidemia   . PAF (paroxysmal atrial fibrillation) (HCC)   . Rheumatoid arthritis [facility])     Patient Active Problem List   Diagnosis Date Noted  . Acute cystitis with hematuria 09/01/2017  . Lymphedema 08/31/2017  . Lymphedema of both lower extremities 02/09/2017  . Hypotension due to drugs 11/24/2016  . Pain in both lower legs 11/24/2016  . Rheumatoid arthritis involving multiple sites with positive rheumatoid factor (HCC) 10/20/2016  . Peripheral vascular disease (HCC) 10/20/2016  . Atrial fibrillation with rapid ventricular response (HCC)  08/01/2016  . Atrial fibrillation with RVR (HCC) 08/01/2016  . Bradycardia 08/01/2016  . Atrial fibrillation, new onset (HCC) 02/14/2016  . Chronic anticoagulation 02/14/2016  . Palpitations 03/02/2012  . Dyslipidemia 09/03/2009  . Essential hypertension 09/03/2009  . Shortness of breath 09/03/2009    Past Surgical History:  Procedure Laterality Date  . TOTAL ABDOMINAL HYSTERECTOMY  1969    OB History    No data available       Home Medications    Prior to Admission medications   Medication Sig Start Date End Date Taking? Authorizing Provider  acetaminophen (TYLENOL) 500 MG tablet Take 1,000 mg by mouth every 6 (six) hours as needed for moderate pain.   Yes [provider]  ELIQUIS 5 MG TABS tablet TAKE ONE TABLET BY MOUTH TWICE DAILY 11/11/17  Yes Jonelle Sidle, MD  Fluticasone-Umeclidin-Vilant (TRELEGY ELLIPTA) 100-62.5-25 MCG/INH AEPB Inhale 1 puff into the lungs daily.   Yes [provider]  lisinopril (PRINIVIL,ZESTRIL) 20 MG tablet TAKE ONE (1) TABLET EACH DAY 08/07/17  Yes Remus Loffler, PA-C  metoprolol succinate (TOPROL XL) 25 MG 24 hr tablet Take 1 tablet (25 mg total) by mouth daily. TAKE 25 MG IN THE MORNING AND 12.5 MG IN THE EVENING. 03/25/17  Yes Jonelle Sidle, MD  PROAIR HFA 108 [phone] Base) MCG/ACT inhaler Inhale 1-2 puffs into the lungs every 6 (six) hours as needed for wheezing or shortness of breath.  09/23/16  Yes [provider]  VITAMIN D, CHOLECALCIFEROL, PO Take 1 tablet by mouth  daily.   Yes [provider]    Family History Family History  Problem Relation Age of Onset  . Cancer Father   . Cancer Mother   . Heart disease Sister        CABG age 70  . Cancer Brother     Social History Social History   Tobacco Use  . Smoking status: Never Smoker  . Smokeless tobacco: Never Used  . Tobacco comment: Tobacco use-no  Substance Use Topics  . Alcohol use: No    Alcohol/week: 0.0 oz  . Drug use: No      Allergies   Darvocet [propoxyphene n-acetaminophen]; Nitrofurantoin monohyd macro; Plavix [clopidogrel bisulfate]; Sulfa antibiotics; and Penicillins   Review of Systems Review of Systems  All other systems reviewed and are negative.    Physical Exam Updated Vital Signs BP (!) 95/53   Pulse 76   Temp 98 F (36.7 C) (Oral)   Resp (!) 29   Ht 5\' 8"  (1.727 m)   Wt 104.8 kg (231 lb)   SpO2 94%   BMI 35.12 kg/m   Physical Exam  Constitutional: She appears well-developed and well-nourished. No distress.  HENT:  Head: Normocephalic and atraumatic.  Mouth/Throat: Oropharynx is clear and moist. No oropharyngeal exudate.  Eyes: Conjunctivae and EOM are normal. Pupils are equal, round, and reactive to light. Right eye exhibits no discharge. Left eye exhibits no discharge. No scleral icterus.  Neck: Normal range of motion. Neck supple. No JVD present. No thyromegaly present.  Cardiovascular: Normal heart sounds and intact distal pulses. Exam reveals no gallop and no friction rub.  No murmur heard. Irregularly irregular rhythm, normal pulses at the radial arteries, mild bilateral peripheral edema  Pulmonary/Chest: Effort normal and breath sounds normal. No respiratory distress. She has no wheezes. She has no rales.  Abdominal: Soft. Bowel sounds are normal. She exhibits no distension and no mass. There is no tenderness.  Musculoskeletal: Normal range of motion. She exhibits no edema or tenderness.  Lymphadenopathy:    She has no cervical adenopathy.  Neurological: She is alert. Coordination normal.  Skin: Skin is warm and dry. No rash noted. No erythema.  Psychiatric: She has a normal mood and affect. Her behavior is normal.  Nursing note and vitals reviewed.    ED Treatments / Results  Labs (all labs ordered are listed, but only abnormal results are displayed) Labs Reviewed  COMPREHENSIVE METABOLIC PANEL - Abnormal; Notable for the following components:      Result Value    Glucose, Bld 140 (*)    Creatinine, Ser 1.11 (*)    Albumin 3.2 (*)    GFR calc non Af Amer 45 (*)    GFR calc Af Amer 52 (*)    All other components within normal limits  CBC WITH DIFFERENTIAL/PLATELET  TROPONIN I  BRAIN NATRIURETIC PEPTIDE  TROPONIN I    EKG  EKG Interpretation  Date/Time:  Friday November 13 2017 19:23:17 EST Ventricular Rate:  140 PR Interval:    QRS Duration: 76 QT Interval:  302 QTC Calculation: 461 R Axis:   7 Text Interpretation:  Atrial fibrillation with rapid ventricular response ST & T wave abnormality, consider lateral ischemia Abnormal ECG since last tracing no significant change Confirmed by 05-19-2002 (Eber Hong) on 11/13/2017 7:37:02 PM       Radiology Dg Chest Port 1 View  Result Date: 11/13/2017 CLINICAL DATA:  Palpitation EXAM: PORTABLE CHEST 1 VIEW COMPARISON:  03/19/2017 FINDINGS: Mild cardiomegaly.  No consolidation  or effusion.  No pneumothorax. IMPRESSION: Mild cardiomegaly.  Negative for edema or infiltrate. Electronically Signed   By: Jasmine Pang M.D.   On: 11/13/2017 20:11    Procedures Procedures (including critical care time)  Medications Ordered in ED Medications  diltiazem (CARDIZEM) 1 mg/mL load via infusion 20 mg (20 mg Intravenous Bolus from Bag 11/13/17 2031)  metoprolol tartrate (LOPRESSOR) tablet 50 mg (50 mg Oral Given 11/13/17 2151)     Initial Impression / Assessment and Plan / ED Course  I have reviewed the triage vital signs and the nursing notes.  Pertinent labs & imaging results that were available during my care of the patient were reviewed by me and considered in my medical decision making (see chart for details).  Clinical Course as of Nov 13 2334  Fri Nov 13, 2017  2123 Discussed with the cardiologist, Dr. Cristina Gong who is on-call for cardiology this evening who recommended that the patient get at least a second troponin and have good rate control prior to going home and follow-up locally with  cardiology.  [BM]    Clinical Course User Index [BM] Eber Hong, MD   The patient's EKG is consistent with having atrial fibrillation with a rapid ventricular rate.  She does not have any pulmonary symptoms and has a history of COPD, she was seen and treated by her pulmonologist, Dr. Juanetta Gosling yesterday.  She was recently stopped on her Ellipta inhaler and started on a new medication, she is not sure of the name of that medication.  The patient refuses cardioversion, she will be treated with Cardizem load and drip, cardiac monitoring labs  After the patient was given the bolus and low dose drip of the cardizem, she had heart rate in the 80's with some irregularity - remained in afib.  After I discussed the case with the cardiologist, I also discussed it with the patient and her spouse and child - they have chosen to go home since she is rate controlled, feeling good and without SOB or CP.  She has the occasional brief palpitation and her repeat exam is unremarkable as is her vital signs measurements.  Patient is aware of the indications for return including increased shortness of breath chest pain palpitations weakness or any other worsening symptoms.  I have encouraged her to be seen on Monday morning in the cardiology office and she expressed understanding.  Final Clinical Impressions(s) / ED Diagnoses   Final diagnoses:  Atrial fibrillation with RVR [facility])    ED Discharge Orders    None       Eber Hong, MD 11/13/17 [phone]

## 2017-11-13 NOTE — Discharge Instructions (Signed)
If symptoms worsen such as worsening chest pain, difficulty breathing or shortness of breath then you MUST return to the ER immediately - otherwise, take your medicines exactly as prescribed by your doctor and call the office of your cardiologist (see above) on Monday for follow up early next week.

## 2017-11-13 NOTE — ED Triage Notes (Signed)
palpitations on and off since this morning

## 2017-11-30 NOTE — Progress Notes (Signed)
Cardiology Office Note    Date:  12/01/2017   ID:  Courtney ABBETT, DOB May 29, 1933, MRN [id number]  PCP:  Remus Loffler, PA-C  Cardiologist: Dr. Diona Browner  Chief Complaint  Patient presents with  . Follow-up    Recent Emergency Dept Visit    History of Present Illness:    Courtney Berry is a 84 y.o. female with past medical history of PAF (on Eliquis), HTN, RA, COPD and Lymphedema who presents to the office today for Emergency Dept follow-up.   She was last examined by Dr. Diona Browner in 06/2017 and denied any recent chest pain or palpitations at that time. She was continued on her current medication regimen including Eliquis for anticoagulation and Toprol-XL for rate-control.   In the interim, she presented to [facility] ED on 11/13/2017 for evaluation of palpitations which had been present since earlier that morning. She took her medications as prescribed but symptoms persisted, therefore she went to the ED for further evaluation. Initial EKG showed atrial fibrillation with RVR, HR in the 140's. She was started on IV Cardizem and HR improved into the 80's. She declined DCCV while in the ED and with her initial and delta troponin values being negative, she was discharged from the ED with close outpatient Cardiology follow-up recommended.   In talking with the patient today, she reports doing well since her recent Emergency Department visit. She denies any recurrent palpitations or dyspnea. No recent chest pain, dyspnea on exertion, orthopnea, PND, or lower extremity edema.   She reports good compliance with her medication regimen and denies missing any recent doses. No evidence of active bleeding with Eliquis.   She does not check her HR or BP regularly at home but both are well-controlled during her office visit today.    Past Medical History:  Diagnosis Date  . COPD (chronic obstructive pulmonary disease) (HCC)   . Diverticulosis   . Essential hypertension, benign   . History  of chest pain    Low risk Cardiolite 2010  . Mixed hyperlipidemia   . PAF (paroxysmal atrial fibrillation) (HCC)   . Rheumatoid arthritis [facility])     Past Surgical History:  Procedure Laterality Date  . TOTAL ABDOMINAL HYSTERECTOMY  1969    Current Medications: Outpatient Medications Prior to Visit  Medication Sig Dispense Refill  . acetaminophen (TYLENOL) 500 MG tablet Take 1,000 mg by mouth every 6 (six) hours as needed for moderate pain.    Marland Kitchen ELIQUIS 5 MG TABS tablet TAKE ONE TABLET BY MOUTH TWICE DAILY 60 tablet 3  . Fluticasone-Umeclidin-Vilant (TRELEGY ELLIPTA) 100-62.5-25 MCG/INH AEPB Inhale 1 puff into the lungs daily.    Marland Kitchen lisinopril (PRINIVIL,ZESTRIL) 20 MG tablet TAKE ONE (1) TABLET EACH DAY 90 tablet 1  . metoprolol succinate (TOPROL XL) 25 MG 24 hr tablet Take 1 tablet (25 mg total) by mouth daily. TAKE 25 MG IN THE MORNING AND 12.5 MG IN THE EVENING. 135 tablet 3  . PROAIR HFA 108 (90 Base) MCG/ACT inhaler Inhale 1-2 puffs into the lungs every 6 (six) hours as needed for wheezing or shortness of breath.     Marland Kitchen VITAMIN D, CHOLECALCIFEROL, PO Take 1 tablet by mouth daily.     No facility-administered medications prior to visit.      Allergies:   Darvocet [propoxyphene n-acetaminophen]; Nitrofurantoin monohyd macro; Plavix [clopidogrel bisulfate]; Sulfa antibiotics; and Penicillins   Social History   Socioeconomic History  . Marital status: Married    Spouse name: None  .  Number of children: None  . Years of education: None  . Highest education level: None  Social Needs  . Financial resource strain: None  . Food insecurity - worry: None  . Food insecurity - inability: None  . Transportation needs - medical: None  . Transportation needs - non-medical: None  Occupational History  . Occupation: Retired  Tobacco Use  . Smoking status: Never Smoker  . Smokeless tobacco: Never Used  . Tobacco comment: Tobacco use-no  Substance and Sexual Activity  . Alcohol use: No      Alcohol/week: 0.0 oz  . Drug use: No  . Sexual activity: None  Other Topics Concern  . None  Social History Narrative  . None     Family History:  The patient's family history includes Cancer in her brother, father, and mother; Heart disease in her sister.   Review of Systems:   Please see the history of present illness.     General:  No chills, fever, night sweats or weight changes.  Cardiovascular:  No chest pain, dyspnea on exertion, edema, orthopnea, paroxysmal nocturnal dyspnea. Positive for palpitations (now improved).  Dermatological: No rash, lesions/masses Respiratory: No cough, dyspnea Urologic: No hematuria, dysuria Abdominal:   No nausea, vomiting, diarrhea, bright red blood per rectum, melena, or hematemesis Neurologic:  No visual changes, wkns, changes in mental status. All other systems reviewed and are otherwise negative except as noted above.   Physical Exam:    VS:  BP 138/80   Pulse 60   Ht 5\' 6"  (1.676 m)   Wt 233 lb (105.7 kg)   SpO2 96%   BMI 37.61 kg/m    General: Well developed, well nourished Caucasian female appearing in no acute distress. Head: Normocephalic, atraumatic, sclera non-icteric, no xanthomas, nares are without discharge.  Neck: No carotid bruits. JVD not elevated.  Lungs: Respirations regular and unlabored, without wheezes or rales.  Heart: Regular rate and rhythm. No S3 or S4.  No murmur, no rubs, or gallops appreciated. Abdomen: Soft, non-tender, non-distended with normoactive bowel sounds. No hepatomegaly. No rebound/guarding. No obvious abdominal masses. Msk:  Strength and tone appear normal for age. No joint deformities or effusions. Extremities: No clubbing or cyanosis. No lower extremity edema.  Distal pedal pulses are 2+ bilaterally. Neuro: Alert and oriented X 3. Moves all extremities spontaneously. No focal deficits noted. Psych:  Responds to questions appropriately with a normal affect. Skin: No rashes or lesions  noted  Wt Readings from Last 3 Encounters:  12/01/17 233 lb (105.7 kg)  11/13/17 231 lb (104.8 kg)  08/31/17 231 lb (104.8 kg)     Studies/Labs Reviewed:   EKG:  EKG is ordered today. The ekg ordered today demonstrates sinus bradycardia, HR 57, with no acute ST or T-wave changes.   Recent Labs: 10/05/2017: TSH 2.410 11/13/2017: ALT 20; B Natriuretic Peptide 29.0; BUN 16; Creatinine, Ser 1.11; Hemoglobin 13.0; Platelets 197; Potassium 3.9; Sodium 143   Lipid Panel    Component Value Date/Time   CHOL 190 10/05/2017 0927   TRIG 160 (H) 10/05/2017 0927   HDL 68 10/05/2017 0927   CHOLHDL 2.8 10/05/2017 0927   LDLCALC 90 10/05/2017 0927    Additional studies/ records that were reviewed today include:   Echocardiogram: 11/2016 Study Conclusions  - Left ventricle: The cavity size was normal. Wall thickness was   increased in a pattern of mild LVH. Systolic function was normal.   The estimated ejection fraction was in the range of 60% to 65%.  Doppler parameters are consistent with abnormal left ventricular   relaxation (grade 1 diastolic dysfunction).  Assessment:    1. PAF (paroxysmal atrial fibrillation) (HCC)   2. Long term current use of anticoagulant   3. Essential hypertension   4. Lymphedema      Plan:   In order of problems listed above:  1. Paroxysmal Atrial Fibrillation/ Use of Long-Term Anticoagulation - the patient has a known history of atrial fibrillation and was recently evaluated in the ED for worsening palpitations and found to be in atrial fibrillation with RVR, HR in the 140's. HR improved with IV Cardizem and she was discharged on her PTA medication regimen.  - she denies any recurrent palpitations and her breathing has been at baseline. Maintaining NSR by EKG today with HR in the 50's. Will continue on Lopressor at current dosing. I advised her she could take an extra Toprol-XL tablet if needed for persistent palpitations but would not increase her  baseline dosing secondary to her current HR.  - she denies any evidence of active bleeding. Continue on Eliquis for anticoagulation.   2. HTN - BP is well-controlled at 138/80 during today's visit. - continue Toprol-XL 25mg  in AM/12.5mg  in PM along with Lisinopril 20mg  daily.   3. Lymphedema - appears euvolemic on examination. Continue with use of compression devices.    Medication Adjustments/Labs and Tests Ordered: Current medicines are reviewed at length with the patient today.  Concerns regarding medicines are outlined above.  Medication changes, Labs and Tests ordered today are listed in the Patient Instructions below. Patient Instructions  Medication Instructions:  Your physician recommends that you continue on your current medications as directed. Please refer to the Current Medication list given to you today. You May Take an Extra Toprol XL 25 mg for Palpitations   Labwork: NONE   Testing/Procedures: NONE   Follow-Up: Your physician wants you to follow-up in: 6 Months with Dr. Diona Browner. You will receive a reminder letter in the mail two months in advance. If you don't receive a letter, please call our office to schedule the follow-up appointment.   Any Other Special Instructions Will Be Listed Below (If Applicable).  If you need a refill on your cardiac medications before your next appointment, please call your pharmacy. Thank you for choosing [facility]!     Signed, Ellsworth Lennox, PA-C  12/01/2017 6:55 PM    [facility] [address] [place], [place] [address] Phone: [phone]

## 2017-12-01 ENCOUNTER — Ambulatory Visit (INDEPENDENT_AMBULATORY_CARE_PROVIDER_SITE_OTHER): Payer: Medicare Other | Admitting: Student

## 2017-12-01 ENCOUNTER — Encounter: Payer: Self-pay | Admitting: Student

## 2017-12-01 VITALS — BP 138/80 | HR 60 | Ht 66.0 in | Wt 233.0 lb

## 2017-12-01 DIAGNOSIS — I480 Paroxysmal atrial fibrillation: Secondary | ICD-10-CM

## 2017-12-01 DIAGNOSIS — Z7901 Long term (current) use of anticoagulants: Secondary | ICD-10-CM

## 2017-12-01 DIAGNOSIS — I890 Lymphedema, not elsewhere classified: Secondary | ICD-10-CM | POA: Diagnosis not present

## 2017-12-01 DIAGNOSIS — I10 Essential (primary) hypertension: Secondary | ICD-10-CM

## 2017-12-01 NOTE — Patient Instructions (Signed)
Medication Instructions:  Your physician recommends that you continue on your current medications as directed. Please refer to the Current Medication list given to you today. You May Take an Extra Toprol XL 25 mg for Palpitations   Labwork: NONE   Testing/Procedures: NONE   Follow-Up: Your physician wants you to follow-up in: 6 Months with Dr. Diona Browner. You will receive a reminder letter in the mail two months in advance. If you don't receive a letter, please call our office to schedule the follow-up appointment.   Any Other Special Instructions Will Be Listed Below (If Applicable).     If you need a refill on your cardiac medications before your next appointment, please call your pharmacy. Thank you for choosing [facility]!

## 2017-12-25 ENCOUNTER — Other Ambulatory Visit: Payer: Self-pay | Admitting: Physician Assistant

## 2017-12-29 ENCOUNTER — Other Ambulatory Visit: Payer: Self-pay | Admitting: *Deleted

## 2017-12-29 MED ORDER — PREDNISONE 10 MG (21) PO TBPK: ORAL_TABLET | ORAL | 0 refills | Status: DC

## 2017-12-29 NOTE — Telephone Encounter (Signed)
Medication sent.

## 2017-12-29 NOTE — Telephone Encounter (Signed)
Patient aware.

## 2018-01-19 ENCOUNTER — Other Ambulatory Visit: Payer: Self-pay | Admitting: Cardiology

## 2018-02-04 ENCOUNTER — Ambulatory Visit (INDEPENDENT_AMBULATORY_CARE_PROVIDER_SITE_OTHER): Payer: Medicare Other | Admitting: Nurse Practitioner

## 2018-02-04 ENCOUNTER — Encounter: Payer: Self-pay | Admitting: Nurse Practitioner

## 2018-02-04 VITALS — BP 171/74 | HR 75 | Temp 97.5°F | Ht 66.0 in | Wt 240.0 lb

## 2018-02-04 DIAGNOSIS — M109 Gout, unspecified: Secondary | ICD-10-CM

## 2018-02-04 MED ORDER — COLCHICINE 0.6 MG PO TABS: ORAL_TABLET | ORAL | 1 refills | Status: DC

## 2018-02-04 NOTE — Patient Instructions (Signed)

## 2018-02-04 NOTE — Progress Notes (Addendum)
   Subjective:    Patient ID: Courtney Berry, female    DOB: 05/05/1933, 84 y.o.   MRN: [id number]  HPI Patient comes in today c/o left first toe pain. Said that it hurts for anything to touch it. Has never had in past.     Review of Systems  Constitutional: Negative.   HENT: Negative.   Respiratory: Negative.   Cardiovascular: Negative.   Musculoskeletal: Positive for arthralgias (left first toe).  Neurological: Negative.   Psychiatric/Behavioral: Negative.   All other systems reviewed and are negative.      Objective:   Physical Exam  Constitutional: She is oriented to person, place, and time. She appears well-developed and well-nourished. No distress.  Cardiovascular: Normal rate and regular rhythm.  Pulmonary/Chest: Effort normal and breath sounds normal.  Musculoskeletal:  MIP joint of left great toe is erythematous and edematous and very painful to touch.  Neurological: She is alert and oriented to person, place, and time.  Skin: Skin is warm and dry.  Psychiatric: She has a normal mood and affect. Her behavior is normal. Judgment and thought content normal.   BP (!) 171/74   Pulse 75   Temp (!) 97.5 F (36.4 C) (Oral)   Ht 5\' 6"  (1.676 m)   Wt 240 lb (108.9 kg)   BMI 38.74 kg/m       Assessment & Plan:   1. Acute gout involving toe of left foot, unspecified cause    Meds ordered this encounter  Medications  . colchicine 0.6 MG tablet    Sig: 2 at pain onset may repeat 1 tablet in 1 hour x1    Dispense:  20 tablet    Refill:  1    Order Specific Question:   Supervising Provider    Answer:   , CAROL L [[id number]]   Orders Placed This Encounter  Procedures  . Arthritis Panel   Low purine diet RTO Prn   Mary-Margaret Oswaldo Done, FNP  Uric acid level came back on higher end of normal- still could be gout but WBC elevated- going to send in cipro rx just in case it is infection. RTO Monday if not improving. Discussed plan of care with  patient. Mary-Margaret Thursday, FNP

## 2018-02-05 ENCOUNTER — Other Ambulatory Visit: Payer: Self-pay | Admitting: Physician Assistant

## 2018-02-05 LAB — ARTHRITIS PANEL
BASOS: 0 %
Basophils Absolute: 0 10*3/uL (ref 0.0–0.2)
EOS (ABSOLUTE): 0 10*3/uL (ref 0.0–0.4)
Eos: 0 %
HEMOGLOBIN: 12.9 g/dL (ref 11.1–15.9)
Hematocrit: 40.1 % (ref 34.0–46.6)
IMMATURE GRANS (ABS): 0 10*3/uL (ref 0.0–0.1)
Immature Granulocytes: 0 %
LYMPHS: 14 %
Lymphocytes Absolute: 1.7 10*3/uL (ref 0.7–3.1)
MCH: 30.3 pg (ref 26.6–33.0)
MCHC: 32.2 g/dL (ref 31.5–35.7)
MCV: 94 fL (ref 79–97)
Monocytes Absolute: 0.7 10*3/uL (ref 0.1–0.9)
Monocytes: 6 %
NEUTROS ABS: 9.6 10*3/uL — AB (ref 1.4–7.0)
Neutrophils: 80 %
Platelets: 232 10*3/uL (ref 150–379)
RBC: 4.26 x10E6/uL (ref 3.77–5.28)
RDW: 13.3 % (ref 12.3–15.4)
RHEUMATOID FACTOR: 11 [IU]/mL (ref 0.0–13.9)
Sed Rate: 36 mm/hr (ref 0–40)
URIC ACID: 7.1 mg/dL (ref 2.5–7.1)
WBC: 12 10*3/uL — AB (ref 3.4–10.8)

## 2018-02-05 MED ORDER — CIPROFLOXACIN HCL 500 MG PO TABS: 500.0000 mg | ORAL_TABLET | Freq: Two times a day (BID) | ORAL | 0 refills | Status: DC

## 2018-02-05 NOTE — Addendum Note (Signed)
Addended by: Bennie Pierini on: 02/05/2018 01:22 PM   Modules accepted: Orders

## 2018-02-08 ENCOUNTER — Ambulatory Visit: Payer: Medicare Other | Admitting: Physician Assistant

## 2018-02-11 ENCOUNTER — Ambulatory Visit (INDEPENDENT_AMBULATORY_CARE_PROVIDER_SITE_OTHER): Payer: Medicare Other | Admitting: Physician Assistant

## 2018-02-11 ENCOUNTER — Encounter: Payer: Self-pay | Admitting: Physician Assistant

## 2018-02-11 VITALS — BP 123/67 | HR 72 | Temp 97.0°F | Ht 66.0 in | Wt 235.6 lb

## 2018-02-11 DIAGNOSIS — I10 Essential (primary) hypertension: Secondary | ICD-10-CM | POA: Diagnosis not present

## 2018-02-11 DIAGNOSIS — M79662 Pain in left lower leg: Secondary | ICD-10-CM

## 2018-02-11 DIAGNOSIS — M79661 Pain in right lower leg: Secondary | ICD-10-CM | POA: Diagnosis not present

## 2018-02-11 DIAGNOSIS — I890 Lymphedema, not elsewhere classified: Secondary | ICD-10-CM

## 2018-02-11 MED ORDER — PREDNISONE 10 MG PO TABS
20.0000 mg | ORAL_TABLET | Freq: Every day | ORAL | 1 refills | Status: DC
Start: 2018-02-11 — End: 2018-03-05

## 2018-02-14 NOTE — Progress Notes (Signed)
BP 123/67   Pulse 72   Temp (!) 97 F (36.1 C) (Oral)   Ht 5\' 6"  (1.676 m)   Wt 235 lb 9.6 oz (106.9 kg)   BMI 38.03 kg/m    Subjective:    Patient ID: , female    DOB: [birth year], 84 y.o.   MRN: [id number]  HPI: Courtney Berry is a 84 y.o. female presenting on 02/11/2018 for Edema (feet and leg ) and Arthritis  This patient comes in for periodic recheck on medications and conditions including hypertension, lymphedema, leg swelling.  In the past few days she has had increased swelling in the left leg.  She mainly has swelling happen in the right leg.  She has leg pumps to use at home.  She has not been able using for the last couple days because the left side is slightly red and irritated.  She has had no break in the skin.  She has not seen any weeping.  She denies any chest pain, shortness of breath, fever or chills..   All medications are reviewed today. There are no reports of any problems with the medications. All of the medical conditions are reviewed and updated.  Lab work is reviewed and will be ordered as medically necessary. There are no new problems reported with today's visit.   Past Medical History:  Diagnosis Date  . COPD (chronic obstructive pulmonary disease) (HCC)   . Diverticulosis   . Essential hypertension, benign   . History of chest pain    Low risk Cardiolite 2010  . Mixed hyperlipidemia   . PAF (paroxysmal atrial fibrillation) (HCC)   . Rheumatoid arthritis (HCC)    Relevant past medical, surgical, family and social history reviewed and updated as indicated. Interim medical history since our last visit reviewed. Allergies and medications reviewed and updated. DATA REVIEWED: CHART IN EPIC  Family History reviewed for pertinent findings.  Review of Systems  Constitutional: Negative.  Negative for activity change, fatigue and fever.  HENT: Negative.   Eyes: Negative.   Respiratory: Negative.  Negative for cough, shortness of breath and  wheezing.   Cardiovascular: Positive for palpitations and leg swelling. Negative for chest pain.  Gastrointestinal: Negative.  Negative for abdominal pain.  Endocrine: Negative.   Genitourinary: Negative.  Negative for dysuria.  Musculoskeletal: Negative.   Skin: Positive for color change and rash.  Neurological: Negative.     Allergies as of 02/11/2018      Reactions   Darvocet [propoxyphene N-acetaminophen] Other (See Comments)   unknown   Nitrofurantoin Monohyd Macro Other (See Comments)   unknown   Plavix [clopidogrel Bisulfate] Swelling   Face swells   Sulfa Antibiotics Other (See Comments)   "face got puffy"   Penicillins Rash   Has patient had a PCN reaction causing immediate rash, facial/tongue/throat swelling, SOB or lightheadedness with hypotension: Yes Has patient had a PCN reaction causing severe rash involving mucus membranes or skin necrosis: No Has patient had a PCN reaction that required hospitalization: No Has patient had a PCN reaction occurring within the last 10 years: No If all of the above answers are "NO", then may proceed with Cephalosporin use.      Medication List        Accurate as of 02/11/18 11:59 PM. Always use your most recent med list.          acetaminophen 500 MG tablet Commonly known as:  TYLENOL Take 1,000 mg by mouth every 6 (six) hours  as needed for moderate pain.   ciprofloxacin 500 MG tablet Commonly known as:  CIPRO Take 1 tablet (500 mg total) by mouth 2 (two) times daily.   colchicine 0.6 MG tablet 2 at pain onset may repeat 1 tablet in 1 hour x1   ELIQUIS 5 MG Tabs tablet Generic drug:  apixaban TAKE ONE TABLET BY MOUTH TWICE DAILY   lisinopril 20 MG tablet Commonly known as:  PRINIVIL,ZESTRIL TAKE ONE (1) TABLET EACH DAY   metoprolol succinate 25 MG 24 hr tablet Commonly known as:  TOPROL-XL TAKE 1 TABLET EVERY MORNING AND TAKE 1/2 TABLET EVERY EVENING   predniSONE 10 MG tablet Commonly known as:  DELTASONE Take 2  tablets (20 mg total) by mouth daily with breakfast.   PROAIR HFA 108 (90 Base) MCG/ACT inhaler Generic drug:  albuterol Inhale 1-2 puffs into the lungs every 6 (six) hours as needed for wheezing or shortness of breath.   TRELEGY ELLIPTA 100-62.5-25 MCG/INH Aepb Generic drug:  Fluticasone-Umeclidin-Vilant Inhale 1 puff into the lungs daily.   VITAMIN D (CHOLECALCIFEROL) PO Take 1 tablet by mouth daily.          Objective:    BP 123/67   Pulse 72   Temp (!) 97 F (36.1 C) (Oral)   Ht 5\' 6"  (1.676 m)   Wt 235 lb 9.6 oz (106.9 kg)   BMI 38.03 kg/m   Allergies  Allergen Reactions  . Darvocet [Propoxyphene N-Acetaminophen] Other (See Comments)    unknown  . Nitrofurantoin Monohyd Macro Other (See Comments)    unknown  . Plavix [Clopidogrel Bisulfate] Swelling    Face swells  . Sulfa Antibiotics Other (See Comments)    "face got puffy"  . Penicillins Rash    Has patient had a PCN reaction causing immediate rash, facial/tongue/throat swelling, SOB or lightheadedness with hypotension: Yes Has patient had a PCN reaction causing severe rash involving mucus membranes or skin necrosis: No Has patient had a PCN reaction that required hospitalization: No Has patient had a PCN reaction occurring within the last 10 years: No If all of the above answers are "NO", then may proceed with Cephalosporin use.     Wt Readings from Last 3 Encounters:  02/11/18 235 lb 9.6 oz (106.9 kg)  02/04/18 240 lb (108.9 kg)  12/01/17 233 lb (105.7 kg)    Physical Exam  Constitutional: She is oriented to person, place, and time. She appears well-developed and well-nourished.  HENT:  Head: Normocephalic and atraumatic.  Eyes: Pupils are equal, round, and reactive to light. Conjunctivae and EOM are normal.  Cardiovascular: Normal rate, regular rhythm, normal heart sounds and intact distal pulses.  2 + edema bilateral lower legs  Pulmonary/Chest: Effort normal and breath sounds normal.  Abdominal:  Soft. Bowel sounds are normal.  Neurological: She is alert and oriented to person, place, and time. She has normal reflexes.  Skin: Skin is warm and dry. No rash noted. There is erythema.     Psychiatric: She has a normal mood and affect. Her behavior is normal. Judgment and thought content normal.    Results for orders placed or performed in visit on 02/04/18  Arthritis Panel  Result Value Ref Range   Uric Acid 7.1 2.5 - 7.1 mg/dL   Rhuematoid fact SerPl-aCnc 11.0 0.0 - 13.9 IU/mL   WBC 12.0 (H) 3.4 - 10.8 x10E3/uL   RBC 4.26 3.77 - 5.28 x10E6/uL   Hemoglobin 12.9 11.1 - 15.9 g/dL   Hematocrit 08.1 44.8 - 46.6 %  MCV 94 79 - 97 fL   MCH 30.3 26.6 - 33.0 pg   MCHC 32.2 31.5 - 35.7 g/dL   RDW 65.5 37.4 - 82.7 %   Platelets 232 150 - 379 x10E3/uL   Neutrophils 80 Not Estab. %   Lymphs 14 Not Estab. %   Monocytes 6 Not Estab. %   Eos 0 Not Estab. %   Basos 0 Not Estab. %   Neutrophils Absolute 9.6 (H) 1.4 - 7.0 x10E3/uL   Lymphocytes Absolute 1.7 0.7 - 3.1 x10E3/uL   Monocytes Absolute 0.7 0.1 - 0.9 x10E3/uL   EOS (ABSOLUTE) 0.0 0.0 - 0.4 x10E3/uL   Basophils Absolute 0.0 0.0 - 0.2 x10E3/uL   Immature Granulocytes 0 Not Estab. %   Immature Grans (Abs) 0.0 0.0 - 0.1 x10E3/uL   Sed Rate 36 0 - 40 mm/hr      Assessment & Plan:   There are no diagnoses linked to this encounter.  Continue all other maintenance medications as listed above.  Follow up plan: Return in about 2 months (around 04/13/2018) for recheck.  Educational handout given for survey  Remus Loffler PA-C [facility] [address]  [place], [place] [address] [phone]   02/14/2018, 9:58 PM

## 2018-02-18 ENCOUNTER — Encounter: Payer: Self-pay | Admitting: *Deleted

## 2018-02-25 ENCOUNTER — Ambulatory Visit: Payer: Medicare Other

## 2018-03-04 ENCOUNTER — Ambulatory Visit: Payer: Medicare Other | Admitting: *Deleted

## 2018-03-05 ENCOUNTER — Other Ambulatory Visit: Payer: Self-pay | Admitting: Physician Assistant

## 2018-03-08 NOTE — Telephone Encounter (Signed)
Last seen 3/28./19  Angel 

## 2018-03-10 ENCOUNTER — Ambulatory Visit (INDEPENDENT_AMBULATORY_CARE_PROVIDER_SITE_OTHER): Payer: Medicare Other | Admitting: *Deleted

## 2018-03-10 ENCOUNTER — Encounter: Payer: Self-pay | Admitting: *Deleted

## 2018-03-10 VITALS — BP 142/56 | HR 57 | Ht 66.0 in | Wt 238.0 lb

## 2018-03-10 DIAGNOSIS — Z0000 Encounter for general adult medical examination without abnormal findings: Secondary | ICD-10-CM

## 2018-03-10 NOTE — Patient Instructions (Signed)
Please bring a copy of your healthcare power of attorney and living will to our office at your convenience to be filed in your chart.  The goal you set is to lose weight, and to work on this you plan to reduce sweets and bread intake.    Thank you for coming in for your Annual Wellness Visit today!   Preventive Care 82 Years and Older, Female Preventive care refers to lifestyle choices and visits with your health care provider that can promote health and wellness. What does preventive care include?  A yearly physical exam. This is also called an annual well check.  Dental exams once or twice a year.  Routine eye exams. Ask your health care provider how often you should have your eyes checked.  Personal lifestyle choices, including: ? Daily care of your teeth and gums. ? Regular physical activity. ? Eating a healthy diet. ? Avoiding tobacco and drug use. ? Limiting alcohol use. ? Practicing safe sex. ? Taking low-dose aspirin every day. ? Taking vitamin and mineral supplements as recommended by your health care provider. What happens during an annual well check? The services and screenings done by your health care provider during your annual well check will depend on your age, overall health, lifestyle risk factors, and family history of disease. Counseling Your health care provider may ask you questions about your:  Alcohol use.  Tobacco use.  Drug use.  Emotional well-being.  Home and relationship well-being.  Sexual activity.  Eating habits.  History of falls.  Memory and ability to understand (cognition).  Work and work Statistician.  Reproductive health.  Screening You may have the following tests or measurements:  Height, weight, and BMI.  Blood pressure.  Lipid and cholesterol levels. These may be checked every 5 years, or more frequently if you are over 77 years old.  Skin check.  Lung cancer screening. You may have this screening every year  starting at age 76 if you have a 30-pack-year history of smoking and currently smoke or have quit within the past 15 years.  Fecal occult blood test (FOBT) of the stool. You may have this test every year starting at age 30.  Flexible sigmoidoscopy or colonoscopy. You may have a sigmoidoscopy every 5 years or a colonoscopy every 10 years starting at age 5.  Hepatitis C blood test.  Hepatitis B blood test.  Sexually transmitted disease (STD) testing.  Diabetes screening. This is done by checking your blood sugar (glucose) after you have not eaten for a while (fasting). You may have this done every 1-3 years.  Bone density scan. This is done to screen for osteoporosis. You may have this done starting at age 53.  Mammogram. This may be done every 1-2 years. Talk to your health care provider about how often you should have regular mammograms.  Talk with your health care provider about your test results, treatment options, and if necessary, the need for more tests. Vaccines Your health care provider may recommend certain vaccines, such as:  Influenza vaccine. This is recommended every year.  Tetanus, diphtheria, and acellular pertussis (Tdap, Td) vaccine. You may need a Td booster every 10 years.  Varicella vaccine. You may need this if you have not been vaccinated.  Zoster vaccine. You may need this after age 35.  Measles, mumps, and rubella (MMR) vaccine. You may need at least one dose of MMR if you were born in 1957 or later. You may also need a second dose.  Pneumococcal  13-valent conjugate (PCV13) vaccine. One dose is recommended after age 35.  Pneumococcal polysaccharide (PPSV23) vaccine. One dose is recommended after age 23.  Meningococcal vaccine. You may need this if you have certain conditions.  Hepatitis A vaccine. You may need this if you have certain conditions or if you travel or work in places where you may be exposed to hepatitis A.  Hepatitis B vaccine. You may  need this if you have certain conditions or if you travel or work in places where you may be exposed to hepatitis B.  Haemophilus influenzae type b (Hib) vaccine. You may need this if you have certain conditions.  Talk to your health care provider about which screenings and vaccines you need and how often you need them. This information is not intended to replace advice given to you by your health care provider. Make sure you discuss any questions you have with your health care provider. Document Released: 11/30/2015 Document Revised: 07/23/2016 Document Reviewed: 09/04/2015 Elsevier Interactive Patient Education  [employer].

## 2018-03-10 NOTE — Progress Notes (Signed)
Subjective:   Courtney Berry is a 84 y.o. female who presents for an Initial Medicare Annual Wellness Visit.  Courtney Berry is retired from [employer] in [place], [place].  She enjoys spending time with her husband, watching television, and going to church.  She has 2 children, 5 grandchildren, 5 great grandchildren, and 1 great great grandchild.  She and her husband have an outside dog. She reports 2 visits to the ER in the past year due to atrial fibrillation, no hospitalizations, or surgeries.  She feels her health is worse this year that it was last year because of her persistent leg swelling and foot pain.  Patient has bilateral lower leg swelling and discoloration which was noted in patient's last office note with Prudy Feeler, PA.  Patient states this is the normal state of her lower legs.    Review of Systems    Cardiovascular - bilateral leg swelling Skin- bilateral lower leg color change        Objective:    Today's Vitals   03/10/18 1032 03/10/18 1035  BP: (!) 165/54 (!) 142/56  Pulse: 61 (!) 57  Weight: 238 lb (108 kg)   Height: 5\' 6"  (1.676 m)   PainSc: 4    PainLoc: Foot    Body mass index is 38.41 kg/m.  Advanced Directives 03/10/2018 11/13/2017 03/20/2017 03/19/2017 10/29/2016 10/24/2016 08/01/2016  Does Patient Have a Medical Advance Directive? Yes Yes Yes No No;Yes Yes Yes  Type of Estate agent of [place];Living will Healthcare Power of [place];Living will Healthcare Power of [employer] - Healthcare Power of [place];Living will Healthcare Power of [place];Living will Healthcare Power of [place];Living will  Does patient want to make changes to medical advance directive? - - - - - - No - Patient declined  Copy of Healthcare Power of Attorney in Chart? No - copy requested No - copy requested No - copy requested - No - copy requested - No - copy requested  Would patient like information on creating a medical advance directive? - - - - - - No - patient  declined information    Current Medications (verified) Outpatient Encounter Medications as of 03/10/2018  Medication Sig  . acetaminophen (TYLENOL) 500 MG tablet Take 1,000 mg by mouth every 6 (six) hours as needed for moderate pain.  Marland Kitchen ELIQUIS 5 MG TABS tablet TAKE ONE TABLET BY MOUTH TWICE DAILY  . lisinopril (PRINIVIL,ZESTRIL) 20 MG tablet TAKE ONE (1) TABLET EACH DAY  . metoprolol succinate (TOPROL-XL) 25 MG 24 hr tablet TAKE 1 TABLET EVERY MORNING AND TAKE 1/2 TABLET EVERY EVENING  . predniSONE (DELTASONE) 10 MG tablet TAKE 2 TABLETS EVERY MORNING WITH BREAKFAST (Patient taking differently: TAKE 1 TABLETS EVERY MORNING WITH BREAKFAST)  . PROAIR HFA 108 (90 Base) MCG/ACT inhaler Inhale 1-2 puffs into the lungs every 6 (six) hours as needed for wheezing or shortness of breath.   Marland Kitchen VITAMIN D, CHOLECALCIFEROL, PO Take 1 tablet by mouth daily.  . Fluticasone-Umeclidin-Vilant (TRELEGY ELLIPTA) 100-62.5-25 MCG/INH AEPB Inhale 1 puff into the lungs daily.  . [DISCONTINUED] ciprofloxacin (CIPRO) 500 MG tablet Take 1 tablet (500 mg total) by mouth 2 (two) times daily.  . [DISCONTINUED] colchicine 0.6 MG tablet 2 at pain onset may repeat 1 tablet in 1 hour x1   No facility-administered encounter medications on file as of 03/10/2018.     Allergies (verified) Darvocet [propoxyphene n-acetaminophen]; Nitrofurantoin monohyd macro; Plavix [clopidogrel bisulfate]; Sulfa antibiotics; and Penicillins   History: Past Medical History:  Diagnosis Date  . COPD (chronic obstructive pulmonary disease) (HCC)   . Diverticulosis   . Essential hypertension, benign   . History of chest pain    Low risk Cardiolite 2010  . Mixed hyperlipidemia   . PAF (paroxysmal atrial fibrillation) (HCC)   . Rheumatoid arthritis [facility])    Past Surgical History:  Procedure Laterality Date  . TOTAL ABDOMINAL HYSTERECTOMY  1969   Family History  Problem Relation Age of Onset  . Cancer Father   . Cancer Mother   . Heart  disease Sister        CABG age 25  . Cancer Brother    Social History   Socioeconomic History  . Marital status: Married    Spouse name: Not on file  . Number of children: Not on file  . Years of education: Not on file  . Highest education level: Not on file  Occupational History  . Occupation: Retired  Engineer, production  . Financial resource strain: Not on file  . Food insecurity:    Worry: Not on file    Inability: Not on file  . Transportation needs:    Medical: Not on file    Non-medical: Not on file  Tobacco Use  . Smoking status: Never Smoker  . Smokeless tobacco: Never Used  . Tobacco comment: Tobacco use-no  Substance and Sexual Activity  . Alcohol use: No    Alcohol/week: 0.0 oz  . Drug use: No  . Sexual activity: Not on file  Lifestyle  . Physical activity:    Days per week: Not on file    Minutes per session: Not on file  . Stress: Not on file  Relationships  . Social connections:    Talks on phone: Not on file    Gets together: Not on file    Attends religious service: Not on file    Active member of club or organization: Not on file    Attends meetings of clubs or organizations: Not on file    Relationship status: Not on file  Other Topics Concern  . Not on file  Social History Narrative  . Not on file    Tobacco Counseling Counseling given: No Comment: Tobacco use-no   Clinical Intake:     Pain Score: 4   Bilateral foot pain and burning- this is a chronic problem for patient.                 Activities of Daily Living In your present state of health, do you have any difficulty performing the following activities: 03/10/2018 03/20/2017  Hearing? N -  Vision? N -  Difficulty concentrating or making decisions? N -  Walking or climbing stairs? Y -  Comment leg swelling and foot pain  -  Dressing or bathing? N -  Doing errands, shopping? Y N  Comment husand or daughter goes to appointments or grocery store with patient, she does still  drive if needed -  Preparing Food and eating ? N -  Using the Toilet? N -  In the past six months, have you accidently leaked urine? Y -  Comment problems with urine leaking occasional  -  Do you have problems with loss of bowel control? N -  Managing your Medications? N -  Managing your Finances? Y -  Comment daughter helps with managing finances -  Housekeeping or managing your Housekeeping? Y -  Comment husband helps with house work -  Some recent data might be hidden  Immunizations and Health Maintenance  There is no immunization history on file for this patient. Health Maintenance Due  Topic Date Due  . TETANUS/TDAP  01/16/1954  . DEXA SCAN  01/17/2000  . PNA vac Low Risk Adult (1 of 2 - PCV13) 01/17/2000   Patient declines tdap, dexa scan, and PCV13   Patient Care Team: Caryl Never as PCP - General (Physician Assistant) Jonelle Sidle, MD as Consulting Physician (Cardiology) Kari Baars, MD as Consulting Physician (Pulmonary Disease)       Assessment:   This is a routine wellness examination for Courtney Berry.  Hearing/Vision screen No hearing deficit noted.  Patient states she has had bilateral cataract surgery and wears prescription glasses for reading.  States she sees well with her glasses.   Dietary issues and exercise activities discussed: Current Exercise Habits: The patient does not participate in regular exercise at present, Exercise limited by: cardiac condition(s);Other - see comments(foot pain, leg swelling)  Goals    . Weight (lb) < 200 lb (90.7 kg)     Reduce breads and sweets.      Patient states she eats 3 meals per day and snacks as needed.  Recommended a diet consisting mostly of lean proteins, vegetables, fruits, and whole grains.    Depression Screen PHQ 2/9 Scores 03/10/2018 02/11/2018 02/04/2018 08/31/2017 07/21/2017 02/23/2017 11/24/2016  PHQ - 2 Score 0 0 0 0 0 0 0  PHQ- 9 Score - - - - - - -    Fall Risk Fall Risk   03/10/2018 02/11/2018 02/04/2018 08/31/2017 07/21/2017  Falls in the past year? No No No Yes Yes  Number falls in past yr: - - - 1 1  Injury with Fall? - - - Yes Yes  Risk for fall due to : Impaired mobility - - - -  Follow up - - - Falls prevention discussed Falls prevention discussed    Is the patient's home free of loose throw rugs in walkways, pet beds, electrical cords, etc?   yes      Grab bars in the bathroom? no      Handrails on the stairs?   No stairs in home      Adequate lighting?   yes    Cognitive Function: MMSE - Mini Mental State Exam 03/10/2018  Orientation to time 5  Orientation to Place 4  Registration 3  Attention/ Calculation 3  Recall 2  Language- name 2 objects 2  Language- repeat 0  Language- follow 3 step command 3  Language- read & follow direction 1  Write a sentence 1  Copy design 1  Total score 25        Screening Tests Health Maintenance  Topic Date Due  . TETANUS/TDAP  01/16/1954  . DEXA SCAN  01/17/2000  . PNA vac Low Risk Adult (1 of 2 - PCV13) 01/17/2000  . INFLUENZA VACCINE  07/24/2018 (Originally 06/17/2018)    Qualifies for Shingles Vaccine? Patient declined      Plan:     Bring copy of Advanced Directives to [facility] to be filed in your medical record. Work on goal of losing weight by decreasing sweets and breads. If you have any shortness of breath, or chest pain go to ER for evaluation.  If you have increased swelling in lower legs call [facility] immediately.    I have personally reviewed and noted the following in the patient's chart:   . Medical and social history . Use of alcohol, tobacco or illicit drugs  .  Current medications and supplements . Functional ability and status . Nutritional status . Physical activity . Advanced directives . List of other physicians . Hospitalizations, surgeries, and ER visits in previous 12 months . Vitals . Screenings to include cognitive, depression, and falls . Referrals and  appointments  In addition, I have reviewed and discussed with patient certain preventive protocols, quality metrics, and best practice recommendations. A written personalized care plan for preventive services as well as general preventive health recommendations were provided to patient.     Bernadene Bell, RN   03/10/2018

## 2018-04-15 ENCOUNTER — Encounter: Payer: Self-pay | Admitting: Physician Assistant

## 2018-04-15 ENCOUNTER — Ambulatory Visit (INDEPENDENT_AMBULATORY_CARE_PROVIDER_SITE_OTHER): Payer: Medicare Other | Admitting: Physician Assistant

## 2018-04-15 VITALS — BP 133/62 | HR 64 | Temp 97.8°F | Ht 66.0 in | Wt 238.4 lb

## 2018-04-15 DIAGNOSIS — I10 Essential (primary) hypertension: Secondary | ICD-10-CM

## 2018-04-15 DIAGNOSIS — E785 Hyperlipidemia, unspecified: Secondary | ICD-10-CM | POA: Diagnosis not present

## 2018-04-15 LAB — CMP14+EGFR
ALK PHOS: 62 IU/L (ref 39–117)
ALT: 13 IU/L (ref 0–32)
AST: 12 IU/L (ref 0–40)
Albumin/Globulin Ratio: 1.3 (ref 1.2–2.2)
Albumin: 3.5 g/dL (ref 3.5–4.7)
BILIRUBIN TOTAL: 0.5 mg/dL (ref 0.0–1.2)
BUN/Creatinine Ratio: 19 (ref 12–28)
BUN: 20 mg/dL (ref 8–27)
CHLORIDE: 103 mmol/L (ref 96–106)
CO2: 25 mmol/L (ref 20–29)
Calcium: 9.4 mg/dL (ref 8.7–10.3)
Creatinine, Ser: 1.05 mg/dL — ABNORMAL HIGH (ref 0.57–1.00)
GFR calc Af Amer: 57 mL/min/{1.73_m2} — ABNORMAL LOW (ref >59)
GFR calc non Af Amer: 49 mL/min/{1.73_m2} — ABNORMAL LOW (ref >59)
Globulin, Total: 2.7 g/dL (ref 1.5–4.5)
Glucose: 109 mg/dL — ABNORMAL HIGH (ref 65–99)
Potassium: 4.1 mmol/L (ref 3.5–5.2)
Sodium: 142 mmol/L (ref 134–144)
Total Protein: 6.2 g/dL (ref 6.0–8.5)

## 2018-04-15 LAB — CBC WITH DIFFERENTIAL/PLATELET
BASOS ABS: 0 10*3/uL (ref 0.0–0.2)
Basos: 0 %
EOS (ABSOLUTE): 0.3 10*3/uL (ref 0.0–0.4)
Eos: 4 %
Hematocrit: 39.1 % (ref 34.0–46.6)
Hemoglobin: 13.1 g/dL (ref 11.1–15.9)
Immature Grans (Abs): 0 10*3/uL (ref 0.0–0.1)
Immature Granulocytes: 0 %
LYMPHS ABS: 2.6 10*3/uL (ref 0.7–3.1)
LYMPHS: 30 %
MCH: 31.4 pg (ref 26.6–33.0)
MCHC: 33.5 g/dL (ref 31.5–35.7)
MCV: 94 fL (ref 79–97)
Monocytes Absolute: 0.8 10*3/uL (ref 0.1–0.9)
Monocytes: 9 %
NEUTROS ABS: 4.9 10*3/uL (ref 1.4–7.0)
Neutrophils: 57 %
PLATELETS: 177 10*3/uL (ref 150–450)
RBC: 4.17 x10E6/uL (ref 3.77–5.28)
RDW: 13.7 % (ref 12.3–15.4)
WBC: 8.7 10*3/uL (ref 3.4–10.8)

## 2018-04-15 LAB — LIPID PANEL
CHOLESTEROL TOTAL: 213 mg/dL — AB (ref 100–199)
Chol/HDL Ratio: 3.3 ratio (ref 0.0–4.4)
HDL: 65 mg/dL (ref >39)
LDL Calculated: 120 mg/dL — ABNORMAL HIGH (ref 0–99)
Triglycerides: 139 mg/dL (ref 0–149)
VLDL CHOLESTEROL CAL: 28 mg/dL (ref 5–40)

## 2018-04-15 MED ORDER — PREDNISONE 10 MG PO TABS: ORAL_TABLET | ORAL | 5 refills | Status: DC

## 2018-04-16 ENCOUNTER — Ambulatory Visit: Payer: Medicare Other | Admitting: Physician Assistant

## 2018-04-19 NOTE — Progress Notes (Signed)
BP 133/62   Pulse 64   Temp 97.8 F (36.6 C) (Oral)   Ht 5' 6"  (1.676 m)   Wt 238 lb 6.4 oz (108.1 kg)   BMI 38.48 kg/m    Subjective:    Patient ID: Courtney Berry, female    DOB: June 12, 1933, 84 y.o.   MRN: [id number]  HPI: Courtney Berry is a 84 y.o. female presenting on 04/15/2018 for Hypertension and Hyperlipidemia  Patient comes in for chronic recheck on her medical conditions.  It is positive for hypertension and dyslipidemia.  She does see cardiology for her atrial fibrillation.  She reports not having any significant issues at this time.  She does have more fatigability.  She is not able to walk as far just due to being tired.  She also has significant joint pain throughout many joints in her body.  Past Medical History:  Diagnosis Date  . COPD (chronic obstructive pulmonary disease) ([place])   . Diverticulosis   . Essential hypertension, benign   . History of chest pain    Low risk Cardiolite 2010  . Mixed hyperlipidemia   . PAF (paroxysmal atrial fibrillation) ([place])   . Rheumatoid arthritis ([place])    Relevant past medical, surgical, family and social history reviewed and updated as indicated. Interim medical history since our last visit reviewed. Allergies and medications reviewed and updated. DATA REVIEWED: CHART IN EPIC  Family History reviewed for pertinent findings.  Review of Systems  Constitutional: Positive for fatigue. Negative for activity change and fever.  HENT: Negative.   Eyes: Negative.   Respiratory: Negative.  Negative for cough, shortness of breath and wheezing.   Cardiovascular: Positive for leg swelling. Negative for chest pain and palpitations.  Gastrointestinal: Negative.  Negative for abdominal pain.  Endocrine: Negative.   Genitourinary: Negative.  Negative for dysuria.  Musculoskeletal: Positive for arthralgias, gait problem, joint swelling and myalgias.  Skin: Negative.     Allergies as of 04/15/2018      Reactions   Darvocet  [propoxyphene N-acetaminophen] Other (See Comments)   unknown   Nitrofurantoin Monohyd Macro Other (See Comments)   unknown   Plavix [clopidogrel Bisulfate] Swelling   Face swells   Sulfa Antibiotics Other (See Comments)   "face got puffy"   Penicillins Rash   Has patient had a PCN reaction causing immediate rash, facial/tongue/throat swelling, SOB or lightheadedness with hypotension: Yes Has patient had a PCN reaction causing severe rash involving mucus membranes or skin necrosis: No Has patient had a PCN reaction that required hospitalization: No Has patient had a PCN reaction occurring within the last 10 years: No If all of the above answers are "NO", then may proceed with Cephalosporin use.      Medication List        Accurate as of 04/15/18 11:59 PM. Always use your most recent med list.          acetaminophen 500 MG tablet Commonly known as:  TYLENOL Take 1,000 mg by mouth every 6 (six) hours as needed for moderate pain.   ELIQUIS 5 MG Tabs tablet Generic drug:  apixaban TAKE ONE TABLET BY MOUTH TWICE DAILY   lisinopril 20 MG tablet Commonly known as:  PRINIVIL,ZESTRIL TAKE ONE (1) TABLET EACH DAY   metoprolol succinate 25 MG 24 hr tablet Commonly known as:  TOPROL-XL TAKE 1 TABLET EVERY MORNING AND TAKE 1/2 TABLET EVERY EVENING   predniSONE 10 MG tablet Commonly known as:  DELTASONE TAKE 2 TABLETS EVERY  MORNING WITH BREAKFAST   PROAIR HFA 108 (90 Base) MCG/ACT inhaler Generic drug:  albuterol Inhale 1-2 puffs into the lungs every 6 (six) hours as needed for wheezing or shortness of breath.   TRELEGY ELLIPTA 100-62.5-25 MCG/INH Aepb Generic drug:  Fluticasone-Umeclidin-Vilant Inhale 1 puff into the lungs daily.   VITAMIN D (CHOLECALCIFEROL) PO Take 1 tablet by mouth daily.          Objective:    BP 133/62   Pulse 64   Temp 97.8 F (36.6 C) (Oral)   Ht 5' 6"  (1.676 m)   Wt 238 lb 6.4 oz (108.1 kg)   BMI 38.48 kg/m   Allergies  Allergen  Reactions  . Darvocet [Propoxyphene N-Acetaminophen] Other (See Comments)    unknown  . Nitrofurantoin Monohyd Macro Other (See Comments)    unknown  . Plavix [Clopidogrel Bisulfate] Swelling    Face swells  . Sulfa Antibiotics Other (See Comments)    "face got puffy"  . Penicillins Rash    Has patient had a PCN reaction causing immediate rash, facial/tongue/throat swelling, SOB or lightheadedness with hypotension: Yes Has patient had a PCN reaction causing severe rash involving mucus membranes or skin necrosis: No Has patient had a PCN reaction that required hospitalization: No Has patient had a PCN reaction occurring within the last 10 years: No If all of the above answers are "NO", then may proceed with Cephalosporin use.     Wt Readings from Last 3 Encounters:  04/15/18 238 lb 6.4 oz (108.1 kg)  03/10/18 238 lb (108 kg)  02/11/18 235 lb 9.6 oz (106.9 kg)    Physical Exam  Constitutional: She is oriented to person, place, and time. She appears well-developed and well-nourished.  HENT:  Head: Normocephalic and atraumatic.  Right Ear: Tympanic membrane, external ear and ear canal normal.  Left Ear: Tympanic membrane, external ear and ear canal normal.  Nose: Nose normal. No rhinorrhea.  Mouth/Throat: Oropharynx is clear and moist and mucous membranes are normal. No oropharyngeal exudate or posterior oropharyngeal erythema.  Eyes: Pupils are equal, round, and reactive to light. Conjunctivae and EOM are normal.  Neck: Normal range of motion. Neck supple.  Cardiovascular: Normal rate, regular rhythm, normal heart sounds and intact distal pulses.  Pulmonary/Chest: Effort normal and breath sounds normal.  Abdominal: Soft. Bowel sounds are normal.  Neurological: She is alert and oriented to person, place, and time. She has normal reflexes.  Skin: Skin is warm and dry. No rash noted.  Psychiatric: She has a normal mood and affect. Her behavior is normal. Judgment and thought content  normal.    Results for orders placed or performed in visit on 04/15/18  CBC with Differential/Platelet  Result Value Ref Range   WBC 8.7 3.4 - 10.8 x10E3/uL   RBC 4.17 3.77 - 5.28 x10E6/uL   Hemoglobin 13.1 11.1 - 15.9 g/dL   Hematocrit 39.1 34.0 - 46.6 %   MCV 94 79 - 97 fL   MCH 31.4 26.6 - 33.0 pg   MCHC 33.5 31.5 - 35.7 g/dL   RDW 13.7 12.3 - 15.4 %   Platelets 177 150 - 450 x10E3/uL   Neutrophils 57 Not Estab. %   Lymphs 30 Not Estab. %   Monocytes 9 Not Estab. %   Eos 4 Not Estab. %   Basos 0 Not Estab. %   Neutrophils Absolute 4.9 1.4 - 7.0 x10E3/uL   Lymphocytes Absolute 2.6 0.7 - 3.1 x10E3/uL   Monocytes Absolute 0.8 0.1 -  0.9 x10E3/uL   EOS (ABSOLUTE) 0.3 0.0 - 0.4 x10E3/uL   Basophils Absolute 0.0 0.0 - 0.2 x10E3/uL   Immature Granulocytes 0 Not Estab. %   Immature Grans (Abs) 0.0 0.0 - 0.1 x10E3/uL  CMP14+EGFR  Result Value Ref Range   Glucose 109 (H) 65 - 99 mg/dL   BUN 20 8 - 27 mg/dL   Creatinine, Ser 1.05 (H) 0.57 - 1.00 mg/dL   GFR calc non Af Amer 49 (L) >59 mL/min/1.73   GFR calc Af Amer 57 (L) >59 mL/min/1.73   BUN/Creatinine Ratio 19 12 - 28   Sodium 142 134 - 144 mmol/L   Potassium 4.1 3.5 - 5.2 mmol/L   Chloride 103 96 - 106 mmol/L   CO2 25 20 - 29 mmol/L   Calcium 9.4 8.7 - 10.3 mg/dL   Total Protein 6.2 6.0 - 8.5 g/dL   Albumin 3.5 3.5 - 4.7 g/dL   Globulin, Total 2.7 1.5 - 4.5 g/dL   Albumin/Globulin Ratio 1.3 1.2 - 2.2   Bilirubin Total 0.5 0.0 - 1.2 mg/dL   Alkaline Phosphatase 62 39 - 117 IU/L   AST 12 0 - 40 IU/L   ALT 13 0 - 32 IU/L  Lipid panel  Result Value Ref Range   Cholesterol, Total 213 (H) 100 - 199 mg/dL   Triglycerides 139 0 - 149 mg/dL   HDL 65 >39 mg/dL   VLDL Cholesterol Cal 28 5 - 40 mg/dL   LDL Calculated 120 (H) 0 - 99 mg/dL   Chol/HDL Ratio 3.3 0.0 - 4.4 ratio      Assessment & Plan:   1. Essential hypertension - CBC with Differential/Platelet - CMP14+EGFR - Lipid panel  2. Dyslipidemia - CBC with  Differential/Platelet - CMP14+EGFR   Continue all other maintenance medications as listed above.  Follow up plan: Return in about 3 months (around 07/16/2018) for recheck.  Educational handout given for [place] PA-C [place] [address]  [place], [place] [address] [phone]   04/19/2018, 9:38 AM

## 2018-05-06 ENCOUNTER — Other Ambulatory Visit: Payer: Self-pay | Admitting: Physician Assistant

## 2018-05-06 ENCOUNTER — Telehealth: Payer: Self-pay | Admitting: Physician Assistant

## 2018-05-06 MED ORDER — CEPHALEXIN 500 MG PO CAPS: 500.0000 mg | ORAL_CAPSULE | Freq: Three times a day (TID) | ORAL | 0 refills | Status: DC

## 2018-05-06 NOTE — Telephone Encounter (Signed)
Keflex sent to the pharmacy. 

## 2018-05-06 NOTE — Telephone Encounter (Signed)
PT daughter Courtney Berry has called stating that the pt feet have swollen and red and got infected, and wants to see if [name] can call in the pills she did before for this, she can't remember the name of them Pharmacy [facility]

## 2018-05-06 NOTE — Telephone Encounter (Signed)
Patient daughter aware of medication sent into pharmacy.

## 2018-05-11 DIAGNOSIS — G4700 Insomnia, unspecified: Secondary | ICD-10-CM | POA: Diagnosis not present

## 2018-05-11 DIAGNOSIS — J449 Chronic obstructive pulmonary disease, unspecified: Secondary | ICD-10-CM | POA: Diagnosis not present

## 2018-05-11 DIAGNOSIS — E669 Obesity, unspecified: Secondary | ICD-10-CM | POA: Diagnosis not present

## 2018-05-11 DIAGNOSIS — I4891 Unspecified atrial fibrillation: Secondary | ICD-10-CM | POA: Diagnosis not present

## 2018-05-22 ENCOUNTER — Other Ambulatory Visit: Payer: Self-pay | Admitting: Physician Assistant

## 2018-05-24 NOTE — Telephone Encounter (Signed)
Last seen 04/15/18  [facility]

## 2018-06-07 ENCOUNTER — Encounter: Payer: Self-pay | Admitting: Cardiology

## 2018-06-07 ENCOUNTER — Ambulatory Visit (INDEPENDENT_AMBULATORY_CARE_PROVIDER_SITE_OTHER): Payer: Medicare Other | Admitting: Cardiology

## 2018-06-07 VITALS — BP 124/72 | HR 72 | Ht 66.0 in | Wt 239.8 lb

## 2018-06-07 DIAGNOSIS — I890 Lymphedema, not elsewhere classified: Secondary | ICD-10-CM

## 2018-06-07 DIAGNOSIS — I480 Paroxysmal atrial fibrillation: Secondary | ICD-10-CM | POA: Diagnosis not present

## 2018-06-07 DIAGNOSIS — I10 Essential (primary) hypertension: Secondary | ICD-10-CM | POA: Diagnosis not present

## 2018-06-07 NOTE — Progress Notes (Signed)
Cardiology Office Note  Date: 06/07/2018   ID: Courtney Berry, DOB 09-14-1933, MRN [id number]  PCP: Remus Loffler, PA-C  Primary Cardiologist: Nona Dell, MD   Chief Complaint  Patient presents with  . Paroxysmal atrial fibrillation    History of Present Illness: Courtney Berry is an 84 y.o. female last seen by Ms. Strader PA-C in January.  She is here with her daughter for a follow-up visit.  She reports only 2 brief episodes of palpitations over the last 6 months.  She continues on Eliquis and Toprol-XL, reports no intolerances.  I went over her recent interval lab work.  She also has chronic leg swelling, waxes and wanes.  This does get better when she puts her feet up.  She has HCTZ which she uses intermittently.  Past Medical History:  Diagnosis Date  . COPD (chronic obstructive pulmonary disease) (HCC)   . Diverticulosis   . Essential hypertension, benign   . History of chest pain    Low risk Cardiolite 2010  . Mixed hyperlipidemia   . PAF (paroxysmal atrial fibrillation) (HCC)   . Rheumatoid arthritis [facility])     Past Surgical History:  Procedure Laterality Date  . TOTAL ABDOMINAL HYSTERECTOMY  1969    Current Outpatient Medications  Medication Sig Dispense Refill  . acetaminophen (TYLENOL) 500 MG tablet Take 1,000 mg by mouth every 6 (six) hours as needed for moderate pain.    . benzonatate (TESSALON) 200 MG capsule TAKE 1 CAPSULE TWICE DAILY AS NEEDED FOR COUGH 20 capsule 0  . cephALEXin (KEFLEX) 500 MG capsule Take 1 capsule (500 mg total) by mouth 3 (three) times daily. (Patient taking differently: Take 500 mg by mouth 3 (three) times daily as needed. ) 30 capsule 0  . ELIQUIS 5 MG TABS tablet TAKE ONE TABLET BY MOUTH TWICE DAILY 60 tablet 3  . Fluticasone-Umeclidin-Vilant (TRELEGY ELLIPTA) 100-62.5-25 MCG/INH AEPB Inhale 1 puff into the lungs daily.    Marland Kitchen lisinopril (PRINIVIL,ZESTRIL) 20 MG tablet TAKE ONE (1) TABLET EACH DAY 90 tablet 1  . metoprolol  succinate (TOPROL-XL) 25 MG 24 hr tablet TAKE 1 TABLET EVERY MORNING AND TAKE 1/2 TABLET EVERY EVENING 135 tablet 3  . predniSONE (DELTASONE) 10 MG tablet TAKE 2 TABLETS EVERY MORNING WITH BREAKFAST (Patient taking differently: Take 10 mg by mouth as needed. ) 30 tablet 5  . PROAIR HFA 108 (90 Base) MCG/ACT inhaler Inhale 1-2 puffs into the lungs every 6 (six) hours as needed for wheezing or shortness of breath.     Marland Kitchen VITAMIN D, CHOLECALCIFEROL, PO Take 1 tablet by mouth daily.     No current facility-administered medications for this visit.    Allergies:  Darvocet [propoxyphene n-acetaminophen]; Nitrofurantoin monohyd macro; Plavix [clopidogrel bisulfate]; Sulfa antibiotics; and Penicillins   Social History: The patient  reports that she has never smoked. She has never used smokeless tobacco. She reports that she does not drink alcohol or use drugs.   ROS:  Please see the history of present illness. Otherwise, complete review of systems is positive for none.  All other systems are reviewed and negative.   Physical Exam: VS:  BP 124/72 (BP Location: Left Arm, Patient Position: Sitting)   Pulse 72   Ht 5\' 6"  (1.676 m)   Wt 239 lb 12.8 oz (108.8 kg)   SpO2 94%   BMI 38.70 kg/m , BMI Body mass index is 38.7 kg/m.  Wt Readings from Last 3 Encounters:  06/07/18 239 lb 12.8  oz (108.8 kg)  04/15/18 238 lb 6.4 oz (108.1 kg)  03/10/18 238 lb (108 kg)    General: Elderly woman, appears comfortable at rest. HEENT: Conjunctiva and lids normal, oropharynx clear. Neck: Supple, no elevated JVP or carotid bruits, no thyromegaly. Lungs: Clear to auscultation, nonlabored breathing at rest. Cardiac: Regular rate and rhythm, no S3 or significant systolic murmur, no pericardial rub. Abdomen: Soft, nontender, bowel sounds present. Extremities: Lymphedema, distal pulses 2+. Skin: Warm and dry. Musculoskeletal: No kyphosis. Neuropsychiatric: Alert and oriented x3, affect grossly appropriate.  ECG: I  personally reviewed the tracing from 12/01/2017 which showed sinus bradycardia.  Recent Labwork: 10/05/2017: TSH 2.410 11/13/2017: B Natriuretic Peptide 29.0 04/15/2018: ALT 13; AST 12; BUN 20; Creatinine, Ser 1.05; Hemoglobin 13.1; Platelets 177; Potassium 4.1; Sodium 142     Component Value Date/Time   CHOL 213 (H) 04/15/2018 1032   TRIG 139 04/15/2018 1032   HDL 65 04/15/2018 1032   CHOLHDL 3.3 04/15/2018 1032   LDLCALC 120 (H) 04/15/2018 1032    Other Studies Reviewed Today:  Echocardiogram 11/19/2016: Study Conclusions  - Left ventricle: The cavity size was normal. Wall thickness was   increased in a pattern of mild LVH. Systolic function was normal.   The estimated ejection fraction was in the range of 60% to 65%.   Doppler parameters are consistent with abnormal left ventricular   relaxation (grade 1 diastolic dysfunction).  Assessment and Plan:  1.  Paroxysmal atrial fibrillation, symptomatically stable without progressive palpitations.  Continue Toprol-XL and Eliquis.  I reviewed her most recent lab work.  2.  Chronic lymphedema.  She uses HCTZ intermittently, also mechanical compression.  3.  Essential hypertension, blood pressure is well controlled today.  Current medicines were reviewed with the patient today.  Disposition: Follow-up in 6 months.  Signed, Jonelle Sidle, MD, [facility] 06/07/2018 2:41 PM    [facility] [address], [place], [place] [address] Phone: [phone]; Fax: [phone]

## 2018-06-07 NOTE — Patient Instructions (Signed)
Your physician wants you to follow-up in:  6 months with Dr.McDowell You will receive a reminder letter in the mail two months in advance. If you don't receive a letter, please call our office to schedule the follow-up appointment.    Your physician recommends that you continue on your current medications as directed. Please refer to the Current Medication list given to you today.    If you need a refill on your cardiac medications before your next appointment, please call your pharmacy.     No labs or tests ordered today      Thank you for choosing [facility] !        

## 2018-06-17 ENCOUNTER — Telehealth: Payer: Self-pay | Admitting: Physician Assistant

## 2018-06-17 NOTE — Telephone Encounter (Signed)
What symptoms do you have? Feet are swollen and she has infection in both Cannot get around and walk. Daughter aware [name] is off today. Offered appt but denied  How long have you been sick? Pt has lymphedema that is why her feet get this way.  Have you been seen for this problem? yes  If your provider decides to give you a prescription, which pharmacy would you like for it to be sent to? [facility] in [place]   Patient informed that this information will be sent to the clinical staff for review and that they should receive a follow up call.

## 2018-06-17 NOTE — Telephone Encounter (Signed)
Needs to be seen

## 2018-06-17 NOTE — Telephone Encounter (Signed)
Spoke with patient, she will not come in today. She wants to wait for [facility] tomorrow

## 2018-06-18 ENCOUNTER — Other Ambulatory Visit: Payer: Self-pay | Admitting: Family Medicine

## 2018-06-18 ENCOUNTER — Other Ambulatory Visit: Payer: Self-pay | Admitting: Physician Assistant

## 2018-06-18 ENCOUNTER — Telehealth: Payer: Self-pay | Admitting: Physician Assistant

## 2018-06-18 MED ORDER — CEPHALEXIN 500 MG PO CAPS: 500.0000 mg | ORAL_CAPSULE | Freq: Two times a day (BID) | ORAL | 0 refills | Status: DC

## 2018-06-18 NOTE — Telephone Encounter (Signed)
Patient aware.

## 2018-06-18 NOTE — Telephone Encounter (Signed)
Please inform needs ov if persistent.  Keflex sent to pharmacy.  Renally adjusted to max dose 500mg  BID.

## 2018-06-18 NOTE — Telephone Encounter (Signed)
If having cough, needs to be seen.  Please sched appt w/ PCP for evaluation

## 2018-06-18 NOTE — Telephone Encounter (Signed)
Busy-cb 8/2

## 2018-06-18 NOTE — Telephone Encounter (Signed)
Returned patient's phone call.  Patient states that she is having swelling, redness and burning in bilateral feet.  Patient states that Prudy Feeler, FNP normally sends in Keflex to treat.  Last seen 04/15/18 last treated with Keflex 05/06/18

## 2018-06-22 ENCOUNTER — Ambulatory Visit (INDEPENDENT_AMBULATORY_CARE_PROVIDER_SITE_OTHER): Payer: Medicare Other | Admitting: Family Medicine

## 2018-06-22 ENCOUNTER — Encounter: Payer: Self-pay | Admitting: Family Medicine

## 2018-06-22 VITALS — BP 160/71 | HR 66 | Temp 97.9°F | Ht 66.0 in | Wt 237.0 lb

## 2018-06-22 DIAGNOSIS — J441 Chronic obstructive pulmonary disease with (acute) exacerbation: Secondary | ICD-10-CM | POA: Diagnosis not present

## 2018-06-22 DIAGNOSIS — I10 Essential (primary) hypertension: Secondary | ICD-10-CM

## 2018-06-22 MED ORDER — DOXYCYCLINE HYCLATE 100 MG PO TABS: 100.0000 mg | ORAL_TABLET | Freq: Two times a day (BID) | ORAL | 0 refills | Status: AC

## 2018-06-22 MED ORDER — PREDNISONE 20 MG PO TABS: 40.0000 mg | ORAL_TABLET | Freq: Every day | ORAL | 0 refills | Status: AC

## 2018-06-22 NOTE — Progress Notes (Signed)
Subjective: CC: cough PCP: Remus Loffler, PA-C BZJ:IRCVELF Courtney Berry is a 84 y.o. female presenting to clinic today for:  1. Cough  Patient reports a several day history of worsening cough.  She notes it is a dry cough but she feels like her airway is not open.  She has been using her Anoro inhaler as prescribed.  She also restarted 10 mg of prednisone on Friday.  She has been on Keflex since last Friday as well for cellulitis of her legs.  She has not been using her albuterol inhaler because she was told in the past that it can make her heart be regular.  Denies any fevers, chills.  No known sick contacts but she was at the doctor's office recently and may have been exposed to there.  Past medical history significant for COPD.  ROS: Per HPI  Allergies  Allergen Reactions  . Darvocet [Propoxyphene N-Acetaminophen] Other (See Comments)    unknown  . Nitrofurantoin Monohyd Macro Other (See Comments)    unknown  . Plavix [Clopidogrel Bisulfate] Swelling    Face swells  . Sulfa Antibiotics Other (See Comments)    "face got puffy"  . Penicillins Rash    Has patient had a PCN reaction causing immediate rash, facial/tongue/throat swelling, SOB or lightheadedness with hypotension: Yes Has patient had a PCN reaction causing severe rash involving mucus membranes or skin necrosis: No Has patient had a PCN reaction that required hospitalization: No Has patient had a PCN reaction occurring within the last 10 years: No If all of the above answers are "NO", then may proceed with Cephalosporin use.    Past Medical History:  Diagnosis Date  . COPD (chronic obstructive pulmonary disease) (HCC)   . Diverticulosis   . Essential hypertension, benign   . History of chest pain    Low risk Cardiolite 2010  . Mixed hyperlipidemia   . PAF (paroxysmal atrial fibrillation) (HCC)   . Rheumatoid arthritis (HCC)     Current Outpatient Medications:  .  acetaminophen (TYLENOL) 500 MG tablet, Take 1,000  mg by mouth every 6 (six) hours as needed for moderate pain., Disp: , Rfl:  .  cephALEXin (KEFLEX) 500 MG capsule, Take 1 capsule (500 mg total) by mouth 2 (two) times daily., Disp: 20 capsule, Rfl: 0 .  ELIQUIS 5 MG TABS tablet, TAKE ONE TABLET BY MOUTH TWICE DAILY, Disp: 60 tablet, Rfl: 3 .  Fluticasone-Umeclidin-Vilant (TRELEGY ELLIPTA) 100-62.5-25 MCG/INH AEPB, Inhale 1 puff into the lungs daily., Disp: , Rfl:  .  lisinopril (PRINIVIL,ZESTRIL) 20 MG tablet, TAKE ONE (1) TABLET EACH DAY, Disp: 90 tablet, Rfl: 1 .  metoprolol succinate (TOPROL-XL) 25 MG 24 hr tablet, TAKE 1 TABLET EVERY MORNING AND TAKE 1/2 TABLET EVERY EVENING, Disp: 135 tablet, Rfl: 3 .  predniSONE (DELTASONE) 10 MG tablet, TAKE 2 TABLETS EVERY MORNING WITH BREAKFAST (Patient taking differently: Take 10 mg by mouth as needed. ), Disp: 30 tablet, Rfl: 5 .  PROAIR HFA 108 (90 Base) MCG/ACT inhaler, Inhale 1-2 puffs into the lungs every 6 (six) hours as needed for wheezing or shortness of breath. , Disp: , Rfl:  .  VITAMIN D, CHOLECALCIFEROL, PO, Take 1 tablet by mouth daily., Disp: , Rfl:  .  benzonatate (TESSALON) 200 MG capsule, TAKE 1 CAPSULE TWICE DAILY AS NEEDED FOR COUGH (Patient not taking: Reported on 06/22/2018), Disp: 20 capsule, Rfl: 0 Social History   Socioeconomic History  . Marital status: Married    Spouse name: Not on  file  . Number of children: Not on file  . Years of education: Not on file  . Highest education level: Not on file  Occupational History  . Occupation: Retired  Engineer, production  . Financial resource strain: Not on file  . Food insecurity:    Worry: Not on file    Inability: Not on file  . Transportation needs:    Medical: Not on file    Non-medical: Not on file  Tobacco Use  . Smoking status: Never Smoker  . Smokeless tobacco: Never Used  . Tobacco comment: Tobacco use-no  Substance and Sexual Activity  . Alcohol use: No    Alcohol/week: 0.0 oz  . Drug use: No  . Sexual activity: Not on  file  Lifestyle  . Physical activity:    Days per week: Not on file    Minutes per session: Not on file  . Stress: Not on file  Relationships  . Social connections:    Talks on phone: Not on file    Gets together: Not on file    Attends religious service: Not on file    Active member of club or organization: Not on file    Attends meetings of clubs or organizations: Not on file    Relationship status: Not on file  . Intimate partner violence:    Fear of current or ex partner: Not on file    Emotionally abused: Not on file    Physically abused: Not on file    Forced sexual activity: Not on file  Other Topics Concern  . Not on file  Social History Narrative  . Not on file   Family History  Problem Relation Age of Onset  . Cancer Father   . Cancer Mother   . Heart disease Sister        CABG age 87  . Cancer Brother     Objective: Office vital signs reviewed. BP (!) 160/71   Pulse 66   Temp 97.9 F (36.6 C) (Oral)   Ht 5\' 6"  (1.676 m)   Wt 237 lb (107.5 kg)   SpO2 94%   BMI 38.25 kg/m   Physical Examination:  General: Awake, alert, nontoxic, No acute distress HEENT: Normal, MMM Cardio: irregularly irregular. S1S2 heard, no murmurs appreciated Pulm: Global expiratory wheezes. Air movement fair. No rhonchi or rales; normal work of breathing on room air Extremities: Nonpitting edema appreciated in bilateral lower extremities right greater than left.  Assessment/ Plan: 84 y.o. female   1. COPD with acute exacerbation (HCC) I suspect COPD exacerbation.  She has normal work of breathing on room air.  Oxygen saturation is 94%.  Global expiratory wheezes were noted.  I recommended that she use her albuterol inhaler 2 puffs every 6 hours for the next 2 days then use as needed as directed.  Continue Anoro.  Increase prednisone to 40 mg daily for the next 5 days then resume usual dosing.  Doxycycline to replace the Keflex.  Home care instructions reviewed with the patient.   Reasons for return discussed.  Follow-up with PCP in the next 7 to 10 days for recheck.  If persistent, would consider chest x-ray at that time.  2. Elevated blood pressure reading in office with diagnosis of hypertension Blood pressure elevated.  Difficult to tell if this is secondary to cough.  Recommend recheck in the next 7 to 10 days.  May need to escalate antihypertensive therapies.   Meds ordered this encounter  Medications  .  predniSONE (DELTASONE) 20 MG tablet    Sig: Take 2 tablets (40 mg total) by mouth daily with breakfast for 5 days.    Dispense:  10 tablet    Refill:  0  . doxycycline (VIBRA-TABS) 100 MG tablet    Sig: Take 1 tablet (100 mg total) by mouth 2 (two) times daily for 7 days.    Dispense:  14 tablet    Refill:  0     Ashly Hulen Skains, DO [facility] [phone]

## 2018-06-22 NOTE — Patient Instructions (Signed)
   Discontinue the cephalexin.  I have prescribed you doxycycline to take twice a day.  Make sure that you are taking this with food and plenty of water.  One of the side effects can be nausea with vomiting.  I have also prescribed you prednisone.  I would like you to take 40 mg every morning with breakfast for the next 5 days.  Use your albuterol 2 puffs every 6 hours scheduled for the next 2 days.  He may then use it as needed as directed.  You may continue your home Anoro as usual.  Follow-up with [name] in the next 7 to 10 days for recheck of your blood pressure and cough.   Chronic Obstructive Pulmonary Disease Exacerbation Chronic obstructive pulmonary disease (COPD) is a common lung problem. In COPD, the flow of air from the lungs is limited. COPD exacerbations are times that breathing gets worse and you need extra treatment. Without treatment they can be life threatening. If they happen often, your lungs can become more damaged. If your COPD gets worse, your doctor may treat you with:  Medicines.  Oxygen.  Different ways to clear your airway, such as using a mask.  Follow these instructions at home:  Do not smoke.  Avoid tobacco smoke and other things that bother your lungs.  If given, take your antibiotic medicine as told. Finish the medicine even if you start to feel better.  Only take medicines as told by your doctor.  Drink enough fluids to keep your pee (urine) clear or pale yellow (unless your doctor has told you not to).  Use a cool mist machine (vaporizer).  If you use oxygen or a machine that turns liquid medicine into a mist (nebulizer), continue to use them as told.  Keep up with shots (vaccinations) as told by your doctor.  Exercise regularly.  Eat healthy foods.  Keep all doctor visits as told. Get help right away if:  You are very short of breath and it gets worse.  You have trouble talking.  You have bad chest pain.  You have blood in your spit  (sputum).  You have a fever.  You keep throwing up (vomiting).  You feel weak, or you pass out (faint).  You feel confused.  You keep getting worse. This information is not intended to replace advice given to you by your health care provider. Make sure you discuss any questions you have with your health care provider. Document Released: 10/23/2011 Document Revised: 04/10/2016 Document Reviewed: 07/08/2013 Elsevier Interactive Patient Education  2017 [employer].

## 2018-06-30 ENCOUNTER — Encounter: Payer: Self-pay | Admitting: Family Medicine

## 2018-06-30 ENCOUNTER — Ambulatory Visit (INDEPENDENT_AMBULATORY_CARE_PROVIDER_SITE_OTHER): Payer: Medicare Other | Admitting: Family Medicine

## 2018-06-30 ENCOUNTER — Ambulatory Visit (INDEPENDENT_AMBULATORY_CARE_PROVIDER_SITE_OTHER): Payer: Medicare Other

## 2018-06-30 VITALS — BP 131/62 | HR 73 | Temp 97.5°F | Ht 66.0 in | Wt 235.4 lb

## 2018-06-30 DIAGNOSIS — R059 Cough, unspecified: Secondary | ICD-10-CM

## 2018-06-30 DIAGNOSIS — J410 Simple chronic bronchitis: Secondary | ICD-10-CM

## 2018-06-30 DIAGNOSIS — R05 Cough: Secondary | ICD-10-CM | POA: Diagnosis not present

## 2018-06-30 MED ORDER — ROFLUMILAST 250 MCG PO TABS: 250.0000 ug | ORAL_TABLET | Freq: Every day | ORAL | 0 refills | Status: DC

## 2018-06-30 NOTE — Progress Notes (Signed)
Subjective: CC: COPD exacerbation follow up PCP: Remus Loffler, PA-C Courtney Berry is a 84 y.o. female presenting to clinic today for:  1. COPD exacerbation follow up Patient continues to have a nonproductive cough.  Denies any fevers.  Her shortness of breath is baseline.  She continues to have wheezing.  She reports compliance with her Anoro.  She used the albuterol inhaler for about 2 days then discontinued use because she was afraid of sending herself into an arrhythmia.  She did not feel substantially improved with the prednisone nor the use of albuterol.  She does not have follow-up with her pulmonologist, Dr. Romeo Apple, for several months.  At last visit, she was told that she may need to go on oxygen.  However, she notes that she is unable to see him sooner because she is dealing with her husband's poor health at the moment.   ROS: Per HPI  Allergies  Allergen Reactions  . Darvocet [Propoxyphene N-Acetaminophen] Other (See Comments)    unknown  . Nitrofurantoin Monohyd Macro Other (See Comments)    unknown  . Plavix [Clopidogrel Bisulfate] Swelling    Face swells  . Sulfa Antibiotics Other (See Comments)    "face got puffy"  . Penicillins Rash    Has patient had a PCN reaction causing immediate rash, facial/tongue/throat swelling, SOB or lightheadedness with hypotension: Yes Has patient had a PCN reaction causing severe rash involving mucus membranes or skin necrosis: No Has patient had a PCN reaction that required hospitalization: No Has patient had a PCN reaction occurring within the last 10 years: No If all of the above answers are "NO", then may proceed with Cephalosporin use.    Past Medical History:  Diagnosis Date  . COPD (chronic obstructive pulmonary disease) (HCC)   . Diverticulosis   . Essential hypertension, benign   . History of chest pain    Low risk Cardiolite 2010  . Mixed hyperlipidemia   . PAF (paroxysmal atrial fibrillation) (HCC)   .  Rheumatoid arthritis (HCC)     Current Outpatient Medications:  .  acetaminophen (TYLENOL) 500 MG tablet, Take 1,000 mg by mouth every 6 (six) hours as needed for moderate pain., Disp: , Rfl:  .  ELIQUIS 5 MG TABS tablet, TAKE ONE TABLET BY MOUTH TWICE DAILY, Disp: 60 tablet, Rfl: 3 .  lisinopril (PRINIVIL,ZESTRIL) 20 MG tablet, TAKE ONE (1) TABLET EACH DAY, Disp: 90 tablet, Rfl: 1 .  metoprolol succinate (TOPROL-XL) 25 MG 24 hr tablet, TAKE 1 TABLET EVERY MORNING AND TAKE 1/2 TABLET EVERY EVENING, Disp: 135 tablet, Rfl: 3 .  predniSONE (DELTASONE) 10 MG tablet, TAKE 2 TABLETS EVERY MORNING WITH BREAKFAST (Patient taking differently: Take 10 mg by mouth as needed. ), Disp: 30 tablet, Rfl: 5 .  PROAIR HFA 108 (90 Base) MCG/ACT inhaler, Inhale 1-2 puffs into the lungs every 6 (six) hours as needed for wheezing or shortness of breath. , Disp: , Rfl:  .  VITAMIN D, CHOLECALCIFEROL, PO, Take 1 tablet by mouth daily., Disp: , Rfl:  Social History   Socioeconomic History  . Marital status: Married    Spouse name: Not on file  . Number of children: Not on file  . Years of education: Not on file  . Highest education level: Not on file  Occupational History  . Occupation: Retired  Engineer, production  . Financial resource strain: Not on file  . Food insecurity:    Worry: Not on file    Inability: Not on  file  . Transportation needs:    Medical: Not on file    Non-medical: Not on file  Tobacco Use  . Smoking status: Never Smoker  . Smokeless tobacco: Never Used  . Tobacco comment: Tobacco use-no  Substance and Sexual Activity  . Alcohol use: No    Alcohol/week: 0.0 standard drinks  . Drug use: No  . Sexual activity: Not on file  Lifestyle  . Physical activity:    Days per week: Not on file    Minutes per session: Not on file  . Stress: Not on file  Relationships  . Social connections:    Talks on phone: Not on file    Gets together: Not on file    Attends religious service: Not on file      Active member of club or organization: Not on file    Attends meetings of clubs or organizations: Not on file    Relationship status: Not on file  . Intimate partner violence:    Fear of current or ex partner: Not on file    Emotionally abused: Not on file    Physically abused: Not on file    Forced sexual activity: Not on file  Other Topics Concern  . Not on file  Social History Narrative  . Not on file   Family History  Problem Relation Age of Onset  . Cancer Father   . Cancer Mother   . Heart disease Sister        CABG age 38  . Cancer Brother     Objective: Office vital signs reviewed. BP (!) 161/72   Pulse 73   Temp (!) 97.5 F (36.4 C) (Oral)   Ht 5\' 6"  (1.676 m)   Wt 235 lb 6.4 oz (106.8 kg)   SpO2 95% Comment: Resting with room air.  BMI 37.99 kg/m   Physical Examination:  General: Awake, alert, nontoxic. No acute distress Cardio: regular rate and rhythm, S1S2 heard, no murmurs appreciated Pulm: No appreciable wheezes.  Air movement is fair.  No rhonchi or rales; normal work of breathing on room air Extremities: Pedal edema noted.  She has associated venous stasis changes.  Dg Chest 2 View  Result Date: 06/30/2018 CLINICAL DATA:  COPD, cough. EXAM: CHEST - 2 VIEW COMPARISON:  11/13/2017. FINDINGS: Trachea is midline. Heart size stable. Thoracic aorta is calcified. Lungs are clear. No pleural. IMPRESSION: 1. No acute findings. 2.  Aortic atherosclerosis (ICD10-170.0). Electronically Signed   By: 11/15/2017 M.D.   On: 06/30/2018 15:58   Assessment/ Plan: 84 y.o. female   1. Cough Oxygen saturation on room air is within normal limits.  Her physical exam was notable for resolution of wheezes and persistent fair air movement.  She has normal work of breathing on room air.  Chest x-ray is obtained to further evaluate.  Personal review showed a possible small pleural effusion on the left.  No evidence of acute pulmonary infiltrates to support pneumonia.   Radiology review read as no acute processes within the lungs.  Given persistent cough, I have added Daliresp to her regimen in hopes that this may give her some reprieve.  She will follow-up with her PCP in 4 weeks for titration of the medication if needed.  I encouraged her to follow-up with pulmonology.  Daughter wanted to see if perhaps she might benefit from a second opinion.  Referral to new pulmonologist in [place] has been placed. - DG Chest 2 View; Future  2. Simple chronic  bronchitis (HCC) As above. - Ambulatory referral to Pulmonology   Orders Placed This Encounter  Procedures  . DG Chest 2 View    Standing Status:   Future    Number of Occurrences:   1    Standing Expiration Date:   08/31/2019    Order Specific Question:   Reason for Exam (SYMPTOM  OR DIAGNOSIS REQUIRED)    Answer:   wheeze, cough    Order Specific Question:   Preferred imaging location?    Answer:   Internal    Order Specific Question:   Radiology Contrast Protocol - do NOT remove file path    Answer:   [email or web address]  . Ambulatory referral to Pulmonology    Referral Priority:   Routine    Referral Type:   Consultation    Referral Reason:   Specialty Services Required    Requested Specialty:   Pulmonary Disease    Number of Visits Requested:   1   Meds ordered this encounter  Medications  . Roflumilast (DALIRESP) 250 MCG TABS    Sig: Take 250 mcg by mouth daily.    Dispense:  28 tablet    Refill:  0     Ashly Hulen Skains, DO [facility] [phone]

## 2018-06-30 NOTE — Patient Instructions (Addendum)
I have added Daliresp for you to take daily.  Your started on the 250 mg daily dose.  You will need titration up to the 500 mg dose in 4 weeks.  Schedule follow-up with [name] to be seen in 4 weeks.  I have also placed a new referral to pulmonology.  You should be contacted for an appointment at some point.

## 2018-07-05 ENCOUNTER — Encounter: Payer: Self-pay | Admitting: Physician Assistant

## 2018-07-05 ENCOUNTER — Ambulatory Visit (INDEPENDENT_AMBULATORY_CARE_PROVIDER_SITE_OTHER): Payer: Medicare Other | Admitting: Physician Assistant

## 2018-07-05 VITALS — BP 120/68 | HR 73 | Temp 98.6°F | Ht 66.0 in | Wt 235.0 lb

## 2018-07-05 DIAGNOSIS — J329 Chronic sinusitis, unspecified: Secondary | ICD-10-CM | POA: Diagnosis not present

## 2018-07-05 DIAGNOSIS — L03116 Cellulitis of left lower limb: Secondary | ICD-10-CM

## 2018-07-05 MED ORDER — CEPHALEXIN 500 MG PO CAPS: 500.0000 mg | ORAL_CAPSULE | Freq: Four times a day (QID) | ORAL | 0 refills | Status: DC

## 2018-07-07 NOTE — Progress Notes (Signed)
BP 120/68 (BP Location: Left Arm)   Pulse 73   Temp 98.6 F (37 C) (Oral)   Ht 5\' 6"  (1.676 m)   Wt 235 lb (106.6 kg)   SpO2 95%   BMI 37.93 kg/m     Subjective:    Patient ID: , female    DOB: 1932-12-24, 84 y.o.   MRN: [id number]  HPI: Courtney Berry is a 84 y.o. female presenting on 07/05/2018 for Cough (some better) and left foot swelling and infection Patient with several days of progressing upper respiratory and bronchial symptoms. Initially there was more upper respiratory congestion. This progressed to having significant cough that is productive throughout the day and severe at night. There is occasional wheezing after coughing. Sometimes there is slight dyspnea on exertion. It is productive mucus that is yellow in color. Denies any blood.  She has also had a recurrence of cellulitis in her lower leg.  The left is quite red,Swollen, warm to the touch.  The right leg is mildly erythematous.  She denies any fever or chills.   Past Medical History:  Diagnosis Date  . COPD (chronic obstructive pulmonary disease) (HCC)   . Diverticulosis   . Essential hypertension, benign   . History of chest pain    Low risk Cardiolite 2010  . Mixed hyperlipidemia   . PAF (paroxysmal atrial fibrillation) (HCC)   . Rheumatoid arthritis (HCC)    Relevant past medical, surgical, family and social history reviewed and updated as indicated. Interim medical history since our last visit reviewed. Allergies and medications reviewed and updated. DATA REVIEWED: CHART IN EPIC  Family History reviewed for pertinent findings.  Review of Systems  Constitutional: Positive for chills and fatigue. Negative for activity change, appetite change and fever.  HENT: Positive for congestion, postnasal drip and sore throat.   Eyes: Negative.   Respiratory: Positive for cough. Negative for wheezing.   Cardiovascular: Negative.  Negative for chest pain, palpitations and leg swelling.    Gastrointestinal: Negative.   Genitourinary: Negative.   Musculoskeletal: Negative.   Skin: Positive for color change.  Neurological: Positive for headaches.    Allergies as of 07/05/2018      Reactions   Darvocet [propoxyphene N-acetaminophen] Other (See Comments)   unknown   Nitrofurantoin Monohyd Macro Other (See Comments)   unknown   Plavix [clopidogrel Bisulfate] Swelling   Face swells   Sulfa Antibiotics Other (See Comments)   "face got puffy"   Penicillins Rash   Has patient had a PCN reaction causing immediate rash, facial/tongue/throat swelling, SOB or lightheadedness with hypotension: Yes Has patient had a PCN reaction causing severe rash involving mucus membranes or skin necrosis: No Has patient had a PCN reaction that required hospitalization: No Has patient had a PCN reaction occurring within the last 10 years: No If all of the above answers are "NO", then may proceed with Cephalosporin use.      Medication List        Accurate as of 07/05/18 11:59 PM. Always use your most recent med list.          acetaminophen 500 MG tablet Commonly known as:  TYLENOL Take 1,000 mg by mouth every 6 (six) hours as needed for moderate pain.   ANORO ELLIPTA 62.5-25 MCG/INH Aepb Generic drug:  umeclidinium-vilanterol Inhale 1 puff into the lungs daily.   cephALEXin 500 MG capsule Commonly known as:  KEFLEX Take 1 capsule (500 mg total) by mouth 4 (four) times daily.  ELIQUIS 5 MG Tabs tablet Generic drug:  apixaban TAKE ONE TABLET BY MOUTH TWICE DAILY   lisinopril 20 MG tablet Commonly known as:  PRINIVIL,ZESTRIL TAKE ONE (1) TABLET EACH DAY   metoprolol succinate 25 MG 24 hr tablet Commonly known as:  TOPROL-XL TAKE 1 TABLET EVERY MORNING AND TAKE 1/2 TABLET EVERY EVENING   predniSONE 10 MG tablet Commonly known as:  DELTASONE TAKE 2 TABLETS EVERY MORNING WITH BREAKFAST   PROAIR HFA 108 (90 Base) MCG/ACT inhaler Generic drug:  albuterol Inhale 1-2 puffs into  the lungs every 6 (six) hours as needed for wheezing or shortness of breath.   Roflumilast 250 MCG Tabs Take 250 mcg by mouth daily.   VITAMIN D (CHOLECALCIFEROL) PO Take 1 tablet by mouth daily.          Objective:    BP 120/68 (BP Location: Left Arm)   Pulse 73   Temp 98.6 F (37 C) (Oral)   Ht 5\' 6"  (1.676 m)   Wt 235 lb (106.6 kg)   SpO2 95%   BMI 37.93 kg/m    Allergies  Allergen Reactions  . Darvocet [Propoxyphene N-Acetaminophen] Other (See Comments)    unknown  . Nitrofurantoin Monohyd Macro Other (See Comments)    unknown  . Plavix [Clopidogrel Bisulfate] Swelling    Face swells  . Sulfa Antibiotics Other (See Comments)    "face got puffy"  . Penicillins Rash    Has patient had a PCN reaction causing immediate rash, facial/tongue/throat swelling, SOB or lightheadedness with hypotension: Yes Has patient had a PCN reaction causing severe rash involving mucus membranes or skin necrosis: No Has patient had a PCN reaction that required hospitalization: No Has patient had a PCN reaction occurring within the last 10 years: No If all of the above answers are "NO", then may proceed with Cephalosporin use.     Wt Readings from Last 3 Encounters:  07/05/18 235 lb (106.6 kg)  06/30/18 235 lb 6.4 oz (106.8 kg)  06/22/18 237 lb (107.5 kg)    Physical Exam  Constitutional: She is oriented to person, place, and time. She appears well-developed and well-nourished.  HENT:  Head: Normocephalic and atraumatic.  Right Ear: There is drainage and tenderness.  Left Ear: There is drainage and tenderness.  Nose: Mucosal edema and rhinorrhea present. Right sinus exhibits no maxillary sinus tenderness and no frontal sinus tenderness. Left sinus exhibits no maxillary sinus tenderness and no frontal sinus tenderness.  Mouth/Throat: Oropharyngeal exudate and posterior oropharyngeal erythema present.  Eyes: Pupils are equal, round, and reactive to light. Conjunctivae and EOM are  normal.  Neck: Normal range of motion. Neck supple.  Cardiovascular: Normal rate, regular rhythm, normal heart sounds and intact distal pulses.  Pulmonary/Chest: Effort normal. She has wheezes in the right upper field and the left upper field.  Abdominal: Soft. Bowel sounds are normal.  Neurological: She is alert and oriented to person, place, and time. She has normal reflexes.  Skin: Skin is warm and dry. No rash noted. There is erythema.     Erythematous warm area on the left side of the leg, mild erythema on the right lower leg  Psychiatric: She has a normal mood and affect. Her behavior is normal. Judgment and thought content normal.        Assessment & Plan:   1. Sinusitis, unspecified chronicity, unspecified location - cephALEXin (KEFLEX) 500 MG capsule; Take 1 capsule (500 mg total) by mouth 4 (four) times daily.  Dispense: 40 capsule;  Refill: 0  2. Cellulitis of left lower extremity - cephALEXin (KEFLEX) 500 MG capsule; Take 1 capsule (500 mg total) by mouth 4 (four) times daily.  Dispense: 40 capsule; Refill: 0   Continue all other maintenance medications as listed above.  Follow up plan: No follow-ups on file.  Educational handout given for survey  Remus Loffler PA-C [facility] [address]  [place], [place] [address] [phone]   07/07/2018, 10:21 PM

## 2018-07-16 ENCOUNTER — Ambulatory Visit: Payer: Medicare Other | Admitting: Physician Assistant

## 2018-08-01 ENCOUNTER — Inpatient Hospital Stay (HOSPITAL_COMMUNITY)
Admission: EM | Admit: 2018-08-01 | Discharge: 2018-08-02 | DRG: 309 | Disposition: A | Discharge status: Home / Self Care | Payer: Medicare Other | Attending: Family Medicine | Admitting: Family Medicine

## 2018-08-01 ENCOUNTER — Other Ambulatory Visit: Payer: Self-pay

## 2018-08-01 ENCOUNTER — Encounter (HOSPITAL_COMMUNITY): Payer: Self-pay | Admitting: Emergency Medicine

## 2018-08-01 ENCOUNTER — Emergency Department (HOSPITAL_COMMUNITY): Payer: Medicare Other

## 2018-08-01 DIAGNOSIS — Z885 Allergy status to narcotic agent status: Secondary | ICD-10-CM

## 2018-08-01 DIAGNOSIS — Z882 Allergy status to sulfonamides status: Secondary | ICD-10-CM

## 2018-08-01 DIAGNOSIS — R05 Cough: Secondary | ICD-10-CM | POA: Diagnosis not present

## 2018-08-01 DIAGNOSIS — Z880 Allergy status to penicillin: Secondary | ICD-10-CM

## 2018-08-01 DIAGNOSIS — I480 Paroxysmal atrial fibrillation: Secondary | ICD-10-CM | POA: Diagnosis not present

## 2018-08-01 DIAGNOSIS — R0602 Shortness of breath: Secondary | ICD-10-CM | POA: Diagnosis not present

## 2018-08-01 DIAGNOSIS — M069 Rheumatoid arthritis, unspecified: Secondary | ICD-10-CM | POA: Diagnosis not present

## 2018-08-01 DIAGNOSIS — I10 Essential (primary) hypertension: Secondary | ICD-10-CM | POA: Diagnosis not present

## 2018-08-01 DIAGNOSIS — Z7901 Long term (current) use of anticoagulants: Secondary | ICD-10-CM | POA: Diagnosis not present

## 2018-08-01 DIAGNOSIS — I4891 Unspecified atrial fibrillation: Secondary | ICD-10-CM | POA: Diagnosis not present

## 2018-08-01 DIAGNOSIS — M79661 Pain in right lower leg: Secondary | ICD-10-CM | POA: Diagnosis present

## 2018-08-01 DIAGNOSIS — E785 Hyperlipidemia, unspecified: Secondary | ICD-10-CM | POA: Diagnosis present

## 2018-08-01 DIAGNOSIS — Z888 Allergy status to other drugs, medicaments and biological substances status: Secondary | ICD-10-CM

## 2018-08-01 DIAGNOSIS — I890 Lymphedema, not elsewhere classified: Secondary | ICD-10-CM

## 2018-08-01 DIAGNOSIS — I5032 Chronic diastolic (congestive) heart failure: Secondary | ICD-10-CM | POA: Diagnosis not present

## 2018-08-01 DIAGNOSIS — J449 Chronic obstructive pulmonary disease, unspecified: Secondary | ICD-10-CM | POA: Diagnosis present

## 2018-08-01 DIAGNOSIS — I110 Hypertensive heart disease with heart failure: Secondary | ICD-10-CM | POA: Diagnosis present

## 2018-08-01 DIAGNOSIS — E876 Hypokalemia: Secondary | ICD-10-CM | POA: Diagnosis present

## 2018-08-01 DIAGNOSIS — Z79899 Other long term (current) drug therapy: Secondary | ICD-10-CM

## 2018-08-01 DIAGNOSIS — E782 Mixed hyperlipidemia: Secondary | ICD-10-CM | POA: Diagnosis present

## 2018-08-01 DIAGNOSIS — Z7952 Long term (current) use of systemic steroids: Secondary | ICD-10-CM

## 2018-08-01 DIAGNOSIS — R002 Palpitations: Secondary | ICD-10-CM | POA: Diagnosis not present

## 2018-08-01 DIAGNOSIS — I739 Peripheral vascular disease, unspecified: Secondary | ICD-10-CM | POA: Diagnosis present

## 2018-08-01 DIAGNOSIS — M79662 Pain in left lower leg: Secondary | ICD-10-CM

## 2018-08-01 HISTORY — DX: Lymphedema, not elsewhere classified: I89.0

## 2018-08-01 LAB — CBC
HEMATOCRIT: 45.9 % (ref 36.0–46.0)
Hemoglobin: 14.2 g/dL (ref 12.0–15.0)
MCH: 31 pg (ref 26.0–34.0)
MCHC: 30.9 g/dL (ref 30.0–36.0)
MCV: 100.2 fL — ABNORMAL HIGH (ref 78.0–100.0)
Platelets: 183 10*3/uL (ref 150–400)
RBC: 4.58 MIL/uL (ref 3.87–5.11)
RDW: 13 % (ref 11.5–15.5)
WBC: 9 10*3/uL (ref 4.0–10.5)

## 2018-08-01 LAB — COMPREHENSIVE METABOLIC PANEL
ALBUMIN: 3.3 g/dL — AB (ref 3.5–5.0)
ALT: 14 U/L (ref 0–44)
ANION GAP: 9 (ref 5–15)
AST: 14 U/L — ABNORMAL LOW (ref 15–41)
Alkaline Phosphatase: 54 U/L (ref 38–126)
BILIRUBIN TOTAL: 1.3 mg/dL — AB (ref 0.3–1.2)
BUN: 13 mg/dL (ref 8–23)
CO2: 29 mmol/L (ref 22–32)
Calcium: 9.1 mg/dL (ref 8.9–10.3)
Chloride: 105 mmol/L (ref 98–111)
Creatinine, Ser: 1 mg/dL (ref 0.44–1.00)
GFR calc Af Amer: 59 mL/min — ABNORMAL LOW (ref >60)
GFR calc non Af Amer: 51 mL/min — ABNORMAL LOW (ref >60)
Glucose, Bld: 154 mg/dL — ABNORMAL HIGH (ref 70–99)
POTASSIUM: 3.5 mmol/L (ref 3.5–5.1)
Sodium: 143 mmol/L (ref 135–145)
TOTAL PROTEIN: 6.8 g/dL (ref 6.5–8.1)

## 2018-08-01 LAB — BRAIN NATRIURETIC PEPTIDE
B Natriuretic Peptide: 134 pg/mL — ABNORMAL HIGH (ref 0.0–100.0)
B Natriuretic Peptide: 125 pg/mL — ABNORMAL HIGH (ref 0.0–100.0)

## 2018-08-01 LAB — TROPONIN I
Troponin I: <0.03 ng/mL (ref <0.03)
Troponin I: <0.03 ng/mL (ref <0.03)

## 2018-08-01 LAB — TSH: TSH: 1.004 u[IU]/mL (ref 0.350–4.500)

## 2018-08-01 MED ORDER — LISINOPRIL 10 MG PO TABS
20.0000 mg | ORAL_TABLET | Freq: Once | ORAL | Status: AC
  Administered 2018-08-01: 20 mg via ORAL
  Filled 2018-08-01: qty 2

## 2018-08-01 MED ORDER — UMECLIDINIUM-VILANTEROL 62.5-25 MCG/INH IN AEPB
1.0000 | INHALATION_SPRAY | Freq: Every day | RESPIRATORY_TRACT | Status: DC
  Administered 2018-08-02: 1 via RESPIRATORY_TRACT
  Filled 2018-08-01: qty 14

## 2018-08-01 MED ORDER — DILTIAZEM HCL-DEXTROSE 100-5 MG/100ML-% IV SOLN (PREMIX)
5.0000 mg/h | Freq: Once | INTRAVENOUS | Status: AC
  Administered 2018-08-01: 5 mg/h via INTRAVENOUS
  Filled 2018-08-01: qty 100

## 2018-08-01 MED ORDER — APIXABAN 5 MG PO TABS
5.0000 mg | ORAL_TABLET | Freq: Two times a day (BID) | ORAL | Status: DC
  Administered 2018-08-01 – 2018-08-02 (×2): 5 mg via ORAL
  Filled 2018-08-01 (×2): qty 1

## 2018-08-01 MED ORDER — ACETAMINOPHEN 325 MG PO TABS
650.0000 mg | ORAL_TABLET | Freq: Once | ORAL | Status: AC
  Administered 2018-08-01: 650 mg via ORAL
  Filled 2018-08-01: qty 2

## 2018-08-01 MED ORDER — DILTIAZEM HCL-DEXTROSE 100-5 MG/100ML-% IV SOLN (PREMIX)
5.0000 mg/h | INTRAVENOUS | Status: DC
  Administered 2018-08-01 – 2018-08-02 (×2): 5 mg/h via INTRAVENOUS
  Filled 2018-08-01: qty 100

## 2018-08-01 MED ORDER — LISINOPRIL 10 MG PO TABS
20.0000 mg | ORAL_TABLET | Freq: Every day | ORAL | Status: DC
  Administered 2018-08-02: 20 mg via ORAL
  Filled 2018-08-01: qty 2

## 2018-08-01 MED ORDER — PREDNISONE 10 MG PO TABS
10.0000 mg | ORAL_TABLET | Freq: Every day | ORAL | Status: DC
  Administered 2018-08-02: 10 mg via ORAL
  Filled 2018-08-01: qty 1

## 2018-08-01 MED ORDER — IPRATROPIUM-ALBUTEROL 0.5-2.5 (3) MG/3ML IN SOLN: 3.0000 mL | RESPIRATORY_TRACT | Status: DC | PRN

## 2018-08-01 MED ORDER — METOPROLOL SUCCINATE ER 25 MG PO TB24: 25.0000 mg | ORAL_TABLET | Freq: Two times a day (BID) | ORAL | Status: DC

## 2018-08-01 MED ORDER — DILTIAZEM HCL 25 MG/5ML IV SOLN
20.0000 mg | Freq: Once | INTRAVENOUS | Status: AC
  Administered 2018-08-01: 20 mg via INTRAVENOUS
  Filled 2018-08-01: qty 5

## 2018-08-01 MED ORDER — ONDANSETRON HCL 4 MG/2ML IJ SOLN: 4.0000 mg | Freq: Four times a day (QID) | INTRAMUSCULAR | Status: DC | PRN

## 2018-08-01 MED ORDER — METOPROLOL SUCCINATE ER 25 MG PO TB24
25.0000 mg | ORAL_TABLET | Freq: Once | ORAL | Status: AC
  Administered 2018-08-01: 25 mg via ORAL
  Filled 2018-08-01: qty 1

## 2018-08-01 MED ORDER — APIXABAN 5 MG PO TABS
5.0000 mg | ORAL_TABLET | Freq: Once | ORAL | Status: AC
  Administered 2018-08-01: 5 mg via ORAL
  Filled 2018-08-01: qty 1

## 2018-08-01 MED ORDER — TRAZODONE HCL 50 MG PO TABS: 25.0000 mg | ORAL_TABLET | Freq: Every evening | ORAL | Status: DC | PRN

## 2018-08-01 MED ORDER — ACETAMINOPHEN 325 MG PO TABS: 650.0000 mg | ORAL_TABLET | ORAL | Status: DC | PRN

## 2018-08-01 NOTE — ED Notes (Signed)
EDP at bedside  

## 2018-08-01 NOTE — ED Notes (Signed)
Patient transported to X-ray 

## 2018-08-01 NOTE — ED Provider Notes (Addendum)
[facility] EMERGENCY DEPARTMENT Provider Note   CSN: [id number] Arrival date & time: 08/01/18  6606     History   Chief Complaint Chief Complaint  Patient presents with  . Cough  . Palpitations    HPI Courtney Berry is a 84 y.o. female with a history significant for COPD, HTN and paroxysmal afib on eliquis, metoprolol and lisinopril presenting with a 3 day history of increased shortness of breath, non productive cough and increased episodes of afib which causes jitteriness and chest heaviness in addition to the sob.  Prior to the last 3 days, she would have a rare episode of this which resolved after increasing her nighttime dose of toprol XL to a full tablet instead of the normal 1/2.  She has increased this for the past 3 nights with more frequent and longer lasting instead of improved sx.  Her symptoms are worse when supine also notices bending forward can trigger an episode.  Her cough is not productive, denies fevers, chills, wheezing or increased peripheral edema.  She does have bilateral lower extremity chronic lymphedema and states her legs are less swollen than normal. Denies h/o CHF. She has hctz for prn use but is not taking this medicine.  She has had no changes in her medicines and has not missed any doses.  She did have a recent course of keflex for cellulitis in her lower legs which she endorses is much improved.   No hx of CHF, last echo 11/19/16 mild LVH with EF 60-65%, grade 1 diastolic dysfunction.   The history is provided by the patient.    Past Medical History:  Diagnosis Date  . COPD (chronic obstructive pulmonary disease) (HCC)   . Diverticulosis   . Essential hypertension, benign   . History of chest pain    Low risk Cardiolite 2010  . Lymphedema   . Mixed hyperlipidemia   . PAF (paroxysmal atrial fibrillation) (HCC)   . Rheumatoid arthritis [facility])     Patient Active Problem List   Diagnosis Date Noted  . Acute cystitis with hematuria 09/01/2017  .  Lymphedema 08/31/2017  . Lymphedema of both lower extremities 02/09/2017  . Hypotension due to drugs 11/24/2016  . Pain in both lower legs 11/24/2016  . Rheumatoid arthritis involving multiple sites with positive rheumatoid factor (HCC) 10/20/2016  . Peripheral vascular disease (HCC) 10/20/2016  . Atrial fibrillation with rapid ventricular response (HCC) 08/01/2016  . Atrial fibrillation with RVR (HCC) 08/01/2016  . Bradycardia 08/01/2016  . Atrial fibrillation, new onset (HCC) 02/14/2016  . Chronic anticoagulation 02/14/2016  . Palpitations 03/02/2012  . Dyslipidemia 09/03/2009  . Essential hypertension 09/03/2009  . Shortness of breath 09/03/2009    Past Surgical History:  Procedure Laterality Date  . TOTAL ABDOMINAL HYSTERECTOMY  1969     OB History   None      Home Medications    Prior to Admission medications   Medication Sig Start Date End Date Taking? Authorizing Provider  acetaminophen (TYLENOL) 500 MG tablet Take 1,000 mg by mouth every 6 (six) hours as needed for moderate pain.   Yes [provider]  ELIQUIS 5 MG TABS tablet TAKE ONE TABLET BY MOUTH TWICE DAILY 11/11/17  Yes Jonelle Sidle, MD  lisinopril (PRINIVIL,ZESTRIL) 20 MG tablet TAKE ONE (1) TABLET EACH DAY 02/08/18  Yes Remus Loffler, PA-C  metoprolol succinate (TOPROL-XL) 25 MG 24 hr tablet TAKE 1 TABLET EVERY MORNING AND TAKE 1/2 TABLET EVERY EVENING 01/19/18  Yes Nona Dell  G, MD  predniSONE (DELTASONE) 10 MG tablet TAKE 2 TABLETS EVERY MORNING WITH BREAKFAST Patient taking differently: Take 10 mg by mouth as needed.  04/15/18  Yes Remus Loffler, PA-C  PROAIR HFA 108 [phone] Base) MCG/ACT inhaler Inhale 1-2 puffs into the lungs every 6 (six) hours as needed for wheezing or shortness of breath.  09/23/16  Yes [provider]  umeclidinium-vilanterol (ANORO ELLIPTA) 62.5-25 MCG/INH AEPB Inhale 1 puff into the lungs daily.   Yes [provider]  cephALEXin (KEFLEX) 500 MG  capsule Take 1 capsule (500 mg total) by mouth 4 (four) times daily. Patient not taking: Reported on 08/01/2018 07/05/18   Remus Loffler, PA-C  Roflumilast (DALIRESP) 250 MCG TABS Take 250 mcg by mouth daily. Patient not taking: Reported on 08/01/2018 06/30/18   Raliegh Ip, DO  VITAMIN D, CHOLECALCIFEROL, PO Take 1 tablet by mouth daily.    [provider]    Family History Family History  Problem Relation Age of Onset  . Cancer Father   . Cancer Mother   . Heart disease Sister        CABG age 35  . Cancer Brother     Social History Social History   Tobacco Use  . Smoking status: Never Smoker  . Smokeless tobacco: Never Used  . Tobacco comment: Tobacco use-no  Substance Use Topics  . Alcohol use: No    Alcohol/week: 0.0 standard drinks  . Drug use: No     Allergies   Darvocet [propoxyphene n-acetaminophen]; Nitrofurantoin monohyd macro; Plavix [clopidogrel bisulfate]; Sulfa antibiotics; and Penicillins   Review of Systems Review of Systems  Constitutional: Negative for fever.  HENT: Negative for congestion and sore throat.   Eyes: Negative.   Respiratory: Positive for chest tightness and shortness of breath. Negative for wheezing.   Cardiovascular: Positive for palpitations and leg swelling. Negative for chest pain.  Gastrointestinal: Negative for abdominal pain, nausea and vomiting.  Genitourinary: Negative.   Musculoskeletal: Negative for arthralgias, joint swelling and neck pain.  Skin: Negative.  Negative for rash and wound.  Neurological: Negative for dizziness, weakness, light-headedness, numbness and headaches.  Psychiatric/Behavioral: Negative.      Physical Exam Updated Vital Signs BP 138/73   Pulse (!) 58   Temp 98.1 F (36.7 C) (Oral)   Resp (!) 28   Ht 5\' 6"  (1.676 m)   Wt 106.6 kg   SpO2 93%   BMI 37.93 kg/m   Physical Exam  Constitutional: She appears well-developed and well-nourished.  HENT:  Head: Normocephalic and  atraumatic.  Eyes: Conjunctivae are normal.  Neck: Normal range of motion.  Cardiovascular: Normal rate, regular rhythm, normal heart sounds and intact distal pulses.  On exam, occasional premature beat, no irregular rhythm.   Pulmonary/Chest: Effort normal and breath sounds normal. No stridor. No respiratory distress. She has no wheezes.  Abdominal: Soft. Bowel sounds are normal. There is no tenderness. There is no guarding.  Musculoskeletal: Normal range of motion. She exhibits edema. She exhibits no tenderness.  Bilateral foot and ankle edema.  Pink without erythema.  Neurological: She is alert.  Skin: Skin is warm and dry.  Psychiatric: She has a normal mood and affect.  Nursing note and vitals reviewed.    ED Treatments / Results  Labs (all labs ordered are listed, but only abnormal results are displayed) Labs Reviewed  CBC - Abnormal; Notable for the following components:      Result Value   MCV 100.2 (*)  All other components within normal limits  COMPREHENSIVE METABOLIC PANEL - Abnormal; Notable for the following components:   Glucose, Bld 154 (*)    Albumin 3.3 (*)    AST 14 (*)    Total Bilirubin 1.3 (*)    GFR calc non Af Amer 51 (*)    GFR calc Af Amer 59 (*)    All other components within normal limits  BRAIN NATRIURETIC PEPTIDE - Abnormal; Notable for the following components:   B Natriuretic Peptide 134.0 (*)    All other components within normal limits  TROPONIN I    EKG EKG Interpretation  Date/Time:  Sunday August 01 2018 09:52:37 EDT Ventricular Rate:  72 PR Interval:    QRS Duration: 86 QT Interval:  395 QTC Calculation: 433 R Axis:   17 Text Interpretation:  Sinus rhythm Multiple premature complexes, vent & supraven Low voltage, precordial leads Borderline repolarization abnormality Confirmed by Bethann Berkshire [id number]) on 08/01/2018 11:56:26 AM   Radiology Dg Chest 2 View  Result Date: 08/01/2018 CLINICAL DATA:  Cough and shortness of  breath since Thursday. EXAM: CHEST - 2 VIEW COMPARISON:  Chest x-ray dated 06/30/2018 and chest x-ray dated 11/13/2017. FINDINGS: Heart size is upper normal, stable. Lungs are clear. No pleural effusion or pneumothorax seen. No acute or suspicious osseous finding. Chronic elevation of the RIGHT hemidiaphragm. IMPRESSION: No active cardiopulmonary disease. No evidence of pneumonia or pulmonary edema. Electronically Signed   By: Bary Richard M.D.   On: 08/01/2018 10:58    Procedures Procedures (including critical care time)  Medications Ordered in ED Medications  acetaminophen (TYLENOL) tablet 650 mg (has no administration in time range)  diltiazem (CARDIZEM) 100 mg in dextrose 5% (1 mg/mL) infusion (has no administration in time range)  lisinopril (PRINIVIL,ZESTRIL) tablet 20 mg (20 mg Oral Given 08/01/18 1032)  metoprolol succinate (TOPROL-XL) 24 hr tablet 25 mg (25 mg Oral Given 08/01/18 1033)  apixaban (ELIQUIS) tablet 5 mg (5 mg Oral Given 08/01/18 1032)  diltiazem (CARDIZEM) injection 20 mg (20 mg Intravenous Given 08/01/18 1248)     Initial Impression / Assessment and Plan / ED Course  I have reviewed the triage vital signs and the nursing notes.  Pertinent labs & imaging results that were available during my care of the patient were reviewed by me and considered in my medical decision making (see chart for details).     On monitor, pt had multiple episodes of transient afib during interview and visit, resolves spontaneously and very quickly.  Labs stable.  Discussed with Dr. Estell Harpin. Will increase metoprolol to 1.5 tabs (37.5 mg) bid with close f/u with pcp this week.   1:14 PM During discussion prior to dc home,  Pt was in afib with rvr persistently for more than 10 minutes.  She was given cardizen 20 mg IV which improved her rate fairly quickly.  Drip started.  Will call for admission.  CRITICAL CARE Performed by: Burgess Amor Total critical care time: 35 minutes Critical  care time was exclusive of separately billable procedures and treating other patients. Critical care was necessary to treat or prevent imminent or life-threatening deterioration. Critical care was time spent personally by me on the following activities: development of treatment plan with patient and/or surrogate as well as nursing, discussions with consultants, evaluation of patient's response to treatment, examination of patient, obtaining history from patient or surrogate, ordering and performing treatments and interventions, ordering and review of laboratory studies, ordering and review of radiographic studies, pulse  oximetry and re-evaluation of patient's condition.   Final Clinical Impressions(s) / ED Diagnoses   Final diagnoses:  Atrial fibrillation with RVR [facility])    ED Discharge Orders    None       Victoriano Lain 08/01/18 1152    Burgess Amor, PA-C 08/01/18 1202    Burgess Amor, PA-C 08/01/18 1328    Bethann Berkshire, MD 08/01/18 1428    Burgess Amor, PA-C 08/18/18 2210    Bethann Berkshire, MD 08/20/18 [phone]

## 2018-08-01 NOTE — ED Notes (Signed)
Pt returned from xray

## 2018-08-01 NOTE — H&P (Addendum)
Admission History and Physical  Courtney Berry YHC:[id number] DOB: [birth year] DOA: 08/01/2018  Referring physician: Lincoln Maxin PCP: Remus Loffler, PA-C   Chief Complaint: recurrent worrisome palpitations  HPI: Courtney Berry is a 84 y.o. female with known paroxysmal atrial fibrillation presented to the ED with 3 days of progressive shortness of breath and palpitations and inability to lie flat and feeling of smothering when lying down.  According to the patient, her symptoms first started after she had walked a long distance when she had to go and see her husband in the hospital.  She has become deconditioned and usually does not ambulate for long distances.  Her palpitations started shortly after that.  She says she did not have chest pain at that time but was short of breath.  The patient is chronically anticoagulated with apixaban 5 mg twice daily.  The patient had increased her regularly dose of 25 mg metoprolol XL to 1 full tablet twice daily with only minimal improvement/control in these episodes of palpitations that last several seconds up to several minutes.  The patient reports swelling in the feet and legs which remains chronic due to her lymphedema.  She denies history of congestive heart failure.  Her most recent echocardiogram from 11/19/16 showed mild LVH with EF 60-65%, grade 1 diastolic dysfunction.  She is followed closely by the cardiology service and was last seen in July 2019.  The patient denies chest pain.  She does report heaviness in the chest.  She has been having a nonproductive cough but no fever chills or wheezing.  The patient was recently treated for a bout of cellulitis in the lower legs which has resolved.  ED course: The patient was seen in the emergency department and noted to have several intermittent episodes of atrial fibrillation.   Unfortunately just prior to being sent home she went into A. fib with RVR.  She had to be started on an IV diltiazem infusion after having  been given 20 mg of IV Cardizem with only minimal improvement in the heart rate.  She is being admitted to the stepdown unit for a titrated Cardizem infusion for rate control.  Her labs and chest x-ray were essentially unremarkable.  Her initial troponin was less than 0.03.  Review of Systems: All systems reviewed and apart from history of presenting illness, are negative.  Past Medical History:  Diagnosis Date  . COPD (chronic obstructive pulmonary disease) (HCC)   . Diverticulosis   . Essential hypertension, benign   . History of chest pain    Low risk Cardiolite 2010  . Lymphedema   . Mixed hyperlipidemia   . PAF (paroxysmal atrial fibrillation) (HCC)   . Rheumatoid arthritis [facility])    Past Surgical History:  Procedure Laterality Date  . TOTAL ABDOMINAL HYSTERECTOMY  1969   Social History:  reports that she has never smoked. She has never used smokeless tobacco. She reports that she does not drink alcohol or use drugs.  Allergies  Allergen Reactions  . Darvocet [Propoxyphene N-Acetaminophen] Other (See Comments)    unknown  . Nitrofurantoin Monohyd Macro Other (See Comments)    unknown  . Plavix [Clopidogrel Bisulfate] Swelling    Face swells  . Sulfa Antibiotics Other (See Comments)    "face got puffy"  . Penicillins Rash    Has patient had a PCN reaction causing immediate rash, facial/tongue/throat swelling, SOB or lightheadedness with hypotension: Yes Has patient had a PCN reaction causing severe rash involving mucus membranes  or skin necrosis: No Has patient had a PCN reaction that required hospitalization: No Has patient had a PCN reaction occurring within the last 10 years: No If all of the above answers are "NO", then may proceed with Cephalosporin use.     Family History  Problem Relation Age of Onset  . Cancer Father   . Cancer Mother   . Heart disease Sister        CABG age 38  . Cancer Brother     Prior to Admission medications   Medication Sig Start  Date End Date Taking? Authorizing Provider  acetaminophen (TYLENOL) 500 MG tablet Take 1,000 mg by mouth every 6 (six) hours as needed for moderate pain.   Yes [provider]  ELIQUIS 5 MG TABS tablet TAKE ONE TABLET BY MOUTH TWICE DAILY 11/11/17  Yes Jonelle Sidle, MD  lisinopril (PRINIVIL,ZESTRIL) 20 MG tablet TAKE ONE (1) TABLET EACH DAY 02/08/18  Yes Remus Loffler, PA-C  metoprolol succinate (TOPROL-XL) 25 MG 24 hr tablet TAKE 1 TABLET EVERY MORNING AND TAKE 1/2 TABLET EVERY EVENING 01/19/18  Yes Jonelle Sidle, MD  predniSONE (DELTASONE) 10 MG tablet TAKE 2 TABLETS EVERY MORNING WITH BREAKFAST Patient taking differently: Take 10 mg by mouth as needed.  04/15/18  Yes Remus Loffler, PA-C  PROAIR HFA 108 [phone] Base) MCG/ACT inhaler Inhale 1-2 puffs into the lungs every 6 (six) hours as needed for wheezing or shortness of breath.  09/23/16  Yes [provider]  umeclidinium-vilanterol (ANORO ELLIPTA) 62.5-25 MCG/INH AEPB Inhale 1 puff into the lungs daily.   Yes [provider]  VITAMIN D, CHOLECALCIFEROL, PO Take 1 tablet by mouth daily.   Yes [provider]   Physical Exam: Vitals:   08/01/18 1230 08/01/18 1245 08/01/18 1255 08/01/18 1300  BP: (!) 161/86   138/73  Pulse: (!) 49 (!) 136 69 (!) 58  Resp: (!) 29 (!) 24 19 (!) 28  Temp:      TempSrc:      SpO2: 94% 95% 95% 93%  Weight:      Height:         General exam: Moderately built and nourished patient, lying comfortably supine on the gurney in no obvious distress.  Head, eyes and ENT: Nontraumatic and normocephalic. Pupils equally reacting to light and accommodation. Oral mucosa moist.  Neck: Supple. No JVD, carotid bruit or thyromegaly.  Lymphatics: No lymphadenopathy.  Respiratory system: Clear to auscultation. No increased work of breathing.  Cardiovascular system: S1 and S2 heard, irregularly irregular, tachycardic. No JVD, murmurs. 1+ bilateral pedal edema.   Gastrointestinal  system: Abdomen is nondistended, soft and nontender. Normal bowel sounds heard. No organomegaly or masses appreciated.  Central nervous system: Alert and oriented. No focal neurological deficits.  Extremities: Symmetric 5 x 5 power. Peripheral pulses symmetrically felt.   Skin: No rashes or acute findings.  Musculoskeletal system: Bilateral LE lymphedema.   Psychiatry: Pleasant and cooperative.  Labs on Admission:  Basic Metabolic Panel: Recent Labs  Lab 08/01/18 1021  NA 143  K 3.5  CL 105  CO2 29  GLUCOSE 154*  BUN 13  CREATININE 1.00  CALCIUM 9.1   Liver Function Tests: Recent Labs  Lab 08/01/18 1021  AST 14*  ALT 14  ALKPHOS 54  BILITOT 1.3*  PROT 6.8  ALBUMIN 3.3*   No results for input(s): LIPASE, AMYLASE in the last 168 hours. No results for input(s): AMMONIA in the last 168 hours. CBC: Recent Labs  Lab 08/01/18 1021  WBC 9.0  HGB 14.2  HCT 45.9  MCV 100.2*  PLT 183   Cardiac Enzymes: Recent Labs  Lab 08/01/18 1021  TROPONINI <0.03    BNP (last 3 results) No results for input(s): PROBNP in the last 8760 hours. CBG: No results for input(s): GLUCAP in the last 168 hours.  Radiological Exams on Admission: Dg Chest 2 View  Result Date: 08/01/2018 CLINICAL DATA:  Cough and shortness of breath since Thursday. EXAM: CHEST - 2 VIEW COMPARISON:  Chest x-ray dated 06/30/2018 and chest x-ray dated 11/13/2017. FINDINGS: Heart size is upper normal, stable. Lungs are clear. No pleural effusion or pneumothorax seen. No acute or suspicious osseous finding. Chronic elevation of the RIGHT hemidiaphragm. IMPRESSION: No active cardiopulmonary disease. No evidence of pneumonia or pulmonary edema. Electronically Signed   By: Bary Richard M.D.   On: 08/01/2018 10:58    EKG: Atrial Fibrillation   Assessment/Plan Principal Problem:   Atrial fibrillation with RVR (HCC) Active Problems:   Dyslipidemia   Essential hypertension   Shortness of breath    Palpitations   Chronic anticoagulation   Rheumatoid arthritis (HCC)   Peripheral vascular disease (HCC)   Pain in both lower legs   Lymphedema of both lower extremities   PAF (paroxysmal atrial fibrillation) (HCC)   COPD (chronic obstructive pulmonary disease) (HCC)   1. Atrial fibrillation with rapid ventricular response-the patient was in the process of being discharged from the ED when she was noted to be in A. fib RVR.  She has been started on an IV diltiazem infusion that is titratable to achieve better rate control.  She will be restarted on her oral home medications tomorrow.  She is anticoagulated with apixaban which will be continued.  We will ask the cardiology service to see her tomorrow morning.  I am repeating her echocardiogram given her symptoms of heart failure and the acute changes associated with inability to control her atrial fibrillation.  We will cycle troponin.  TSH has been tested and pending. 2. Essential hypertension-we are planning to resume her home oral medications in the morning.  She has been given a dose of Toprol-XL in the ED 25 mg.  Hopefully we can resume metoprolol XL 25 mg twice daily in the morning and get her off the IV diltiazem infusion. 3. Chronic anticoagulation-patient has been on apixaban 5 mg twice daily which will be continued. 4. Chronic diastolic congestive heart failure- she does not seem to be an acute exacerbation at this time.  Her chest x-ray did not show pulmonary edema.  We will continue to monitor closely.  2D echocardiogram has been ordered. 5. COPD-resume home respiratory medications. 6. Chronic lymphedema of lower extremities-patient reports that the lymphedema has actually been better recently than it has in the past.  Follow clinically. 7. Dyslipidemia-morning lipid panel ordered. 8. Rheumatoid arthritis-patient is fairly dependent on prednisone.  Will continue daily prednisone to avoid adrenal insufficiency crisis. 9. Shortness of  breath-suspect this is secondary to A. fib with RVR.  Monitor in stepdown unit.  Chest x-ray did not show acute findings.  Follow.  Supplemental oxygen as needed.  DVT Prophylaxis: Apixaban Code Status: Full Family Communication: Patient's daughter at bedside and plans to stay overnight.  Disposition Plan: The patient needs to be in the hospital for IV rate control medications.  The patient needs a stepdown unit because she is on a titratable Cardizem infusion that requires close cardiac monitoring and close vital sign monitoring.  Critical  Care Time spent: 58 mins  Standley Dakins, MD [facility] Pager [phone]  If 7PM-7AM, please contact night-coverage [email or web address] Password [email or web address] 08/01/2018, 1:40 PM

## 2018-08-01 NOTE — ED Triage Notes (Signed)
Pt reports having cough since Thursday with intermittent palpitations, shortness of breath, and inability to lay flat due to feeling as if she is smothering.  Swelling to feet and legs, but pt reports this is better than usual.  No hx of CHF.  Swelling also noted to shoulders.

## 2018-08-02 ENCOUNTER — Inpatient Hospital Stay (HOSPITAL_COMMUNITY): Payer: Medicare Other

## 2018-08-02 DIAGNOSIS — I5032 Chronic diastolic (congestive) heart failure: Secondary | ICD-10-CM | POA: Diagnosis present

## 2018-08-02 DIAGNOSIS — Z885 Allergy status to narcotic agent status: Secondary | ICD-10-CM | POA: Diagnosis not present

## 2018-08-02 DIAGNOSIS — Z79899 Other long term (current) drug therapy: Secondary | ICD-10-CM | POA: Diagnosis not present

## 2018-08-02 DIAGNOSIS — E876 Hypokalemia: Secondary | ICD-10-CM | POA: Diagnosis present

## 2018-08-02 DIAGNOSIS — I739 Peripheral vascular disease, unspecified: Secondary | ICD-10-CM

## 2018-08-02 DIAGNOSIS — Z7901 Long term (current) use of anticoagulants: Secondary | ICD-10-CM | POA: Diagnosis not present

## 2018-08-02 DIAGNOSIS — I5030 Unspecified diastolic (congestive) heart failure: Secondary | ICD-10-CM | POA: Diagnosis not present

## 2018-08-02 DIAGNOSIS — Z888 Allergy status to other drugs, medicaments and biological substances status: Secondary | ICD-10-CM | POA: Diagnosis not present

## 2018-08-02 DIAGNOSIS — M069 Rheumatoid arthritis, unspecified: Secondary | ICD-10-CM | POA: Diagnosis present

## 2018-08-02 DIAGNOSIS — Z882 Allergy status to sulfonamides status: Secondary | ICD-10-CM | POA: Diagnosis not present

## 2018-08-02 DIAGNOSIS — J449 Chronic obstructive pulmonary disease, unspecified: Secondary | ICD-10-CM | POA: Diagnosis present

## 2018-08-02 DIAGNOSIS — I480 Paroxysmal atrial fibrillation: Secondary | ICD-10-CM | POA: Diagnosis present

## 2018-08-02 DIAGNOSIS — I4891 Unspecified atrial fibrillation: Secondary | ICD-10-CM | POA: Diagnosis not present

## 2018-08-02 DIAGNOSIS — Z7952 Long term (current) use of systemic steroids: Secondary | ICD-10-CM | POA: Diagnosis not present

## 2018-08-02 DIAGNOSIS — I110 Hypertensive heart disease with heart failure: Secondary | ICD-10-CM | POA: Diagnosis present

## 2018-08-02 DIAGNOSIS — E782 Mixed hyperlipidemia: Secondary | ICD-10-CM | POA: Diagnosis present

## 2018-08-02 DIAGNOSIS — R0602 Shortness of breath: Secondary | ICD-10-CM | POA: Diagnosis not present

## 2018-08-02 DIAGNOSIS — Z880 Allergy status to penicillin: Secondary | ICD-10-CM | POA: Diagnosis not present

## 2018-08-02 DIAGNOSIS — I890 Lymphedema, not elsewhere classified: Secondary | ICD-10-CM | POA: Diagnosis present

## 2018-08-02 DIAGNOSIS — I10 Essential (primary) hypertension: Secondary | ICD-10-CM | POA: Diagnosis not present

## 2018-08-02 DIAGNOSIS — R002 Palpitations: Secondary | ICD-10-CM | POA: Diagnosis not present

## 2018-08-02 LAB — BASIC METABOLIC PANEL
Anion gap: 7 (ref 5–15)
BUN: 12 mg/dL (ref 8–23)
CHLORIDE: 104 mmol/L (ref 98–111)
CO2: 31 mmol/L (ref 22–32)
Calcium: 8.7 mg/dL — ABNORMAL LOW (ref 8.9–10.3)
Creatinine, Ser: 0.97 mg/dL (ref 0.44–1.00)
GFR calc non Af Amer: 53 mL/min — ABNORMAL LOW (ref >60)
Glucose, Bld: 119 mg/dL — ABNORMAL HIGH (ref 70–99)
Potassium: 3.2 mmol/L — ABNORMAL LOW (ref 3.5–5.1)
Sodium: 142 mmol/L (ref 135–145)

## 2018-08-02 LAB — ECHOCARDIOGRAM COMPLETE
Height: 66 in
WEIGHTICAEL: 3661.4 [oz_av]

## 2018-08-02 LAB — LIPID PANEL
Cholesterol: 160 mg/dL (ref 0–200)
HDL: 43 mg/dL (ref >40)
LDL CALC: 79 mg/dL (ref 0–99)
TRIGLYCERIDES: 189 mg/dL — AB (ref <150)
Total CHOL/HDL Ratio: 3.7 RATIO
VLDL: 38 mg/dL (ref 0–40)

## 2018-08-02 LAB — CBC
HCT: 41.8 % (ref 36.0–46.0)
Hemoglobin: 12.8 g/dL (ref 12.0–15.0)
MCH: 30.6 pg (ref 26.0–34.0)
MCHC: 30.6 g/dL (ref 30.0–36.0)
MCV: 100 fL (ref 78.0–100.0)
Platelets: 182 10*3/uL (ref 150–400)
RBC: 4.18 MIL/uL (ref 3.87–5.11)
RDW: 13.1 % (ref 11.5–15.5)
WBC: 8.3 10*3/uL (ref 4.0–10.5)

## 2018-08-02 LAB — MAGNESIUM: Magnesium: 1.7 mg/dL (ref 1.7–2.4)

## 2018-08-02 MED ORDER — MAGNESIUM SULFATE 2 GM/50ML IV SOLN
2.0000 g | Freq: Once | INTRAVENOUS | Status: AC
  Administered 2018-08-02: 2 g via INTRAVENOUS
  Filled 2018-08-02: qty 50

## 2018-08-02 MED ORDER — METOPROLOL SUCCINATE ER 25 MG PO TB24: 37.5000 mg | ORAL_TABLET | Freq: Two times a day (BID) | ORAL | 0 refills | Status: DC

## 2018-08-02 MED ORDER — POTASSIUM CHLORIDE CRYS ER 20 MEQ PO TBCR
40.0000 meq | EXTENDED_RELEASE_TABLET | Freq: Once | ORAL | Status: AC
  Administered 2018-08-02: 40 meq via ORAL
  Filled 2018-08-02: qty 2

## 2018-08-02 MED ORDER — LISINOPRIL 10 MG PO TABS: 10.0000 mg | ORAL_TABLET | Freq: Every day | ORAL | 0 refills | Status: DC

## 2018-08-02 MED ORDER — METOPROLOL SUCCINATE ER 25 MG PO TB24
37.5000 mg | ORAL_TABLET | Freq: Two times a day (BID) | ORAL | Status: DC
  Administered 2018-08-02: 37.5 mg via ORAL
  Filled 2018-08-02: qty 2

## 2018-08-02 MED ORDER — LISINOPRIL 10 MG PO TABS: 10.0000 mg | ORAL_TABLET | Freq: Every day | ORAL | Status: DC

## 2018-08-02 NOTE — Consult Note (Signed)
Cardiology Consultation:   Patient ID: Courtney Berry; [id number]; 07/13/1933   Admit date: 08/01/2018 Date of Consult: 08/02/2018  Primary Care Provider: Remus Loffler, PA-C Primary Cardiologist: Dr. Jonelle Sidle   Patient Profile:   Courtney Berry is an 84 y.o. female with a history of paroxysmal atrial fibrillation, lymphedema, and essential hypertension who is being seen today for the evaluation of recurrent atrial fibrillation with RVR at the request of Dr. Laural Benes.  History of Present Illness:   [name] was seen most recently in the office in July at which point she was symptomatically stable in terms of palpitations with paroxysmal atrial fibrillation.  She states that she has been under a lot of stress, her husband has been in the hospital for about 5 weeks, she has also had some problems with water pipes at her home.  She has been traveling with the help of her daughter to the hospital to see her husband, has been reporting dyspnea on exertion, this got worse on Thursday in association with more persistent sense of palpitations.  She is typically on Toprol-XL 25 mill grams the morning and 12.5 mg in the evening, took an extra half tablet without improvement in symptoms and ultimately came to the ER.  She was admitted with rapid atrial fibrillation, placed on intravenous diltiazem.  She has ruled out for ACS with normal troponin I levels.  Telemetry this morning looks to show sinus rhythm with frequent PACs and bursts of brief atrial fibrillation.  Past Medical History:  Diagnosis Date  . COPD (chronic obstructive pulmonary disease) (HCC)   . Diverticulosis   . Essential hypertension, benign   . History of chest pain    Low risk Cardiolite 2010  . Lymphedema   . Mixed hyperlipidemia   . PAF (paroxysmal atrial fibrillation) (HCC)   . Rheumatoid arthritis [facility])     Past Surgical History:  Procedure Laterality Date  . TOTAL ABDOMINAL HYSTERECTOMY  1969      Inpatient Medications: Scheduled Meds: . apixaban  5 mg Oral BID  . lisinopril  20 mg Oral Daily  . metoprolol succinate  25 mg Oral BID  . potassium chloride  40 mEq Oral Once  . predniSONE  10 mg Oral Q breakfast  . umeclidinium-vilanterol  1 puff Inhalation Daily   Continuous Infusions: . diltiazem (CARDIZEM) infusion 5 mg/hr (08/02/18 0332)  . magnesium sulfate 1 - 4 g bolus IVPB     PRN Meds: acetaminophen, ipratropium-albuterol, ondansetron (ZOFRAN) IV, traZODone  Allergies:    Allergies  Allergen Reactions  . Darvocet [Propoxyphene N-Acetaminophen] Other (See Comments)    unknown  . Nitrofurantoin Monohyd Macro Other (See Comments)    unknown  . Plavix [Clopidogrel Bisulfate] Swelling    Face swells  . Sulfa Antibiotics Other (See Comments)    "face got puffy"  . Penicillins Rash    Has patient had a PCN reaction causing immediate rash, facial/tongue/throat swelling, SOB or lightheadedness with hypotension: Yes Has patient had a PCN reaction causing severe rash involving mucus membranes or skin necrosis: No Has patient had a PCN reaction that required hospitalization: No Has patient had a PCN reaction occurring within the last 10 years: No If all of the above answers are "NO", then may proceed with Cephalosporin use.     Social History:   Social History   Socioeconomic History  . Marital status: Married    Spouse name: Not on file  . Number of children: Not on file  .  Years of education: Not on file  . Highest education level: Not on file  Occupational History  . Occupation: Retired  Engineer, production  . Financial resource strain: Not hard at all  . Food insecurity:    Worry: Never true    Inability: Never true  . Transportation needs:    Medical: No    Non-medical: No  Tobacco Use  . Smoking status: Never Smoker  . Smokeless tobacco: Never Used  . Tobacco comment: Tobacco use-no  Substance and Sexual Activity  . Alcohol use: No    Alcohol/week: 0.0  standard drinks  . Drug use: No  . Sexual activity: Not on file  Lifestyle  . Physical activity:    Days per week: 0 days    Minutes per session: 0 min  . Stress: Only a little  Relationships  . Social connections:    Talks on phone: Twice a week    Gets together: Twice a week    Attends religious service: More than 4 times per year    Active member of club or organization: Yes    Attends meetings of clubs or organizations: 1 to 4 times per year    Relationship status: Married  . Intimate partner violence:    Fear of current or ex partner: No    Emotionally abused: No    Physically abused: No    Forced sexual activity: No  Other Topics Concern  . Not on file  Social History Narrative  . Not on file    Family History:   The patient's family history includes Cancer in her brother, father, and mother; Heart disease in her sister.  ROS:  Please see the history of present illness.  All other ROS reviewed and negative.     Physical Exam/Data:   Vitals:   08/02/18 0400 08/02/18 0430 08/02/18 0500 08/02/18 0530  BP: (!) 139/57 137/81 115/71 (!) 134/47  Pulse: 62 69 64 60  Resp: 18 (!) 22 (!) 23 (!) 24  Temp:   97.8 F (36.6 C)   TempSrc:   Oral   SpO2: 94% 93% 94% 92%  Weight:   103.8 kg   Height:        Intake/Output Summary (Last 24 hours) at 08/02/2018 0835 Last data filed at 08/02/2018 0548 Gross per 24 hour  Intake 347.53 ml  Output -  Net 347.53 ml   Filed Weights   08/01/18 1443 08/02/18 0000 08/02/18 0500  Weight: 104 kg 103.8 kg 103.8 kg   Body mass index is 36.94 kg/m.   Gen: Patient appears comfortable at rest. HEENT: Conjunctiva and lids normal, oropharynx clear. Neck: Supple, no elevated JVP or carotid bruits, no thyromegaly. Lungs: Clear to auscultation, nonlabored breathing at rest. Cardiac: Regular rate and rhythm with ectopy, no S3, soft systolic murmur, no pericardial rub. Abdomen: Soft, nontender, bowel sounds present. Extremities: Stable  appearing edema/lymphedema, distal pulses 2+. Skin: Warm and dry. Musculoskeletal: No kyphosis. Neuropsychiatric: Alert and oriented x3, affect grossly appropriate.  EKG:  I personally reviewed the tracing from 08/01/2018 which shows a sinus rhythm with frequent PACs and PVC, nonspecific ST-T changes.  Telemetry:  I personally reviewed telemetry which shows probable sinus rhythm with frequent PACs.  Relevant CV Studies:  Echocardiogram 11/19/2016: Study Conclusions  - Left ventricle: The cavity size was normal. Wall thickness was   increased in a pattern of mild LVH. Systolic function was normal.   The estimated ejection fraction was in the range of 60% to 65%.  Doppler parameters are consistent with abnormal left ventricular   relaxation (grade 1 diastolic dysfunction).  Laboratory Data:  Chemistry Recent Labs  Lab 08/01/18 1021 08/02/18 0416  NA 143 142  K 3.5 3.2*  CL 105 104  CO2 29 31  GLUCOSE 154* 119*  BUN 13 12  CREATININE 1.00 0.97  CALCIUM 9.1 8.7*  GFRNONAA 51* 53*  GFRAA 59* >60  ANIONGAP 9 7    Recent Labs  Lab 08/01/18 1021  PROT 6.8  ALBUMIN 3.3*  AST 14*  ALT 14  ALKPHOS 54  BILITOT 1.3*   Hematology Recent Labs  Lab 08/01/18 1021 08/02/18 0416  WBC 9.0 8.3  RBC 4.58 4.18  HGB 14.2 12.8  HCT 45.9 41.8  MCV 100.2* 100.0  MCH 31.0 30.6  MCHC 30.9 30.6  RDW 13.0 13.1  PLT 183 182   Cardiac Enzymes Recent Labs  Lab 08/01/18 1021 08/01/18 1510 08/01/18 2013  TROPONINI <0.03 <0.03 <0.03   No results for input(s): TROPIPOC in the last 168 hours.  BNP Recent Labs  Lab 08/01/18 1021 08/01/18 1510  BNP 134.0* 125.0*    Radiology/Studies:  Dg Chest 2 View  Result Date: 08/01/2018 CLINICAL DATA:  Cough and shortness of breath since Thursday. EXAM: CHEST - 2 VIEW COMPARISON:  Chest x-ray dated 06/30/2018 and chest x-ray dated 11/13/2017. FINDINGS: Heart size is upper normal, stable. Lungs are clear. No pleural effusion or  pneumothorax seen. No acute or suspicious osseous finding. Chronic elevation of the RIGHT hemidiaphragm. IMPRESSION: No active cardiopulmonary disease. No evidence of pneumonia or pulmonary edema. Electronically Signed   By: Bary Richard M.D.   On: 08/01/2018 10:58    Assessment and Plan:   1.  Paroxysmal atrial fibrillation, recent recurrence with RVR and associated shortness of breath.  Chest x-ray does not show overt heart failure.  Echocardiogram as of January 2018 was in the range of 60 to 65% with mild diastolic dysfunction mild LVH.  She continues on Eliquis for stroke prophylaxis at baseline, states that she has been compliant with Toprol-XL as an outpatient.  Heart rate is controlled this morning and she looks to be in sinus rhythm actually with frequent PACs.  2.  Essential hypertension, blood pressure is adequately controlled at this time.  She is also on lisinopril.  3.  Chronic lymphedema.  4.  COPD by history.  Would discontinue intravenous diltiazem.  Follow-up echocardiogram is pending for reassessment of LVEF.  Plan at this time is to increase Toprol-XL to 37.5 mg twice daily over her previous outpatient baseline.  May need to consider initiation of flecainide for better rhythm suppression, although we would need to pursue a follow-up ischemic evaluation prior to this, and this can be arranged as an outpatient.  If her LVEF is stable and she is able to ambulate with adequate heart rate control, she may be potentially discharged home later this afternoon.  We would need to see her back in the office within the next few weeks to discuss possible antiarrhythmic treatment.   Signed, Nona Dell, MD  08/02/2018 8:35 AM

## 2018-08-02 NOTE — Discharge Instructions (Signed)
·   Your lab tests, ekg and chest xray are ok today. We recommend increasing your metoprolol dose to 1.5 tablets (37.5 mg) twice daily.     Your echocardiogram test came back okay it did not show much change from when you had it done in 2018.  Discuss further results with Dr. Diona Browner on your follow-up visit with cardiology.  Please call your cardiology office to get an appointment later this week to follow up.    Seek medical care or return to ER if symptoms come back, worsen or new problems develop.    Follow with Primary MD  Remus Loffler, PA-C  and other consultant's as instructed your Hospitalist MD  Please get a complete blood count and chemistry panel checked by your Primary MD at your next visit, and again as instructed by your Primary MD.  Get Medicines reviewed and adjusted: Please take all your medications with you for your next visit with your Primary MD  Laboratory/radiological data: Please request your Primary MD to go over all hospital tests and procedure/radiological results at the follow up, please ask your Primary MD to get all Hospital records sent to his/her office.  In some cases, they will be blood work, cultures and biopsy results pending at the time of your discharge. Please request that your primary care M.D. follows up on these results.  Also Note the following: If you experience worsening of your admission symptoms, develop shortness of breath, life threatening emergency, suicidal or homicidal thoughts you must seek medical attention immediately by calling 911 or calling your MD immediately  if symptoms less severe.  You must read complete instructions/literature along with all the possible adverse reactions/side effects for all the Medicines you take and that have been prescribed to you. Take any new Medicines after you have completely understood and accpet all the possible adverse reactions/side effects.   Do not drive when taking Pain medications or sleeping  medications (Benzodaizepines)  Do not take more than prescribed Pain, Sleep and Anxiety Medications. It is not advisable to combine anxiety,sleep and pain medications without talking with your primary care practitioner  Special Instructions: If you have smoked or chewed Tobacco  in the last 2 yrs please stop smoking, stop any regular Alcohol  and or any Recreational drug use.  Wear Seat belts while driving.  Please note: You were cared for by a hospitalist during your hospital stay. Once you are discharged, your primary care physician will handle any further medical issues. Please note that NO REFILLS for any discharge medications will be authorized once you are discharged, as it is imperative that you return to your primary care physician (or establish a relationship with a primary care physician if you do not have one) for your post hospital discharge needs so that they can reassess your need for medications and monitor your lab values.

## 2018-08-02 NOTE — Care Management Important Message (Signed)
Important Message  Patient Details  Name: Courtney Berry MRN: [id number] Date of Birth: 1933/08/03   Medicare Important Message Given:  Yes    Renie Ora 08/02/2018, 12:16 PM

## 2018-08-02 NOTE — Progress Notes (Signed)
*  PRELIMINARY RESULTS* Echocardiogram 2D Echocardiogram has been performed.  Courtney Berry 08/02/2018, 3:09 PM

## 2018-08-02 NOTE — Progress Notes (Signed)
PROGRESS NOTE  Courtney Berry  JKK:[id number]  DOB: 01/04/1933  DOA: 08/01/2018 PCP: Remus Loffler, PA-C   Brief Admission Hx: Courtney Berry is a 84 y.o. female with known paroxysmal atrial fibrillation presented to the ED with 3 days of progressive shortness of breath and palpitations and inability to lie flat and feeling of smothering when lying down.  She was admitted with Afib RVR and started on IV diltiazem infusion.   MDM/Assessment & Plan:   1. Atrial fibrillation with rapid ventricular response-the patient has a much better controlled HR on IV diltiazem.  Unfortunately with ambulation her HR goes back up to 120-130s.  Her TSH WNL.  Repleting electrolytes this morning.  Cardiology consulted.  2. Hypokalemia - replacing magnesium and potassium.   3. Essential hypertension-we are planning to resume her home oral medications today unless the [facility] team has a different plan.  Resume metoprolol XL 25 mg twice daily and get her off the IV diltiazem infusion. 4. Chronic anticoagulation-patient has been on apixaban 5 mg twice daily which will be continued. 5. Chronic diastolic congestive heart failure- she does not seem to be an acute exacerbation at this time.  Her chest x-ray did not show pulmonary edema.  We will continue to monitor closely.  2D echocardiogram has been ordered. 6. COPD-resume home respiratory medications. 7. Chronic lymphedema of lower extremities-patient reports that the lymphedema has actually been better recently than it has in the past.  Follow clinically. 8. Dyslipidemia-morning lipid panel ordered. 9. Rheumatoid arthritis-patient is fairly dependent on prednisone.  Will continue daily prednisone to avoid adrenal insufficiency crisis. 10. Shortness of breath-suspect this is secondary to A. fib with RVR.  Monitor in stepdown unit.  Chest x-ray did not show acute findings.  Follow.  Supplemental oxygen as needed.  DVT Prophylaxis: Apixaban Code Status:  Full Family Communication: Patient's daughter at bedside.  Disposition Plan: The patient needs to be in the hospital for IV rate control medications.  The patient needs a stepdown unit because she is on a titratable Cardizem infusion that requires close cardiac monitoring and close vital sign monitoring.  Consultants:  cardiology  Subjective: Pt was ambulating to bathroom this morning and HR went to 130s.    Objective: Vitals:   08/02/18 0400 08/02/18 0430 08/02/18 0500 08/02/18 0530  BP: (!) 139/57 137/81 115/71 (!) 134/47  Pulse: 62 69 64 60  Resp: 18 (!) 22 (!) 23 (!) 24  Temp:      TempSrc:      SpO2: 94% 93% 94% 92%  Weight:      Height:        Intake/Output Summary (Last 24 hours) at 08/02/2018 0604 Last data filed at 08/02/2018 0548 Gross per 24 hour  Intake 347.53 ml  Output -  Net 347.53 ml   Filed Weights   08/01/18 0953 08/01/18 1443 08/02/18 0000  Weight: 106.6 kg 104 kg 103.8 kg   REVIEW OF SYSTEMS  As per history otherwise all reviewed and reported negative  Exam:  General exam: awake, alert, NAD, cooperative.   Respiratory system: BBS CTA. No increased work of breathing. Cardiovascular system: S1 & S2 heard, irregularly irregular. No JVD.  Gastrointestinal system: Abdomen is nondistended, soft and nontender. Normal bowel sounds heard. Central nervous system: Alert and oriented. No focal neurological deficits. Extremities: chronic lymphedema BLEs.  Data Reviewed: Basic Metabolic Panel: Recent Labs  Lab 08/01/18 1021 08/02/18 0416  NA 143 142  K 3.5 3.2*  CL 105 104  CO2 29 31  GLUCOSE 154* 119*  BUN 13 12  CREATININE 1.00 0.97  CALCIUM 9.1 8.7*  MG  --  1.7   Liver Function Tests: Recent Labs  Lab 08/01/18 1021  AST 14*  ALT 14  ALKPHOS 54  BILITOT 1.3*  PROT 6.8  ALBUMIN 3.3*   No results for input(s): LIPASE, AMYLASE in the last 168 hours. No results for input(s): AMMONIA in the last 168 hours. CBC: Recent Labs  Lab  08/01/18 1021 08/02/18 0416  WBC 9.0 8.3  HGB 14.2 12.8  HCT 45.9 41.8  MCV 100.2* 100.0  PLT 183 182   Cardiac Enzymes: Recent Labs  Lab 08/01/18 1021 08/01/18 1510 08/01/18 2013  TROPONINI <0.03 <0.03 <0.03   CBG (last 3)  No results for input(s): GLUCAP in the last 72 hours. No results found for this or any previous visit (from the past 240 hour(s)).   Studies: Dg Chest 2 View  Result Date: 08/01/2018 CLINICAL DATA:  Cough and shortness of breath since Thursday. EXAM: CHEST - 2 VIEW COMPARISON:  Chest x-ray dated 06/30/2018 and chest x-ray dated 11/13/2017. FINDINGS: Heart size is upper normal, stable. Lungs are clear. No pleural effusion or pneumothorax seen. No acute or suspicious osseous finding. Chronic elevation of the RIGHT hemidiaphragm. IMPRESSION: No active cardiopulmonary disease. No evidence of pneumonia or pulmonary edema. Electronically Signed   By: Bary Richard M.D.   On: 08/01/2018 10:58   Scheduled Meds: . apixaban  5 mg Oral BID  . lisinopril  20 mg Oral Daily  . metoprolol succinate  25 mg Oral BID  . potassium chloride  40 mEq Oral Once  . predniSONE  10 mg Oral Q breakfast  . umeclidinium-vilanterol  1 puff Inhalation Daily   Continuous Infusions: . diltiazem (CARDIZEM) infusion 5 mg/hr (08/02/18 0332)  . magnesium sulfate 1 - 4 g bolus IVPB      Principal Problem:   Atrial fibrillation with RVR (HCC) Active Problems:   Dyslipidemia   Essential hypertension   Shortness of breath   Palpitations   Chronic anticoagulation   Rheumatoid arthritis (HCC)   Peripheral vascular disease (HCC)   Pain in both lower legs   Lymphedema of both lower extremities   PAF (paroxysmal atrial fibrillation) (HCC)   COPD (chronic obstructive pulmonary disease) (HCC)  Critical Care Time spent: 32 mins  Standley Dakins, MD, FAAFP [facility] Pager [phone]  If 7PM-7AM, please contact night-coverage [email or web address] Password [email or web address] 08/02/2018, 6:04 AM     LOS: 0 days

## 2018-08-02 NOTE — Discharge Summary (Signed)
Physician Discharge Summary  Courtney Berry TDH:[id number] DOB: [birth year] DOA: 08/01/2018  PCP: Remus Loffler, PA-C Cardiology:  Kerry Kass date: 08/01/2018 Discharge date: 08/02/2018  Admitted From: HOME Disposition: HOME  Recommendations for Outpatient Follow-up:  1. Follow up with cardiology later this week for recheck  Discharge Condition: STABLE   CODE STATUS: FULL    Brief Hospitalization Summary: Please see all hospital notes, images, labs for full details of the hospitalization.  HPI: Courtney Berry is a 84 y.o. female with known paroxysmal atrial fibrillation presented to the ED with 3 days of progressive shortness of breath and palpitations and inability to lie flat and feeling of smothering when lying down.  According to the patient, her symptoms first started after she had walked a long distance when she had to go and see her husband in the hospital.  She has become deconditioned and usually does not ambulate for long distances.  Her palpitations started shortly after that.  She says she did not have chest pain at that time but was short of breath.  The patient is chronically anticoagulated with apixaban 5 mg twice daily.  The patient had increased her regularly dose of 25 mg metoprolol XL to 1 full tablet twice daily with only minimal improvement/control in these episodes of palpitations that last several seconds up to several minutes.  The patient reports swelling in the feet and legs which remains chronic due to her lymphedema.  She denies history of congestive heart failure.  Her most recent echocardiogram from 11/19/16 showed mild LVH with EF 60-65%, grade 1 diastolic dysfunction.  She is followed closely by the cardiology service and was last seen in July 2019.  The patient denies chest pain.  She does report heaviness in the chest.  She has been having a nonproductive cough but no fever chills or wheezing.  The patient was recently treated for a bout of cellulitis in the  lower legs which has resolved.  ED course: The patient was seen in the emergency department and noted to have several intermittent episodes of atrial fibrillation.   Unfortunately just prior to being sent home she went into A. fib with RVR.  She had to be started on an IV diltiazem infusion after having been given 20 mg of IV Cardizem with only minimal improvement in the heart rate.  She is being admitted to the stepdown unit for a titrated Cardizem infusion for rate control.  Her labs and chest x-ray were essentially unremarkable.  Her initial troponin was less than 0.03.  1. Atrial fibrillation with rapid ventricular response-the patient has a much better controlled HR on IV diltiazem.  The patient was seen by cardiology and was taken off the IV diltiazem infusion and her metoprolol XL was increased to 37.5 mg twice daily.  This seemed to control her rate very well.  She was ambulating and her heart rate remained controlled.  Her blood pressures are stabilized.  She is going to be discharged home to follow-up with cardiology later this week as they have requested. 2. Hypokalemia - replaced magnesium and potassium.   3. Essential hypertension- her metoprolol has been increased to 37.5 mg twice daily and her lisinopril has been reduced to 10 mg daily due to soft blood pressures.. 4. Chronic anticoagulation-patient has been on apixaban 5 mg twice daily which will be continued. 5. Chronic diastolic congestive heart failure- she does not seem to be an acute exacerbation at this time. Her chest x-ray did not show  pulmonary edema. We will continue to monitor closely. 2D echocardiogram has been ordered and the results are noted below. 6. COPD-resume home respiratory medications. 7. Chronic lymphedema of lower extremities-patient reports that the lymphedema has actually been better recently than it has in the past. Follow clinically. 8. Dyslipidemia-morning lipid panel ordered. 9. Rheumatoid  arthritis-patient is fairly dependent on prednisone. Will continue daily prednisone to avoid adrenal insufficiency crisis. 10. Shortness of breath-suspect this is secondary to A. fib with RVR. Monitor in stepdown unit. Chest x-ray did not show acute findings. Follow. Supplemental oxygen as needed.  DVT Prophylaxis:Apixaban Code Status:Full Family Communication:Patient's daughter at bedside. Disposition Plan:The patient needs to be in the hospital for IV rate control medications.The patient needs a stepdown unit because she is on a titratable Cardizem infusionthatrequires close cardiac monitoring and close vital sign monitoring.  Consultants:  cardiology  Echocardiogram 08/02/18 Study Conclusions  - Left ventricle: The cavity size was normal. Wall thickness was  increased increased in a pattern of mild to moderate LVH.   Systolic function was normal. The estimated ejection fraction was  in the range of 60% to 65%. Wall motion was normal; there were no  regional wall motion abnormalities. Doppler parameters are  consistent with abnormal left ventricular relaxation (grade 1  diastolic dysfunction). - Aortic valve: Moderately calcified annulus. Trileaflet. - Mitral valve: Mildly calcified annulus. There was trivial  regurgitation. - Right atrium: Central venous pressure (est): 3 mm Hg. - Atrial septum: No defect or patent foramen ovale was identified. - Tricuspid valve: There was trivial regurgitation. - Pulmonary arteries: Systolic pressure could not be accurately estimated. - Pericardium, extracardiac: There was no pericardial effusion.   Discharge Diagnoses:  Principal Problem:   Atrial fibrillation with RVR (HCC) Active Problems:   Dyslipidemia   Essential hypertension   Shortness of breath   Palpitations   Chronic anticoagulation   Rheumatoid arthritis (HCC)   Peripheral vascular disease (HCC)   Pain in both lower legs   Lymphedema of both lower extremities    PAF (paroxysmal atrial fibrillation) (HCC)   COPD (chronic obstructive pulmonary disease) (HCC)    Discharge Instructions: Discharge Instructions    Call MD for:  difficulty breathing, headache or visual disturbances   Complete by:  As directed    Call MD for:  extreme fatigue   Complete by:  As directed    Call MD for:  hives   Complete by:  As directed    Call MD for:  persistant dizziness or light-headedness   Complete by:  As directed    Call MD for:  severe uncontrolled pain   Complete by:  As directed    Diet - low sodium heart healthy   Complete by:  As directed    Increase activity slowly   Complete by:  As directed      Allergies as of 08/02/2018      Reactions   Darvocet [propoxyphene N-acetaminophen] Other (See Comments)   unknown   Nitrofurantoin Monohyd Macro Other (See Comments)   unknown   Plavix [clopidogrel Bisulfate] Swelling   Face swells   Sulfa Antibiotics Other (See Comments)   "face got puffy"   Penicillins Rash   Has patient had a PCN reaction causing immediate rash, facial/tongue/throat swelling, SOB or lightheadedness with hypotension: Yes Has patient had a PCN reaction causing severe rash involving mucus membranes or skin necrosis: No Has patient had a PCN reaction that required hospitalization: No Has patient had a PCN reaction occurring within the  last 10 years: No If all of the above answers are "NO", then may proceed with Cephalosporin use.      Medication List    TAKE these medications   acetaminophen 500 MG tablet Commonly known as:  TYLENOL Take 1,000 mg by mouth every 6 (six) hours as needed for moderate pain.   ANORO ELLIPTA 62.5-25 MCG/INH Aepb Generic drug:  umeclidinium-vilanterol Inhale 1 puff into the lungs daily.   ELIQUIS 5 MG Tabs tablet Generic drug:  apixaban TAKE ONE TABLET BY MOUTH TWICE DAILY   lisinopril 10 MG tablet Commonly known as:  PRINIVIL,ZESTRIL Take 1 tablet (10 mg total) by mouth daily. What changed:     medication strength  See the new instructions.   metoprolol succinate 25 MG 24 hr tablet Commonly known as:  TOPROL-XL Take 1.5 tablets (37.5 mg total) by mouth 2 (two) times daily. What changed:  See the new instructions.   predniSONE 10 MG tablet Commonly known as:  DELTASONE TAKE 2 TABLETS EVERY MORNING WITH BREAKFAST What changed:    how much to take  how to take this  when to take this  reasons to take this  additional instructions   PROAIR HFA 108 (90 Base) MCG/ACT inhaler Generic drug:  albuterol Inhale 1-2 puffs into the lungs every 6 (six) hours as needed for wheezing or shortness of breath.   VITAMIN D (CHOLECALCIFEROL) PO Take 1 tablet by mouth daily.      Follow-up Information    Schedule an appointment as soon as possible for a visit  with Jonelle Sidle, MD.   Specialty:  Cardiology Why:  call in the morning for an office visit early this week Contact information: [address] [place] [address] [phone]        Remus Loffler, PA-C. Schedule an appointment as soon as possible for a visit in 1 week(s).   Specialties:  Physician Assistant, Family Medicine Why:  Hospital Follow Up  Contact information: [address] [place] [address] [phone]          Allergies  Allergen Reactions  . Darvocet [Propoxyphene N-Acetaminophen] Other (See Comments)    unknown  . Nitrofurantoin Monohyd Macro Other (See Comments)    unknown  . Plavix [Clopidogrel Bisulfate] Swelling    Face swells  . Sulfa Antibiotics Other (See Comments)    "face got puffy"  . Penicillins Rash    Has patient had a PCN reaction causing immediate rash, facial/tongue/throat swelling, SOB or lightheadedness with hypotension: Yes Has patient had a PCN reaction causing severe rash involving mucus membranes or skin necrosis: No Has patient had a PCN reaction that required hospitalization: No Has patient had a PCN reaction occurring within the last 10  years: No If all of the above answers are "NO", then may proceed with Cephalosporin use.    Allergies as of 08/02/2018      Reactions   Darvocet [propoxyphene N-acetaminophen] Other (See Comments)   unknown   Nitrofurantoin Monohyd Macro Other (See Comments)   unknown   Plavix [clopidogrel Bisulfate] Swelling   Face swells   Sulfa Antibiotics Other (See Comments)   "face got puffy"   Penicillins Rash   Has patient had a PCN reaction causing immediate rash, facial/tongue/throat swelling, SOB or lightheadedness with hypotension: Yes Has patient had a PCN reaction causing severe rash involving mucus membranes or skin necrosis: No Has patient had a PCN reaction that required hospitalization: No Has patient had a PCN reaction occurring  within the last 10 years: No If all of the above answers are "NO", then may proceed with Cephalosporin use.      Medication List    TAKE these medications   acetaminophen 500 MG tablet Commonly known as:  TYLENOL Take 1,000 mg by mouth every 6 (six) hours as needed for moderate pain.   ANORO ELLIPTA 62.5-25 MCG/INH Aepb Generic drug:  umeclidinium-vilanterol Inhale 1 puff into the lungs daily.   ELIQUIS 5 MG Tabs tablet Generic drug:  apixaban TAKE ONE TABLET BY MOUTH TWICE DAILY   lisinopril 10 MG tablet Commonly known as:  PRINIVIL,ZESTRIL Take 1 tablet (10 mg total) by mouth daily. What changed:    medication strength  See the new instructions.   metoprolol succinate 25 MG 24 hr tablet Commonly known as:  TOPROL-XL Take 1.5 tablets (37.5 mg total) by mouth 2 (two) times daily. What changed:  See the new instructions.   predniSONE 10 MG tablet Commonly known as:  DELTASONE TAKE 2 TABLETS EVERY MORNING WITH BREAKFAST What changed:    how much to take  how to take this  when to take this  reasons to take this  additional instructions   PROAIR HFA 108 (90 Base) MCG/ACT inhaler Generic drug:  albuterol Inhale 1-2 puffs into  the lungs every 6 (six) hours as needed for wheezing or shortness of breath.   VITAMIN D (CHOLECALCIFEROL) PO Take 1 tablet by mouth daily.       Procedures/Studies: Dg Chest 2 View  Result Date: 08/01/2018 CLINICAL DATA:  Cough and shortness of breath since Thursday. EXAM: CHEST - 2 VIEW COMPARISON:  Chest x-ray dated 06/30/2018 and chest x-ray dated 11/13/2017. FINDINGS: Heart size is upper normal, stable. Lungs are clear. No pleural effusion or pneumothorax seen. No acute or suspicious osseous finding. Chronic elevation of the RIGHT hemidiaphragm. IMPRESSION: No active cardiopulmonary disease. No evidence of pneumonia or pulmonary edema. Electronically Signed   By: Bary Richard M.D.   On: 08/01/2018 10:58      Subjective: The patient is no longer having palpitations.  She feels fine and she would like to go home.  She has been ambulating in the room and not having any problems with palpitations.  Her shortness of breath has resolved.  Discharge Exam: Vitals:   08/02/18 1200 08/02/18 1300  BP: (!) 127/41 (!) 128/53  Pulse: 65 65  Resp: 18 (!) 25  Temp:    SpO2: 93% 94%   Vitals:   08/02/18 1100 08/02/18 1143 08/02/18 1200 08/02/18 1300  BP: (!) 172/138  (!) 127/41 (!) 128/53  Pulse: 62  65 65  Resp: (!) 28  18 (!) 25  Temp:  98.2 F (36.8 C)    TempSrc:      SpO2: 93%  93% 94%  Weight:      Height:       General exam: awake, alert, NAD, cooperative.   Respiratory system: BBS CTA. No increased work of breathing. Cardiovascular system: S1 & S2 heard, irregularly irregular. No JVD.  Gastrointestinal system: Abdomen is nondistended, soft and nontender. Normal bowel sounds heard. Central nervous system: Alert and oriented. No focal neurological deficits. Extremities: chronic lymphedema BLEs.   The results of significant diagnostics from this hospitalization (including imaging, microbiology, ancillary and laboratory) are listed below for reference.     Microbiology: No  results found for this or any previous visit (from the past 240 hour(s)).   Labs: BNP (last 3 results) Recent Labs  11/13/17 2018 08/01/18 1021 08/01/18 1510  BNP 29.0 134.0* 125.0*   Basic Metabolic Panel: Recent Labs  Lab 08/01/18 1021 08/02/18 0416  NA 143 142  K 3.5 3.2*  CL 105 104  CO2 29 31  GLUCOSE 154* 119*  BUN 13 12  CREATININE 1.00 0.97  CALCIUM 9.1 8.7*  MG  --  1.7   Liver Function Tests: Recent Labs  Lab 08/01/18 1021  AST 14*  ALT 14  ALKPHOS 54  BILITOT 1.3*  PROT 6.8  ALBUMIN 3.3*   No results for input(s): LIPASE, AMYLASE in the last 168 hours. No results for input(s): AMMONIA in the last 168 hours. CBC: Recent Labs  Lab 08/01/18 1021 08/02/18 0416  WBC 9.0 8.3  HGB 14.2 12.8  HCT 45.9 41.8  MCV 100.2* 100.0  PLT 183 182   Cardiac Enzymes: Recent Labs  Lab 08/01/18 1021 08/01/18 1510 08/01/18 2013  TROPONINI <0.03 <0.03 <0.03   BNP: Invalid input(s): POCBNP CBG: No results for input(s): GLUCAP in the last 168 hours. D-Dimer No results for input(s): DDIMER in the last 72 hours. Hgb A1c No results for input(s): HGBA1C in the last 72 hours. Lipid Profile Recent Labs    08/02/18 0417  CHOL 160  HDL 43  LDLCALC 79  TRIG 189*  CHOLHDL 3.7   Thyroid function studies Recent Labs    08/01/18 1004  TSH 1.004   Anemia work up No results for input(s): VITAMINB12, FOLATE, FERRITIN, TIBC, IRON, RETICCTPCT in the last 72 hours. Urinalysis    Component Value Date/Time   COLORURINE YELLOW 10/29/2016 1246   APPEARANCEUR Clear 07/21/2017 1440   LABSPEC 1.025 10/29/2016 1246   PHURINE 5.5 10/29/2016 1246   GLUCOSEU Negative 07/21/2017 1440   HGBUR NEGATIVE 10/29/2016 1246   BILIRUBINUR Negative 07/21/2017 1440   KETONESUR NEGATIVE 10/29/2016 1246   PROTEINUR Negative 07/21/2017 1440   PROTEINUR NEGATIVE 10/29/2016 1246   NITRITE Negative 07/21/2017 1440   NITRITE NEGATIVE 10/29/2016 1246   LEUKOCYTESUR 1+ (A)  07/21/2017 1440   Sepsis Labs Invalid input(s): PROCALCITONIN,  WBC,  LACTICIDVEN Microbiology No results found for this or any previous visit (from the past 240 hour(s)).  SIGNED:  Standley Dakins, MD  [facility] 08/02/2018, 4:55 PM Pager [phone]  If 7PM-7AM, please contact night-coverage [email or web address] Password [email or web address]

## 2018-08-02 NOTE — Progress Notes (Signed)
Pts HR increased 120-130's while ambulating to Bathroom. willl continue to monitor pt

## 2018-08-03 LAB — MRSA PCR SCREENING: MRSA BY PCR: POSITIVE — AB

## 2018-08-17 NOTE — Progress Notes (Signed)
Cardiology Office Note  Date: 08/18/2018   ID: INIS BORNEMAN, DOB Feb 28, 1933, MRN [id number]  PCP: Remus Loffler, PA-C  Primary Cardiologist: Nona Dell, MD   Chief Complaint  Patient presents with  . PAF    History of Present Illness: Courtney Berry is an 84 y.o. female last seen in the office in July.  More recently she was seen as an inpatient consult in September with recurrent atrial fibrillation with RVR.  She converted spontaneously to sinus rhythm with heart rate control and intravenous diltiazem.  Subsequently Toprol-XL dose was increased.  Follow-up echocardiogram revealed stable LVEF at 60 to 65% with mild to moderate LVH and grade 1 diastolic dysfunction.  She presents today reporting no progressive shortness of breath or palpitations, no chest pain.  She has been tolerating Toprol-XL at increased dose.  Today we talked about arranging follow-up ischemic testing in case we need to consider antiarrhythmic therapy.  Flecainide might be a reasonable option.  I personally reviewed her ECG today which shows sinus bradycardia.  Past Medical History:  Diagnosis Date  . COPD (chronic obstructive pulmonary disease) (HCC)   . Diverticulosis   . Essential hypertension, benign   . History of chest pain    Low risk Cardiolite 2010  . Lymphedema   . Mixed hyperlipidemia   . PAF (paroxysmal atrial fibrillation) (HCC)   . Rheumatoid arthritis [facility])     Past Surgical History:  Procedure Laterality Date  . TOTAL ABDOMINAL HYSTERECTOMY  1969    Current Outpatient Medications  Medication Sig Dispense Refill  . acetaminophen (TYLENOL) 500 MG tablet Take 1,000 mg by mouth every 6 (six) hours as needed for moderate pain.    Marland Kitchen ELIQUIS 5 MG TABS tablet TAKE ONE TABLET BY MOUTH TWICE DAILY 60 tablet 3  . lisinopril (PRINIVIL,ZESTRIL) 10 MG tablet Take 1 tablet (10 mg total) by mouth daily. 30 tablet 0  . metoprolol succinate (TOPROL-XL) 25 MG 24 hr tablet Take 1.5 tablets  (37.5 mg total) by mouth 2 (two) times daily. 90 tablet 0  . PROAIR HFA 108 (90 Base) MCG/ACT inhaler Inhale 1-2 puffs into the lungs every 6 (six) hours as needed for wheezing or shortness of breath.     . umeclidinium-vilanterol (ANORO ELLIPTA) 62.5-25 MCG/INH AEPB Inhale 1 puff into the lungs daily.     No current facility-administered medications for this visit.    Allergies:  Darvocet [propoxyphene n-acetaminophen]; Nitrofurantoin monohyd macro; Plavix [clopidogrel bisulfate]; Sulfa antibiotics; and Penicillins   Social History: The patient  reports that she has never smoked. She has never used smokeless tobacco. She reports that she does not drink alcohol or use drugs.   ROS:  Please see the history of present illness. Otherwise, complete review of systems is positive for none.  All other systems are reviewed and negative.   Physical Exam: VS:  BP 140/62   Pulse (!) 57   Ht 5\' 6"  (1.676 m)   Wt 234 lb 3.2 oz (106.2 kg)   SpO2 97%   BMI 37.80 kg/m , BMI Body mass index is 37.8 kg/m.  Wt Readings from Last 3 Encounters:  08/18/18 234 lb 3.2 oz (106.2 kg)  08/02/18 228 lb 13.4 oz (103.8 kg)  07/05/18 235 lb (106.6 kg)    General: Elderly woman, appears comfortable at rest. HEENT: Conjunctiva and lids normal, oropharynx clear. Neck: Supple, no elevated JVP or carotid bruits, no thyromegaly. Lungs: Clear to auscultation, nonlabored breathing at rest. Cardiac: Regular rate  and rhythm, no S3 or significant systolic murmur. Abdomen: Soft, nontender, bowel sounds present. Extremities: Chronic lymphedema, distal pulses 2+. Skin: Warm and dry. Musculoskeletal: No kyphosis. Neuropsychiatric: Alert and oriented x3, affect grossly appropriate.  ECG: I personally reviewed the tracing from 08/01/2018 which shows sinus rhythm with PACs, nonspecific T wave changes and borderline low voltage.  Recent Labwork: 08/01/2018: ALT 14; AST 14; B Natriuretic Peptide 125.0; TSH 1.004 08/02/2018: BUN  12; Creatinine, Ser 0.97; Hemoglobin 12.8; Magnesium 1.7; Platelets 182; Potassium 3.2; Sodium 142     Component Value Date/Time   CHOL 160 08/02/2018 0417   CHOL 213 (H) 04/15/2018 1032   TRIG 189 (H) 08/02/2018 0417   HDL 43 08/02/2018 0417   HDL 65 04/15/2018 1032   CHOLHDL 3.7 08/02/2018 0417   VLDL 38 08/02/2018 0417   LDLCALC 79 08/02/2018 0417   LDLCALC 120 (H) 04/15/2018 1032    Other Studies Reviewed Today:  Echocardiogram 08/02/2018: Study Conclusions  - Left ventricle: The cavity size was normal. Wall thickness was   increased increased in a pattern of mild to moderate LVH.   Systolic function was normal. The estimated ejection fraction was   in the range of 60% to 65%. Wall motion was normal; there were no   regional wall motion abnormalities. Doppler parameters are   consistent with abnormal left ventricular relaxation (grade 1   diastolic dysfunction). - Aortic valve: Moderately calcified annulus. Trileaflet. - Mitral valve: Mildly calcified annulus. There was trivial   regurgitation. - Right atrium: Central venous pressure (est): 3 mm Hg. - Atrial septum: No defect or patent foramen ovale was identified. - Tricuspid valve: There was trivial regurgitation. - Pulmonary arteries: Systolic pressure could not be accurately   estimated. - Pericardium, extracardiac: There was no pericardial effusion.  Chest x-ray 08/01/2018: FINDINGS: Heart size is upper normal, stable. Lungs are clear. No pleural effusion or pneumothorax seen. No acute or suspicious osseous finding. Chronic elevation of the RIGHT hemidiaphragm.  IMPRESSION: No active cardiopulmonary disease. No evidence of pneumonia or pulmonary edema.  Assessment and Plan:  1.  Paroxysmal atrial fibrillation.  Recent breakthrough episode as noted above, but now in sinus rhythm.  Continue current dose of Toprol-XL along with Eliquis for stroke prophylaxis.  I plan to arrange a Lexiscan Myoview to assess  ischemic burden in case we need to use antiarrhythmic therapy such as flecainide.  Office follow-up arranged.  2.  Essential hypertension, blood pressure is reasonably well controlled today.  She continues on lisinopril and follows with PCP.  3.  Chronic lymphedema.  Has been treated with mechanical compression.  Current medicines were reviewed with the patient today.   Orders Placed This Encounter  Procedures  . NM Myocar Multi W/Spect W/Wall Motion / EF  . EKG 12-Lead    Disposition: Follow-up in 3 months in [place] office.  Signed, Jonelle Sidle, MD, [facility] 08/18/2018 11:15 AM    [facility] at [facility] [address] [place], [place], [place] [address] Phone: [phone]; Fax: [phone]

## 2018-08-18 ENCOUNTER — Ambulatory Visit (INDEPENDENT_AMBULATORY_CARE_PROVIDER_SITE_OTHER): Payer: Medicare Other | Admitting: Cardiology

## 2018-08-18 ENCOUNTER — Encounter: Payer: Self-pay | Admitting: Cardiology

## 2018-08-18 ENCOUNTER — Telehealth: Payer: Self-pay | Admitting: Cardiology

## 2018-08-18 ENCOUNTER — Encounter: Payer: Self-pay | Admitting: *Deleted

## 2018-08-18 VITALS — BP 140/62 | HR 57 | Ht 66.0 in | Wt 234.2 lb

## 2018-08-18 DIAGNOSIS — I10 Essential (primary) hypertension: Secondary | ICD-10-CM

## 2018-08-18 DIAGNOSIS — I480 Paroxysmal atrial fibrillation: Secondary | ICD-10-CM

## 2018-08-18 DIAGNOSIS — R0609 Other forms of dyspnea: Secondary | ICD-10-CM

## 2018-08-18 DIAGNOSIS — I890 Lymphedema, not elsewhere classified: Secondary | ICD-10-CM

## 2018-08-18 NOTE — Telephone Encounter (Signed)
°  Precert needed for: [employer] on medications dx: DOE & PAF   Location: [facility]     Date: Aug 23, 2018

## 2018-08-18 NOTE — Patient Instructions (Addendum)
Medication Instructions:   Your physician recommends that you continue on your current medications as directed. Please refer to the Current Medication list given to you today.  Labwork:  NONE  Testing/Procedures: Your physician has requested that you have a lexiscan myoview. For further information please visit [email or web address] Please follow instruction sheet, as given.  Follow-Up:  Your physician recommends that you schedule a follow-up appointment in: 3 months at the [place] office.  Any Other Special Instructions Will Be Listed Below (If Applicable).  If you need a refill on your cardiac medications before your next appointment, please call your pharmacy.

## 2018-08-23 ENCOUNTER — Encounter (HOSPITAL_COMMUNITY): Payer: Medicare Other

## 2018-08-23 ENCOUNTER — Telehealth: Payer: Self-pay | Admitting: Cardiology

## 2018-08-23 NOTE — Telephone Encounter (Signed)
Patient had to reschedule her Lexiscan   She would like to know if this test if where she just has to lay on table.   Stated that she can not do a treadmill due to knees and COPD

## 2018-08-23 NOTE — Telephone Encounter (Signed)
Patient informed that she will not be walking on the treadmill.  She verbalized understanding.

## 2018-08-30 ENCOUNTER — Ambulatory Visit (HOSPITAL_COMMUNITY)
Admission: RE | Admit: 2018-08-30 | Discharge: 2018-08-30 | Disposition: A | Discharge status: Home / Self Care | Payer: Medicare Other | Source: Ambulatory Visit | Attending: Cardiology | Admitting: Cardiology

## 2018-08-30 ENCOUNTER — Encounter (HOSPITAL_COMMUNITY): Payer: Self-pay

## 2018-08-30 ENCOUNTER — Encounter (HOSPITAL_COMMUNITY)
Admission: RE | Admit: 2018-08-30 | Discharge: 2018-08-30 | Disposition: A | Discharge status: Home / Self Care | Payer: Medicare Other | Source: Ambulatory Visit | Attending: Cardiology | Admitting: Cardiology

## 2018-08-30 DIAGNOSIS — R0609 Other forms of dyspnea: Secondary | ICD-10-CM | POA: Diagnosis not present

## 2018-08-30 DIAGNOSIS — I480 Paroxysmal atrial fibrillation: Secondary | ICD-10-CM | POA: Diagnosis not present

## 2018-08-30 LAB — NM MYOCAR MULTI W/SPECT W/WALL MOTION / EF
CHL CUP NUCLEAR SDS: 2
CHL CUP NUCLEAR SRS: 7
CHL CUP NUCLEAR SSS: 9
CHL CUP RESTING HR STRESS: 60 {beats}/min
LV dias vol: 60 mL (ref 46–106)
LV sys vol: 14 mL
NUC STRESS TID: 1.21
Peak HR: 78 {beats}/min
RATE: 0.57

## 2018-08-30 MED ORDER — TECHNETIUM TC 99M TETROFOSMIN IV KIT
30.0000 | PACK | Freq: Once | INTRAVENOUS | Status: AC | PRN
  Administered 2018-08-30: 30.5 via INTRAVENOUS

## 2018-08-30 MED ORDER — TECHNETIUM TC 99M TETROFOSMIN IV KIT
10.0000 | PACK | Freq: Once | INTRAVENOUS | Status: AC | PRN
  Administered 2018-08-30: 9.78 via INTRAVENOUS

## 2018-08-30 MED ORDER — SODIUM CHLORIDE 0.9% FLUSH
INTRAVENOUS | Status: AC
  Administered 2018-08-30: 10 mL via INTRAVENOUS
  Filled 2018-08-30: qty 10

## 2018-08-30 MED ORDER — REGADENOSON 0.4 MG/5ML IV SOLN
INTRAVENOUS | Status: AC
  Administered 2018-08-30: 0.4 mg via INTRAVENOUS
  Filled 2018-08-30: qty 5

## 2018-08-31 ENCOUNTER — Other Ambulatory Visit: Payer: Self-pay | Admitting: Physician Assistant

## 2018-09-01 ENCOUNTER — Other Ambulatory Visit: Payer: Self-pay | Admitting: Physician Assistant

## 2018-09-10 ENCOUNTER — Other Ambulatory Visit: Payer: Self-pay | Admitting: Cardiology

## 2018-11-08 DIAGNOSIS — I890 Lymphedema, not elsewhere classified: Secondary | ICD-10-CM | POA: Diagnosis not present

## 2018-11-08 DIAGNOSIS — E669 Obesity, unspecified: Secondary | ICD-10-CM | POA: Diagnosis not present

## 2018-11-08 DIAGNOSIS — J449 Chronic obstructive pulmonary disease, unspecified: Secondary | ICD-10-CM | POA: Diagnosis not present

## 2018-11-08 DIAGNOSIS — I4891 Unspecified atrial fibrillation: Secondary | ICD-10-CM | POA: Diagnosis not present

## 2018-11-23 NOTE — Progress Notes (Signed)
Cardiology Office Note  Date: 11/24/2018   ID: [name], DOB 03-Feb-1933, MRN [id number]  PCP: Remus Loffler, PA-C  Primary Cardiologist: Nona Dell, MD   Chief Complaint  Patient presents with  . Atrial Fibrillation    History of Present Illness: Courtney Berry is an 85 y.o. female last seen in October 2019.  She presents for a routine follow-up visit.  Since last encounter she does not report any persistent episodes of atrial fibrillation on current regimen.  Lexiscan Myoview from October 2019 was low risk, no active ischemia and normal LVEF.  We have talked about the possibility of initiating flecainide if she continues to have recurrent symptomatic atrial fibrillation.   She continues on Eliquis and Toprol-XL.  No reported bleeding episodes.  Past Medical History:  Diagnosis Date  . COPD (chronic obstructive pulmonary disease) (HCC)   . Diverticulosis   . Essential hypertension, benign   . History of chest pain    Low risk Cardiolite 2010  . Lymphedema   . Mixed hyperlipidemia   . PAF (paroxysmal atrial fibrillation) (HCC)   . Rheumatoid arthritis [facility])     Past Surgical History:  Procedure Laterality Date  . TOTAL ABDOMINAL HYSTERECTOMY  1969    Current Outpatient Medications  Medication Sig Dispense Refill  . acetaminophen (TYLENOL) 500 MG tablet Take 1,000 mg by mouth every 6 (six) hours as needed for moderate pain.    Marland Kitchen ELIQUIS 5 MG TABS tablet TAKE ONE TABLET BY MOUTH TWICE DAILY 60 tablet 3  . lisinopril (PRINIVIL,ZESTRIL) 10 MG tablet TAKE ONE (1) TABLET EACH DAY 90 tablet 0  . metoprolol succinate (TOPROL-XL) 25 MG 24 hr tablet Take 1.5 tablets (37.5 mg total) by mouth 2 (two) times daily. 90 tablet 0  . PROAIR HFA 108 (90 Base) MCG/ACT inhaler Inhale 1-2 puffs into the lungs every 6 (six) hours as needed for wheezing or shortness of breath.     . umeclidinium-vilanterol (ANORO ELLIPTA) 62.5-25 MCG/INH AEPB Inhale 1 puff into the lungs daily.      No current facility-administered medications for this visit.    Allergies:  Darvocet [propoxyphene n-acetaminophen]; Nitrofurantoin monohyd macro; Plavix [clopidogrel bisulfate]; Sulfa antibiotics; and Penicillins   Social History: The patient  reports that she has never smoked. She has never used smokeless tobacco. She reports that she does not drink alcohol or use drugs.   ROS:  Please see the history of present illness. Otherwise, complete review of systems is positive for chronic shortness of breath.  All other systems are reviewed and negative.   Physical Exam: VS:  BP (!) 158/66   Pulse 62   Ht 5\' 6"  (1.676 m)   Wt 235 lb (106.6 kg)   SpO2 92%   BMI 37.93 kg/m , BMI Body mass index is 37.93 kg/m.  Wt Readings from Last 3 Encounters:  11/24/18 235 lb (106.6 kg)  08/18/18 234 lb 3.2 oz (106.2 kg)  08/02/18 228 lb 13.4 oz (103.8 kg)    General: Elderly woman, appears comfortable at rest. HEENT: Conjunctiva and lids normal, oropharynx clear. Neck: Supple, no elevated JVP or carotid bruits, no thyromegaly. Lungs: Clear to auscultation, nonlabored breathing at rest. Cardiac: Regular rate and rhythm, no S3 or significant systolic murmur. Abdomen: Soft, nontender, bowel sounds present. Extremities: Stable, chronic lymphedema, distal pulses 2+. Skin: Warm and dry. Musculoskeletal: No kyphosis. Neuropsychiatric: Alert and oriented x3, affect grossly appropriate.  ECG: I personally reviewed the tracing from 08/18/2018 which showed sinus  bradycardia.  Recent Labwork: 08/01/2018: ALT 14; AST 14; B Natriuretic Peptide 125.0; TSH 1.004 08/02/2018: BUN 12; Creatinine, Ser 0.97; Hemoglobin 12.8; Magnesium 1.7; Platelets 182; Potassium 3.2; Sodium 142     Component Value Date/Time   CHOL 160 08/02/2018 0417   CHOL 213 (H) 04/15/2018 1032   TRIG 189 (H) 08/02/2018 0417   HDL 43 08/02/2018 0417   HDL 65 04/15/2018 1032   CHOLHDL 3.7 08/02/2018 0417   VLDL 38 08/02/2018 0417    LDLCALC 79 08/02/2018 0417   LDLCALC 120 (H) 04/15/2018 1032    Other Studies Reviewed Today:  Eugenie Birks Myoview 08/30/2018:  There was no ST segment deviation noted during stress.  The study is normal. No ischemia or scar.  This is a low risk study.  Nuclear stress EF: 77%.  Assessment and Plan:  1.  Paroxysmal atrial fibrillation.  No major escalation in symptoms since last encounter.  She had a low risk interval Lexiscan Myoview.  At this point will plan to continue Toprol-XL and Eliquis.  Flecainide can be added if she has progressive symptomatic breakthrough.  Follow-up CBC and BMET for next visit.  2.  Essential hypertension, blood pressure elevated today.  She reports compliance with her medications.  Keep follow-up with PCP.  3.  Chronic lymphedema.  Mechanical compression used at home.  Current medicines were reviewed with the patient today.   Orders Placed This Encounter  Procedures  . CBC with Differential  . Basic Metabolic Panel (BMET)    Disposition: Follow-up in 6 months.  Signed, Jonelle Sidle, MD, [facility] 11/24/2018 10:58 AM    [facility] at [facility] [address] [place], [place] [address] Phone: [phone]; Fax: [phone]

## 2018-11-24 ENCOUNTER — Encounter: Payer: Self-pay | Admitting: Cardiology

## 2018-11-24 ENCOUNTER — Ambulatory Visit (INDEPENDENT_AMBULATORY_CARE_PROVIDER_SITE_OTHER): Payer: Medicare Other | Admitting: Cardiology

## 2018-11-24 VITALS — BP 158/66 | HR 62 | Ht 66.0 in | Wt 235.0 lb

## 2018-11-24 DIAGNOSIS — I10 Essential (primary) hypertension: Secondary | ICD-10-CM | POA: Diagnosis not present

## 2018-11-24 DIAGNOSIS — I890 Lymphedema, not elsewhere classified: Secondary | ICD-10-CM | POA: Diagnosis not present

## 2018-11-24 DIAGNOSIS — I480 Paroxysmal atrial fibrillation: Secondary | ICD-10-CM | POA: Diagnosis not present

## 2018-11-24 NOTE — Patient Instructions (Signed)
Medication Instructions:  Your physician recommends that you continue on your current medications as directed. Please refer to the Current Medication list given to you today.   Labwork: JUST BEFORE 6 MONTH VISIT  CBC BMET   Testing/Procedures: NONE  Follow-Up: Your physician wants you to follow-up in: 6 MONTHS.  You will receive a reminder letter in the mail two months in advance. If you don't receive a letter, please call our office to schedule the follow-up appointment.   Any Other Special Instructions Will Be Listed Below (If Applicable).     If you need a refill on your cardiac medications before your next appointment, please call your pharmacy.

## 2018-12-02 ENCOUNTER — Other Ambulatory Visit: Payer: Self-pay | Admitting: Physician Assistant

## 2018-12-07 ENCOUNTER — Ambulatory Visit: Payer: Medicare Other | Admitting: Cardiology

## 2018-12-29 ENCOUNTER — Encounter: Payer: Self-pay | Admitting: Physician Assistant

## 2018-12-29 ENCOUNTER — Ambulatory Visit (INDEPENDENT_AMBULATORY_CARE_PROVIDER_SITE_OTHER): Payer: Medicare Other | Admitting: Physician Assistant

## 2018-12-29 VITALS — BP 179/73 | HR 69 | Temp 97.7°F | Ht 66.0 in | Wt 237.2 lb

## 2018-12-29 DIAGNOSIS — I890 Lymphedema, not elsewhere classified: Secondary | ICD-10-CM

## 2018-12-29 DIAGNOSIS — I10 Essential (primary) hypertension: Secondary | ICD-10-CM

## 2018-12-29 MED ORDER — HYDROCHLOROTHIAZIDE 25 MG PO TABS: 25.0000 mg | ORAL_TABLET | Freq: Every day | ORAL | 0 refills | Status: DC

## 2018-12-29 MED ORDER — PREDNISONE 10 MG PO TABS: 10.0000 mg | ORAL_TABLET | Freq: Every day | ORAL | 2 refills | Status: DC

## 2018-12-30 LAB — CBC WITH DIFFERENTIAL/PLATELET
BASOS ABS: 0 10*3/uL (ref 0.0–0.2)
BASOS: 0 %
EOS (ABSOLUTE): 0.1 10*3/uL (ref 0.0–0.4)
Eos: 0 %
HEMOGLOBIN: 12.4 g/dL (ref 11.1–15.9)
Hematocrit: 37.1 % (ref 34.0–46.6)
Immature Grans (Abs): 0 10*3/uL (ref 0.0–0.1)
Immature Granulocytes: 0 %
LYMPHS ABS: 2 10*3/uL (ref 0.7–3.1)
Lymphs: 18 %
MCH: 31.2 pg (ref 26.6–33.0)
MCHC: 33.4 g/dL (ref 31.5–35.7)
MCV: 94 fL (ref 79–97)
MONOCYTES: 6 %
Monocytes Absolute: 0.7 10*3/uL (ref 0.1–0.9)
NEUTROS ABS: 8.6 10*3/uL — AB (ref 1.4–7.0)
Neutrophils: 76 %
PLATELETS: 182 10*3/uL (ref 150–450)
RBC: 3.97 x10E6/uL (ref 3.77–5.28)
RDW: 11.8 % (ref 11.7–15.4)
WBC: 11.4 10*3/uL — ABNORMAL HIGH (ref 3.4–10.8)

## 2018-12-30 LAB — CMP14+EGFR
ALT: 15 IU/L (ref 0–32)
AST: 14 IU/L (ref 0–40)
Albumin/Globulin Ratio: 1.4 (ref 1.2–2.2)
Albumin: 3.7 g/dL (ref 3.6–4.6)
Alkaline Phosphatase: 76 IU/L (ref 39–117)
BUN/Creatinine Ratio: 19 (ref 12–28)
BUN: 21 mg/dL (ref 8–27)
Bilirubin Total: 0.3 mg/dL (ref 0.0–1.2)
CALCIUM: 9.1 mg/dL (ref 8.7–10.3)
CO2: 24 mmol/L (ref 20–29)
CREATININE: 1.08 mg/dL — AB (ref 0.57–1.00)
Chloride: 102 mmol/L (ref 96–106)
GFR calc Af Amer: 55 mL/min/{1.73_m2} — ABNORMAL LOW (ref >59)
GFR, EST NON AFRICAN AMERICAN: 48 mL/min/{1.73_m2} — AB (ref >59)
GLOBULIN, TOTAL: 2.7 g/dL (ref 1.5–4.5)
Glucose: 195 mg/dL — ABNORMAL HIGH (ref 65–99)
Potassium: 4.3 mmol/L (ref 3.5–5.2)
Sodium: 142 mmol/L (ref 134–144)
Total Protein: 6.4 g/dL (ref 6.0–8.5)

## 2018-12-30 NOTE — Progress Notes (Signed)
BP (!) 179/73   Pulse 69   Temp 97.7 F (36.5 C) (Oral)   Ht 5' 6"  (1.676 m)   Wt 237 lb 3.2 oz (107.6 kg)   BMI 38.29 kg/m    Subjective:    Patient ID: Courtney Berry, female    DOB: 1933-11-09, 85 y.o.   MRN: [id number]  HPI: Courtney Berry is a 85 y.o. female presenting on 12/29/2018 for Leg Pain and feet pain  This patient comes in for periodic recheck on medications and conditions including lymphedema, chronic edema and stasis dermatitis changes.  She also has hypertension.  Her medications are reviewed. She has only been taking 1/2 dose of HCTZ. She would like to go up higher if needed.  She still has the pumps at home and uses every other day.    All medications are reviewed today. There are no reports of any problems with the medications. All of the medical conditions are reviewed and updated.  Lab work is reviewed and will be ordered as medically necessary. There are no new problems reported with today's visit.   Past Medical History:  Diagnosis Date  . COPD (chronic obstructive pulmonary disease) ([place])   . Diverticulosis   . Essential hypertension, benign   . History of chest pain    Low risk Cardiolite 2010  . Lymphedema   . Mixed hyperlipidemia   . PAF (paroxysmal atrial fibrillation) ([place])   . Rheumatoid arthritis ([place])    Relevant past medical, surgical, family and social history reviewed and updated as indicated. Interim medical history since our last visit reviewed. Allergies and medications reviewed and updated. DATA REVIEWED: CHART IN EPIC  Family History reviewed for pertinent findings.  Review of Systems  Constitutional: Negative.  Negative for activity change, fatigue and fever.  HENT: Negative.   Eyes: Negative.   Respiratory: Negative.  Negative for cough.   Cardiovascular: Positive for leg swelling. Negative for chest pain.  Gastrointestinal: Negative.  Negative for abdominal pain.  Endocrine: Negative.   Genitourinary: Negative.  Negative  for dysuria.  Musculoskeletal: Positive for arthralgias and back pain.  Skin: Positive for color change.  Neurological: Negative.     Allergies as of 12/29/2018      Reactions   Darvocet [propoxyphene N-acetaminophen] Other (See Comments)   unknown   Nitrofurantoin Monohyd Macro Other (See Comments)   unknown   Plavix [clopidogrel Bisulfate] Swelling   Face swells   Sulfa Antibiotics Other (See Comments)   "face got puffy"   Penicillins Rash   Has patient had a PCN reaction causing immediate rash, facial/tongue/throat swelling, SOB or lightheadedness with hypotension: Yes Has patient had a PCN reaction causing severe rash involving mucus membranes or skin necrosis: No Has patient had a PCN reaction that required hospitalization: No Has patient had a PCN reaction occurring within the last 10 years: No If all of the above answers are "NO", then may proceed with Cephalosporin use.      Medication List       Accurate as of December 29, 2018 11:59 PM. Always use your most recent med list.        acetaminophen 500 MG tablet Commonly known as:  TYLENOL Take 1,000 mg by mouth every 6 (six) hours as needed for moderate pain.   ANORO ELLIPTA 62.5-25 MCG/INH Aepb Generic drug:  umeclidinium-vilanterol Inhale 1 puff into the lungs daily.   ELIQUIS 5 MG Tabs tablet Generic drug:  apixaban TAKE ONE TABLET BY MOUTH TWICE DAILY  hydrochlorothiazide 25 MG tablet Commonly known as:  HYDRODIURIL Take 1 tablet (25 mg total) by mouth daily.   lisinopril 20 MG tablet Commonly known as:  PRINIVIL,ZESTRIL TAKE ONE (1) TABLET EACH DAY   metoprolol succinate 25 MG 24 hr tablet Commonly known as:  TOPROL-XL Take 1.5 tablets (37.5 mg total) by mouth 2 (two) times daily.   predniSONE 10 MG tablet Commonly known as:  DELTASONE Take 1 tablet (10 mg total) by mouth daily with breakfast.   PROAIR HFA 108 (90 Base) MCG/ACT inhaler Generic drug:  albuterol Inhale 1-2 puffs into the lungs  every 6 (six) hours as needed for wheezing or shortness of breath.          Objective:    BP (!) 179/73   Pulse 69   Temp 97.7 F (36.5 C) (Oral)   Ht 5' 6"  (1.676 m)   Wt 237 lb 3.2 oz (107.6 kg)   BMI 38.29 kg/m   Allergies  Allergen Reactions  . Darvocet [Propoxyphene N-Acetaminophen] Other (See Comments)    unknown  . Nitrofurantoin Monohyd Macro Other (See Comments)    unknown  . Plavix [Clopidogrel Bisulfate] Swelling    Face swells  . Sulfa Antibiotics Other (See Comments)    "face got puffy"  . Penicillins Rash    Has patient had a PCN reaction causing immediate rash, facial/tongue/throat swelling, SOB or lightheadedness with hypotension: Yes Has patient had a PCN reaction causing severe rash involving mucus membranes or skin necrosis: No Has patient had a PCN reaction that required hospitalization: No Has patient had a PCN reaction occurring within the last 10 years: No If all of the above answers are "NO", then may proceed with Cephalosporin use.     Wt Readings from Last 3 Encounters:  12/29/18 237 lb 3.2 oz (107.6 kg)  11/24/18 235 lb (106.6 kg)  08/18/18 234 lb 3.2 oz (106.2 kg)    Physical Exam Constitutional:      Appearance: She is well-developed.  HENT:     Head: Normocephalic and atraumatic.     Right Ear: Tympanic membrane, ear canal and external ear normal.     Left Ear: Tympanic membrane, ear canal and external ear normal.     Nose: Nose normal. No rhinorrhea.     Mouth/Throat:     Pharynx: No oropharyngeal exudate or posterior oropharyngeal erythema.  Eyes:     Conjunctiva/sclera: Conjunctivae normal.     Pupils: Pupils are equal, round, and reactive to light.  Neck:     Musculoskeletal: Normal range of motion and neck supple.  Cardiovascular:     Rate and Rhythm: Normal rate and regular rhythm.     Heart sounds: Normal heart sounds.  Pulmonary:     Effort: Pulmonary effort is normal.     Breath sounds: Normal breath sounds.    Abdominal:     General: Bowel sounds are normal.     Palpations: Abdomen is soft.  Skin:    General: Skin is warm and dry.     Findings: No rash.  Neurological:     Mental Status: She is alert and oriented to person, place, and time.     Deep Tendon Reflexes: Reflexes are normal and symmetric.  Psychiatric:        Behavior: Behavior normal.        Thought Content: Thought content normal.        Judgment: Judgment normal.     Results for orders placed or performed in visit  on 12/29/18  CBC with Differential/Platelet  Result Value Ref Range   WBC 11.4 (H) 3.4 - 10.8 x10E3/uL   RBC 3.97 3.77 - 5.28 x10E6/uL   Hemoglobin 12.4 11.1 - 15.9 g/dL   Hematocrit 37.1 34.0 - 46.6 %   MCV 94 79 - 97 fL   MCH 31.2 26.6 - 33.0 pg   MCHC 33.4 31.5 - 35.7 g/dL   RDW 11.8 11.7 - 15.4 %   Platelets 182 150 - 450 x10E3/uL   Neutrophils 76 Not Estab. %   Lymphs 18 Not Estab. %   Monocytes 6 Not Estab. %   Eos 0 Not Estab. %   Basos 0 Not Estab. %   Neutrophils Absolute 8.6 (H) 1.4 - 7.0 x10E3/uL   Lymphocytes Absolute 2.0 0.7 - 3.1 x10E3/uL   Monocytes Absolute 0.7 0.1 - 0.9 x10E3/uL   EOS (ABSOLUTE) 0.1 0.0 - 0.4 x10E3/uL   Basophils Absolute 0.0 0.0 - 0.2 x10E3/uL   Immature Granulocytes 0 Not Estab. %   Immature Grans (Abs) 0.0 0.0 - 0.1 x10E3/uL  CMP14+EGFR  Result Value Ref Range   Glucose 195 (H) 65 - 99 mg/dL   BUN 21 8 - 27 mg/dL   Creatinine, Ser 1.08 (H) 0.57 - 1.00 mg/dL   GFR calc non Af Amer 48 (L) >59 mL/min/1.73   GFR calc Af Amer 55 (L) >59 mL/min/1.73   BUN/Creatinine Ratio 19 12 - 28   Sodium 142 134 - 144 mmol/L   Potassium 4.3 3.5 - 5.2 mmol/L   Chloride 102 96 - 106 mmol/L   CO2 24 20 - 29 mmol/L   Calcium 9.1 8.7 - 10.3 mg/dL   Total Protein 6.4 6.0 - 8.5 g/dL   Albumin 3.7 3.6 - 4.6 g/dL   Globulin, Total 2.7 1.5 - 4.5 g/dL   Albumin/Globulin Ratio 1.4 1.2 - 2.2   Bilirubin Total 0.3 0.0 - 1.2 mg/dL   Alkaline Phosphatase 76 39 - 117 IU/L   AST 14 0 - 40  IU/L   ALT 15 0 - 32 IU/L      Assessment & Plan:   1. Lymphedema - CBC with Differential/Platelet  2. Lymphedema of both lower extremities - hydrochlorothiazide (HYDRODIURIL) 25 MG tablet; Take 1 tablet (25 mg total) by mouth daily.  Dispense: 90 tablet; Refill: 0 - CBC with Differential/Platelet - CMP14+EGFR  3. Essential hypertension - CMP14+EGFR   Continue all other maintenance medications as listed above.  Follow up plan: Return in about 6 weeks (around 02/09/2019).  Educational handout given for [place] PA-C [place] [address]  [place], [place] [address] [phone]   12/30/2018, 9:53 PM

## 2019-01-07 ENCOUNTER — Telehealth: Payer: Self-pay | Admitting: Physician Assistant

## 2019-01-07 NOTE — Telephone Encounter (Signed)
Aware of lab results  

## 2019-01-18 ENCOUNTER — Other Ambulatory Visit: Payer: Self-pay | Admitting: Cardiology

## 2019-01-18 MED ORDER — METOPROLOL SUCCINATE ER 25 MG PO TB24: 37.5000 mg | ORAL_TABLET | Freq: Two times a day (BID) | ORAL | 6 refills | Status: DC

## 2019-01-18 NOTE — Telephone Encounter (Signed)
Pt is needing refill on   metoprolol succinate (TOPROL-XL) 25 MG 24 hr tablet [[id number]] ENDED   Sent to [facility] [place]

## 2019-01-18 NOTE — Telephone Encounter (Signed)
Refilled per request.

## 2019-02-09 ENCOUNTER — Telehealth (INDEPENDENT_AMBULATORY_CARE_PROVIDER_SITE_OTHER): Payer: Medicare Other | Admitting: Physician Assistant

## 2019-02-09 ENCOUNTER — Other Ambulatory Visit: Payer: Self-pay

## 2019-02-09 DIAGNOSIS — I890 Lymphedema, not elsewhere classified: Secondary | ICD-10-CM

## 2019-02-09 DIAGNOSIS — L03116 Cellulitis of left lower limb: Secondary | ICD-10-CM

## 2019-02-09 DIAGNOSIS — I10 Essential (primary) hypertension: Secondary | ICD-10-CM

## 2019-02-09 DIAGNOSIS — G629 Polyneuropathy, unspecified: Secondary | ICD-10-CM | POA: Diagnosis not present

## 2019-02-09 MED ORDER — CIPROFLOXACIN HCL 250 MG PO TABS: 250.0000 mg | ORAL_TABLET | Freq: Two times a day (BID) | ORAL | 0 refills | Status: DC

## 2019-02-09 MED ORDER — GABAPENTIN 100 MG PO CAPS: 100.0000 mg | ORAL_CAPSULE | Freq: Every day | ORAL | 2 refills | Status: DC

## 2019-02-10 NOTE — Progress Notes (Signed)
Telephone visit  Subjective: CC: Recheck on lymphedema PCP: Remus Loffler, PA-C [id number] Courtney Berry is a 85 y.o. female calls for telephone consult today. Patient provides verbal consent for consult held via phone.  Location of patient: [place] Location of provider: [facility] Others present for call: None  This is a telephone encounter to review the medical conditions of the patient.  She does have hypertension and she reports that has been fairly well controlled lately.  Her husband or her daughter help her with checking it.  She denies any chest pain, shortness of breath, no nausea vomiting or diarrhea.  She has chronic lymphedema in both legs.  She does go to physical therapy to have them pump.  She does have a machine at home.  She states that she is getting a little bit of redness and swelling in her left lower leg.  She has had cellulitis there before.  The only new complaint she has with this is a burning and tingling of her feet most all the time.  She mostly notices it at night when she is laying down and has her feet up.  We have discussed the possibility of neuropathy being caused by her current condition.   ROS: Per HPI  Allergies  Allergen Reactions  . Darvocet [Propoxyphene N-Acetaminophen] Other (See Comments)    unknown  . Nitrofurantoin Monohyd Macro Other (See Comments)    unknown  . Plavix [Clopidogrel Bisulfate] Swelling    Face swells  . Sulfa Antibiotics Other (See Comments)    "face got puffy"  . Penicillins Rash    Has patient had a PCN reaction causing immediate rash, facial/tongue/throat swelling, SOB or lightheadedness with hypotension: Yes Has patient had a PCN reaction causing severe rash involving mucus membranes or skin necrosis: No Has patient had a PCN reaction that required hospitalization: No Has patient had a PCN reaction occurring within the last 10 years: No If all of the above answers are "NO", then may proceed with Cephalosporin use.    Past  Medical History:  Diagnosis Date  . COPD (chronic obstructive pulmonary disease) (HCC)   . Diverticulosis   . Essential hypertension, benign   . History of chest pain    Low risk Cardiolite 2010  . Lymphedema   . Mixed hyperlipidemia   . PAF (paroxysmal atrial fibrillation) (HCC)   . Rheumatoid arthritis (HCC)     Current Outpatient Medications:  .  acetaminophen (TYLENOL) 500 MG tablet, Take 1,000 mg by mouth every 6 (six) hours as needed for moderate pain., Disp: , Rfl:  .  ciprofloxacin (CIPRO) 250 MG tablet, Take 1 tablet (250 mg total) by mouth 2 (two) times daily., Disp: 20 tablet, Rfl: 0 .  ELIQUIS 5 MG TABS tablet, TAKE ONE TABLET BY MOUTH TWICE DAILY, Disp: 60 tablet, Rfl: 3 .  gabapentin (NEURONTIN) 100 MG capsule, Take 1 capsule (100 mg total) by mouth at bedtime., Disp: 90 capsule, Rfl: 2 .  hydrochlorothiazide (HYDRODIURIL) 25 MG tablet, Take 1 tablet (25 mg total) by mouth daily., Disp: 90 tablet, Rfl: 0 .  lisinopril (PRINIVIL,ZESTRIL) 20 MG tablet, TAKE ONE (1) TABLET EACH DAY, Disp: 90 tablet, Rfl: 0 .  metoprolol succinate (TOPROL-XL) 25 MG 24 hr tablet, Take 1.5 tablets (37.5 mg total) by mouth 2 (two) times daily for 30 days., Disp: 90 tablet, Rfl: 6 .  predniSONE (DELTASONE) 10 MG tablet, Take 1 tablet (10 mg total) by mouth daily with breakfast., Disp: 30 tablet, Rfl: 2 .  PROAIR HFA 108 (90 Base) MCG/ACT inhaler, Inhale 1-2 puffs into the lungs every 6 (six) hours as needed for wheezing or shortness of breath. , Disp: , Rfl:  .  umeclidinium-vilanterol (ANORO ELLIPTA) 62.5-25 MCG/INH AEPB, Inhale 1 puff into the lungs daily., Disp: , Rfl:   Assessment/ Plan: 85 y.o. female   1. Lymphedema Continue standard treatment  2. Essential hypertension Continue medications  3. Cellulitis of left lower extremity - ciprofloxacin (CIPRO) 250 MG tablet; Take 1 tablet (250 mg total) by mouth 2 (two) times daily.  Dispense: 20 tablet; Refill: 0  4. Neuropathy - gabapentin  (NEURONTIN) 100 MG capsule; Take 1 capsule (100 mg total) by mouth at bedtime.  Dispense: 90 capsule; Refill: 2   Start time: 2:35 End time: 2:42  Meds ordered this encounter  Medications  . gabapentin (NEURONTIN) 100 MG capsule    Sig: Take 1 capsule (100 mg total) by mouth at bedtime.    Dispense:  90 capsule    Refill:  2    Order Specific Question:   Supervising Provider    Answer:   Raliegh IpGOTTSCHALK, ASHLY M [[id number]]  . ciprofloxacin (CIPRO) 250 MG tablet    Sig: Take 1 tablet (250 mg total) by mouth 2 (two) times daily.    Dispense:  20 tablet    Refill:  0    Order Specific Question:   Supervising Provider    Answer:   Raliegh IpGOTTSCHALK, ASHLY M [[id number]]    Prudy FeelerAngel Fatima Fedie PA-C [facility] [phone]

## 2019-02-22 ENCOUNTER — Other Ambulatory Visit: Payer: Self-pay | Admitting: Physician Assistant

## 2019-03-01 ENCOUNTER — Telehealth: Payer: Self-pay | Admitting: Physician Assistant

## 2019-03-01 ENCOUNTER — Telehealth: Payer: Self-pay | Admitting: Cardiology

## 2019-03-01 NOTE — Telephone Encounter (Signed)
Patient calling the office for samples of medication:   1.  What medication and dosage are you requesting samples for?   Eliquis   2.  Are you currently out of this medication? No,   but cannot afford the copay    Please call daughter at [phone]

## 2019-03-01 NOTE — Telephone Encounter (Signed)
Pt daughter has called wanting to know if we have samples of ELIQUIS 5 MG TABS tablet and umeclidinium-vilanterol (ANORO ELLIPTA) 62.5-25 MCG/INH AEPB

## 2019-03-01 NOTE — Telephone Encounter (Signed)
Left voicemail that we do not have samples of the requested medications.

## 2019-03-01 NOTE — Telephone Encounter (Signed)
1 box eliquis sample given, see sign out book

## 2019-03-02 ENCOUNTER — Other Ambulatory Visit: Payer: Self-pay

## 2019-03-02 ENCOUNTER — Encounter: Payer: Self-pay | Admitting: Physician Assistant

## 2019-03-02 ENCOUNTER — Ambulatory Visit (INDEPENDENT_AMBULATORY_CARE_PROVIDER_SITE_OTHER): Payer: Medicare Other | Admitting: Physician Assistant

## 2019-03-02 DIAGNOSIS — I890 Lymphedema, not elsewhere classified: Secondary | ICD-10-CM | POA: Diagnosis not present

## 2019-03-02 DIAGNOSIS — G629 Polyneuropathy, unspecified: Secondary | ICD-10-CM

## 2019-03-02 MED ORDER — GABAPENTIN 100 MG PO CAPS: 200.0000 mg | ORAL_CAPSULE | Freq: Every day | ORAL | 2 refills | Status: DC

## 2019-03-02 NOTE — Progress Notes (Signed)
Telephone visit  Subjective: EH:Courtney Berry, nerve pain PCP: Remus Loffler, PA-C [id number] Courtney Berry is a 85 y.o. female calls for telephone consult today. Patient provides verbal consent for consult held via phone.  Patient is identified with 2 separate identifiers.  At this time the entire area is on COVID-19 social distancing and stay home orders are in place.  Patient is of higher risk and therefore we are performing this by a virtual method.  Location of patient: home  Location of provider: [facility] Others present for call: no  She is doing much better in regards to the cellulitis in the lower legs. Her swelling is much better and she has not had in flare up of the skins or swelling. She is taking gabapentin 100 mg at bedtime, and tolerating it very well. She is willing to increase the medication to help with the neuropathy pain.  There are no other issues going on at this time.   ROS: Per HPI  Allergies  Allergen Reactions  . Darvocet [Propoxyphene N-Acetaminophen] Other (See Comments)    unknown  . Nitrofurantoin Monohyd Macro Other (See Comments)    unknown  . Plavix [Clopidogrel Bisulfate] Swelling    Face swells  . Sulfa Antibiotics Other (See Comments)    "face got puffy"  . Penicillins Rash    Has patient had a PCN reaction causing immediate rash, facial/tongue/throat swelling, SOB or lightheadedness with hypotension: Yes Has patient had a PCN reaction causing severe rash involving mucus membranes or skin necrosis: No Has patient had a PCN reaction that required hospitalization: No Has patient had a PCN reaction occurring within the last 10 years: No If all of the above answers are "NO", then may proceed with Cephalosporin use.    Past Medical History:  Diagnosis Date  . COPD (chronic obstructive pulmonary disease) (HCC)   . Diverticulosis   . Essential hypertension, benign   . History of chest pain    Low risk Cardiolite 2010  . Lymphedema   . Mixed  hyperlipidemia   . PAF (paroxysmal atrial fibrillation) (HCC)   . Rheumatoid arthritis (HCC)     Current Outpatient Medications:  .  acetaminophen (TYLENOL) 500 MG tablet, Take 1,000 mg by mouth every 6 (six) hours as needed for moderate pain., Disp: , Rfl:  .  ciprofloxacin (CIPRO) 250 MG tablet, Take 1 tablet (250 mg total) by mouth 2 (two) times daily., Disp: 20 tablet, Rfl: 0 .  ELIQUIS 5 MG TABS tablet, TAKE ONE TABLET BY MOUTH TWICE DAILY, Disp: 60 tablet, Rfl: 3 .  fluticasone (FLONASE) 50 MCG/ACT nasal spray, USE 1 SPRAY IN EACH NOSTRIL TWICE DAILY, Disp: 16 g, Rfl: 0 .  gabapentin (NEURONTIN) 100 MG capsule, Take 2 capsules (200 mg total) by mouth at bedtime., Disp: 180 capsule, Rfl: 2 .  hydrochlorothiazide (HYDRODIURIL) 25 MG tablet, Take 1 tablet (25 mg total) by mouth daily., Disp: 90 tablet, Rfl: 0 .  lisinopril (PRINIVIL,ZESTRIL) 20 MG tablet, TAKE ONE (1) TABLET EACH DAY, Disp: 90 tablet, Rfl: 0 .  metoprolol succinate (TOPROL-XL) 25 MG 24 hr tablet, Take 1.5 tablets (37.5 mg total) by mouth 2 (two) times daily for 30 days., Disp: 90 tablet, Rfl: 6 .  predniSONE (DELTASONE) 10 MG tablet, Take 1 tablet (10 mg total) by mouth daily with breakfast., Disp: 30 tablet, Rfl: 2 .  PROAIR HFA 108 (90 Base) MCG/ACT inhaler, Inhale 1-2 puffs into the lungs every 6 (six) hours as needed for wheezing or shortness of breath. ,  Disp: , Rfl:  .  umeclidinium-vilanterol (ANORO ELLIPTA) 62.5-25 MCG/INH AEPB, Inhale 1 puff into the lungs daily., Disp: , Rfl:   Assessment/ Plan: 85 y.o. female   1. Neuropathy - gabapentin (NEURONTIN) 100 MG capsule; Take 2 capsules (200 mg total) by mouth at bedtime.  Dispense: 180 capsule; Refill: 2  2. Lymphedema of both lower extremities Continue treatment and pump for lymhedema   Start time: 10:40 AM End time: 10:55 AM  Meds ordered this encounter  Medications  . gabapentin (NEURONTIN) 100 MG capsule    Sig: Take 2 capsules (200 mg total) by mouth  at bedtime.    Dispense:  180 capsule    Refill:  2    Keep on file until patient calls    Order Specific Question:   Supervising Provider    Answer:   Raliegh Ip [[id number]]    Prudy Feeler PA-C [facility] [phone]

## 2019-03-15 ENCOUNTER — Other Ambulatory Visit: Payer: Self-pay | Admitting: Physician Assistant

## 2019-03-15 NOTE — Telephone Encounter (Signed)
Last seen 03/02/2019

## 2019-04-08 ENCOUNTER — Other Ambulatory Visit: Payer: Self-pay | Admitting: Cardiology

## 2019-04-25 ENCOUNTER — Encounter: Payer: Self-pay | Admitting: Family Medicine

## 2019-04-25 ENCOUNTER — Other Ambulatory Visit: Payer: Self-pay

## 2019-04-25 ENCOUNTER — Ambulatory Visit (INDEPENDENT_AMBULATORY_CARE_PROVIDER_SITE_OTHER): Payer: Medicare Other | Admitting: Family Medicine

## 2019-04-25 DIAGNOSIS — J0140 Acute pansinusitis, unspecified: Secondary | ICD-10-CM

## 2019-04-25 MED ORDER — DOXYCYCLINE HYCLATE 100 MG PO TABS: 100.0000 mg | ORAL_TABLET | Freq: Two times a day (BID) | ORAL | 0 refills | Status: AC

## 2019-04-25 MED ORDER — FLUTICASONE PROPIONATE 50 MCG/ACT NA SUSP: 2.0000 | Freq: Every day | NASAL | 6 refills | Status: DC

## 2019-04-25 NOTE — Progress Notes (Signed)
Virtual Visit via telephone Note Due to COVID-19, visit is conducted virtually and was requested by patient. This visit type was conducted due to national recommendations for restrictions regarding the COVID-19 Pandemic (e.g. social distancing) in an effort to limit this patient's exposure and mitigate transmission in our community. All issues noted in this document were discussed and addressed.  A physical exam was not performed with this format.   I connected with Courtney Berry on 04/25/19 at 1245 by telephone and verified that I am speaking with the correct person using two identifiers. [name] is currently located at home and no one is currently with them during visit. The provider, Kari Baars, FNP is located in their office at time of visit.  I discussed the limitations, risks, security and privacy concerns of performing an evaluation and management service by telephone and the availability of in person appointments. I also discussed with the patient that there may be a patient responsible charge related to this service. The patient expressed understanding and agreed to proceed.  Subjective:  Patient ID: Courtney Berry, female    DOB: March 07, 1933, 85 y.o.   MRN: [id number]  Chief Complaint:  Nasal Congestion and Sore Throat   HPI: Courtney Berry is a 85 y.o. female presenting on 04/25/2019 for Nasal Congestion and Sore Throat   Pt reports 2 weeks of nasal congestion, facial pressure, sore throat, intermittent dizziness that is worse with certain movements, and chills. She denies shortness of breath, confusion, weakness, sputum production, or chest pain. She has been taking tylenol without relief of symptoms.     Relevant past medical, surgical, family, and social history reviewed and updated as indicated.  Allergies and medications reviewed and updated.   Past Medical History:  Diagnosis Date  . COPD (chronic obstructive pulmonary disease) (HCC)   . Diverticulosis    . Essential hypertension, benign   . History of chest pain    Low risk Cardiolite 2010  . Lymphedema   . Mixed hyperlipidemia   . PAF (paroxysmal atrial fibrillation) (HCC)   . Rheumatoid arthritis [facility])     Past Surgical History:  Procedure Laterality Date  . TOTAL ABDOMINAL HYSTERECTOMY  1969    Social History   Socioeconomic History  . Marital status: Married    Spouse name: Not on file  . Number of children: Not on file  . Years of education: Not on file  . Highest education level: Not on file  Occupational History  . Occupation: Retired  Engineer, production  . Financial resource strain: Not hard at all  . Food insecurity:    Worry: Never true    Inability: Never true  . Transportation needs:    Medical: No    Non-medical: No  Tobacco Use  . Smoking status: Never Smoker  . Smokeless tobacco: Never Used  . Tobacco comment: Tobacco use-no  Substance and Sexual Activity  . Alcohol use: No    Alcohol/week: 0.0 standard drinks  . Drug use: No  . Sexual activity: Not on file  Lifestyle  . Physical activity:    Days per week: 0 days    Minutes per session: 0 min  . Stress: Only a little  Relationships  . Social connections:    Talks on phone: Twice a week    Gets together: Twice a week    Attends religious service: More than 4 times per year    Active member of club or organization: Yes    Attends  meetings of clubs or organizations: 1 to 4 times per year    Relationship status: Married  . Intimate partner violence:    Fear of current or ex partner: No    Emotionally abused: No    Physically abused: No    Forced sexual activity: No  Other Topics Concern  . Not on file  Social History Narrative  . Not on file    Outpatient Encounter Medications as of 04/25/2019  Medication Sig  . acetaminophen (TYLENOL) 500 MG tablet Take 1,000 mg by mouth every 6 (six) hours as needed for moderate pain.  . benzonatate (TESSALON) 200 MG capsule TAKE 1 CAPSULE TWICE DAILY AS  NEEDED FOR COUGH  . ciprofloxacin (CIPRO) 250 MG tablet Take 1 tablet (250 mg total) by mouth 2 (two) times daily.  Marland Kitchen doxycycline (VIBRA-TABS) 100 MG tablet Take 1 tablet (100 mg total) by mouth 2 (two) times daily for 7 days. 1 po bid  . ELIQUIS 5 MG TABS tablet TAKE ONE TABLET BY MOUTH TWICE DAILY  . fluticasone (FLONASE) 50 MCG/ACT nasal spray Place 2 sprays into both nostrils daily.  Marland Kitchen gabapentin (NEURONTIN) 100 MG capsule Take 2 capsules (200 mg total) by mouth at bedtime.  . hydrochlorothiazide (HYDRODIURIL) 25 MG tablet Take 1 tablet (25 mg total) by mouth daily.  Marland Kitchen lisinopril (PRINIVIL,ZESTRIL) 20 MG tablet TAKE ONE (1) TABLET EACH DAY  . metoprolol succinate (TOPROL-XL) 25 MG 24 hr tablet Take 1.5 tablets (37.5 mg total) by mouth 2 (two) times daily for 30 days.  . predniSONE (DELTASONE) 10 MG tablet Take 1 tablet (10 mg total) by mouth daily with breakfast.  . PROAIR HFA 108 (90 Base) MCG/ACT inhaler Inhale 1-2 puffs into the lungs every 6 (six) hours as needed for wheezing or shortness of breath.   . umeclidinium-vilanterol (ANORO ELLIPTA) 62.5-25 MCG/INH AEPB Inhale 1 puff into the lungs daily.  . [DISCONTINUED] fluticasone (FLONASE) 50 MCG/ACT nasal spray USE 1 SPRAY IN EACH NOSTRIL TWICE DAILY   No facility-administered encounter medications on file as of 04/25/2019.     Allergies  Allergen Reactions  . Darvocet [Propoxyphene N-Acetaminophen] Other (See Comments)    unknown  . Nitrofurantoin Monohyd Macro Other (See Comments)    unknown  . Plavix [Clopidogrel Bisulfate] Swelling    Face swells  . Sulfa Antibiotics Other (See Comments)    "face got puffy"  . Penicillins Rash    Has patient had a PCN reaction causing immediate rash, facial/tongue/throat swelling, SOB or lightheadedness with hypotension: Yes Has patient had a PCN reaction causing severe rash involving mucus membranes or skin necrosis: No Has patient had a PCN reaction that required hospitalization: No Has  patient had a PCN reaction occurring within the last 10 years: No If all of the above answers are "NO", then may proceed with Cephalosporin use.     Review of Systems  Constitutional: Positive for chills and fatigue. Negative for fever.  HENT: Positive for congestion, rhinorrhea, sinus pressure and sore throat.   Eyes: Negative for photophobia and visual disturbance.  Respiratory: Positive for cough. Negative for chest tightness, shortness of breath and wheezing.   Cardiovascular: Negative for chest pain, palpitations and leg swelling.  Gastrointestinal: Negative for abdominal pain.  Genitourinary: Negative for decreased urine volume and difficulty urinating.  Musculoskeletal: Negative for arthralgias, gait problem and myalgias.  Neurological: Positive for dizziness (intermittent ) and headaches. Negative for tremors, seizures, syncope, facial asymmetry, speech difficulty, weakness, light-headedness and numbness.  Psychiatric/Behavioral: Negative for confusion.  All other systems reviewed and are negative.        Observations/Objective: No vital signs or physical exam, this was a telephone or virtual health encounter.  Pt alert and oriented, answers all questions appropriately, and able to speak in full sentences.    Assessment and Plan: Raley was seen today for nasal congestion and sore throat.  Diagnoses and all orders for this visit:  Acute non-recurrent pansinusitis Reported symptoms consistent with sinusitis. Due to length of symptoms, will treat with below. Symptomatic care discussed. Report any new or worsening symptoms.  -     doxycycline (VIBRA-TABS) 100 MG tablet; Take 1 tablet (100 mg total) by mouth 2 (two) times daily for 7 days. 1 po bid -     fluticasone (FLONASE) 50 MCG/ACT nasal spray; Place 2 sprays into both nostrils daily.     Follow Up Instructions: Return if symptoms worsen or fail to improve.    I discussed the assessment and treatment plan with  the patient. The patient was provided an opportunity to ask questions and all were answered. The patient agreed with the plan and demonstrated an understanding of the instructions.   The patient was advised to call back or seek an in-person evaluation if the symptoms worsen or if the condition fails to improve as anticipated.  The above assessment and management plan was discussed with the patient. The patient verbalized understanding of and has agreed to the management plan. Patient is aware to call the clinic if symptoms persist or worsen. Patient is aware when to return to the clinic for a follow-up visit. Patient educated on when it is appropriate to go to the emergency department.    I provided 15 minutes of non-face-to-face time during this encounter. The call started at 1245. The call ended at 1300. The other time was used for coordination of care.    Monia Pouch, FNP-C [facility] [address] [place], [place] [address] [phone]

## 2019-05-04 ENCOUNTER — Other Ambulatory Visit: Payer: Self-pay | Admitting: Physician Assistant

## 2019-05-10 DIAGNOSIS — I4891 Unspecified atrial fibrillation: Secondary | ICD-10-CM | POA: Diagnosis not present

## 2019-05-10 DIAGNOSIS — J449 Chronic obstructive pulmonary disease, unspecified: Secondary | ICD-10-CM | POA: Diagnosis not present

## 2019-05-10 DIAGNOSIS — G629 Polyneuropathy, unspecified: Secondary | ICD-10-CM | POA: Diagnosis not present

## 2019-05-10 DIAGNOSIS — J301 Allergic rhinitis due to pollen: Secondary | ICD-10-CM | POA: Diagnosis not present

## 2019-05-17 ENCOUNTER — Other Ambulatory Visit: Payer: Self-pay | Admitting: Physician Assistant

## 2019-05-18 ENCOUNTER — Other Ambulatory Visit: Payer: Self-pay | Admitting: Physician Assistant

## 2019-06-01 ENCOUNTER — Ambulatory Visit: Payer: Medicare Other | Admitting: Physician Assistant

## 2019-06-06 ENCOUNTER — Telehealth: Payer: Self-pay | Admitting: Physician Assistant

## 2019-06-06 ENCOUNTER — Other Ambulatory Visit: Payer: Self-pay

## 2019-06-06 NOTE — Telephone Encounter (Signed)
Patient advised we need to change to televisit due to cough.

## 2019-06-07 ENCOUNTER — Ambulatory Visit (INDEPENDENT_AMBULATORY_CARE_PROVIDER_SITE_OTHER): Payer: Medicare Other | Admitting: Physician Assistant

## 2019-06-07 DIAGNOSIS — I890 Lymphedema, not elsewhere classified: Secondary | ICD-10-CM | POA: Diagnosis not present

## 2019-06-07 DIAGNOSIS — L03116 Cellulitis of left lower limb: Secondary | ICD-10-CM | POA: Diagnosis not present

## 2019-06-07 MED ORDER — CIPROFLOXACIN HCL 250 MG PO TABS: 250.0000 mg | ORAL_TABLET | Freq: Two times a day (BID) | ORAL | 0 refills | Status: DC

## 2019-06-07 MED ORDER — PREDNISONE 10 MG PO TABS: ORAL_TABLET | ORAL | 2 refills | Status: DC

## 2019-06-07 MED ORDER — BENZONATATE 200 MG PO CAPS: ORAL_CAPSULE | ORAL | 11 refills | Status: DC

## 2019-06-12 ENCOUNTER — Encounter: Payer: Self-pay | Admitting: Physician Assistant

## 2019-06-12 NOTE — Progress Notes (Signed)
Telephone visit  Subjective: PI:RJJOA, cellulitis, cough PCP: Terald Sleeper, PA-C [id number] [name] Courtney Berry is a 85 y.o. female calls for telephone consult today. Patient provides verbal consent for consult held via phone.  Patient is identified with 2 separate identifiers.  At this time the entire area is on COVID-19 social distancing and stay home orders are in place.  Patient is of higher risk and therefore we are performing this by a virtual method.  Location of patient: home Location of provider: [facility] Others present for call: daughter  This patient is having a visit for a recurrence of some upper respiratory symptoms she is having.  She does get a good amount of sinus infections over the year.  And she does get a lot of cough.  She has done very well with Ladona Ridgel in the past we will send a prescription in for this  She is also having a flareup of her lower leg cellulitis and edema.  She is using her lymphedema pumps however she has had a little more swelling in one ankle and it has caused her to have a little bit of break in the skin and redness.  Usually when it is like this we have done an antibiotic and a small amount of prednisone.   ROS: Per HPI  Allergies  Allergen Reactions  . Darvocet [Propoxyphene N-Acetaminophen] Other (See Comments)    unknown  . Nitrofurantoin Monohyd Macro Other (See Comments)    unknown  . Plavix [Clopidogrel Bisulfate] Swelling    Face swells  . Sulfa Antibiotics Other (See Comments)    "face got puffy"  . Penicillins Rash    Has patient had a PCN reaction causing immediate rash, facial/tongue/throat swelling, SOB or lightheadedness with hypotension: Yes Has patient had a PCN reaction causing severe rash involving mucus membranes or skin necrosis: No Has patient had a PCN reaction that required hospitalization: No Has patient had a PCN reaction occurring within the last 10 years: No If all of the above answers are "NO", then may  proceed with Cephalosporin use.    Past Medical History:  Diagnosis Date  . COPD (chronic obstructive pulmonary disease) ([place])   . Diverticulosis   . Essential hypertension, benign   . History of chest pain    Low risk Cardiolite 2010  . Lymphedema   . Mixed hyperlipidemia   . PAF (paroxysmal atrial fibrillation) ([place])   . Rheumatoid arthritis (HCC)     Current Outpatient Medications:  .  acetaminophen (TYLENOL) 500 MG tablet, Take 1,000 mg by mouth every 6 (six) hours as needed for moderate pain., Disp: , Rfl:  .  benzonatate (TESSALON) 200 MG capsule, TAKE 1 CAPSULE TWICE DAILY AS NEEDED FOR COUGH, Disp: 40 capsule, Rfl: 11 .  ciprofloxacin (CIPRO) 250 MG tablet, Take 1 tablet (250 mg total) by mouth 2 (two) times daily., Disp: 20 tablet, Rfl: 0 .  ELIQUIS 5 MG TABS tablet, TAKE ONE TABLET BY MOUTH TWICE DAILY, Disp: 60 tablet, Rfl: 6 .  fluticasone (FLONASE) 50 MCG/ACT nasal spray, Place 2 sprays into both nostrils daily., Disp: 16 g, Rfl: 6 .  gabapentin (NEURONTIN) 100 MG capsule, Take 2 capsules (200 mg total) by mouth at bedtime., Disp: 180 capsule, Rfl: 2 .  hydrochlorothiazide (HYDRODIURIL) 25 MG tablet, Take 1 tablet (25 mg total) by mouth daily., Disp: 90 tablet, Rfl: 0 .  lisinopril (ZESTRIL) 20 MG tablet, TAKE ONE (1) TABLET EACH DAY, Disp: 90 tablet, Rfl: 0 .  metoprolol succinate (TOPROL-XL) 25 MG 24 hr tablet, Take 1.5 tablets (37.5 mg total) by mouth 2 (two) times daily for 30 days., Disp: 90 tablet, Rfl: 6 .  predniSONE (DELTASONE) 10 MG tablet, TAKE 1 TABLET EVERY MORNING WITH BREAKFAST, Disp: 30 tablet, Rfl: 2 .  PROAIR HFA 108 (90 Base) MCG/ACT inhaler, Inhale 1-2 puffs into the lungs every 6 (six) hours as needed for wheezing or shortness of breath. , Disp: , Rfl:  .  umeclidinium-vilanterol (ANORO ELLIPTA) 62.5-25 MCG/INH AEPB, Inhale 1 puff into the lungs daily., Disp: , Rfl:   Assessment/ Plan: 85 y.o. female   1. Cellulitis of left lower extremity -  ciprofloxacin (CIPRO) 250 MG tablet; Take 1 tablet (250 mg total) by mouth 2 (two) times daily.  Dispense: 20 tablet; Refill: 0  2. Lymphedema of both lower extremities - ciprofloxacin (CIPRO) 250 MG tablet; Take 1 tablet (250 mg total) by mouth 2 (two) times daily.  Dispense: 20 tablet; Refill: 0 - predniSONE (DELTASONE) 10 MG tablet; TAKE 1 TABLET EVERY MORNING WITH BREAKFAST  Dispense: 30 tablet; Refill: 2   No follow-ups on file.  Continue all other maintenance medications as listed above.  Start time: 1:55 PM End time: 2:13 PM  Meds ordered this encounter  Medications  . ciprofloxacin (CIPRO) 250 MG tablet    Sig: Take 1 tablet (250 mg total) by mouth 2 (two) times daily.    Dispense:  20 tablet    Refill:  0    Order Specific Question:   Supervising Provider    Answer:   Raliegh Ip [[id number]]  . benzonatate (TESSALON) 200 MG capsule    Sig: TAKE 1 CAPSULE TWICE DAILY AS NEEDED FOR COUGH    Dispense:  40 capsule    Refill:  11    Order Specific Question:   Supervising Provider    Answer:   Raliegh Ip [[id number]]  . predniSONE (DELTASONE) 10 MG tablet    Sig: TAKE 1 TABLET EVERY MORNING WITH BREAKFAST    Dispense:  30 tablet    Refill:  2    Order Specific Question:   Supervising Provider    Answer:   Raliegh Ip [[id number]]    Prudy Feeler PA-C [facility] [phone]

## 2019-06-14 ENCOUNTER — Telehealth: Payer: Self-pay | Admitting: Cardiology

## 2019-06-14 NOTE — Telephone Encounter (Signed)
Patient will have bp , weight , and medication ready     Virtual Visit Pre-Appointment Phone Call  "(Name), I am calling you today to discuss your upcoming appointment. We are currently trying to limit exposure to the virus that causes COVID-19 by seeing patients at home rather than in the office."  1. "What is the BEST phone number to call the day of the visit?" - include this in appointment notes  2. Do you have or have access to (through a family member/friend) a smartphone with video capability that we can use for your visit?" a. If yes - list this number in appt notes as cell (if different from BEST phone #) and list the appointment type as a VIDEO visit in appointment notes b. If no - list the appointment type as a PHONE visit in appointment notes  3. Confirm consent - "In the setting of the current Covid19 crisis, you are scheduled for a (phone or video) visit with your provider on (date) at (time).  Just as we do with many in-office visits, in order for you to participate in this visit, we must obtain consent.  If you'd like, I can send this to your mychart (if signed up) or email for you to review.  Otherwise, I can obtain your verbal consent now.  All virtual visits are billed to your insurance company just like a normal visit would be.  By agreeing to a virtual visit, we'd like you to understand that the technology does not allow for your provider to perform an examination, and thus may limit your provider's ability to fully assess your condition. If your provider identifies any concerns that need to be evaluated in person, we will make arrangements to do so.  Finally, though the technology is pretty good, we cannot assure that it will always work on either your or our end, and in the setting of a video visit, we may have to convert it to a phone-only visit.  In either situation, we cannot ensure that we have a secure connection.  Are you willing to proceed?" STAFF: Did the patient  verbally acknowledge consent to telehealth visit? Document YES/NO here: yes  4. Advise patient to be prepared - "Two hours prior to your appointment, go ahead and check your blood pressure, pulse, oxygen saturation, and your weight (if you have the equipment to check those) and write them all down. When your visit starts, your provider will ask you for this information. If you have an Apple Watch or Kardia device, please plan to have heart rate information ready on the day of your appointment. Please have a pen and paper handy nearby the day of the visit as well."  5. Give patient instructions for MyChart download to smartphone OR Doximity/Doxy.me as below if video visit (depending on what platform provider is using)  6. Inform patient they will receive a phone call 15 minutes prior to their appointment time (may be from unknown caller ID) so they should be prepared to answer    TELEPHONE CALL NOTE  ORPAH HAUSNER has been deemed a candidate for a follow-up tele-health visit to limit community exposure during the Covid-19 pandemic. I spoke with the patient via phone to ensure availability of phone/video source, confirm preferred email & phone number, and discuss instructions and expectations.  I reminded CRYSTALE GIANNATTASIO to be prepared with any vital sign and/or heart rhythm information that could potentially be obtained via home monitoring, at the time of her visit.  I reminded SHALEIGH LAUBSCHER to expect a phone call prior to her visit.  Berle Mull 06/14/2019 12:21 PM   INSTRUCTIONS FOR DOWNLOADING THE MYCHART APP TO SMARTPHONE  - The patient must first make sure to have activated MyChart and know their login information - If Apple, go to [employer] and type in MyChart in the search bar and download the app. If Android, ask patient to go to [employer] and type in [place] in the search bar and download the app. The app is free but as with any other app downloads, their phone may  require them to verify saved payment information or Apple/Android password.  - The patient will need to then log into the app with their MyChart username and password, and select [facility] as their healthcare provider to link the account. When it is time for your visit, go to the MyChart app, find appointments, and click Begin Video Visit. Be sure to Select Allow for your device to access the Microphone and Camera for your visit. You will then be connected, and your provider will be with you shortly.  **If they have any issues connecting, or need assistance please contact MyChart service desk [phone])**  **If using a computer, in order to ensure the best quality for their visit they will need to use either of the following Internet Browsers: [employer] or Google Chrome**  IF USING DOXIMITY or DOXY.ME - The patient will receive a link just prior to their visit by text.     FULL LENGTH CONSENT FOR TELE-HEALTH VISIT   I hereby voluntarily request, consent and authorize [facility] and its employed or contracted physicians, physician assistants, nurse practitioners or other licensed health care professionals (the Practitioner), to provide me with telemedicine health care services (the Services") as deemed necessary by the treating Practitioner. I acknowledge and consent to receive the Services by the Practitioner via telemedicine. I understand that the telemedicine visit will involve communicating with the Practitioner through live audiovisual communication technology and the disclosure of certain medical information by electronic transmission. I acknowledge that I have been given the opportunity to request an in-person assessment or other available alternative prior to the telemedicine visit and am voluntarily participating in the telemedicine visit.  I understand that I have the right to withhold or withdraw my consent to the use of telemedicine in the course of my care at  any time, without affecting my right to future care or treatment, and that the Practitioner or I may terminate the telemedicine visit at any time. I understand that I have the right to inspect all information obtained and/or recorded in the course of the telemedicine visit and may receive copies of available information for a reasonable fee.  I understand that some of the potential risks of receiving the Services via telemedicine include:   Delay or interruption in medical evaluation due to technological equipment failure or disruption;  Information transmitted may not be sufficient (e.g. poor resolution of images) to allow for appropriate medical decision making by the Practitioner; and/or   In rare instances, security protocols could fail, causing a breach of personal health information.  Furthermore, I acknowledge that it is my responsibility to provide information about my medical history, conditions and care that is complete and accurate to the best of my ability. I acknowledge that Practitioner's advice, recommendations, and/or decision may be based on factors not within their control, such as incomplete or inaccurate data provided by me or distortions  of diagnostic images or specimens that may result from electronic transmissions. I understand that the practice of medicine is not an exact science and that Practitioner makes no warranties or guarantees regarding treatment outcomes. I acknowledge that I will receive a copy of this consent concurrently upon execution via email to the email address I last provided but may also request a printed copy by calling the office of [place].    I understand that my insurance will be billed for this visit.   I have read or had this consent read to me.  I understand the contents of this consent, which adequately explains the benefits and risks of the Services being provided via telemedicine.   I have been provided ample opportunity to ask questions  regarding this consent and the Services and have had my questions answered to my satisfaction.  I give my informed consent for the services to be provided through the use of telemedicine in my medical care  By participating in this telemedicine visit I agree to the above.

## 2019-06-15 ENCOUNTER — Telehealth: Payer: Self-pay | Admitting: Physician Assistant

## 2019-06-15 DIAGNOSIS — I890 Lymphedema, not elsewhere classified: Secondary | ICD-10-CM

## 2019-06-15 DIAGNOSIS — G629 Polyneuropathy, unspecified: Secondary | ICD-10-CM

## 2019-06-15 DIAGNOSIS — J441 Chronic obstructive pulmonary disease with (acute) exacerbation: Secondary | ICD-10-CM

## 2019-06-15 NOTE — Progress Notes (Signed)
Virtual Visit via Telephone Note   This visit type was conducted due to national recommendations for restrictions regarding the COVID-19 Pandemic (e.g. social distancing) in an effort to limit this patient's exposure and mitigate transmission in our community.  Due to her co-morbid illnesses, this patient is at least at moderate risk for complications without adequate follow up.  This format is felt to be most appropriate for this patient at this time.  The patient did not have access to video technology/had technical difficulties with video requiring transitioning to audio format only (telephone).  All issues noted in this document were discussed and addressed.  No physical exam could be performed with this format.  Please refer to the patient's chart for her  consent to telehealth for [facility].   Date:  06/16/2019   ID:  Courtney Berry, DOB 11/24/1932, MRN [id number]  Patient Location: Home Provider Location: Home  PCP:  Terald Sleeper, PA-C  Cardiologist:  Rozann Lesches, MD Electrophysiologist:  None   Evaluation Performed:  Follow-Up Visit  Chief Complaint:  Cardiac follow-up  History of Present Illness:    Courtney Berry is an 85 y.o. female last seen in January.  She did not have video access and we spoke by phone today.  She tells me that she has not had any increasing sense of palpitations.  She did have one episode in the interim and took an extra Toprol-XL.  Otherwise, she does not report any bleeding problems with Eliquis.  Lab work from February is outlined below.  The patient does not have symptoms concerning for COVID-19 infection (fever, chills, cough, or new shortness of breath).  She states that she and her husband have been social distancing, she wears a mask when she goes out.   Past Medical History:  Diagnosis Date  . COPD (chronic obstructive pulmonary disease) ([place])   . Diverticulosis   . Essential hypertension, benign   . History of chest pain    Low risk Cardiolite 2010  . Lymphedema   . Mixed hyperlipidemia   . PAF (paroxysmal atrial fibrillation) ([place])   . Rheumatoid arthritis [facility])    Past Surgical History:  Procedure Laterality Date  . TOTAL ABDOMINAL HYSTERECTOMY  1969     Current Meds  Medication Sig  . acetaminophen (TYLENOL) 500 MG tablet Take 1,000 mg by mouth every 6 (six) hours as needed for moderate pain.  . benzonatate (TESSALON) 200 MG capsule TAKE 1 CAPSULE TWICE DAILY AS NEEDED FOR COUGH  . ELIQUIS 5 MG TABS tablet TAKE ONE TABLET BY MOUTH TWICE DAILY  . fluticasone (FLONASE) 50 MCG/ACT nasal spray Place 2 sprays into both nostrils daily.  Marland Kitchen gabapentin (NEURONTIN) 100 MG capsule Take 100 mg by mouth at bedtime.  . hydrochlorothiazide (HYDRODIURIL) 25 MG tablet Take 1 tablet (25 mg total) by mouth daily.  Marland Kitchen lisinopril (ZESTRIL) 20 MG tablet TAKE ONE (1) TABLET EACH DAY  . metoprolol succinate (TOPROL-XL) 25 MG 24 hr tablet Take 1.5 tablets (37.5 mg total) by mouth 2 (two) times daily for 30 days.  . predniSONE (DELTASONE) 10 MG tablet TAKE 1 TABLET EVERY MORNING WITH BREAKFAST  . PROAIR HFA 108 (90 Base) MCG/ACT inhaler Inhale 1-2 puffs into the lungs every 6 (six) hours as needed for wheezing or shortness of breath.   . umeclidinium-vilanterol (ANORO ELLIPTA) 62.5-25 MCG/INH AEPB Inhale 1 puff into the lungs daily.  . [DISCONTINUED] gabapentin (NEURONTIN) 100 MG capsule Take 2 capsules (200 mg total) by mouth at  bedtime. (Patient taking differently: Take 100 mg by mouth at bedtime. )     Allergies:   Darvocet [propoxyphene n-acetaminophen], Nitrofurantoin monohyd macro, Plavix [clopidogrel bisulfate], Sulfa antibiotics, and Penicillins   Social History   Tobacco Use  . Smoking status: Never Smoker  . Smokeless tobacco: Never Used  . Tobacco comment: Tobacco use-no  Substance Use Topics  . Alcohol use: No    Alcohol/week: 0.0 standard drinks  . Drug use: No     Family Hx: The patient's family history  includes Cancer in her brother, father, and mother; Heart disease in her sister.  ROS:   Please see the history of present illness. All other systems reviewed and are negative.   Prior CV studies:   The following studies were reviewed today:  Lexiscan Myoview 08/30/2018:  There was no ST segment deviation noted during stress.  The study is normal. No ischemia or scar.  This is a low risk study.  Nuclear stress EF: 77%.  Labs/Other Tests and Data Reviewed:    EKG:  An ECG dated 08/18/2018 was personally reviewed today and demonstrated:  Sinus bradycardia.  Recent Labs: 08/01/2018: B Natriuretic Peptide 125.0; TSH 1.004 08/02/2018: Magnesium 1.7 12/29/2018: ALT 15; BUN 21; Creatinine, Ser 1.08; Hemoglobin 12.4; Platelets 182; Potassium 4.3; Sodium 142   Recent Lipid Panel Lab Results  Component Value Date/Time   CHOL 160 08/02/2018 04:17 AM   CHOL 213 (H) 04/15/2018 10:32 AM   TRIG 189 (H) 08/02/2018 04:17 AM   HDL 43 08/02/2018 04:17 AM   HDL 65 04/15/2018 10:32 AM   CHOLHDL 3.7 08/02/2018 04:17 AM   LDLCALC 79 08/02/2018 04:17 AM   LDLCALC 120 (H) 04/15/2018 10:32 AM    Wt Readings from Last 3 Encounters:  06/16/19 235 lb (106.6 kg)  12/29/18 237 lb 3.2 oz (107.6 kg)  11/24/18 235 lb (106.6 kg)     Objective:    Vital Signs:  BP (!) 137/50   Ht 5\' 6"  (1.676 m)   Wt 235 lb (106.6 kg)   BMI 37.93 kg/m    Spoke in full sentences, not short of breath. No audible wheezing or coughing. Speech pattern normal.  ASSESSMENT & PLAN:    1.  Paroxysmal atrial fibrillation.  Stable at this time without progressive palpitations and we will plan to continue Toprol-XL and Eliquis.  CBC and BMET for next visit.  I have considered flecainide if needed depending on symptom control, she had a low risk ischemic work-up.  2.  Essential hypertension, no changes to current regimen.  Keep follow-up with PCP.  3.  Chronic lymphedema.  She uses mechanical compression at home.   Reports no significant change recently.  Weight is stable  COVID-19 Education: The signs and symptoms of COVID-19 were discussed with the patient and how to seek care for testing (follow up with PCP or arrange E-visit).  The importance of social distancing was discussed today.  Time:   Today, I have spent 6 minutes with the patient with telehealth technology discussing the above problems.     Medication Adjustments/Labs and Tests Ordered: Current medicines are reviewed at length with the patient today.  Concerns regarding medicines are outlined above.   Tests Ordered: Orders Placed This Encounter  Procedures  . Basic Metabolic Panel (BMET)  . CBC    Medication Changes: No orders of the defined types were placed in this encounter.   Follow Up:  In Person 6 months in the [place] office.  Signed, [place],  MD  06/16/2019 10:45 AM    [facility]

## 2019-06-15 NOTE — Telephone Encounter (Signed)
Patient aware rx ready to be picked up 

## 2019-06-16 ENCOUNTER — Encounter: Payer: Self-pay | Admitting: Cardiology

## 2019-06-16 ENCOUNTER — Telehealth (INDEPENDENT_AMBULATORY_CARE_PROVIDER_SITE_OTHER): Payer: Medicare Other | Admitting: Cardiology

## 2019-06-16 VITALS — BP 137/50 | Ht 66.0 in | Wt 235.0 lb

## 2019-06-16 DIAGNOSIS — I480 Paroxysmal atrial fibrillation: Secondary | ICD-10-CM

## 2019-06-16 DIAGNOSIS — I10 Essential (primary) hypertension: Secondary | ICD-10-CM | POA: Diagnosis not present

## 2019-06-16 DIAGNOSIS — I890 Lymphedema, not elsewhere classified: Secondary | ICD-10-CM

## 2019-06-16 NOTE — Patient Instructions (Signed)
Your physician wants you to follow-up in: [place] will receive a reminder letter in the mail two months in advance. If you don't receive a letter, please call our office to schedule the follow-up appointment.  Your physician recommends that you continue on your current medications as directed. Please refer to the Current Medication list given to you today.  Your physician recommends that you return for lab work in: [place] - BMP/CBC - [place]   Thank you for choosing [place]!!

## 2019-07-11 ENCOUNTER — Other Ambulatory Visit: Payer: Self-pay | Admitting: Cardiology

## 2019-07-22 DIAGNOSIS — Z029 Encounter for administrative examinations, unspecified: Secondary | ICD-10-CM

## 2019-07-26 ENCOUNTER — Telehealth: Payer: Self-pay | Admitting: Physician Assistant

## 2019-07-26 ENCOUNTER — Other Ambulatory Visit: Payer: Self-pay | Admitting: Physician Assistant

## 2019-07-26 MED ORDER — DOXYCYCLINE HYCLATE 100 MG PO TABS: 100.0000 mg | ORAL_TABLET | Freq: Two times a day (BID) | ORAL | 6 refills | Status: DC

## 2019-07-26 NOTE — Telephone Encounter (Signed)
Aware ok , to bring by a pic of moms leg - no mychart available

## 2019-07-26 NOTE — Progress Notes (Unsigned)
Ojreahghfiorejgkfprwlg,mte;lkhmtrn  Wheelchair   7.21

## 2019-07-27 ENCOUNTER — Telehealth: Payer: Self-pay | Admitting: Physician Assistant

## 2019-07-27 NOTE — Telephone Encounter (Signed)
Prescription was written for wheelchair.  However the patient has not been into the office in the past 13months because of COVID restrictions.  What are the requirements for getting a wheelchair?  Do they require an in office face-to-face documentation?  Terald Sleeper PA-C [place] [address]  [place], [place] [address] [phone]

## 2019-07-28 NOTE — Telephone Encounter (Signed)
Please let the patient know that insurance requires a face to face for equipment ordering, such as wheelchairs. So we need an inoffice visit or VIDEO visit.

## 2019-07-28 NOTE — Telephone Encounter (Signed)
Pt will need a F2F visit either in office or virtual, video/audio visit

## 2019-07-28 NOTE — Telephone Encounter (Signed)
Appt made and patient notified and verbalized understanding

## 2019-08-05 ENCOUNTER — Other Ambulatory Visit: Payer: Self-pay

## 2019-08-08 ENCOUNTER — Other Ambulatory Visit: Payer: Self-pay

## 2019-08-08 ENCOUNTER — Encounter: Payer: Self-pay | Admitting: Physician Assistant

## 2019-08-08 ENCOUNTER — Ambulatory Visit (INDEPENDENT_AMBULATORY_CARE_PROVIDER_SITE_OTHER): Payer: Medicare Other | Admitting: Physician Assistant

## 2019-08-08 VITALS — BP 162/67 | HR 66 | Temp 98.6°F | Ht 66.0 in | Wt 246.4 lb

## 2019-08-08 DIAGNOSIS — M069 Rheumatoid arthritis, unspecified: Secondary | ICD-10-CM

## 2019-08-08 DIAGNOSIS — I890 Lymphedema, not elsewhere classified: Secondary | ICD-10-CM

## 2019-08-08 DIAGNOSIS — J449 Chronic obstructive pulmonary disease, unspecified: Secondary | ICD-10-CM | POA: Diagnosis not present

## 2019-08-08 DIAGNOSIS — I739 Peripheral vascular disease, unspecified: Secondary | ICD-10-CM | POA: Diagnosis not present

## 2019-08-08 DIAGNOSIS — I4891 Unspecified atrial fibrillation: Secondary | ICD-10-CM

## 2019-08-08 NOTE — Progress Notes (Signed)
BP (!) 162/67   Pulse 66   Temp 98.6 F (37 C) (Temporal)   Ht 5\' 6"  (1.676 m)   Wt 246 lb 6.4 oz (111.8 kg)   SpO2 96%   BMI 39.77 kg/m    Subjective:    Patient ID: Courtney Berry, female    DOB: [birth year], 85 y.o.   MRN: [id number]  HPI: Courtney Berry is a 85 y.o. female presenting on 08/08/2019 for face to face Administrator, [employer])  This patient comes in for a face-to-face encounter for a wheelchair.  She is accompanied by her daughter.  For many months the patient has not been able to leave her house hardly at all due to her poor mobility status.  She does have peripheral vascular disease, lymphedema, atrial fibrillation, COPD, rheumatoid arthritis.  She has had increased weakness throughout her body related to the arthritis and her vascular status.  At home she is doing no activities of daily living.  She can dress herself and get very short distances with holding onto things.  She does no cooking, laundry, etc.  The patient states that she has almost fallen many times.  She just tries to avoid being up if at all possible.  She did use a regular cane and had minimal control and no balance, it pulled her in the direction of the cane.  She tried the 4 prong cane and again was not well-balanced.  She has tried a walker, but is too physically weak to be able to maneuver the walker for any long distance.  At the most she obviously only moves 5 to 10 feet.  Past Medical History:  Diagnosis Date  . COPD (chronic obstructive pulmonary disease) ([place])   . Diverticulosis   . Essential hypertension, benign   . History of chest pain    Low risk Cardiolite 2010  . Lymphedema   . Mixed hyperlipidemia   . PAF (paroxysmal atrial fibrillation) ([place])   . Rheumatoid arthritis ([place])    Relevant past medical, surgical, family and social history reviewed and updated as indicated. Interim medical history since our last visit reviewed. Allergies and medications reviewed and updated. DATA  REVIEWED: CHART IN EPIC  Family History reviewed for pertinent findings.  Review of Systems  Constitutional: Positive for fatigue. Negative for activity change and fever.  HENT: Negative.   Eyes: Negative.   Respiratory: Positive for shortness of breath. Negative for cough and wheezing.   Cardiovascular: Positive for palpitations and leg swelling. Negative for chest pain.  Gastrointestinal: Negative.  Negative for abdominal pain.  Endocrine: Negative.   Genitourinary: Negative.  Negative for dysuria.  Musculoskeletal: Negative.   Skin: Positive for color change.  Neurological: Positive for weakness.    Allergies as of 08/08/2019      Reactions   Darvocet [propoxyphene N-acetaminophen] Other (See Comments)   unknown   Nitrofurantoin Monohyd Macro Other (See Comments)   unknown   Plavix [clopidogrel Bisulfate] Swelling   Face swells   Sulfa Antibiotics Other (See Comments)   "face got puffy"   Penicillins Rash   Has patient had a PCN reaction causing immediate rash, facial/tongue/throat swelling, SOB or lightheadedness with hypotension: Yes Has patient had a PCN reaction causing severe rash involving mucus membranes or skin necrosis: No Has patient had a PCN reaction that required hospitalization: No Has patient had a PCN reaction occurring within the last 10 years: No If all of the above answers are "NO", then may proceed with Cephalosporin use.  Medication List       Accurate as of August 08, 2019  5:03 PM. If you have any questions, ask your nurse or doctor.        acetaminophen 500 MG tablet Commonly known as: TYLENOL Take 1,000 mg by mouth every 6 (six) hours as needed for moderate pain.   Anoro Ellipta 62.5-25 MCG/INH Aepb Generic drug: umeclidinium-vilanterol Inhale 1 puff into the lungs daily.   benzonatate 200 MG capsule Commonly known as: TESSALON TAKE 1 CAPSULE TWICE DAILY AS NEEDED FOR COUGH   doxycycline 100 MG tablet Commonly known as:  VIBRA-TABS Take 1 tablet (100 mg total) by mouth 2 (two) times daily. After 10 days stay on 1 daily to prevent infection   Eliquis 5 MG Tabs tablet Generic drug: apixaban TAKE ONE TABLET BY MOUTH TWICE DAILY   fluticasone 50 MCG/ACT nasal spray Commonly known as: FLONASE Place 2 sprays into both nostrils daily.   gabapentin 100 MG capsule Commonly known as: NEURONTIN Take 100 mg by mouth at bedtime.   hydrochlorothiazide 25 MG tablet Commonly known as: HYDRODIURIL Take 1 tablet (25 mg total) by mouth daily.   lisinopril 20 MG tablet Commonly known as: ZESTRIL TAKE ONE (1) TABLET EACH DAY   metoprolol succinate 25 MG 24 hr tablet Commonly known as: TOPROL-XL TAKE 1 AND 1/2 TABLET TWICE A DAY   predniSONE 10 MG tablet Commonly known as: DELTASONE TAKE 1 TABLET EVERY MORNING WITH BREAKFAST   ProAir HFA 108 (90 Base) MCG/ACT inhaler Generic drug: albuterol Inhale 1-2 puffs into the lungs every 6 (six) hours as needed for wheezing or shortness of breath.          Objective:    BP (!) 162/67   Pulse 66   Temp 98.6 F (37 C) (Temporal)   Ht 5\' 6"  (1.676 m)   Wt 246 lb 6.4 oz (111.8 kg)   SpO2 96%   BMI 39.77 kg/m   Allergies  Allergen Reactions  . Darvocet [Propoxyphene N-Acetaminophen] Other (See Comments)    unknown  . Nitrofurantoin Monohyd Macro Other (See Comments)    unknown  . Plavix [Clopidogrel Bisulfate] Swelling    Face swells  . Sulfa Antibiotics Other (See Comments)    "face got puffy"  . Penicillins Rash    Has patient had a PCN reaction causing immediate rash, facial/tongue/throat swelling, SOB or lightheadedness with hypotension: Yes Has patient had a PCN reaction causing severe rash involving mucus membranes or skin necrosis: No Has patient had a PCN reaction that required hospitalization: No Has patient had a PCN reaction occurring within the last 10 years: No If all of the above answers are "NO", then may proceed with Cephalosporin use.      Wt Readings from Last 3 Encounters:  08/08/19 246 lb 6.4 oz (111.8 kg)  06/16/19 235 lb (106.6 kg)  12/29/18 237 lb 3.2 oz (107.6 kg)    Physical Exam Constitutional:      General: She is not in acute distress.    Appearance: Normal appearance. She is well-developed.  HENT:     Head: Normocephalic and atraumatic.  Cardiovascular:     Rate and Rhythm: Normal rate. Rhythm irregular.  Pulmonary:     Effort: Pulmonary effort is normal.     Breath sounds: Normal breath sounds and air entry.  Musculoskeletal:     Right lower leg: 3+ Edema present.     Left lower leg: 3+ Edema present.  Skin:    General: Skin is  warm and dry.     Findings: No rash.  Neurological:     Mental Status: She is alert and oriented to person, place, and time.     Motor: Weakness present.     Deep Tendon Reflexes: Reflexes are normal and symmetric.     Comments: Decreased leg extension strength bilaterally Gait is very slow with her daughter and cane.  To enter and exit the office frequent stops were necessary.         Assessment & Plan:   1. Atrial fibrillation with RVR (HCC) - DME Wheelchair manual  2. Rheumatoid arthritis, involving unspecified site, unspecified rheumatoid factor presence (HCC) - DME Wheelchair manual  3. Peripheral vascular disease (HCC) - DME Wheelchair manual  4. Lymphedema of both lower extremities - DME Wheelchair manual  5. Chronic obstructive pulmonary disease, unspecified COPD type (HCC) - DME Wheelchair manual   Continue all other maintenance medications as listed above.  Follow up plan: Return in about 6 months (around 02/05/2020).  Educational handout given for survey  Remus Loffler PA-C [facility] [address]  [place], [place] [address] [phone]   08/08/2019, 5:03 PM

## 2019-08-09 ENCOUNTER — Telehealth: Payer: Self-pay | Admitting: Physician Assistant

## 2019-08-09 NOTE — Telephone Encounter (Signed)
Labs Marthann Schiller faxed

## 2019-08-11 ENCOUNTER — Other Ambulatory Visit: Payer: Self-pay | Admitting: Physician Assistant

## 2019-08-22 ENCOUNTER — Other Ambulatory Visit: Payer: Self-pay | Admitting: Physician Assistant

## 2019-08-22 ENCOUNTER — Telehealth: Payer: Self-pay | Admitting: Physician Assistant

## 2019-08-22 MED ORDER — CEPHALEXIN 250 MG PO CAPS: 250.0000 mg | ORAL_CAPSULE | Freq: Three times a day (TID) | ORAL | 0 refills | Status: DC

## 2019-08-22 MED ORDER — ALPRAZOLAM 0.25 MG PO TABS: 0.2500 mg | ORAL_TABLET | Freq: Two times a day (BID) | ORAL | 0 refills | Status: DC | PRN

## 2019-08-22 NOTE — Telephone Encounter (Signed)
A prescription for Keflex has been sent to the pharmacy.  She has taken this in the past.  Also a prescription for alprazolam 0.25 mg is sent to the pharmacy for her to take as needed for anxiety.

## 2019-08-22 NOTE — Telephone Encounter (Signed)
Aware. 

## 2019-09-07 DIAGNOSIS — J441 Chronic obstructive pulmonary disease with (acute) exacerbation: Secondary | ICD-10-CM | POA: Diagnosis not present

## 2019-09-07 DIAGNOSIS — I4891 Unspecified atrial fibrillation: Secondary | ICD-10-CM | POA: Diagnosis not present

## 2019-09-07 DIAGNOSIS — M069 Rheumatoid arthritis, unspecified: Secondary | ICD-10-CM | POA: Diagnosis not present

## 2019-09-07 DIAGNOSIS — E669 Obesity, unspecified: Secondary | ICD-10-CM | POA: Diagnosis not present

## 2019-09-12 ENCOUNTER — Telehealth: Payer: Self-pay

## 2019-09-12 ENCOUNTER — Other Ambulatory Visit: Payer: Self-pay | Admitting: Physician Assistant

## 2019-09-12 MED ORDER — METOPROLOL SUCCINATE ER 25 MG PO TB24: ORAL_TABLET | ORAL | 3 refills | Status: DC

## 2019-09-12 MED ORDER — LISINOPRIL 20 MG PO TABS: ORAL_TABLET | ORAL | 3 refills | Status: DC

## 2019-09-12 NOTE — Telephone Encounter (Signed)
Refilled metoprolol and lisinopril

## 2019-09-16 ENCOUNTER — Telehealth: Payer: Self-pay | Admitting: Physician Assistant

## 2019-09-22 ENCOUNTER — Ambulatory Visit (INDEPENDENT_AMBULATORY_CARE_PROVIDER_SITE_OTHER): Payer: Medicare Other | Admitting: Physician Assistant

## 2019-09-22 ENCOUNTER — Other Ambulatory Visit: Payer: Self-pay

## 2019-09-22 ENCOUNTER — Ambulatory Visit (INDEPENDENT_AMBULATORY_CARE_PROVIDER_SITE_OTHER): Payer: Medicare Other

## 2019-09-22 ENCOUNTER — Encounter: Payer: Self-pay | Admitting: Physician Assistant

## 2019-09-22 VITALS — BP 131/62 | HR 78 | Temp 98.2°F | Ht 66.0 in | Wt 240.0 lb

## 2019-09-22 DIAGNOSIS — M79661 Pain in right lower leg: Secondary | ICD-10-CM | POA: Diagnosis not present

## 2019-09-22 DIAGNOSIS — S99922A Unspecified injury of left foot, initial encounter: Secondary | ICD-10-CM

## 2019-09-22 DIAGNOSIS — L03115 Cellulitis of right lower limb: Secondary | ICD-10-CM | POA: Diagnosis not present

## 2019-09-22 DIAGNOSIS — J441 Chronic obstructive pulmonary disease with (acute) exacerbation: Secondary | ICD-10-CM | POA: Diagnosis not present

## 2019-09-22 DIAGNOSIS — E785 Hyperlipidemia, unspecified: Secondary | ICD-10-CM | POA: Diagnosis not present

## 2019-09-22 DIAGNOSIS — S8991XA Unspecified injury of right lower leg, initial encounter: Secondary | ICD-10-CM

## 2019-09-22 DIAGNOSIS — M7989 Other specified soft tissue disorders: Secondary | ICD-10-CM | POA: Diagnosis not present

## 2019-09-22 DIAGNOSIS — I10 Essential (primary) hypertension: Secondary | ICD-10-CM | POA: Diagnosis not present

## 2019-09-22 DIAGNOSIS — M19071 Primary osteoarthritis, right ankle and foot: Secondary | ICD-10-CM | POA: Diagnosis not present

## 2019-09-22 MED ORDER — PROAIR HFA 108 (90 BASE) MCG/ACT IN AERS: 1.0000 | INHALATION_SPRAY | Freq: Four times a day (QID) | RESPIRATORY_TRACT | 0 refills | Status: DC | PRN

## 2019-09-22 MED ORDER — CEFDINIR 300 MG PO CAPS: 300.0000 mg | ORAL_CAPSULE | Freq: Two times a day (BID) | ORAL | 0 refills | Status: DC

## 2019-09-22 MED ORDER — HYDROCODONE-HOMATROPINE 5-1.5 MG/5ML PO SYRP: 5.0000 mL | ORAL_SOLUTION | Freq: Four times a day (QID) | ORAL | 0 refills | Status: DC | PRN

## 2019-09-23 LAB — LIPID PANEL
Chol/HDL Ratio: 3.6 ratio (ref 0.0–4.4)
Cholesterol, Total: 145 mg/dL (ref 100–199)
HDL: 40 mg/dL (ref >39)
LDL Chol Calc (NIH): 75 mg/dL (ref 0–99)
Triglycerides: 178 mg/dL — ABNORMAL HIGH (ref 0–149)
VLDL Cholesterol Cal: 30 mg/dL (ref 5–40)

## 2019-09-23 LAB — CBC WITH DIFFERENTIAL/PLATELET
Basophils Absolute: 0 10*3/uL (ref 0.0–0.2)
Basos: 0 %
EOS (ABSOLUTE): 0.2 10*3/uL (ref 0.0–0.4)
Eos: 2 %
Hematocrit: 36.4 % (ref 34.0–46.6)
Hemoglobin: 11.9 g/dL (ref 11.1–15.9)
Immature Grans (Abs): 0 10*3/uL (ref 0.0–0.1)
Immature Granulocytes: 0 %
Lymphocytes Absolute: 1.2 10*3/uL (ref 0.7–3.1)
Lymphs: 10 %
MCH: 31.2 pg (ref 26.6–33.0)
MCHC: 32.7 g/dL (ref 31.5–35.7)
MCV: 96 fL (ref 79–97)
Monocytes Absolute: 0.8 10*3/uL (ref 0.1–0.9)
Monocytes: 7 %
Neutrophils Absolute: 9.5 10*3/uL — ABNORMAL HIGH (ref 1.4–7.0)
Neutrophils: 81 %
Platelets: 218 10*3/uL (ref 150–450)
RBC: 3.81 x10E6/uL (ref 3.77–5.28)
RDW: 11.7 % (ref 11.7–15.4)
WBC: 11.8 10*3/uL — ABNORMAL HIGH (ref 3.4–10.8)

## 2019-09-23 LAB — CMP14+EGFR
ALT: 12 IU/L (ref 0–32)
AST: 13 IU/L (ref 0–40)
Albumin/Globulin Ratio: 1 — ABNORMAL LOW (ref 1.2–2.2)
Albumin: 3 g/dL — ABNORMAL LOW (ref 3.6–4.6)
Alkaline Phosphatase: 85 IU/L (ref 39–117)
BUN/Creatinine Ratio: 20 (ref 12–28)
BUN: 26 mg/dL (ref 8–27)
Bilirubin Total: 0.4 mg/dL (ref 0.0–1.2)
CO2: 27 mmol/L (ref 20–29)
Calcium: 8.7 mg/dL (ref 8.7–10.3)
Chloride: 102 mmol/L (ref 96–106)
Creatinine, Ser: 1.28 mg/dL — ABNORMAL HIGH (ref 0.57–1.00)
GFR calc Af Amer: 44 mL/min/{1.73_m2} — ABNORMAL LOW (ref >59)
GFR calc non Af Amer: 38 mL/min/{1.73_m2} — ABNORMAL LOW (ref >59)
Globulin, Total: 3.1 g/dL (ref 1.5–4.5)
Glucose: 163 mg/dL — ABNORMAL HIGH (ref 65–99)
Potassium: 4.2 mmol/L (ref 3.5–5.2)
Sodium: 142 mmol/L (ref 134–144)
Total Protein: 6.1 g/dL (ref 6.0–8.5)

## 2019-09-25 ENCOUNTER — Encounter: Payer: Self-pay | Admitting: Physician Assistant

## 2019-09-25 NOTE — Progress Notes (Signed)
BP 131/62   Pulse 78   Temp 98.2 F (36.8 C) (Temporal)   Ht 5' 6"  (1.676 m)   Wt 240 lb (108.9 kg)   SpO2 95%   BMI 38.74 kg/m    Subjective:    Patient ID: Courtney Berry, female    DOB: February 13, 1933, 85 y.o.   MRN: [id number]  HPI: Courtney Berry is a 85 y.o. female presenting on 09/22/2019 for Edema (in legs)  Patient is having significant swelling in her lower legs again.  She also slipped and fell in is just her right leg the most with a little bit of bruising still in the heel.  Is very tender.  She has tried not to weight-bear on it.  X-ray does not show any fractures but there is significant swelling.  I will have encouraged her to continue to avoid weightbearing.  We will also go ahead and treat her edema and cellulitis.  She had had a recent bronchitis with cough.  She denies having any fever or anything at this time.  She had been seen by Dr. Luan Pulling in the past for pulmonology.  He will be retiring soon.  Past Medical History:  Diagnosis Date  . COPD (chronic obstructive pulmonary disease) ([place])   . Diverticulosis   . Essential hypertension, benign   . History of chest pain    Low risk Cardiolite 2010  . Lymphedema   . Mixed hyperlipidemia   . PAF (paroxysmal atrial fibrillation) ([place])   . Rheumatoid arthritis ([place])    Relevant past medical, surgical, family and social history reviewed and updated as indicated. Interim medical history since our last visit reviewed. Allergies and medications reviewed and updated. DATA REVIEWED: CHART IN EPIC  Family History reviewed for pertinent findings.  Review of Systems  Constitutional: Negative.   HENT: Negative.   Eyes: Negative.   Respiratory: Negative.   Gastrointestinal: Negative.   Genitourinary: Negative.   Musculoskeletal: Positive for arthralgias, gait problem and joint swelling.  Skin: Positive for color change and rash.    Allergies as of 09/22/2019      Reactions   Darvocet [propoxyphene N-acetaminophen]  Other (See Comments)   unknown   Nitrofurantoin Monohyd Macro Other (See Comments)   unknown   Plavix [clopidogrel Bisulfate] Swelling   Face swells   Sulfa Antibiotics Other (See Comments)   "face got puffy"   Penicillins Rash   Has patient had a PCN reaction causing immediate rash, facial/tongue/throat swelling, SOB or lightheadedness with hypotension: Yes Has patient had a PCN reaction causing severe rash involving mucus membranes or skin necrosis: No Has patient had a PCN reaction that required hospitalization: No Has patient had a PCN reaction occurring within the last 10 years: No If all of the above answers are "NO", then may proceed with Cephalosporin use.      Medication List       Accurate as of September 22, 2019 11:59 PM. If you have any questions, ask your nurse or doctor.        STOP taking these medications   cephALEXin 250 MG capsule Commonly known as: KEFLEX Stopped by: Terald Sleeper, PA-C     TAKE these medications   acetaminophen 500 MG tablet Commonly known as: TYLENOL Take 1,000 mg by mouth every 6 (six) hours as needed for moderate pain.   ALPRAZolam 0.25 MG tablet Commonly known as: XANAX Take 1 tablet (0.25 mg total) by mouth 2 (two) times daily as needed for anxiety.  Anoro Ellipta 62.5-25 MCG/INH Aepb Generic drug: umeclidinium-vilanterol Inhale 1 puff into the lungs daily.   benzonatate 200 MG capsule Commonly known as: TESSALON TAKE 1 CAPSULE TWICE DAILY AS NEEDED FOR COUGH   cefdinir 300 MG capsule Commonly known as: OMNICEF Take 1 capsule (300 mg total) by mouth 2 (two) times daily. 1 po BID Started by: Terald Sleeper, PA-C   doxycycline 100 MG tablet Commonly known as: VIBRA-TABS Take 1 tablet (100 mg total) by mouth 2 (two) times daily. After 10 days stay on 1 daily to prevent infection   Eliquis 5 MG Tabs tablet Generic drug: apixaban TAKE ONE TABLET BY MOUTH TWICE DAILY   fluticasone 50 MCG/ACT nasal spray Commonly known as:  FLONASE Place 2 sprays into both nostrils daily.   gabapentin 100 MG capsule Commonly known as: NEURONTIN Take 100 mg by mouth at bedtime.   hydrochlorothiazide 25 MG tablet Commonly known as: HYDRODIURIL Take 1 tablet (25 mg total) by mouth daily.   HYDROcodone-homatropine 5-1.5 MG/5ML syrup Commonly known as: HYCODAN Take 5 mLs by mouth every 6 (six) hours as needed for cough. Started by: Terald Sleeper, PA-C   lisinopril 20 MG tablet Commonly known as: ZESTRIL TAKE ONE (1) TABLET EACH DAY   metoprolol succinate 25 MG 24 hr tablet Commonly known as: TOPROL-XL TAKE 1 AND 1/2 TABLET TWICE A DAY   predniSONE 10 MG tablet Commonly known as: DELTASONE TAKE 1 TABLET EVERY MORNING WITH BREAKFAST   ProAir HFA 108 (90 Base) MCG/ACT inhaler Generic drug: albuterol Inhale 1-2 puffs into the lungs every 6 (six) hours as needed for wheezing or shortness of breath.          Objective:    BP 131/62   Pulse 78   Temp 98.2 F (36.8 C) (Temporal)   Ht 5' 6"  (1.676 m)   Wt 240 lb (108.9 kg)   SpO2 95%   BMI 38.74 kg/m   Allergies  Allergen Reactions  . Darvocet [Propoxyphene N-Acetaminophen] Other (See Comments)    unknown  . Nitrofurantoin Monohyd Macro Other (See Comments)    unknown  . Plavix [Clopidogrel Bisulfate] Swelling    Face swells  . Sulfa Antibiotics Other (See Comments)    "face got puffy"  . Penicillins Rash    Has patient had a PCN reaction causing immediate rash, facial/tongue/throat swelling, SOB or lightheadedness with hypotension: Yes Has patient had a PCN reaction causing severe rash involving mucus membranes or skin necrosis: No Has patient had a PCN reaction that required hospitalization: No Has patient had a PCN reaction occurring within the last 10 years: No If all of the above answers are "NO", then may proceed with Cephalosporin use.     Wt Readings from Last 3 Encounters:  09/22/19 240 lb (108.9 kg)  08/08/19 246 lb 6.4 oz (111.8 kg)   06/16/19 235 lb (106.6 kg)    Physical Exam Constitutional:      General: She is not in acute distress.    Appearance: Normal appearance. She is well-developed.  HENT:     Head: Normocephalic and atraumatic.  Cardiovascular:     Rate and Rhythm: Normal rate.  Pulmonary:     Effort: Pulmonary effort is normal.  Musculoskeletal:     Right lower leg: 2+ Pitting Edema present.     Left lower leg: 2+ Pitting Edema present.     Right foot: Decreased range of motion. Tenderness present.       Feet:  Skin:  General: Skin is warm and dry.     Findings: Erythema and rash present.       Neurological:     Mental Status: She is alert and oriented to person, place, and time.     Deep Tendon Reflexes: Reflexes are normal and symmetric.     Results for orders placed or performed in visit on 09/22/19  CBC with Differential/Platelet  Result Value Ref Range   WBC 11.8 (H) 3.4 - 10.8 x10E3/uL   RBC 3.81 3.77 - 5.28 x10E6/uL   Hemoglobin 11.9 11.1 - 15.9 g/dL   Hematocrit 36.4 34.0 - 46.6 %   MCV 96 79 - 97 fL   MCH 31.2 26.6 - 33.0 pg   MCHC 32.7 31.5 - 35.7 g/dL   RDW 11.7 11.7 - 15.4 %   Platelets 218 150 - 450 x10E3/uL   Neutrophils 81 Not Estab. %   Lymphs 10 Not Estab. %   Monocytes 7 Not Estab. %   Eos 2 Not Estab. %   Basos 0 Not Estab. %   Neutrophils Absolute 9.5 (H) 1.4 - 7.0 x10E3/uL   Lymphocytes Absolute 1.2 0.7 - 3.1 x10E3/uL   Monocytes Absolute 0.8 0.1 - 0.9 x10E3/uL   EOS (ABSOLUTE) 0.2 0.0 - 0.4 x10E3/uL   Basophils Absolute 0.0 0.0 - 0.2 x10E3/uL   Immature Granulocytes 0 Not Estab. %   Immature Grans (Abs) 0.0 0.0 - 0.1 x10E3/uL  CMP14+EGFR  Result Value Ref Range   Glucose 163 (H) 65 - 99 mg/dL   BUN 26 8 - 27 mg/dL   Creatinine, Ser 1.28 (H) 0.57 - 1.00 mg/dL   GFR calc non Af Amer 38 (L) >59 mL/min/1.73   GFR calc Af Amer 44 (L) >59 mL/min/1.73   BUN/Creatinine Ratio 20 12 - 28   Sodium 142 134 - 144 mmol/L   Potassium 4.2 3.5 - 5.2 mmol/L    Chloride 102 96 - 106 mmol/L   CO2 27 20 - 29 mmol/L   Calcium 8.7 8.7 - 10.3 mg/dL   Total Protein 6.1 6.0 - 8.5 g/dL   Albumin 3.0 (L) 3.6 - 4.6 g/dL   Globulin, Total 3.1 1.5 - 4.5 g/dL   Albumin/Globulin Ratio 1.0 (L) 1.2 - 2.2   Bilirubin Total 0.4 0.0 - 1.2 mg/dL   Alkaline Phosphatase 85 39 - 117 IU/L   AST 13 0 - 40 IU/L   ALT 12 0 - 32 IU/L  Lipid Panel  Result Value Ref Range   Cholesterol, Total 145 100 - 199 mg/dL   Triglycerides 178 (H) 0 - 149 mg/dL   HDL 40 >39 mg/dL   VLDL Cholesterol Cal 30 5 - 40 mg/dL   LDL Chol Calc (NIH) 75 0 - 99 mg/dL   Chol/HDL Ratio 3.6 0.0 - 4.4 ratio      Assessment & Plan:   1. Foot injury, left, initial encounter - DG Foot Complete Right; Future  2. Right leg injury, initial encounter - DG Tibia/Fibula Right; Future  3. COPD exacerbation (HCC) - PROAIR HFA 108 (90 Base) MCG/ACT inhaler; Inhale 1-2 puffs into the lungs every 6 (six) hours as needed for wheezing or shortness of breath.  Dispense: 18 g; Refill: 0  4. Essential hypertension - CBC with Differential/Platelet - CMP14+EGFR - Lipid Panel  5. Dyslipidemia - CBC with Differential/Platelet - CMP14+EGFR - Lipid Panel  6. Cellulitis of right lower extremity - cefdinir (OMNICEF) 300 MG capsule; Take 1 capsule (300 mg total) by mouth 2 (two)  times daily. 1 po BID  Dispense: 20 capsule; Refill: 0   Continue all other maintenance medications as listed above.  Follow up plan: No follow-ups on file.  Educational handout given for [place] PA-C [place] [address]  [place], [place] [address] [phone]   09/25/2019, 10:06 PM

## 2019-09-29 ENCOUNTER — Telehealth: Payer: Self-pay | Admitting: Physician Assistant

## 2019-09-29 NOTE — Telephone Encounter (Signed)
Patient was seen 11/5 and was given cefdinir.  Daughter states since she started ABX and taking her gabapentin daily regular x 1 week that she has been acting different. States she has been very mean and disoriented. Daughter is worried it is from her gabapentin or abx.  States she has been on the gabapentin for awhile but have not started to take it regular since the last week. Aware if patient gets worse she needs to be evaluated - verbalizes understanding.    States they would like to know if Glenard Haring can fit her mom in tomorrow to be seen?  States that patient only wants to seen [place].  Sending to Coverage and PCP.

## 2019-09-29 NOTE — Chronic Care Management (AMB) (Signed)
°  Chronic Care Management   Outreach Note  09/29/2019 Name: [name] MRN: [id number] DOB: 1933-06-25  Referred by: Terald Sleeper, PA-C Reason for referral : Chronic Care Management (Initial CCM outreach )   An unsuccessful telephone outreach was attempted today. The patient was referred to the case management team by for assistance with care management and care coordination.   Follow Up Plan: The care management team will reach out to the patient again over the next 7 days.     [place], [place] [id number] Direct Dial: [email or web address]  Website: [email or web address]

## 2019-09-29 NOTE — Telephone Encounter (Signed)
Defer to PCP since she will be in tomorrow.  Thanks,  WS

## 2019-09-29 NOTE — Telephone Encounter (Signed)
Hold gabapentin first

## 2019-09-30 NOTE — Telephone Encounter (Signed)
Daughter states that since starting the cefdinir she started getting sores in her mouth and then Monday, Tuesday she started itching. Then she started becoming disoriented. She is also having trouble walking to the bathroom. Daughter states the problem did not start until she started the antibiotics

## 2019-09-30 NOTE — Telephone Encounter (Signed)
Then hold the [facility]

## 2019-09-30 NOTE — Telephone Encounter (Addendum)
Patient daughter aware. Patients daughter advised if she does not see any improvement or if she gets worse she needs to take patient to be evaluated by the ER.

## 2019-10-03 ENCOUNTER — Telehealth: Payer: Self-pay | Admitting: Physician Assistant

## 2019-10-03 NOTE — Telephone Encounter (Signed)
Pts daughter  aware and voiced understanding 

## 2019-10-03 NOTE — Telephone Encounter (Signed)
Pt's daughter says she is somewhat better but now has multiple blisters on leg and daughter says her confusion is better but still there. Should she start back on antibiotics now that she has blisters or what does she need to do. Tried to offer appt with another provider but she declined.She wants to be seen in office with you, and only you but doesn't want to wait till Friday. I don't see anything other than Friday in an acute slot so I did put her in your 4:10 Friday. Is this ok? Any other advice?

## 2019-10-03 NOTE — Telephone Encounter (Signed)
Ok, and do start back on the antibiotics

## 2019-10-05 ENCOUNTER — Telehealth: Payer: Self-pay | Admitting: Physician Assistant

## 2019-10-05 ENCOUNTER — Other Ambulatory Visit: Payer: Self-pay | Admitting: Physician Assistant

## 2019-10-05 ENCOUNTER — Encounter: Payer: Self-pay | Admitting: Physician Assistant

## 2019-10-05 MED ORDER — DOXYCYCLINE HYCLATE 100 MG PO TABS: 100.0000 mg | ORAL_TABLET | Freq: Two times a day (BID) | ORAL | 0 refills | Status: DC

## 2019-10-05 NOTE — Telephone Encounter (Signed)
Patient aware and verbalized understanding. °

## 2019-10-05 NOTE — Telephone Encounter (Signed)
Sent doxycycline BID 10 days, with food

## 2019-10-06 ENCOUNTER — Other Ambulatory Visit: Payer: Self-pay

## 2019-10-07 ENCOUNTER — Ambulatory Visit: Payer: Medicare Other | Admitting: Physician Assistant

## 2019-10-11 ENCOUNTER — Ambulatory Visit (INDEPENDENT_AMBULATORY_CARE_PROVIDER_SITE_OTHER): Payer: Medicare Other | Admitting: Physician Assistant

## 2019-10-11 DIAGNOSIS — R110 Nausea: Secondary | ICD-10-CM | POA: Diagnosis not present

## 2019-10-11 DIAGNOSIS — L03115 Cellulitis of right lower limb: Secondary | ICD-10-CM

## 2019-10-11 MED ORDER — DOXYCYCLINE HYCLATE 100 MG PO TABS: 100.0000 mg | ORAL_TABLET | Freq: Two times a day (BID) | ORAL | 0 refills | Status: DC

## 2019-10-11 MED ORDER — PROMETHAZINE HCL 12.5 MG PO TABS: 12.5000 mg | ORAL_TABLET | Freq: Three times a day (TID) | ORAL | 0 refills | Status: DC | PRN

## 2019-10-16 ENCOUNTER — Encounter: Payer: Self-pay | Admitting: Physician Assistant

## 2019-10-16 NOTE — Progress Notes (Signed)
Telephone visit  Subjective: CC: Cellulitis continues, nausea PCP: Remus Loffler, PA-C [id number] Courtney Berry is a 85 y.o. female calls for telephone consult today. Patient provides verbal consent for consult held via phone.  Patient is identified with 2 separate identifiers.  At this time the entire area is on COVID-19 social distancing and stay home orders are in place.  Patient is of higher risk and therefore we are performing this by a virtual method.  Location of patient: Home Location of provider: HOME Others present for call: Daughter  At this time the patient continues with lower leg swelling and erythema.  There is some drainage.  She denies any fever or chills.  However she is starting to feel like she cannot get rid of the nausea.  And that occurred when she took [place].  I am to have her use a little bit of scintigram to see if we can get this calm down and then change her antibiotic to doxycycline.  She is strongly encouraged to always take the doxycycline with food.  She will call us back if there are any difficulties.   ROS: Per HPI  Allergies  Allergen Reactions  . Darvocet [Propoxyphene N-Acetaminophen] Other (See Comments)    unknown  . Nitrofurantoin Monohyd Macro Other (See Comments)    unknown  . Omnicef [Cefdinir]     Sick, rash  . Plavix [Clopidogrel Bisulfate] Swelling    Face swells  . Sulfa Antibiotics Other (See Comments)    "face got puffy"  . Penicillins Rash    Has patient had a PCN reaction causing immediate rash, facial/tongue/throat swelling, SOB or lightheadedness with hypotension: Yes Has patient had a PCN reaction causing severe rash involving mucus membranes or skin necrosis: No Has patient had a PCN reaction that required hospitalization: No Has patient had a PCN reaction occurring within the last 10 years: No If all of the above answers are "NO", then may proceed with Cephalosporin use.    Past Medical History:  Diagnosis Date  .  COPD (chronic obstructive pulmonary disease) (H[facility])   . Diverticulosis   . Essential hypertension, benign   . History of chest pain    Low risk Cardiolite 2010  . Lymphedema   . Mixed hyperlipidemia   . PAF (paroxysmal atrial fibrillation) ([facility])   . Rheumatoid arthritis ([facility])     Current Outpatient Medications:  .  acetaminophen (TYLENOL) 500 MG tablet, Take 1,000 mg by mouth every 6 (six) hours as needed for moderate pain., Disp: , Rfl:  .  ALPRAZolam (XANAX) 0.25 MG tablet, Take 1 tablet (0.25 mg total) by mouth 2 (two) times daily as needed for anxiety., Disp: 20 tablet, Rfl: 0 .  benzonatate (TESSALON) 200 MG capsule, TAKE 1 CAPSULE TWICE DAILY AS NEEDED FOR COUGH, Disp: 40 capsule, Rfl: 11 .  doxycycline (VIBRA-TABS) 100 MG tablet, Take 1 tablet (100 mg total) by mouth 2 (two) times daily. 1 po bid, Disp: 20 tablet, Rfl: 0 .  ELIQUIS 5 MG TABS tablet, TAKE ONE TABLET BY MOUTH TWICE DAILY, Disp: 60 tablet, Rfl: 6 .  fluticasone (FLONASE) 50 MCG/ACT nasal spray, Place 2 sprays into both nostrils daily., Disp: 16 g, Rfl: 6 .  gabapentin (NEURONTIN) 100 MG capsule, Take 100 mg by mouth at bedtime., Disp: , Rfl:  .  hydrochlorothiazide (HYDRODIURIL) 25 MG tablet, Take 1 tablet (25 mg total) by mouth daily., Disp: 90 tablet, Rfl: 0 .  HYDROcodone-homatropine (HYCODAN) 5-1.5 MG/5ML syrup, Take 5 mLs by  mouth every 6 (six) hours as needed for cough., Disp: 120 mL, Rfl: 0 .  lisinopril (ZESTRIL) 20 MG tablet, TAKE ONE (1) TABLET EACH DAY, Disp: 90 tablet, Rfl: 3 .  metoprolol succinate (TOPROL-XL) 25 MG 24 hr tablet, TAKE 1 AND 1/2 TABLET TWICE A DAY, Disp: 270 tablet, Rfl: 3 .  predniSONE (DELTASONE) 10 MG tablet, TAKE 1 TABLET EVERY MORNING WITH BREAKFAST, Disp: 30 tablet, Rfl: 2 .  PROAIR HFA 108 (90 Base) MCG/ACT inhaler, Inhale 1-2 puffs into the lungs every 6 (six) hours as needed for wheezing or shortness of breath., Disp: 18 g, Rfl: 0 .  promethazine (PHENERGAN) 12.5 MG tablet, Take 1  tablet (12.5 mg total) by mouth every 8 (eight) hours as needed for nausea or vomiting., Disp: 20 tablet, Rfl: 0 .  umeclidinium-vilanterol (ANORO ELLIPTA) 62.5-25 MCG/INH AEPB, Inhale 1 puff into the lungs daily., Disp: , Rfl:   Assessment/ Plan: 85 y.o. female   1. Cellulitis of right lower extremity - doxycycline (VIBRA-TABS) 100 MG tablet; Take 1 tablet (100 mg total) by mouth 2 (two) times daily. 1 po bid  Dispense: 20 tablet; Refill: 0  2. Nausea - promethazine (PHENERGAN) 12.5 MG tablet; Take 1 tablet (12.5 mg total) by mouth every 8 (eight) hours as needed for nausea or vomiting.  Dispense: 20 tablet; Refill: 0   No follow-ups on file.  Continue all other maintenance medications as listed above.  Start time: 8:23 AM End time: 8:34 AM  Meds ordered this encounter  Medications  . promethazine (PHENERGAN) 12.5 MG tablet    Sig: Take 1 tablet (12.5 mg total) by mouth every 8 (eight) hours as needed for nausea or vomiting.    Dispense:  20 tablet    Refill:  0    Order Specific Question:   Supervising Provider    Answer:   Janora Norlander [[id number]]  . doxycycline (VIBRA-TABS) 100 MG tablet    Sig: Take 1 tablet (100 mg total) by mouth 2 (two) times daily. 1 po bid    Dispense:  20 tablet    Refill:  0    Order Specific Question:   Supervising Provider    Answer:   Janora Norlander [[id number]]    Particia Nearing PA-C [facility] [phone]

## 2019-10-17 ENCOUNTER — Other Ambulatory Visit: Payer: Self-pay

## 2019-10-18 ENCOUNTER — Ambulatory Visit: Payer: Medicare Other | Admitting: Physician Assistant

## 2019-10-18 ENCOUNTER — Emergency Department (HOSPITAL_COMMUNITY): Payer: Medicare Other

## 2019-10-18 ENCOUNTER — Encounter (HOSPITAL_COMMUNITY): Payer: Self-pay | Admitting: Radiology

## 2019-10-18 ENCOUNTER — Other Ambulatory Visit: Payer: Self-pay

## 2019-10-18 ENCOUNTER — Emergency Department (HOSPITAL_COMMUNITY)
Admission: EM | Admit: 2019-10-18 | Discharge: 2019-10-18 | Disposition: A | Discharge status: Home / Self Care | Payer: Medicare Other | Attending: Emergency Medicine | Admitting: Emergency Medicine

## 2019-10-18 DIAGNOSIS — Z8719 Personal history of other diseases of the digestive system: Secondary | ICD-10-CM | POA: Diagnosis not present

## 2019-10-18 DIAGNOSIS — R531 Weakness: Secondary | ICD-10-CM | POA: Insufficient documentation

## 2019-10-18 DIAGNOSIS — Z79899 Other long term (current) drug therapy: Secondary | ICD-10-CM | POA: Diagnosis not present

## 2019-10-18 DIAGNOSIS — J449 Chronic obstructive pulmonary disease, unspecified: Secondary | ICD-10-CM | POA: Insufficient documentation

## 2019-10-18 DIAGNOSIS — R5383 Other fatigue: Secondary | ICD-10-CM | POA: Insufficient documentation

## 2019-10-18 DIAGNOSIS — Z7901 Long term (current) use of anticoagulants: Secondary | ICD-10-CM | POA: Diagnosis not present

## 2019-10-18 DIAGNOSIS — I10 Essential (primary) hypertension: Secondary | ICD-10-CM | POA: Diagnosis not present

## 2019-10-18 DIAGNOSIS — R05 Cough: Secondary | ICD-10-CM | POA: Diagnosis not present

## 2019-10-18 LAB — COMPREHENSIVE METABOLIC PANEL
ALT: 21 U/L (ref 0–44)
AST: 29 U/L (ref 15–41)
Albumin: 2.5 g/dL — ABNORMAL LOW (ref 3.5–5.0)
Alkaline Phosphatase: 108 U/L (ref 38–126)
Anion gap: 9 (ref 5–15)
BUN: 31 mg/dL — ABNORMAL HIGH (ref 8–23)
CO2: 31 mmol/L (ref 22–32)
Calcium: 8.9 mg/dL (ref 8.9–10.3)
Chloride: 102 mmol/L (ref 98–111)
Creatinine, Ser: 1.26 mg/dL — ABNORMAL HIGH (ref 0.44–1.00)
GFR calc Af Amer: 45 mL/min — ABNORMAL LOW (ref >60)
GFR calc non Af Amer: 39 mL/min — ABNORMAL LOW (ref >60)
Glucose, Bld: 103 mg/dL — ABNORMAL HIGH (ref 70–99)
Potassium: 4.8 mmol/L (ref 3.5–5.1)
Sodium: 142 mmol/L (ref 135–145)
Total Bilirubin: 1.2 mg/dL (ref 0.3–1.2)
Total Protein: 6.1 g/dL — ABNORMAL LOW (ref 6.5–8.1)

## 2019-10-18 LAB — URINALYSIS, ROUTINE W REFLEX MICROSCOPIC
Bilirubin Urine: NEGATIVE
Glucose, UA: NEGATIVE mg/dL
Ketones, ur: NEGATIVE mg/dL
Leukocytes,Ua: NEGATIVE
Nitrite: NEGATIVE
Protein, ur: NEGATIVE mg/dL
Specific Gravity, Urine: 1.021 (ref 1.005–1.030)
pH: 5 (ref 5.0–8.0)

## 2019-10-18 LAB — CBC WITH DIFFERENTIAL/PLATELET
Abs Immature Granulocytes: 0.03 10*3/uL (ref 0.00–0.07)
Basophils Absolute: 0 10*3/uL (ref 0.0–0.1)
Basophils Relative: 0 %
Eosinophils Absolute: 0.5 10*3/uL (ref 0.0–0.5)
Eosinophils Relative: 4 %
HCT: 44.3 % (ref 36.0–46.0)
Hemoglobin: 13.1 g/dL (ref 12.0–15.0)
Immature Granulocytes: 0 %
Lymphocytes Relative: 34 %
Lymphs Abs: 3.9 10*3/uL (ref 0.7–4.0)
MCH: 30.3 pg (ref 26.0–34.0)
MCHC: 29.6 g/dL — ABNORMAL LOW (ref 30.0–36.0)
MCV: 102.5 fL — ABNORMAL HIGH (ref 80.0–100.0)
Monocytes Absolute: 1.2 10*3/uL — ABNORMAL HIGH (ref 0.1–1.0)
Monocytes Relative: 10 %
Neutro Abs: 6.1 10*3/uL (ref 1.7–7.7)
Neutrophils Relative %: 52 %
Platelets: 207 10*3/uL (ref 150–400)
RBC: 4.32 MIL/uL (ref 3.87–5.11)
RDW: 12 % (ref 11.5–15.5)
WBC: 11.7 10*3/uL — ABNORMAL HIGH (ref 4.0–10.5)
nRBC: 0 % (ref 0.0–0.2)

## 2019-10-18 LAB — LIPASE, BLOOD: Lipase: 47 U/L (ref 11–51)

## 2019-10-18 NOTE — ED Triage Notes (Signed)
Patient arrived to ED via [facility] from home due to "weakness" for 5 weeks.  Patient reports a sprained ankle 2 weeks ago as well rectal bleeding for an unknown amount of time.  Patient reports afib and on blood thinners.  Patient is alert and oriented with bilateral pitting edema to legs.

## 2019-10-18 NOTE — ED Provider Notes (Signed)
[facility] EMERGENCY DEPARTMENT Provider Note   CSN: [id number] Arrival date & time: 10/18/19  1042     History   Chief Complaint Chief Complaint  Patient presents with  . Weakness    HPI Courtney Berry is a 85 y.o. female.     Patient [facility] from home due to weakness.  Apparently ongoing for about 5 weeks patient's daughter is here.  States that she has not been quite right for about 5 weeks.  Also did have a fall 2 weeks ago had x-rays done no fracture was designated as a bad sprain.  Also daughter talks about intermittent rectal bleeding small amounts at a time.  This also been going on for several weeks.  Patient has a history of atrial fib and is on the blood thinner Eliquis.  Patient is also had been significant swelling to the legs now for a while.  Developed redness in both of those.  Primary care doctor is start her on antibiotic for that and also may be for a persistent cough.  Patient does have a history of COPD.  Patient is also on gabapentin due to some of the symptoms they stopped that about a week ago.  Patient was then switched to doxycycline after being on Omnicef.  Currently on the doxycycline.  Past medical history as mentioned significant for atrial fibrillation rheumatoid arthritis COPD and hypertension.  Also a history of diverticulosis.     Past Medical History:  Diagnosis Date  . COPD (chronic obstructive pulmonary disease) (HCC)   . Diverticulosis   . Essential hypertension, benign   . History of chest pain    Low risk Cardiolite 2010  . Lymphedema   . Mixed hyperlipidemia   . PAF (paroxysmal atrial fibrillation) (HCC)   . Rheumatoid arthritis [facility](HCC)     Patient Active Problem List   Diagnosis Date Noted  . Neuropathy 03/02/2019  . PAF (paroxysmal atrial fibrillation) (HCC) 08/01/2018  . COPD (chronic obstructive pulmonary disease) (HCC) 08/01/2018  . Lymphedema 08/31/2017  . Lymphedema of both lower extremities 02/09/2017  .  Hypotension due to drugs 11/24/2016  . Pain in both lower legs 11/24/2016  . Rheumatoid arthritis (HCC) 10/20/2016  . Peripheral vascular disease (HCC) 10/20/2016  . Atrial fibrillation with RVR (HCC) 08/01/2016  . Bradycardia 08/01/2016  . Chronic anticoagulation 02/14/2016  . Palpitations 03/02/2012  . Dyslipidemia 09/03/2009  . Essential hypertension 09/03/2009  . Shortness of breath 09/03/2009    Past Surgical History:  Procedure Laterality Date  . TOTAL ABDOMINAL HYSTERECTOMY  1969     OB History   No obstetric history on file.      Home Medications    Prior to Admission medications   Medication Sig Start Date End Date Taking? Authorizing Provider  acetaminophen (TYLENOL) 500 MG tablet Take 1,000 mg by mouth every 6 (six) hours as needed for moderate pain.    [provider]  ALPRAZolam Prudy Feeler(XANAX) 0.25 MG tablet Take 1 tablet (0.25 mg total) by mouth 2 (two) times daily as needed for anxiety. 08/22/19   Remus LofflerJones, Angel S, PA-C  benzonatate (TESSALON) 200 MG capsule TAKE 1 CAPSULE TWICE DAILY AS NEEDED FOR COUGH 06/07/19   Remus LofflerJones, Angel S, PA-C  doxycycline (VIBRA-TABS) 100 MG tablet Take 1 tablet (100 mg total) by mouth 2 (two) times daily. 1 po bid 10/11/19   Remus LofflerJones, Angel S, PA-C  ELIQUIS 5 MG TABS tablet TAKE ONE TABLET BY MOUTH TWICE DAILY 04/08/19   Diona BrownerMcDowell,  Illene Bolus, MD  fluticasone (FLONASE) 50 MCG/ACT nasal spray Place 2 sprays into both nostrils daily. 04/25/19   Sonny Masters, FNP  gabapentin (NEURONTIN) 100 MG capsule Take 100 mg by mouth at bedtime.    [provider]  hydrochlorothiazide (HYDRODIURIL) 25 MG tablet Take 1 tablet (25 mg total) by mouth daily. 12/29/18   Remus Loffler, PA-C  HYDROcodone-homatropine (HYCODAN) 5-1.5 MG/5ML syrup Take 5 mLs by mouth every 6 (six) hours as needed for cough. 09/22/19   Remus Loffler, PA-C  lisinopril (ZESTRIL) 20 MG tablet TAKE ONE (1) TABLET EACH DAY 09/12/19   Jonelle Sidle, MD  metoprolol succinate  (TOPROL-XL) 25 MG 24 hr tablet TAKE 1 AND 1/2 TABLET TWICE A DAY 09/12/19   Jonelle Sidle, MD  predniSONE (DELTASONE) 10 MG tablet TAKE 1 TABLET EVERY MORNING WITH BREAKFAST 06/07/19   Remus Loffler, PA-C  PROAIR HFA [phone] Base) MCG/ACT inhaler Inhale 1-2 puffs into the lungs every 6 (six) hours as needed for wheezing or shortness of breath. 09/22/19   Remus Loffler, PA-C  promethazine (PHENERGAN) 12.5 MG tablet Take 1 tablet (12.5 mg total) by mouth every 8 (eight) hours as needed for nausea or vomiting. 10/11/19   Remus Loffler, PA-C  umeclidinium-vilanterol (ANORO ELLIPTA) 62.5-25 MCG/INH AEPB Inhale 1 puff into the lungs daily.    [provider]    Family History Family History  Problem Relation Age of Onset  . Cancer Father   . Cancer Mother   . Heart disease Sister        CABG age 4  . Cancer Brother     Social History Social History   Tobacco Use  . Smoking status: Never Smoker  . Smokeless tobacco: Never Used  . Tobacco comment: Tobacco use-no  Substance Use Topics  . Alcohol use: No    Alcohol/week: 0.0 standard drinks  . Drug use: No     Allergies   Darvocet [propoxyphene n-acetaminophen], Nitrofurantoin monohyd macro, Omnicef [cefdinir], Plavix [clopidogrel bisulfate], Sulfa antibiotics, and Penicillins   Review of Systems Review of Systems  Constitutional: Positive for fatigue. Negative for chills and fever.  HENT: Negative for congestion, rhinorrhea and sore throat.   Eyes: Negative for visual disturbance.  Respiratory: Positive for cough. Negative for shortness of breath.   Cardiovascular: Negative for chest pain and leg swelling.  Gastrointestinal: Positive for blood in stool. Negative for abdominal pain, diarrhea, nausea and vomiting.  Genitourinary: Negative for dysuria.  Musculoskeletal: Negative for back pain and neck pain.  Skin: Negative for rash.  Neurological: Positive for weakness. Negative for dizziness, light-headedness and  headaches.  Hematological: Does not bruise/bleed easily.  Psychiatric/Behavioral: Negative for confusion.     Physical Exam Updated Vital Signs BP 106/86   Pulse 70   Temp 98.3 F (36.8 C) (Oral)   Resp (!) 22   Ht 1.651 m (5\' 5" )   Wt 104.3 kg   SpO2 96%   BMI 38.27 kg/m   Physical Exam Vitals signs and nursing note reviewed.  Constitutional:      General: She is not in acute distress.    Appearance: Normal appearance. She is well-developed.  HENT:     Head: Normocephalic and atraumatic.     Mouth/Throat:     Mouth: Mucous membranes are dry.  Eyes:     Extraocular Movements: Extraocular movements intact.     Conjunctiva/sclera: Conjunctivae normal.     Pupils: Pupils are equal, round, and reactive  to light.  Neck:     Musculoskeletal: Normal range of motion and neck supple.  Cardiovascular:     Rate and Rhythm: Normal rate and regular rhythm.     Heart sounds: No murmur.  Pulmonary:     Effort: Pulmonary effort is normal. No respiratory distress.     Breath sounds: Normal breath sounds.  Abdominal:     Palpations: Abdomen is soft.     Tenderness: There is no abdominal tenderness.  Musculoskeletal:     Right lower leg: Edema present.     Left lower leg: Edema present.     Comments: Marked bilateral lower extremity edema.  Pitting.  Erythema bilaterally intense redness.  Suggestive of chronic edema.  Difficult to assess the right ankle due to all the swelling.  But no obvious deformity.  Skin:    General: Skin is warm and dry.     Capillary Refill: Capillary refill takes less than 2 seconds.  Neurological:     General: No focal deficit present.     Mental Status: She is alert. Mental status is at baseline.      ED Treatments / Results  Labs (all labs ordered are listed, but only abnormal results are displayed) Labs Reviewed  COMPREHENSIVE METABOLIC PANEL - Abnormal; Notable for the following components:      Result Value   Glucose, Bld 103 (*)    BUN 31  (*)    Creatinine, Ser 1.26 (*)    Total Protein 6.1 (*)    Albumin 2.5 (*)    GFR calc non Af Amer 39 (*)    GFR calc Af Amer 45 (*)    All other components within normal limits  CBC WITH DIFFERENTIAL/PLATELET - Abnormal; Notable for the following components:   WBC 11.7 (*)    MCV 102.5 (*)    MCHC 29.6 (*)    Monocytes Absolute 1.2 (*)    All other components within normal limits  LIPASE, BLOOD  URINALYSIS, ROUTINE W REFLEX MICROSCOPIC    EKG EKG Interpretation  Date/Time:  Tuesday October 18 2019 11:01:14 EST Ventricular Rate:  65 PR Interval:    QRS Duration: 73 QT Interval:  446 QTC Calculation: 464 R Axis:   39 Text Interpretation: Sinus rhythm Ventricular premature complex Short PR interval Low voltage, precordial leads Nonspecific T abnrm, anterolateral leads Confirmed by Vanetta Mulders [phone]) on 10/18/2019 12:26:58 PM   Radiology Dg Chest 2 View  Result Date: 10/18/2019 CLINICAL DATA:  Weakness. EXAM: CHEST - 2 VIEW COMPARISON:  08/01/2018 FINDINGS: The cardiomediastinal contours are unchanged, stable borderline cardiomegaly. Aortic atherosclerosis. Chronic elevation of right hemidiaphragm. Pulmonary vasculature is normal. No consolidation, pleural effusion, or pneumothorax. No acute osseous abnormalities are seen. IMPRESSION: No acute findings. Stable borderline cardiomegaly and aortic atherosclerosis. Aortic Atherosclerosis (ICD10-I70.0). Electronically Signed   By: Narda Rutherford M.D.   On: 10/18/2019 13:42   Ct Head Wo Contrast  Result Date: 10/18/2019 CLINICAL DATA:  Weakness EXAM: CT HEAD WITHOUT CONTRAST TECHNIQUE: Contiguous axial images were obtained from the base of the skull through the vertex without intravenous contrast. COMPARISON:  None. FINDINGS: Brain: There is no acute intracranial hemorrhage, mass-effect, or edema. Gray-white differentiation is preserved. There is no extra-axial fluid collection. Ventricles and sulci are within normal limits in  size and configuration. Confluent areas of hypoattenuation in the supratentorial white matter are nonspecific but probably reflect moderate to advanced chronic microvascular ischemic changes. Age-indeterminate small vessel infarct of the right lentiform nucleus. Vascular: There is  atherosclerotic calcification at the skull base. Skull: Calvarium is unremarkable. Sinuses/Orbits: No acute finding. Other: None. IMPRESSION: No acute intracranial hemorrhage, mass effect, or evidence of acute infarction. Moderate to advanced chronic microvascular ischemic changes. Age-indeterminate small vessel infarct of the right basal ganglia. Electronically Signed   By: Macy Mis M.D.   On: 10/18/2019 13:30    Procedures Procedures (including critical care time)  Medications Ordered in ED Medications - No data to display   Initial Impression / Assessment and Plan / ED Course  I have reviewed the triage vital signs and the nursing notes.  Pertinent labs & imaging results that were available during my care of the patient were reviewed by me and considered in my medical decision making (see chart for details).        Patient with a screening work-up to include labs.  Urinalysis still pending CT head chest x-ray no significant findings.  No significant anemia.  Basic labs and electrolytes without significant abnormalities renal insufficiency but at baseline.  Patient without evidence of any significant lower GI bleed does have a history of diverticulosis of bleeding could be coming from that.  Will follow up with primary care doctor for this.  Cough most likely related to COPD no evidence of pneumonia on chest x-ray.  And symptoms not consistent with COVID-19 infection cough has been ongoing for weeks.  Feel that patient does not need to continue the doxycycline antibiotic.  Close follow-up with primary care doctor for the lower extremity edema and for the overall constellation of symptoms.  May be could  consider MRI.  Urinalysis will be followed up by evening emergency physician Dr. Eulis Foster.  If shows evidence of urinary tract infection culture will be sent and antibiotics will be initiated for that.  Otherwise patient stable for discharge home.  Final Clinical Impressions(s) / ED Diagnoses   Final diagnoses:  Generalized weakness  Fatigue, unspecified type  History of GI bleed    ED Discharge Orders    None       Fredia Sorrow, MD 10/18/19 [phone]

## 2019-10-18 NOTE — ED Provider Notes (Signed)
3:55 PM-checkout from Dr. Rogene Houston to evaluate urinalysis and discharge, if appropriate.  Patient presented for evaluation of cough.  This has been treated with doxycycline.  Urinalysis was ordered, it does not indicate infection.  At this time the patient is alert and conversant.  She has a mild dry cough.  She denies dysuria, urinary frequency, hematuria.  She does not have suprapubic abdominal pain.  She has not been eating much while on the doxycycline for bronchitis.  No other complaints at this time.  I discussed the findings with the patient and her daughter, all questions were answered.   Daleen Bo, MD 10/18/19 [phone]

## 2019-10-18 NOTE — Discharge Instructions (Addendum)
Recommend follow-up with primary care doctor for further evaluation.  Today's work-up to include CT of head chest x-ray without acute findings.  Labs without significant abnormality.  Also would recommend following with primary care doctor about the intermittent blood that is seen in the stool.  No evidence of any significant anemia today.  Return for bloody bowel movement staining the commode water all red 3 times in a day.  Make sure that you are getting plenty of rest and drinking a lot of fluids.  Try to eat 3 meals each day.  Make sure you are keeping something on your stomach to help the nausea.  You can use the Phenergan prescribed by your doctor for nausea.  For cough you can use Robitussin-DM.  Continue taking the doxycycline until it is gone.

## 2019-10-24 ENCOUNTER — Telehealth: Payer: Self-pay | Admitting: Physician Assistant

## 2019-10-24 ENCOUNTER — Other Ambulatory Visit: Payer: Self-pay | Admitting: Physician Assistant

## 2019-10-24 DIAGNOSIS — R112 Nausea with vomiting, unspecified: Secondary | ICD-10-CM

## 2019-10-24 DIAGNOSIS — R1084 Generalized abdominal pain: Secondary | ICD-10-CM

## 2019-10-24 DIAGNOSIS — R110 Nausea: Secondary | ICD-10-CM

## 2019-10-24 DIAGNOSIS — R531 Weakness: Secondary | ICD-10-CM

## 2019-10-24 NOTE — Telephone Encounter (Signed)
Per referrals can you place this order and she will schedule

## 2019-10-24 NOTE — Telephone Encounter (Signed)
Patients daughter notified and verbalized understanding 

## 2019-10-24 NOTE — Telephone Encounter (Signed)
Order has ben placed.

## 2019-10-24 NOTE — Telephone Encounter (Signed)
Pt was seen at [facility] last week, pt is still weak, coughing,not eating, rectal bleeding yesterday. Pt's daughter is concerned and doesn't know what to do. Daughter said the doctor at the ED mentioned it could be gallbladder and daughter would like this looked into. Does pt need to be seen in office or televisit? Can we just do a referral? Please advise.

## 2019-10-24 NOTE — Telephone Encounter (Signed)
Schedule US abdomen for nausea, vomiting, weakness

## 2019-10-26 ENCOUNTER — Other Ambulatory Visit: Payer: Self-pay

## 2019-10-26 ENCOUNTER — Emergency Department (HOSPITAL_COMMUNITY)
Admission: EM | Admit: 2019-10-26 | Discharge: 2019-10-26 | Disposition: A | Discharge status: Home / Self Care | Payer: Medicare Other | Attending: Emergency Medicine | Admitting: Emergency Medicine

## 2019-10-26 ENCOUNTER — Encounter (HOSPITAL_COMMUNITY): Payer: Self-pay | Admitting: *Deleted

## 2019-10-26 ENCOUNTER — Telehealth: Payer: Self-pay | Admitting: Physician Assistant

## 2019-10-26 ENCOUNTER — Emergency Department (HOSPITAL_COMMUNITY): Payer: Medicare Other

## 2019-10-26 DIAGNOSIS — J449 Chronic obstructive pulmonary disease, unspecified: Secondary | ICD-10-CM | POA: Insufficient documentation

## 2019-10-26 DIAGNOSIS — Z79899 Other long term (current) drug therapy: Secondary | ICD-10-CM | POA: Insufficient documentation

## 2019-10-26 DIAGNOSIS — R112 Nausea with vomiting, unspecified: Secondary | ICD-10-CM | POA: Insufficient documentation

## 2019-10-26 DIAGNOSIS — Z7901 Long term (current) use of anticoagulants: Secondary | ICD-10-CM | POA: Diagnosis not present

## 2019-10-26 DIAGNOSIS — I10 Essential (primary) hypertension: Secondary | ICD-10-CM | POA: Diagnosis not present

## 2019-10-26 DIAGNOSIS — R110 Nausea: Secondary | ICD-10-CM

## 2019-10-26 DIAGNOSIS — R634 Abnormal weight loss: Secondary | ICD-10-CM | POA: Insufficient documentation

## 2019-10-26 DIAGNOSIS — R630 Anorexia: Secondary | ICD-10-CM | POA: Diagnosis not present

## 2019-10-26 LAB — CBC WITH DIFFERENTIAL/PLATELET
Abs Immature Granulocytes: 0.03 10*3/uL (ref 0.00–0.07)
Basophils Absolute: 0.1 10*3/uL (ref 0.0–0.1)
Basophils Relative: 1 %
Eosinophils Absolute: 0.3 10*3/uL (ref 0.0–0.5)
Eosinophils Relative: 3 %
HCT: 43.6 % (ref 36.0–46.0)
Hemoglobin: 12.6 g/dL (ref 12.0–15.0)
Immature Granulocytes: 0 %
Lymphocytes Relative: 29 %
Lymphs Abs: 2.8 10*3/uL (ref 0.7–4.0)
MCH: 30.4 pg (ref 26.0–34.0)
MCHC: 28.9 g/dL — ABNORMAL LOW (ref 30.0–36.0)
MCV: 105.1 fL — ABNORMAL HIGH (ref 80.0–100.0)
Monocytes Absolute: 1.1 10*3/uL — ABNORMAL HIGH (ref 0.1–1.0)
Monocytes Relative: 11 %
Neutro Abs: 5.5 10*3/uL (ref 1.7–7.7)
Neutrophils Relative %: 56 %
Platelets: 166 10*3/uL (ref 150–400)
RBC: 4.15 MIL/uL (ref 3.87–5.11)
RDW: 12.4 % (ref 11.5–15.5)
WBC: 9.8 10*3/uL (ref 4.0–10.5)
nRBC: 0 % (ref 0.0–0.2)

## 2019-10-26 LAB — URINALYSIS, ROUTINE W REFLEX MICROSCOPIC
Bilirubin Urine: NEGATIVE
Glucose, UA: NEGATIVE mg/dL
Ketones, ur: NEGATIVE mg/dL
Nitrite: NEGATIVE
Protein, ur: NEGATIVE mg/dL
Specific Gravity, Urine: 1.029 (ref 1.005–1.030)
pH: 5 (ref 5.0–8.0)

## 2019-10-26 LAB — COMPREHENSIVE METABOLIC PANEL
ALT: 16 U/L (ref 0–44)
AST: 19 U/L (ref 15–41)
Albumin: 2.5 g/dL — ABNORMAL LOW (ref 3.5–5.0)
Alkaline Phosphatase: 97 U/L (ref 38–126)
Anion gap: 12 (ref 5–15)
BUN: 18 mg/dL (ref 8–23)
CO2: 28 mmol/L (ref 22–32)
Calcium: 8.6 mg/dL — ABNORMAL LOW (ref 8.9–10.3)
Chloride: 100 mmol/L (ref 98–111)
Creatinine, Ser: 1.19 mg/dL — ABNORMAL HIGH (ref 0.44–1.00)
GFR calc Af Amer: 49 mL/min — ABNORMAL LOW (ref >60)
GFR calc non Af Amer: 42 mL/min — ABNORMAL LOW (ref >60)
Glucose, Bld: 111 mg/dL — ABNORMAL HIGH (ref 70–99)
Potassium: 4.3 mmol/L (ref 3.5–5.1)
Sodium: 140 mmol/L (ref 135–145)
Total Bilirubin: 1.5 mg/dL — ABNORMAL HIGH (ref 0.3–1.2)
Total Protein: 6.4 g/dL — ABNORMAL LOW (ref 6.5–8.1)

## 2019-10-26 LAB — MAGNESIUM: Magnesium: 1.7 mg/dL (ref 1.7–2.4)

## 2019-10-26 LAB — LIPASE, BLOOD: Lipase: 46 U/L (ref 11–51)

## 2019-10-26 MED ORDER — SODIUM CHLORIDE 0.9 % IV BOLUS
500.0000 mL | Freq: Once | INTRAVENOUS | Status: AC
  Administered 2019-10-26: 18:00:00 500 mL via INTRAVENOUS

## 2019-10-26 MED ORDER — IOHEXOL 300 MG/ML  SOLN
75.0000 mL | Freq: Once | INTRAMUSCULAR | Status: AC | PRN
  Administered 2019-10-26: 75 mL via INTRAVENOUS

## 2019-10-26 NOTE — ED Triage Notes (Signed)
Pt c/o nausea and decreased appetite x weeks. Pt was treated with bronchitis recently but hasn't gotten any better. Pt's PCP is out of the office due to Covid so she cannot follow up with them.

## 2019-10-26 NOTE — Discharge Instructions (Addendum)
You have been diagnosed today with Nausea.  At this time there does not appear to be the presence of an emergent medical condition, however there is always the potential for conditions to change. Please read and follow the below instructions.  Please return to the Emergency Department immediately for any new or worsening symptoms. Please be sure to follow up with your Primary Care Provider within one week regarding your visit today; please call their office to schedule an appointment even if you are feeling better for a follow-up visit.  Please use your MyChart account to find the results of your test today and discussed them in their entirety with your primary care provider at your follow-up visit this week. Your CT scan today showed diverticulosis, small sliding-type hiatal hernia, atherosclerosis and degenerative changes of your lumbar spine.  Discussed all of these incidental findings with your primary care provider at your follow-up visit this week. Call the gastroenterologist Dr. Gala Romney on your discharge paperwork tomorrow morning for further evaluation of your nausea. You may call the general surgeon Dr. Arnoldo Morale on your discharge paperwork to discuss your hiatal hernia seen on CT scan. Your urine today showed some bacteria but appear to be contaminated, and has been sent for culture.  If it grows out bacteria that need antibiotic treatment you will be contacted by [facility] with a prescription.  If you develop any signs of a urinary tract infection return to the ER. An order has been placed for an ultrasound of your right upper quadrant of your abdomen for tomorrow.  Return to [facility] tomorrow morning to have this ultrasound performed.  Discuss your ultrasound results with your primary care provider at your follow-up visit this week.  Get help right away if: You have pain in your chest, neck, arm, or jaw. You feel very weak or you pass out (faint). You have throw up that is bright  red or looks like coffee grounds. You have bloody or black poop (stools) or poop that looks like tar. You have a very bad headache, a stiff neck, or both. You have very bad pain, cramping, or bloating in your belly (abdomen). You have trouble breathing or you are breathing very quickly. Your heart is beating very quickly. Your skin feels cold and clammy. You feel confused. You have signs of losing too much water in your body, such as: Dark pee, very little pee, or no pee. Cracked lips. Dry mouth. Sunken eyes. Sleepiness. Weakness. You have any new/concerning or worsening of symptoms  Please read the additional information packets attached to your discharge summary.  Do not take your medicine if  develop an itchy rash, swelling in your mouth or lips, or difficulty breathing; call 911 and seek immediate emergency medical attention if this occurs.  Note: Portions of this text may have been transcribed using voice recognition software. Every effort was made to ensure accuracy; however, inadvertent computerized transcription errors may still be present.

## 2019-10-26 NOTE — ED Provider Notes (Signed)
[facility] Provider Note   CSN: [id number] Arrival date & time: 10/26/19  1209     History   Chief Complaint Chief Complaint  Patient presents with   Nausea    HPI Courtney Berry is a 85 y.o. female history COPD, diverticulosis, hypertension, lymphedema, hyperlipidemia, paroxysmal atrial fibrillation, rheumatoid arthritis, hypertension, on Eliquis.  Patient presents today for 7 weeks of nausea and decreased p.o. intake.  Patient reports that she has been dealing with COPD exacerbation for several weeks, she is finished a course of doxycycline this week.  She reports that her cough and respiratory symptoms have greatly improved and are not causing her any concern at this time she reports that her only concern now is nausea and decreased p.o. intake.  Patient reports that she can only eat small amounts of food without becoming nauseous and dry heaving, she denies any actual vomiting.  She reports symptoms have continued to worsen over the past 7 weeks and have no alleviating factors, she is unable to follow-up with her PCP at this time due to COVID-19 pandemic.  Denies fever/chills, headache, neck pain, chest pain/shortness of breath, change to cough, hemoptysis, abdominal pain, vomiting, hematemesis, diarrhea, constipation, dysuria/hematuria, fall/injury or any additional concerns.     HPI  Past Medical History:  Diagnosis Date   COPD (chronic obstructive pulmonary disease) (HCC)    Diverticulosis    Essential hypertension, benign    History of chest pain    Low risk Cardiolite 2010   Lymphedema    Mixed hyperlipidemia    PAF (paroxysmal atrial fibrillation) (HCC)    Rheumatoid arthritis (HCC)     Patient Active Problem List   Diagnosis Date Noted   Neuropathy 03/02/2019   PAF (paroxysmal atrial fibrillation) (HCC) 08/01/2018   COPD (chronic obstructive pulmonary disease) (HCC) 08/01/2018   Lymphedema 08/31/2017   Lymphedema of both  lower extremities 02/09/2017   Hypotension due to drugs 11/24/2016   Pain in both lower legs 11/24/2016   Rheumatoid arthritis (HCC) 10/20/2016   Peripheral vascular disease (HCC) 10/20/2016   Atrial fibrillation with RVR (HCC) 08/01/2016   Bradycardia 08/01/2016   Chronic anticoagulation 02/14/2016   Palpitations 03/02/2012   Dyslipidemia 09/03/2009   Essential hypertension 09/03/2009   Shortness of breath 09/03/2009    Past Surgical History:  Procedure Laterality Date   TOTAL ABDOMINAL HYSTERECTOMY  1969     OB History   No obstetric history on file.      Home Medications    Prior to Admission medications   Medication Sig Start Date End Date Taking? Authorizing Provider  acetaminophen (TYLENOL) 500 MG tablet Take 1,000 mg by mouth every 6 (six) hours as needed for moderate pain.   Yes [provider]  benzonatate (TESSALON) 200 MG capsule TAKE 1 CAPSULE TWICE DAILY AS NEEDED FOR COUGH 06/07/19  Yes Remus LofflerJones, Angel S, PA-C  ELIQUIS 5 MG TABS tablet TAKE ONE TABLET BY MOUTH TWICE DAILY 04/08/19  Yes Jonelle SidleMcDowell, Samuel G, MD  fluticasone [facility](FLONASE) 50 MCG/ACT nasal spray Place 2 sprays into both nostrils daily. 04/25/19  Yes Rakes, Doralee AlbinoLinda M, FNP  hydrochlorothiazide (HYDRODIURIL) 25 MG tablet Take 1 tablet (25 mg total) by mouth daily. 12/29/18  Yes Remus LofflerJones, Angel S, PA-C  lisinopril (ZESTRIL) 20 MG tablet TAKE ONE (1) TABLET EACH DAY 09/12/19  Yes Jonelle SidleMcDowell, Samuel G, MD  metoprolol succinate (TOPROL-XL) 25 MG 24 hr tablet TAKE 1 AND 1/2 TABLET TWICE A DAY 09/12/19  Yes Jonelle SidleMcDowell, Samuel G, MD  predniSONE (DELTASONE) 10 MG tablet TAKE 1 TABLET EVERY MORNING WITH BREAKFAST 06/07/19  Yes Terald Sleeper, PA-C  PROAIR HFA [phone] Base) MCG/ACT inhaler Inhale 1-2 puffs into the lungs every 6 (six) hours as needed for wheezing or shortness of breath. 09/22/19  Yes Terald Sleeper, PA-C  promethazine (PHENERGAN) 12.5 MG tablet Take 1 tablet (12.5 mg total) by mouth every 8 (eight)  hours as needed for nausea or vomiting. 10/11/19  Yes Terald Sleeper, PA-C  umeclidinium-vilanterol (ANORO ELLIPTA) 62.5-25 MCG/INH AEPB Inhale 1 puff into the lungs daily.   Yes [provider]  ALPRAZolam (XANAX) 0.25 MG tablet Take 1 tablet (0.25 mg total) by mouth 2 (two) times daily as needed for anxiety. Patient not taking: Reported on 10/26/2019 08/22/19   Terald Sleeper, PA-C  doxycycline (VIBRA-TABS) 100 MG tablet Take 1 tablet (100 mg total) by mouth 2 (two) times daily. 1 po bid Patient not taking: Reported on 10/26/2019 10/11/19   Terald Sleeper, PA-C  HYDROcodone-homatropine [facility]) 5-1.5 MG/5ML syrup Take 5 mLs by mouth every 6 (six) hours as needed for cough. Patient not taking: Reported on 10/26/2019 09/22/19   Terald Sleeper, PA-C    Family History Family History  Problem Relation Age of Onset   Cancer Father    Cancer Mother    Heart disease Sister        CABG age 74   Cancer Brother     Social History Social History   Tobacco Use   Smoking status: Never Smoker   Smokeless tobacco: Never Used   Tobacco comment: Tobacco use-no  Substance Use Topics   Alcohol use: No    Alcohol/week: 0.0 standard drinks   Drug use: No     Allergies   Darvocet [propoxyphene n-acetaminophen], Nitrofurantoin monohyd macro, Omnicef [cefdinir], Plavix [clopidogrel bisulfate], Sulfa antibiotics, and Penicillins   Review of Systems Review of Systems Ten systems are reviewed and are negative for acute change except as noted in the HPI   Physical Exam Updated Vital Signs BP (!) 147/48 (BP Location: Right Arm)    Pulse 74    Temp 98.7 F (37.1 C) (Oral)    Resp 15    Ht 5\' 5"  (1.651 m)    Wt 104.3 kg    SpO2 94%    BMI 38.27 kg/m   Physical Exam Constitutional:      General: She is not in acute distress.    Appearance: Normal appearance. She is well-developed. She is obese. She is not ill-appearing or diaphoretic.  HENT:     Head: Normocephalic and atraumatic.      Right Ear: External ear normal.     Left Ear: External ear normal.     Nose: Nose normal.  Eyes:     General: Vision grossly intact. Gaze aligned appropriately.     Pupils: Pupils are equal, round, and reactive to light.  Neck:     Musculoskeletal: Normal range of motion.     Trachea: Trachea and phonation normal. No tracheal deviation.  Pulmonary:     Effort: Pulmonary effort is normal. No respiratory distress.  Abdominal:     General: There is no distension.     Palpations: Abdomen is soft.     Tenderness: There is no abdominal tenderness. There is no guarding or rebound.  Musculoskeletal: Normal range of motion.     Right lower leg: Edema present.     Left lower leg: Edema present.     Comments:  Bilateral lower extremity lymphedema patient reports as chronic and baseline  Skin:    General: Skin is warm and dry.  Neurological:     Mental Status: She is alert.     GCS: GCS eye subscore is 4. GCS verbal subscore is 5. GCS motor subscore is 6.     Comments: Speech is clear and goal oriented, follows commands Major Cranial nerves without deficit, no facial droop Moves extremities without ataxia, coordination intact  Psychiatric:        Behavior: Behavior normal.    ED Treatments / Results  Labs (all labs ordered are listed, but only abnormal results are displayed) Labs Reviewed  CBC WITH DIFFERENTIAL/PLATELET - Abnormal; Notable for the following components:      Result Value   MCV 105.1 (*)    MCHC 28.9 (*)    Monocytes Absolute 1.1 (*)    All other components within normal limits  COMPREHENSIVE METABOLIC PANEL - Abnormal; Notable for the following components:   Glucose, Bld 111 (*)    Creatinine, Ser 1.19 (*)    Calcium 8.6 (*)    Total Protein 6.4 (*)    Albumin 2.5 (*)    Total Bilirubin 1.5 (*)    GFR calc non Af Amer 42 (*)    GFR calc Af Amer 49 (*)    All other components within normal limits  URINALYSIS, ROUTINE W REFLEX MICROSCOPIC - Abnormal; Notable  for the following components:   Hgb urine dipstick SMALL (*)    Leukocytes,Ua SMALL (*)    Bacteria, UA RARE (*)    All other components within normal limits  URINE CULTURE  LIPASE, BLOOD  MAGNESIUM    EKG EKG Interpretation  Date/Time:  Wednesday October 26 2019 13:50:31 EST Ventricular Rate:  72 PR Interval:    QRS Duration: 91 QT Interval:  427 QTC Calculation: 468 R Axis:   43 Text Interpretation: Sinus rhythm Short PR interval Low voltage, extremity and precordial leads Confirmed by Blane Ohara [phone]) on 10/26/2019 2:49:16 PM   Radiology Ct Abdomen Pelvis W Contrast  Result Date: 10/26/2019 CLINICAL DATA:  Nausea and vomiting with weight loss EXAM: CT ABDOMEN AND PELVIS WITH CONTRAST TECHNIQUE: Multidetector CT imaging of the abdomen and pelvis was performed using the standard protocol following bolus administration of intravenous contrast. CONTRAST:  23mL OMNIPAQUE IOHEXOL 300 MG/ML  SOLN COMPARISON:  12/01/2016 FINDINGS: Lower chest: Lung bases demonstrate mild scarring stable from the prior exam. Coronary calcifications are seen. Hepatobiliary: No focal liver abnormality is seen. No gallstones, gallbladder wall thickening, or biliary dilatation. Pancreas: Unremarkable. No pancreatic ductal dilatation or surrounding inflammatory changes. Spleen: Normal in size without focal abnormality. Adrenals/Urinary Tract: Adrenal glands are within normal limits. Kidneys are well visualized bilaterally without renal calculi or obstructive changes. The ureters are within normal limits. The bladder is partially distended. Stomach/Bowel: Mild diverticular change of colon is noted. No diverticulitis is seen. No obstructive or inflammatory change of the colon is noted. The small bowel and stomach are within normal limits with the exception of a small sliding-type hiatal hernia. Vascular/Lymphatic: Aortic atherosclerosis. No enlarged abdominal or pelvic lymph nodes. Reproductive: Status post  hysterectomy. No adnexal masses. Other: No abdominal wall hernia or abnormality. No abdominopelvic ascites. Musculoskeletal: Degenerative changes of the lumbar spine are seen. No acute bony abnormality is noted. IMPRESSION: Diverticulosis without diverticulitis. Small sliding-type hiatal hernia. No other focal abnormality is noted. Aortic Atherosclerosis (ICD10-I70.0). Electronically Signed   By: Alcide Clever M.D.   On:  10/26/2019 16:21    Procedures Procedures (including critical care time)  Medications Ordered in ED Medications  iohexol (OMNIPAQUE) 300 MG/ML solution 75 mL (75 mLs Intravenous Contrast Given 10/26/19 1602)  sodium chloride 0.9 % bolus 500 mL (0 mLs Intravenous Stopped 10/26/19 1934)     Initial Impression / Assessment and Plan / ED Course  I have reviewed the triage vital signs and the nursing notes.  Pertinent labs & imaging results that were available during my care of the patient were reviewed by me and considered in my medical decision making (see chart for details).     Patient arrives well-appearing in no acute distress with nausea for the past several weeks decreased p.o. intake.  Denies any abdominal pain.  Has recently completed doxycycline for bronchitis.  No infectious-like symptoms and benign abdomen on exam no urinary symptoms.  Will obtain CBC, CMP, lipase, urinalysis, magnesium and CT abdomen pelvis at this time.  Patient seen and evaluated by Dr. Jodi MourningZavitz during this visit who agrees with plan. = CBC nonacute Lipase within normal limits CMP creatinine 1.19 appears baseline, bilirubin 1.5 slightly elevated from 1.28 days ago, no acute findings Magnesium within normal limits Urinalysis shows rare bacteria, 11-20 WBCs, 6-10 squamous cells, small hemoglobin.  Patient without urinary symptoms we will send urine culture.  EKG: Sinus rhythm Short PR interval Low voltage, extremity and precordial leads Confirmed by Blane OharaZavitz, Joshua [phone]) on 10/26/2019 2:49:16 PM  CT  AP:  IMPRESSION:  Diverticulosis without diverticulitis.    Small sliding-type hiatal hernia.    No other focal abnormality is noted.    Aortic Atherosclerosis (ICD10-I70.0).  - Will obtain RUQ ultrasound for evaluation of increasing bilirubin.  Give fluid bolus, p.o. challenge anticipate discharge. - I have been informed that ultrasound is not available at this facility at this time of night.  I discussed case with Dr. Deretha EmoryZackowski who is attending after shift change advises that ultrasound can be ordered in disposition and patient will return tomorrow for ultrasound of her right upper quadrant; chart reviewed, agrees with discharge and outpatient follow-up at this time. - Patient reevaluated resting comfortably no acute distress she has been drinking a soda and eating graham crackers without difficulty, she is requesting to be discharged.  I have updated patient and her daughter on work-up as above and they state understanding, will give gastroenterology referral for ongoing nausea and general surgery referral for her hiatal hernia.  Patient daughter both agreeable to returning tomorrow for ultrasound of the right upper quadrant and they plan to call the primary care doctor's office tomorrow morning to schedule a follow-up appointment.  I discussed incidentals as above with patient and her daughter, I encouraged them to use MyChart account to discuss all results with PCP at follow-up visit this time.  At this time there does not appear to be any evidence of an acute emergency medical condition and the patient appears stable for discharge with appropriate outpatient follow up. Diagnosis was discussed with patient who verbalizes understanding of care plan and is agreeable to discharge. I have discussed return precautions with patient and daughter who verbalizes understanding of return precautions. Patient encouraged to follow-up with their PCP and GI. All questions answered.  Patient has been  discharged in good condition.  Note: Portions of this report may have been transcribed using voice recognition software. Every effort was made to ensure accuracy; however, inadvertent computerized transcription errors may still be present. Final Clinical Impressions(s) / ED Diagnoses   Final diagnoses:  Nausea    ED Discharge Orders         Ordered    US Abdomen Limited RUQ/Gall Gladder     10/26/19 1945           Elizabeth Palau 10/26/19 1950    Blane Ohara, MD 10/30/19 [phone]

## 2019-10-27 ENCOUNTER — Telehealth: Payer: Self-pay | Admitting: Physician Assistant

## 2019-10-27 NOTE — Telephone Encounter (Signed)
Patient aware and verbalized understanding. Appt made 

## 2019-10-27 NOTE — Telephone Encounter (Signed)
Pt's daughter says that she took pt to [facility] yesterday and a CT scan was done on abdomen and bladder, along with uranalysis and blood work. Says Angel scheduled her to have an ultrasound done on her bladder at [facility] but wants to know if they should cancel that since she had work done there yesterday and results came back good. Daughter also wants to know if she can be prescribed something because pt has thrush in her mouth. Wants call back.

## 2019-10-27 NOTE — Telephone Encounter (Signed)
No need to have u/s done.  CT abd/pelvis showed no abnormalities within gallbladder.  Place on after hours schedule or w/ PCP to discuss any new prescriptions needed.

## 2019-10-28 ENCOUNTER — Ambulatory Visit (INDEPENDENT_AMBULATORY_CARE_PROVIDER_SITE_OTHER): Payer: Medicare Other | Admitting: Family Medicine

## 2019-10-28 ENCOUNTER — Encounter: Payer: Self-pay | Admitting: Family Medicine

## 2019-10-28 DIAGNOSIS — B370 Candidal stomatitis: Secondary | ICD-10-CM

## 2019-10-28 LAB — URINE CULTURE: Culture: 50000 — AB

## 2019-10-28 MED ORDER — MAGIC MOUTHWASH W/LIDOCAINE: 5.0000 mL | Freq: Three times a day (TID) | ORAL | 0 refills | Status: DC | PRN

## 2019-10-28 MED ORDER — CLOTRIMAZOLE 10 MG MT TROC: 10.0000 mg | Freq: Every day | OROMUCOSAL | 0 refills | Status: AC

## 2019-10-28 NOTE — Progress Notes (Signed)
Virtual Visit via telephone Note Due to COVID-19 pandemic this visit was conducted virtually. This visit type was conducted due to national recommendations for restrictions regarding the COVID-19 Pandemic (e.g. social distancing, sheltering in place) in an effort to limit this patient's exposure and mitigate transmission in our community. All issues noted in this document were discussed and addressed.  A physical exam was not performed with this format.   I connected with Courtney Berry on 10/28/2019 at 0750 by telephone and verified that I am speaking with the correct person using two identifiers. Courtney Berry is currently located at home and family is currently with them during visit. The provider, Monia Pouch, FNP is located in their office at time of visit.  I discussed the limitations, risks, security and privacy concerns of performing an evaluation and management service by telephone and the availability of in person appointments. I also discussed with the patient that there may be a patient responsible charge related to this service. The patient expressed understanding and agreed to proceed.  Subjective:  Patient ID: Courtney Berry, female    DOB: 10-09-1933, 85 y.o.   MRN: [id number]  Chief Complaint:  Thrush   HPI: Courtney Berry is a 85 y.o. female presenting on 10/28/2019 for [place]   Berry reports pt has thrush. States she has been on several rounds of antibiotics recently. States she has a thick white to yellow colored coating on her tongue and is complaining of oral pain due to the thrush. She denies bleeding or open lesions. Would like something to treat the thrush.     Relevant past medical, surgical, family, and social history reviewed and updated as indicated.  Allergies and medications reviewed and updated.   Past Medical History:  Diagnosis Date  . COPD (chronic obstructive pulmonary disease) ([place])   . Diverticulosis   . Essential  hypertension, benign   . History of chest pain    Low risk Cardiolite 2010  . Lymphedema   . Mixed hyperlipidemia   . PAF (paroxysmal atrial fibrillation) ([place])   . Rheumatoid arthritis [facility])     Past Surgical History:  Procedure Laterality Date  . TOTAL ABDOMINAL HYSTERECTOMY  1969    Social History   Socioeconomic History  . Marital status: Married    Spouse name: Not on file  . Number of children: Not on file  . Years of education: Not on file  . Highest education level: Not on file  Occupational History  . Occupation: Retired  Tobacco Use  . Smoking status: Never Smoker  . Smokeless tobacco: Never Used  . Tobacco comment: Tobacco use-no  Substance and Sexual Activity  . Alcohol use: No    Alcohol/week: 0.0 standard drinks  . Drug use: No  . Sexual activity: Not on file  Other Topics Concern  . Not on file  Social History Narrative  . Not on file   Social Determinants of Health   Financial Resource Strain:   . Difficulty of Paying Living Expenses: Not on file  Food Insecurity:   . Worried About Charity fundraiser in the Last Year: Not on file  . Ran Out of Food in the Last Year: Not on file  Transportation Needs:   . Lack of Transportation (Medical): Not on file  . Lack of Transportation (Non-Medical): Not on file  Physical Activity:   . Days of Exercise per Week: Not on file  . Minutes of Exercise per Session: Not on file  Stress:   . Feeling of Stress : Not on file  Social Connections:   . Frequency of Communication with Friends and Family: Not on file  . Frequency of Social Gatherings with Friends and Family: Not on file  . Attends Religious Services: Not on file  . Active Member of Clubs or Organizations: Not on file  . Attends Banker Meetings: Not on file  . Marital Status: Not on file  Intimate Partner Violence:   . Fear of Current or Ex-Partner: Not on file  . Emotionally Abused: Not on file  . Physically Abused: Not on file  .  Sexually Abused: Not on file    Outpatient Encounter Medications as of 10/28/2019  Medication Sig  . acetaminophen (TYLENOL) 500 MG tablet Take 1,000 mg by mouth every 6 (six) hours as needed for moderate pain.  Marland Kitchen ALPRAZolam (XANAX) 0.25 MG tablet Take 1 tablet (0.25 mg total) by mouth 2 (two) times daily as needed for anxiety. (Patient not taking: Reported on 10/26/2019)  . benzonatate (TESSALON) 200 MG capsule TAKE 1 CAPSULE TWICE DAILY AS NEEDED FOR COUGH  . clotrimazole (MYCELEX) 10 MG troche Take 1 tablet (10 mg total) by mouth 5 (five) times daily for 7 days.  Marland Kitchen ELIQUIS 5 MG TABS tablet TAKE ONE TABLET BY MOUTH TWICE DAILY  . fluticasone (FLONASE) 50 MCG/ACT nasal spray Place 2 sprays into both nostrils daily.  . hydrochlorothiazide (HYDRODIURIL) 25 MG tablet Take 1 tablet (25 mg total) by mouth daily.  Marland Kitchen HYDROcodone-homatropine (HYCODAN) 5-1.5 MG/5ML syrup Take 5 mLs by mouth every 6 (six) hours as needed for cough. (Patient not taking: Reported on 10/26/2019)  . lisinopril (ZESTRIL) 20 MG tablet TAKE ONE (1) TABLET EACH DAY  . magic mouthwash w/lidocaine SOLN Take 5 mLs by mouth 3 (three) times daily as needed for mouth pain.  . metoprolol succinate (TOPROL-XL) 25 MG 24 hr tablet TAKE 1 AND 1/2 TABLET TWICE A DAY  . predniSONE (DELTASONE) 10 MG tablet TAKE 1 TABLET EVERY MORNING WITH BREAKFAST  . PROAIR HFA 108 (90 Base) MCG/ACT inhaler Inhale 1-2 puffs into the lungs every 6 (six) hours as needed for wheezing or shortness of breath.  . promethazine (PHENERGAN) 12.5 MG tablet Take 1 tablet (12.5 mg total) by mouth every 8 (eight) hours as needed for nausea or vomiting.  . umeclidinium-vilanterol (ANORO ELLIPTA) 62.5-25 MCG/INH AEPB Inhale 1 puff into the lungs daily.  . [DISCONTINUED] doxycycline (VIBRA-TABS) 100 MG tablet Take 1 tablet (100 mg total) by mouth 2 (two) times daily. 1 po bid (Patient not taking: Reported on 10/26/2019)   No facility-administered encounter medications on file  as of 10/28/2019.    Allergies  Allergen Reactions  . Darvocet [Propoxyphene N-Acetaminophen] Other (See Comments)    unknown  . Nitrofurantoin Monohyd Macro Other (See Comments)    unknown  . Omnicef [Cefdinir]     Sick, rash  . Plavix [Clopidogrel Bisulfate] Swelling    Face swells  . Sulfa Antibiotics Other (See Comments)    "face got puffy"  . Penicillins Rash    Has patient had a PCN reaction causing immediate rash, facial/tongue/throat swelling, SOB or lightheadedness with hypotension: Yes Has patient had a PCN reaction causing severe rash involving mucus membranes or skin necrosis: No Has patient had a PCN reaction that required hospitalization: No Has patient had a PCN reaction occurring within the last 10 years: No If all of the above answers are "NO", then may proceed with Cephalosporin use.  Review of Systems  Constitutional: Positive for appetite change and fatigue. Negative for activity change, chills, diaphoresis, fever and unexpected weight change.  HENT:       White coated tongue, oral discomfort.  Respiratory: Negative for cough and shortness of breath.   Cardiovascular: Negative for chest pain.  Gastrointestinal: Positive for nausea.  Genitourinary: Negative for decreased urine volume.  Neurological: Positive for weakness.  All other systems reviewed and are negative.        Observations/Objective: No vital signs or physical exam, this was a telephone or virtual health encounter.  Pt alert and oriented, answers all questions appropriately, and able to speak in full sentences.    Assessment and Plan: Talbert ForestShirley was seen today for thrush.  Diagnoses and all orders for this visit:  Camelia Pheneshrush Thrush due to recent antibiotic use. Symptomatic care discussed in detail. Medications as prescribed. Report any new, worsening, or persistent symptoms.  -     clotrimazole (MYCELEX) 10 MG troche; Take 1 tablet (10 mg total) by mouth 5 (five) times daily for 7  days. -     magic mouthwash w/lidocaine SOLN; Take 5 mLs by mouth 3 (three) times daily as needed for mouth pain.   Total time spent with patient 25 minutes.  Greater than 50% of encounter spent in coordination of care/counseling.   Follow Up Instructions: Return if symptoms worsen or fail to improve.    I discussed the assessment and treatment plan with the patient. The patient was provided an opportunity to ask questions and all were answered. The patient agreed with the plan and demonstrated an understanding of the instructions.   The patient was advised to call back or seek an in-person evaluation if the symptoms worsen or if the condition fails to improve as anticipated.  The above assessment and management plan was discussed with the patient. The patient verbalized understanding of and has agreed to the management plan. Patient is aware to call the clinic if they develop any new symptoms or if symptoms persist or worsen. Patient is aware when to return to the clinic for a follow-up visit. Patient educated on when it is appropriate to go to the emergency department.    I provided 25 minutes of non-face-to-face time during this encounter. The call started at 0750. The call ended at 0815. The other time was used for coordination of care.    Kari BaarsMichelle Gratia Disla, FNP-C [facility] [address] [place], [place] [address] [phone] 10/28/2019

## 2019-10-29 ENCOUNTER — Telehealth: Payer: Self-pay | Admitting: Emergency Medicine

## 2019-10-29 NOTE — Telephone Encounter (Signed)
Post ED Visit - Positive Culture Follow-up  Culture report reviewed by antimicrobial stewardship pharmacist: [place] Team []  Elenor Quinones, Pharm.D. []  Heide Guile, Pharm.D., BCPS AQ-ID []  Parks Neptune, Pharm.D., BCPS []  Alycia Rossetti, Pharm.D., BCPS []  [place], Pharm.D., BCPS, AAHIVP []  Legrand Como, Pharm.D., BCPS, AAHIVP []  Salome Arnt, PharmD, BCPS []  Johnnette Gourd, PharmD, BCPS []  Hughes Better, PharmD, BCPS [x]  Duanne Limerick, PharmD []  Laqueta Linden, PharmD, BCPS []  Albertina Parr, PharmD  [place] Team []  Leodis Sias, PharmD []  Lindell Spar, PharmD []  Royetta Asal, PharmD []  Graylin Shiver, Rph []  Rema Fendt) Glennon Mac, PharmD []  Arlyn Dunning, PharmD []  Netta Cedars, PharmD []  Dia Sitter, PharmD []  Leone Haven, PharmD []  Gretta Arab, PharmD []  Theodis Shove, PharmD []  Peggyann Juba, PharmD []  Reuel Boom, PharmD   Positive urine culture  No further patient follow-up is required at this time.  Sandi Raveling Osias Resnick 10/29/2019, 4:28 PM

## 2019-11-01 ENCOUNTER — Ambulatory Visit (HOSPITAL_COMMUNITY): Admission: RE | Admit: 2019-11-01 | Payer: Medicare Other | Source: Ambulatory Visit

## 2019-11-02 ENCOUNTER — Other Ambulatory Visit: Payer: Self-pay | Admitting: *Deleted

## 2019-11-02 MED ORDER — APIXABAN 5 MG PO TABS: 5.0000 mg | ORAL_TABLET | Freq: Two times a day (BID) | ORAL | 0 refills | Status: DC

## 2019-11-07 ENCOUNTER — Telehealth: Payer: Self-pay | Admitting: Physician Assistant

## 2019-11-07 ENCOUNTER — Other Ambulatory Visit: Payer: Self-pay | Admitting: Physician Assistant

## 2019-11-07 DIAGNOSIS — R630 Anorexia: Secondary | ICD-10-CM

## 2019-11-07 DIAGNOSIS — R110 Nausea: Secondary | ICD-10-CM

## 2019-11-07 DIAGNOSIS — R531 Weakness: Secondary | ICD-10-CM

## 2019-11-07 NOTE — Telephone Encounter (Signed)
I will place an order for gastroenterology to be placed as soon as possible.  Has she lost weight?

## 2019-11-07 NOTE — Telephone Encounter (Signed)
Referrals have been placed, I had actually already placed 1 gastroenterology referral but did not specifically say [place].  So that there is a second 1 and there that has are GA as soon as possible

## 2019-11-07 NOTE — Progress Notes (Signed)
Gastro

## 2019-11-07 NOTE — Telephone Encounter (Signed)
Daughter states that patient see's Dr. Juliann Pulse in [place]. Worried it could be her gallbladder.  Daughter states that she thinks she has lost around 10-15lbs.

## 2019-11-07 NOTE — Telephone Encounter (Signed)
Daughter states that it is fine to do a referral.  Wants to go to [facility].

## 2019-11-07 NOTE — Telephone Encounter (Signed)
Dr. Ladona Horns is a surgeon and not a gastroenterologist.  Is that okay? if so we will make the referral.

## 2019-11-07 NOTE — Telephone Encounter (Signed)
Patient was seen at [facility] 12/9 for nausea.  Patient had lab work done and a CT scan and daughter states nothing was found.  Would like for [facility] to review her labs and CT scan.  Daughter states that patient is no better- she is drinking but still not eating.  States that her thrush has improved.  Patient denies pain or fever.  Please advise and send to pools.

## 2019-11-08 NOTE — Telephone Encounter (Signed)
Patient's Ref has been sent to [facility].

## 2019-11-08 NOTE — Telephone Encounter (Signed)
Daughter aware and verbalizes understanding - states she wants to see Dr. Allean Found

## 2019-11-12 ENCOUNTER — Telehealth: Payer: Self-pay | Admitting: Family Medicine

## 2019-11-12 NOTE — Telephone Encounter (Deleted)
10 days  Bed sore

## 2019-11-14 ENCOUNTER — Telehealth: Payer: Self-pay | Admitting: Physician Assistant

## 2019-11-14 NOTE — Telephone Encounter (Addendum)
Pt took 2 of the doxy and it caused her to throw up so she can't take it. Also her right leg is swollen and weeping "sticky" fluid. Should she wrap the leg? She would like a different antibiotic called in for the cellulitis. Please advise.

## 2019-11-14 NOTE — Telephone Encounter (Signed)
Patients daughter aware

## 2019-11-14 NOTE — Telephone Encounter (Signed)
Pt's daughter received missed call from [place]. Requested to be called back.

## 2019-11-14 NOTE — Telephone Encounter (Signed)
error 

## 2019-11-14 NOTE — Telephone Encounter (Signed)
What symptoms do you have? Hardly eating, spoke to Provider on call over the weekend has cellulitis, has been taking doxycycline and it is making her throw up so she stopped taking it , has place on leg leaking fluid  How long have you been sick? Since yesterday  Have you been seen for this problem? No  If your provider decides to give you a prescription, which pharmacy would you like for it to be sent to? Drug store  Patient informed that this information will be sent to the clinical staff for review and that they should receive a follow up call.

## 2019-11-14 NOTE — Telephone Encounter (Signed)
NTBS may need unna boots applied

## 2019-11-14 NOTE — Telephone Encounter (Signed)
There is nothing I can do from here. Does sh ehave compression hose she can put on.

## 2019-11-14 NOTE — Telephone Encounter (Signed)
Daughter states that patient is too weak to bring her in the office.  Would like to know what she can do at home for patient ?

## 2019-11-22 ENCOUNTER — Ambulatory Visit (INDEPENDENT_AMBULATORY_CARE_PROVIDER_SITE_OTHER): Payer: Medicare Other | Admitting: Nurse Practitioner

## 2019-11-22 ENCOUNTER — Encounter: Payer: Self-pay | Admitting: Nurse Practitioner

## 2019-11-22 ENCOUNTER — Other Ambulatory Visit: Payer: Self-pay

## 2019-11-22 DIAGNOSIS — R634 Abnormal weight loss: Secondary | ICD-10-CM | POA: Insufficient documentation

## 2019-11-22 DIAGNOSIS — R110 Nausea: Secondary | ICD-10-CM | POA: Diagnosis not present

## 2019-11-22 DIAGNOSIS — R112 Nausea with vomiting, unspecified: Secondary | ICD-10-CM | POA: Insufficient documentation

## 2019-11-22 DIAGNOSIS — R630 Anorexia: Secondary | ICD-10-CM | POA: Insufficient documentation

## 2019-11-22 DIAGNOSIS — R17 Unspecified jaundice: Secondary | ICD-10-CM | POA: Diagnosis not present

## 2019-11-22 MED ORDER — ONDANSETRON HCL 4 MG PO TABS: 4.0000 mg | ORAL_TABLET | Freq: Three times a day (TID) | ORAL | 2 refills | Status: DC

## 2019-11-22 NOTE — Assessment & Plan Note (Signed)
The patient describes nausea but no vomiting.  This appears to have started recently after a fall in October 2020.  She takes a few bites and has nausea and occasional dry heaves.  In general she has a poor appetite as well as per above.  At this point I will send in a prescription for Zofran 3 times daily about 30 minutes before eating.  Hopefully this will help with her nausea to allow her letter oral intake.  Follow-up in 6 to 8 weeks.  Call for any worsening or severe symptoms.

## 2019-11-22 NOTE — Progress Notes (Signed)
Primary Care Physician:  Remus Loffler, PA-C Primary Gastroenterologist:  Dr. Jena Gauss  Chief Complaint  Patient presents with  . Nausea    dry heaves  . Weakness  . appetite loss    HPI:   Courtney Berry is a 86 y.o. female who presents on referral from PCP for nausea, weakness, appetite loss. Reviewed information associated with the referral including [facility] ED visit 10/26/2019.  Noted history of COPD, diverticulosis, lymphedema, hyperlipidemia, proximal A. fib, rheumatoid arthritis, hypertension, anticoagulation with Eliquis.  The patient presented complaining of 7 weeks of nausea and decreased oral intake.  Has been dealing with a COPD exacerbation for several weeks and finished doxycycline that week.  Cough and respiratory symptoms greatly improved.  Can only eat small amounts of food without becoming nauseous and dry heaving, denies any actual vomiting.  No fever or chills, abdominal pain, diarrhea, constipation.  Her physical exam was essentially normal related to her concerns.  Labs completed found essentially normal CBC with no leukocytosis or anemia, CMP with stable baseline creatinine at 1.19.  Interestingly her albumin was low at 2.5 and 10 months prior was normal at 3.7.  Her total bilirubin was elevated at 1.5, other LFTs normal.  Magnesium was normal.  UA suggestive of UTI and culture found 50,000 colonies per mL of Enterococcus faecalis.  CT completed which found diverticulosis without diverticulitis, small sliding-type hiatal hernia, no other abnormality.  Recommended right upper quadrant ultrasound due to elevated bilirubin but ultrasound was not available.  Recommended ordering ultrasound at discharge, although this has not been completed yet.  Patient tolerated graham crackers and a drink without complaints.  Requesting discharge and she was subsequently discharged to the care of primary care with recommended GI referral and possible surgical referral for sliding hiatal  hernia.  The patient was seen by primary care on 10/28/2019 for thrush due to recent significant antibiotics.  She was given Magic mouthwash and clotrimazole 10 mg troche.  Recommended follow-up for any worsening symptoms.  There was a note where pharmacy reviewed patient positive urine culture and no further follow-up was required.  The patient's daughter called primary care 11/07/2019 indicated the patient was still having symptoms and requested GI consult.  This was ordered and the patient was referred to our office.  Today she is accompanied by her daughter Courtney Berry. Today she states she's doing ok overall. Has had nausea, dry heaves, loss of appetite, and weakness for the past 2 months. She is con erned about + urine culture. She is having urinary burning. She states she doesn't have an appetite. Her daughter states she ate at Thanksgiving but not much since. She fell about mid-October and her daughter states her symptoms seemed to start after the fall. Did not go to the ER, did have a sprained ankle. She didn't have the U/S because "I was told they did a CT which was better." When she tries to make herself eat, she'll have nausea and dry heaves; denies vomiting. Has had weakness as well and a 14 lb weight loss (unintentional) in the past 2 months. Denies GERD symptoms. Has a bowel movement every other day, consistent with [place] 4, no straining. No worse triggers. Has been tried on Phenergan which didn't help much. Denies hematochezia, melena, abdominal pain, fever, chills. Denies URI or flu-like symptoms. Denies loss of sense of taste or smell. Denies chest pain, dyspnea, dizziness, lightheadedness, syncope, near syncope. Denies any other upper or lower GI symptoms.  She does tolerate  liquids. Drinks a lot of water.  Past Medical History:  Diagnosis Date  . COPD (chronic obstructive pulmonary disease) (HCC)   . Diverticulosis   . Essential hypertension, benign   . History of chest pain     Low risk Cardiolite 2010  . Lymphedema   . Mixed hyperlipidemia   . PAF (paroxysmal atrial fibrillation) (HCC)   . Rheumatoid arthritis [facility])     Past Surgical History:  Procedure Laterality Date  . TOTAL ABDOMINAL HYSTERECTOMY  1969    Current Outpatient Medications  Medication Sig Dispense Refill  . acetaminophen (TYLENOL) 500 MG tablet Take 1,000 mg by mouth every 6 (six) hours as needed for moderate pain.    Marland Kitchen apixaban (ELIQUIS) 5 MG TABS tablet Take 1 tablet (5 mg total) by mouth 2 (two) times daily. 56 tablet 0  . benzonatate (TESSALON) 200 MG capsule TAKE 1 CAPSULE TWICE DAILY AS NEEDED FOR COUGH 40 capsule 11  . fluticasone (FLONASE) 50 MCG/ACT nasal spray Place 2 sprays into both nostrils daily. (Patient taking differently: Place 2 sprays into both nostrils as needed. ) 16 g 6  . hydrochlorothiazide (HYDRODIURIL) 25 MG tablet Take 1 tablet (25 mg total) by mouth daily. 90 tablet 0  . lansoprazole (PREVACID) 15 MG capsule Take 15 mg by mouth as needed.    Marland Kitchen lisinopril (ZESTRIL) 20 MG tablet TAKE ONE (1) TABLET EACH DAY 90 tablet 3  . metoprolol succinate (TOPROL-XL) 25 MG 24 hr tablet TAKE 1 AND 1/2 TABLET TWICE A DAY 270 tablet 3  . PROAIR HFA 108 (90 Base) MCG/ACT inhaler Inhale 1-2 puffs into the lungs every 6 (six) hours as needed for wheezing or shortness of breath. 18 g 0  . promethazine (PHENERGAN) 12.5 MG tablet Take 1 tablet (12.5 mg total) by mouth every 8 (eight) hours as needed for nausea or vomiting. 20 tablet 0  . umeclidinium-vilanterol (ANORO ELLIPTA) 62.5-25 MCG/INH AEPB Inhale 1 puff into the lungs as needed.     . ALPRAZolam (XANAX) 0.25 MG tablet Take 1 tablet (0.25 mg total) by mouth 2 (two) times daily as needed for anxiety. (Patient not taking: Reported on 10/26/2019) 20 tablet 0  . HYDROcodone-homatropine (HYCODAN) 5-1.5 MG/5ML syrup Take 5 mLs by mouth every 6 (six) hours as needed for cough. (Patient not taking: Reported on 10/26/2019) 120 mL 0  . magic  mouthwash w/lidocaine SOLN Take 5 mLs by mouth 3 (three) times daily as needed for mouth pain. (Patient not taking: Reported on 11/22/2019) 100 mL 0  . predniSONE (DELTASONE) 10 MG tablet TAKE 1 TABLET EVERY MORNING WITH BREAKFAST (Patient not taking: Reported on 11/22/2019) 30 tablet 2   No current facility-administered medications for this visit.    Allergies as of 11/22/2019 - Review Complete 11/22/2019  Allergen Reaction Noted  . Darvocet [propoxyphene n-acetaminophen] Other (See Comments) 02/27/2012  . Doxycycline Itching 11/22/2019  . Nitrofurantoin monohyd macro Other (See Comments) 02/27/2012  . Omnicef [cefdinir]  10/05/2019  . Plavix [clopidogrel bisulfate] Swelling 03/02/2012  . Sulfa antibiotics Other (See Comments) 02/27/2012  . Penicillins Rash 02/27/2012    Family History  Problem Relation Age of Onset  . Cancer Father   . Cancer Mother   . Heart disease Sister        CABG age 29  . Cancer Brother     Social History   Socioeconomic History  . Marital status: Married    Spouse name: Not on file  . Number of children: Not on file  .  Years of education: Not on file  . Highest education level: Not on file  Occupational History  . Occupation: Retired  Tobacco Use  . Smoking status: Never Smoker  . Smokeless tobacco: Never Used  . Tobacco comment: Tobacco use-no  Substance and Sexual Activity  . Alcohol use: No    Alcohol/week: 0.0 standard drinks  . Drug use: No  . Sexual activity: Not on file  Other Topics Concern  . Not on file  Social History Narrative  . Not on file   Social Determinants of Health   Financial Resource Strain:   . Difficulty of Paying Living Expenses: Not on file  Food Insecurity:   . Worried About Charity fundraiser in the Last Year: Not on file  . Ran Out of Food in the Last Year: Not on file  Transportation Needs:   . Lack of Transportation (Medical): Not on file  . Lack of Transportation (Non-Medical): Not on file  Physical  Activity:   . Days of Exercise per Week: Not on file  . Minutes of Exercise per Session: Not on file  Stress:   . Feeling of Stress : Not on file  Social Connections:   . Frequency of Communication with Friends and Family: Not on file  . Frequency of Social Gatherings with Friends and Family: Not on file  . Attends Religious Services: Not on file  . Active Member of Clubs or Organizations: Not on file  . Attends Archivist Meetings: Not on file  . Marital Status: Not on file  Intimate Partner Violence:   . Fear of Current or Ex-Partner: Not on file  . Emotionally Abused: Not on file  . Physically Abused: Not on file  . Sexually Abused: Not on file    Review of Systems: General: Negative for anorexia, fever, chills, fatigue. ENT: Negative for hoarseness, difficulty swallowing. CV: Negative for chest pain, angina, palpitations, peripheral edema.  Respiratory: Negative for dyspnea at rest, cough, sputum, wheezing.  GI: See history of present illness. GU:  Admits dysuria.  MS: Negative for joint pain, low back pain.  Derm: Negative for rash or itching.  Endo: Negative for unusual weight change.  Heme: Negative for bruising or bleeding. Allergy: Negative for rash or hives.    Physical Exam: BP 131/69   Pulse 64   Temp (!) 97.2 F (36.2 C) (Temporal)   Ht 5\' 6"  (1.676 m)   Wt 231 lb (104.8 kg)   BMI 37.28 kg/m  General:   Alert and oriented. Pleasant and cooperative. Well-nourished and well-developed. Sitting in a wheelchair. Head:  Normocephalic and atraumatic. Eyes:  Without icterus, sclera clear and conjunctiva pink.  Ears:  Normal auditory acuity. Cardiovascular:  S1, S2 present without murmurs appreciated. Extremities with significant lymphedema. Respiratory:  Clear to auscultation bilaterally. No wheezes, rales, or rhonchi. No distress.  Gastrointestinal:  +BS, soft, non-tender and non-distended. No HSM noted. No guarding or rebound. No masses appreciated.   Rectal:  Deferred  Musculoskalatal:  Symmetrical without gross deformities. Skin:  Intact without significant lesions or rashes. Neurologic:  Alert and oriented x4;  grossly normal neurologically. Psych:  Alert and cooperative. Normal mood and affect. Heme/Lymph/Immune: No excessive bruising noted.    11/22/2019 2:47 PM   Disclaimer: This note was dictated with voice recognition software. Similar sounding words can inadvertently be transcribed and may not be corrected upon review.

## 2019-11-22 NOTE — Patient Instructions (Signed)
Your health issues we discussed today were:   Nausea, poor appetite, weight loss, weakness: 1. We will help schedule your ultrasound for you 2. You can have your labs completed in the same day as your ultrasound 3. I sent in a prescription for Zofran for nausea.  Take this 3 times a day, before meals to see if this helps with nausea 4. As we discussed, try making "Ensure milkshakes" with a couple scoops of ice cream in a blender with Ensure rather than milk. 5. If you do not like Ensure milkshake you can also try Carnation instant breakfast with 2% milk or whole milk.   6. Hopefully Ensure or Carnation will help with your weight loss 7. Call us if you have any worsening or severe symptoms  Overall I recommend:  1. Return for follow-up in 6 to 8 weeks 2. Call us if you have any questions or concerns 3. Continue your other current medications   Because of recent events of COVID-19 ("Coronavirus"), follow CDC recommendations:  Wash your hand frequently Avoid touching your face Stay away from people who are sick If you have symptoms such as fever, cough, shortness of breath then call your healthcare provider for further guidance If you are sick, STAY AT HOME unless otherwise directed by your healthcare provider. Follow directions from state and national officials regarding staying safe   At [facility] we value your feedback. You may receive a survey about your visit today. Please share your experience as we strive to create trusting relationships with our patients to provide genuine, compassionate, quality care.  We appreciate your understanding and patience as we review any laboratory studies, imaging, and other diagnostic tests that are ordered as we care for you. Our office policy is 5 business days for review of these results, and any emergent or urgent results are addressed in a timely manner for your best interest. If you do not hear from our office in 1 week, please  contact us.   We also encourage the use of MyChart, which contains your medical information for your review as well. If you are not enrolled in this feature, an access code is on this after visit summary for your convenience. Thank you for allowing [place] to be involved in your care.  It was great to see you today!  I hope you have a Happy New Year!!

## 2019-11-22 NOTE — Assessment & Plan Note (Signed)
Mildly elevated bilirubin in the emergency department.  CT was unremarkable.  At this point I will recheck an HFP and order right upper quadrant ultrasound to evaluate for biliary or hepatic etiology.  Follow-up in 6 to 8 weeks.  Further recommendations to follow.

## 2019-11-22 NOTE — Assessment & Plan Note (Signed)
Weight loss as a likely consequence of poor appetite and nausea.  Recommendations below for Zofran to help with nausea, evaluation with labs, ensure versus Carnation Instant Breakfast supplementation.  I feel we can get a handle on her nausea and improve her appetite her weight loss will resolve itself.  Call for any worsening or severe symptoms and follow-up in 6 to 8 weeks.

## 2019-11-22 NOTE — Assessment & Plan Note (Signed)
Poor appetite resulting in nausea and weight loss.  This all seems to have started just after her fall in October 2020.  She generally does not have an appetite.  She is not a big fan of Ensure.  She does like milkshakes.  I have recommended labs including CBC, HFP, BMP.  I described the use of Ensure milkshakes using ice cream and Ensure in a blender versus Carnation instant breakfast at least once a day, preferably twice a day to improve her nutritional intake.  Call for any worsening or severe symptoms follow-up in 6 to 8 weeks.

## 2019-11-23 ENCOUNTER — Other Ambulatory Visit: Payer: Self-pay | Admitting: Physician Assistant

## 2019-11-23 ENCOUNTER — Encounter: Payer: Self-pay | Admitting: Internal Medicine

## 2019-11-23 ENCOUNTER — Telehealth: Payer: Self-pay | Admitting: Physician Assistant

## 2019-11-23 MED ORDER — VANCOMYCIN HCL 250 MG PO CAPS: 250.0000 mg | ORAL_CAPSULE | Freq: Three times a day (TID) | ORAL | 0 refills | Status: DC

## 2019-11-23 NOTE — Telephone Encounter (Signed)
Patients daughter states that she is having burning when urinates and sick on stomach. Patients daughter states she was catheterized for culture because she could not get out of the bed.

## 2019-11-23 NOTE — Progress Notes (Signed)
Cc'ed to pcp °

## 2019-11-23 NOTE — Telephone Encounter (Signed)
Status:  Final result   Visible to patient:  Yes (MyChart) Next appt:  11/29/2019 at 11:30 AM in Radiology ([facility]) Specimen Information: Urine, Random      Component 4 wk ago  Specimen Description URINE, RANDOM  Performed at [facility], [address] [place], [place] [address]   Special Requests NONE  Performed at [facility], [address] [place], [place] [address]   Culture 50,000 COLONIES/mL ENTEROCOCCUS FAECALISAbnormal    Report Status 10/28/2019 FINAL   Organism ID, Bacteria ENTEROCOCCUS FAECALISAbnormal    Resulting Agency [facility]  Susceptibility   Enterococcus faecalis    MIC    AMPICILLIN <=2 SENSITIVE  Sensitive    NITROFURANTOIN <=16 SENSIT... Sensitive    VANCOMYCIN 2 SENSITIVE  Sensitive         Susceptibility Comments  Enterococcus faecalis  50,000 COLONIES/mL ENTEROCOCCUS FAECALIS      Specimen Collected: 10/26/19 16:40 Last Resulted: 10/28/19 08:02

## 2019-11-23 NOTE — Telephone Encounter (Signed)
Can you please call the patient's daughter and ask if she is actually having symptoms at this time.  I have reviewed the culture and it only had 50,000 colonies of bacteria and it was not a clean-catch.  She was having GI symptoms when she went to the emergency room.  So my concern is that it was obtained from the outside of the perineum.  Plus patient is allergic to everything that is used to treat that.  If she is not having symptoms then if we could try to get a clean-catch, as clean as possible And send it off again that would be appropriate.

## 2019-11-23 NOTE — Telephone Encounter (Signed)
A prescription has been sent to the pharmacy for vancomycin.  This is one of the antibiotics that does treat bacteria seen.

## 2019-11-23 NOTE — Telephone Encounter (Signed)
Aware of med

## 2019-11-29 ENCOUNTER — Ambulatory Visit (HOSPITAL_COMMUNITY)
Admission: RE | Admit: 2019-11-29 | Discharge: 2019-11-29 | Disposition: A | Discharge status: Home / Self Care | Payer: Medicare Other | Source: Ambulatory Visit | Attending: Nurse Practitioner | Admitting: Nurse Practitioner

## 2019-11-29 ENCOUNTER — Other Ambulatory Visit: Payer: Self-pay

## 2019-11-29 ENCOUNTER — Other Ambulatory Visit (HOSPITAL_COMMUNITY)
Admission: RE | Admit: 2019-11-29 | Discharge: 2019-11-29 | Disposition: A | Discharge status: Home / Self Care | Payer: Medicare Other | Source: Ambulatory Visit | Attending: Nurse Practitioner | Admitting: Nurse Practitioner

## 2019-11-29 ENCOUNTER — Ambulatory Visit (HOSPITAL_COMMUNITY): Payer: Medicare Other

## 2019-11-29 DIAGNOSIS — R634 Abnormal weight loss: Secondary | ICD-10-CM | POA: Insufficient documentation

## 2019-11-29 DIAGNOSIS — R110 Nausea: Secondary | ICD-10-CM | POA: Insufficient documentation

## 2019-11-29 DIAGNOSIS — R630 Anorexia: Secondary | ICD-10-CM | POA: Diagnosis not present

## 2019-11-29 LAB — CBC WITH DIFFERENTIAL/PLATELET
Abs Immature Granulocytes: 0.01 10*3/uL (ref 0.00–0.07)
Basophils Absolute: 0.1 10*3/uL (ref 0.0–0.1)
Basophils Relative: 1 %
Eosinophils Absolute: 0.4 10*3/uL (ref 0.0–0.5)
Eosinophils Relative: 6 %
HCT: 43 % (ref 36.0–46.0)
Hemoglobin: 12.5 g/dL (ref 12.0–15.0)
Immature Granulocytes: 0 %
Lymphocytes Relative: 35 %
Lymphs Abs: 2.7 10*3/uL (ref 0.7–4.0)
MCH: 30.6 pg (ref 26.0–34.0)
MCHC: 29.1 g/dL — ABNORMAL LOW (ref 30.0–36.0)
MCV: 105.4 fL — ABNORMAL HIGH (ref 80.0–100.0)
Monocytes Absolute: 0.8 10*3/uL (ref 0.1–1.0)
Monocytes Relative: 10 %
Neutro Abs: 3.8 10*3/uL (ref 1.7–7.7)
Neutrophils Relative %: 48 %
Platelets: 191 10*3/uL (ref 150–400)
RBC: 4.08 MIL/uL (ref 3.87–5.11)
RDW: 13.6 % (ref 11.5–15.5)
WBC: 7.7 10*3/uL (ref 4.0–10.5)
nRBC: 0 % (ref 0.0–0.2)

## 2019-11-29 LAB — BASIC METABOLIC PANEL
Anion gap: 7 (ref 5–15)
BUN: 21 mg/dL (ref 8–23)
CO2: 31 mmol/L (ref 22–32)
Calcium: 8.8 mg/dL — ABNORMAL LOW (ref 8.9–10.3)
Chloride: 104 mmol/L (ref 98–111)
Creatinine, Ser: 1.36 mg/dL — ABNORMAL HIGH (ref 0.44–1.00)
GFR calc Af Amer: 41 mL/min — ABNORMAL LOW (ref >60)
GFR calc non Af Amer: 36 mL/min — ABNORMAL LOW (ref >60)
Glucose, Bld: 110 mg/dL — ABNORMAL HIGH (ref 70–99)
Potassium: 5.4 mmol/L — ABNORMAL HIGH (ref 3.5–5.1)
Sodium: 142 mmol/L (ref 135–145)

## 2019-11-29 LAB — HEPATIC FUNCTION PANEL
ALT: 13 U/L (ref 0–44)
AST: 20 U/L (ref 15–41)
Albumin: 2.4 g/dL — ABNORMAL LOW (ref 3.5–5.0)
Alkaline Phosphatase: 93 U/L (ref 38–126)
Bilirubin, Direct: 0.2 mg/dL (ref 0.0–0.2)
Indirect Bilirubin: 0.8 mg/dL (ref 0.3–0.9)
Total Bilirubin: 1 mg/dL (ref 0.3–1.2)
Total Protein: 6.4 g/dL — ABNORMAL LOW (ref 6.5–8.1)

## 2019-12-09 ENCOUNTER — Encounter: Payer: Self-pay | Admitting: Family Medicine

## 2019-12-09 ENCOUNTER — Ambulatory Visit (INDEPENDENT_AMBULATORY_CARE_PROVIDER_SITE_OTHER): Payer: Medicare Other | Admitting: Family Medicine

## 2019-12-09 VITALS — BP 146/62 | HR 74 | Temp 97.8°F

## 2019-12-09 DIAGNOSIS — I890 Lymphedema, not elsewhere classified: Secondary | ICD-10-CM | POA: Diagnosis not present

## 2019-12-09 MED ORDER — "XEROFORM PETROLAT GAUZE 5""X9"" EX MISC": 1.0000 "application " | Freq: Every day | CUTANEOUS | 2 refills | Status: DC

## 2019-12-09 NOTE — Progress Notes (Signed)
Assessment & Plan:  1. Lymphedema - Encouraged daily application of compression wraps in following way: xeroform over erythema/weeping areas, CeraVe cream over thick/flaky skin, wrap with kerlix, followed by compression wrap of coban. Patient/daughter to contact company that supplied lymphedema boots to obtain a new pair and resume using daily.  - Bismuth Tribromoph-Petrolatum (XEROFORM PETROLAT GAUZE 5"X9") MISC; Apply 1 application topically daily. (both legs)  Dispense: 30 each; Refill: 2   Follow up plan: Return as scheduled with PCP.  Deliah Boston, MSN, APRN, FNP-C [facility]  Subjective:   Patient ID: KHYLI SWAIM, female    DOB: 08-09-1933, 86 y.o.   MRN: [id number]  HPI: BRONDA ALFRED is a 86 y.o. female presenting on 12/09/2019 for Leg Swelling (bilateral - Patient states that her bilateral legs an feet have been swelling and draining.)  Patient is accompanied by her daughter who she is okay with being present.  Patient has a history of bilateral lower extremity lymphedema.  She has been to a lymphedema clinic and has in-home boots that she is supposed to use regularly.  She has not been using them for the past couple of months as she reports when the air gets in them and circulates it started burning her legs and causing blisters.  Her daughter reports there is not a way to release them.  She has had these boots for several years now and wonders if it is time to get a replacement.  Meanwhile for the past 2 weeks her legs have been weeping.  Her daughter has been applying wraps with Coban in the morning and removing them at night.  They report they're actually looking much better today than the last few days and they almost canceled this appointment but decided to come anyway. Patient is unable to apply compression hose due to size of her legs.    ROS: Negative unless specifically indicated above in HPI.   Relevant past medical history reviewed and  updated as indicated.    Allergies and medications reviewed and updated.   Current Outpatient Medications:  .  acetaminophen (TYLENOL) 500 MG tablet, Take 1,000 mg by mouth every 6 (six) hours as needed for moderate pain., Disp: , Rfl:  .  apixaban (ELIQUIS) 5 MG TABS tablet, Take 1 tablet (5 mg total) by mouth 2 (two) times daily., Disp: 56 tablet, Rfl: 0 .  benzonatate (TESSALON) 200 MG capsule, TAKE 1 CAPSULE TWICE DAILY AS NEEDED FOR COUGH, Disp: 40 capsule, Rfl: 11 .  fluticasone (FLONASE) 50 MCG/ACT nasal spray, Place 2 sprays into both nostrils daily. (Patient taking differently: Place 2 sprays into both nostrils as needed. ), Disp: 16 g, Rfl: 6 .  hydrochlorothiazide (HYDRODIURIL) 25 MG tablet, Take 1 tablet (25 mg total) by mouth daily., Disp: 90 tablet, Rfl: 0 .  lansoprazole (PREVACID) 15 MG capsule, Take 15 mg by mouth as needed., Disp: , Rfl:  .  lisinopril (ZESTRIL) 20 MG tablet, TAKE ONE (1) TABLET EACH DAY, Disp: 90 tablet, Rfl: 3 .  metoprolol succinate (TOPROL-XL) 25 MG 24 hr tablet, TAKE 1 AND 1/2 TABLET TWICE A DAY, Disp: 270 tablet, Rfl: 3 .  ondansetron (ZOFRAN) 4 MG tablet, Take 1 tablet (4 mg total) by mouth 3 (three) times daily before meals., Disp: 90 tablet, Rfl: 2 .  PROAIR HFA 108 (90 Base) MCG/ACT inhaler, Inhale 1-2 puffs into the lungs every 6 (six) hours as needed for wheezing or shortness of breath., Disp: 18 g, Rfl: 0 .  promethazine (PHENERGAN) 12.5 MG tablet, Take 1 tablet (12.5 mg total) by mouth every 8 (eight) hours as needed for nausea or vomiting., Disp: 20 tablet, Rfl: 0 .  umeclidinium-vilanterol (ANORO ELLIPTA) 62.5-25 MCG/INH AEPB, Inhale 1 puff into the lungs as needed. , Disp: , Rfl:  .  Bismuth Tribromoph-Petrolatum (XEROFORM PETROLAT GAUZE 5"X9") MISC, Apply 1 application topically daily. (both legs), Disp: 30 each, Rfl: 2  Allergies  Allergen Reactions  . Darvocet [Propoxyphene N-Acetaminophen] Other (See Comments)    unknown  . Doxycycline  Itching  . Nitrofurantoin Monohyd Macro Other (See Comments)    unknown  . Omnicef [Cefdinir]     Sick, rash  . Plavix [Clopidogrel Bisulfate] Swelling    Face swells  . Sulfa Antibiotics Other (See Comments)    "face got puffy"  . Penicillins Rash    Has patient had a PCN reaction causing immediate rash, facial/tongue/throat swelling, SOB or lightheadedness with hypotension: Yes Has patient had a PCN reaction causing severe rash involving mucus membranes or skin necrosis: No Has patient had a PCN reaction that required hospitalization: No Has patient had a PCN reaction occurring within the last 10 years: No If all of the above answers are "NO", then may proceed with Cephalosporin use.     Objective:   BP (!) 146/62   Pulse 74   Temp 97.8 F (36.6 C) (Temporal)   SpO2 93%    Physical Exam Vitals reviewed.  Constitutional:      General: She is not in acute distress.    Appearance: Normal appearance. She is not ill-appearing, toxic-appearing or diaphoretic.  HENT:     Head: Normocephalic and atraumatic.  Eyes:     General: No scleral icterus.       Right eye: No discharge.        Left eye: No discharge.     Conjunctiva/sclera: Conjunctivae normal.  Cardiovascular:     Rate and Rhythm: Normal rate.  Pulmonary:     Effort: Pulmonary effort is normal. No respiratory distress.  Musculoskeletal:        General: Normal range of motion.     Cervical back: Normal range of motion.     Right lower leg: 2+ Edema present.     Left lower leg: 2+ Edema present.  Skin:    General: Skin is warm and dry.     Capillary Refill: Capillary refill takes less than 2 seconds.     Comments: Weeping to lateral aspects of lower extremities with mild erythema. The rest of her legs have thick flaking skin.   Neurological:     General: No focal deficit present.     Mental Status: She is alert and oriented to person, place, and time. Mental status is at baseline.  Psychiatric:        Mood and  Affect: Mood normal.        Behavior: Behavior normal.        Thought Content: Thought content normal.        Judgment: Judgment normal.    Dressings applied to BLE: xeroform over weeping/erythematous areas; legs wrapped with kerlix and coban.

## 2019-12-11 ENCOUNTER — Encounter: Payer: Self-pay | Admitting: Family Medicine

## 2020-01-02 ENCOUNTER — Telehealth: Payer: Self-pay | Admitting: Cardiology

## 2020-01-02 NOTE — Telephone Encounter (Signed)

## 2020-01-08 NOTE — Progress Notes (Signed)
Virtual Visit via Telephone Note   This visit type was conducted due to national recommendations for restrictions regarding the COVID-19 Pandemic (e.g. social distancing) in an effort to limit this patient's exposure and mitigate transmission in our community.  Due to her co-morbid illnesses, this patient is at least at moderate risk for complications without adequate follow up.  This format is felt to be most appropriate for this patient at this time.  The patient did not have access to video technology/had technical difficulties with video requiring transitioning to audio format only (telephone).  All issues noted in this document were discussed and addressed.  No physical exam could be performed with this format.  Please refer to the patient's chart for her  consent to telehealth for [facility].   Date:  01/09/2020   ID:  Courtney Berry, DOB 04/29/1933, MRN [id number]  Patient Location: Home Provider Location: Home  PCP:  Terald Sleeper, PA-C  Cardiologist:  Rozann Lesches, MD Electrophysiologist:  None   Evaluation Performed:  Follow-Up Visit  Chief Complaint:  Cardiac follow-up  History of Present Illness:    Courtney Berry is an 86 y.o. female last assessed via telehealth encounter in July 2020.  We spoke by phone again today.  She reports no major change, still has intermittent trouble with leg swelling and pain, has been wrapping her legs more recently.  I reviewed her current medications which are outlined below.  She does not report any spontaneous bleeding problems on Eliquis.  She had lab work in January per [facility] which I also reviewed.  The patient does not have symptoms concerning for COVID-19 infection (fever, chills, cough, or new shortness of breath).  She states that she is considering getting the vaccine.   Past Medical History:  Diagnosis Date  . COPD (chronic obstructive pulmonary disease) ([place])   . Diverticulosis   . Essential hypertension   . History  of chest pain    Low risk Cardiolite 2010  . Lymphedema   . Mixed hyperlipidemia   . PAF (paroxysmal atrial fibrillation) ([place])   . Rheumatoid arthritis [facility])    Past Surgical History:  Procedure Laterality Date  . TOTAL ABDOMINAL HYSTERECTOMY  1969     Current Meds  Medication Sig  . acetaminophen (TYLENOL) 500 MG tablet Take 1,000 mg by mouth every 6 (six) hours as needed for moderate pain.  Marland Kitchen apixaban (ELIQUIS) 5 MG TABS tablet Take 1 tablet (5 mg total) by mouth 2 (two) times daily.  . benzonatate (TESSALON) 200 MG capsule TAKE 1 CAPSULE TWICE DAILY AS NEEDED FOR COUGH  . Bismuth Tribromoph-Petrolatum (XEROFORM PETROLAT GAUZE 5"X9") MISC Apply 1 application topically daily. (both legs)  . fluticasone (FLONASE) 50 MCG/ACT nasal spray Place 2 sprays into both nostrils daily. (Patient taking differently: Place 2 sprays into both nostrils as needed. )  . hydrochlorothiazide (HYDRODIURIL) 25 MG tablet Take 1 tablet (25 mg total) by mouth daily.  . lansoprazole (PREVACID) 15 MG capsule Take 15 mg by mouth as needed.  Marland Kitchen lisinopril (ZESTRIL) 20 MG tablet TAKE ONE (1) TABLET EACH DAY  . metoprolol succinate (TOPROL-XL) 25 MG 24 hr tablet TAKE 1 AND 1/2 TABLET TWICE A DAY  . ondansetron (ZOFRAN) 4 MG tablet Take 1 tablet (4 mg total) by mouth 3 (three) times daily before meals.  Marland Kitchen PROAIR HFA 108 (90 Base) MCG/ACT inhaler Inhale 1-2 puffs into the lungs every 6 (six) hours as needed for wheezing or shortness of breath.  Marland Kitchen  promethazine (PHENERGAN) 12.5 MG tablet Take 1 tablet (12.5 mg total) by mouth every 8 (eight) hours as needed for nausea or vomiting.  . umeclidinium-vilanterol (ANORO ELLIPTA) 62.5-25 MCG/INH AEPB Inhale 1 puff into the lungs as needed.      Allergies:   Darvocet [propoxyphene n-acetaminophen], Doxycycline, Nitrofurantoin monohyd macro, Omnicef [cefdinir], Plavix [clopidogrel bisulfate], Sulfa antibiotics, and Penicillins   ROS:   Chronic leg swelling.    Prior CV  studies:   The following studies were reviewed today:  Lexiscan Myoview 08/30/2018:  There was no ST segment deviation noted during stress.  The study is normal. No ischemia or scar.  This is a low risk study.  Nuclear stress EF: 77%.  Labs/Other Tests and Data Reviewed:    EKG:  An ECG dated 10/26/2019 was personally reviewed today and demonstrated:  Sinus rhythm with borderline low voltage.  Recent Labs: 10/26/2019: Magnesium 1.7 11/29/2019: ALT 13; BUN 21; Creatinine, Ser 1.36; Hemoglobin 12.5; Platelets 191; Potassium 5.4; Sodium 142   Recent Lipid Panel Lab Results  Component Value Date/Time   CHOL 145 09/22/2019 12:24 PM   TRIG 178 (H) 09/22/2019 12:24 PM   HDL 40 09/22/2019 12:24 PM   CHOLHDL 3.6 09/22/2019 12:24 PM   CHOLHDL 3.7 08/02/2018 04:17 AM   LDLCALC 75 09/22/2019 12:24 PM    Wt Readings from Last 3 Encounters:  01/09/20 232 lb (105.2 kg)  11/22/19 231 lb (104.8 kg)  10/26/19 230 lb (104.3 kg)     Objective:    Vital Signs:  BP (!) 152/80   Ht 5\' 8"  (1.727 m)   Wt 232 lb (105.2 kg)   BMI 35.28 kg/m    Patient spoke in full sentences, not short of breath. No audible wheezing or coughing. Speech pattern normal.  ASSESSMENT & PLAN:    1.  Paroxysmal atrial fibrillation.  She reports intermittent palpitations, has used three extra doses of Toprol-XL in the last 6 months, otherwise remains on stable regimen which also includes Eliquis.  No reported bleeding episodes.  I reviewed her lab work from January.  Continue observation.  2.  Chronic lymphedema.  She has been wrapping her legs recently.  I did talk with her about considering referral for PT if this continues, she has done this in the past.  3.  Essential hypertension, blood pressure elevated today.  She is on Toprol-XL, lisinopril, and HCTZ.  Keep follow-up with PCP.   Time:   Today, I have spent 5 minutes with the patient with telehealth technology discussing the above problems.      Medication Adjustments/Labs and Tests Ordered: Current medicines are reviewed at length with the patient today.  Concerns regarding medicines are outlined above.   Tests Ordered: No orders of the defined types were placed in this encounter.   Medication Changes: No orders of the defined types were placed in this encounter.   Follow Up:  In Person 6 months in the [place] office with [place].  Signed, [place], MD  01/09/2020 10:29 AM    [facility]

## 2020-01-09 ENCOUNTER — Encounter: Payer: Self-pay | Admitting: Cardiology

## 2020-01-09 ENCOUNTER — Telehealth (INDEPENDENT_AMBULATORY_CARE_PROVIDER_SITE_OTHER): Payer: Medicare Other | Admitting: Cardiology

## 2020-01-09 VITALS — BP 152/80 | Ht 68.0 in | Wt 232.0 lb

## 2020-01-09 DIAGNOSIS — I480 Paroxysmal atrial fibrillation: Secondary | ICD-10-CM | POA: Diagnosis not present

## 2020-01-09 DIAGNOSIS — I10 Essential (primary) hypertension: Secondary | ICD-10-CM

## 2020-01-09 DIAGNOSIS — I890 Lymphedema, not elsewhere classified: Secondary | ICD-10-CM | POA: Diagnosis not present

## 2020-01-09 NOTE — Patient Instructions (Signed)
Medication Instructions:  Your physician recommends that you continue on your current medications as directed. Please refer to the Current Medication list given to you today.  *If you need a refill on your cardiac medications before your next appointment, please call your pharmacy*  Lab Work: NONE   If you have labs (blood work) drawn today and your tests are completely normal, you will receive your results only by: Marland Kitchen MyChart Message (if you have MyChart) OR . A paper copy in the mail If you have any lab test that is abnormal or we need to change your treatment, we will call you to review the results.  Testing/Procedures: NONE   Follow-Up: At [facility], you and your health needs are our priority.  As part of our continuing mission to provide you with exceptional heart care, we have created designated Provider Care Teams.  These Care Teams include your primary Cardiologist (physician) and Advanced Practice Providers (APPs -  Physician Assistants and Nurse Practitioners) who all work together to provide you with the care you need, when you need it.  Your next appointment:   6 month(s)  The format for your next appointment:   In Person  Provider:   Randall An, PA-C  Other Instructions Thank you for choosing [facility]!

## 2020-01-20 ENCOUNTER — Other Ambulatory Visit: Payer: Self-pay | Admitting: *Deleted

## 2020-01-20 MED ORDER — APIXABAN 5 MG PO TABS: 5.0000 mg | ORAL_TABLET | Freq: Two times a day (BID) | ORAL | 11 refills | Status: DC

## 2020-01-24 ENCOUNTER — Other Ambulatory Visit: Payer: Self-pay

## 2020-01-24 MED ORDER — APIXABAN 5 MG PO TABS: 5.0000 mg | ORAL_TABLET | Freq: Two times a day (BID) | ORAL | 6 refills | Status: DC

## 2020-01-24 NOTE — Telephone Encounter (Signed)
Filled eliquis

## 2020-01-25 ENCOUNTER — Ambulatory Visit: Payer: Medicare Other | Admitting: Nurse Practitioner

## 2020-02-06 ENCOUNTER — Ambulatory Visit: Payer: Medicare Other | Admitting: Physician Assistant

## 2020-02-20 ENCOUNTER — Ambulatory Visit (INDEPENDENT_AMBULATORY_CARE_PROVIDER_SITE_OTHER): Payer: Medicare Other | Admitting: Family Medicine

## 2020-02-20 ENCOUNTER — Other Ambulatory Visit: Payer: Self-pay

## 2020-02-20 ENCOUNTER — Encounter: Payer: Self-pay | Admitting: Family Medicine

## 2020-02-20 VITALS — BP 185/84 | HR 66 | Temp 98.0°F

## 2020-02-20 DIAGNOSIS — L03116 Cellulitis of left lower limb: Secondary | ICD-10-CM

## 2020-02-20 DIAGNOSIS — G629 Polyneuropathy, unspecified: Secondary | ICD-10-CM | POA: Diagnosis not present

## 2020-02-20 DIAGNOSIS — J441 Chronic obstructive pulmonary disease with (acute) exacerbation: Secondary | ICD-10-CM | POA: Diagnosis not present

## 2020-02-20 DIAGNOSIS — I890 Lymphedema, not elsewhere classified: Secondary | ICD-10-CM | POA: Diagnosis not present

## 2020-02-20 DIAGNOSIS — I10 Essential (primary) hypertension: Secondary | ICD-10-CM | POA: Diagnosis not present

## 2020-02-20 MED ORDER — HYDROCHLOROTHIAZIDE 25 MG PO TABS: 25.0000 mg | ORAL_TABLET | Freq: Every day | ORAL | 0 refills | Status: DC

## 2020-02-20 MED ORDER — CEPHALEXIN 250 MG PO CAPS: 250.0000 mg | ORAL_CAPSULE | Freq: Four times a day (QID) | ORAL | 0 refills | Status: DC

## 2020-02-20 MED ORDER — PREDNISONE 10 MG (21) PO TBPK: ORAL_TABLET | ORAL | 0 refills | Status: DC

## 2020-02-20 MED ORDER — GABAPENTIN 100 MG PO CAPS: 100.0000 mg | ORAL_CAPSULE | Freq: Every day | ORAL | 2 refills | Status: DC

## 2020-02-20 NOTE — Patient Instructions (Signed)
DASH Eating Plan DASH stands for "Dietary Approaches to Stop Hypertension." The DASH eating plan is a healthy eating plan that has been shown to reduce high blood pressure (hypertension). It may also reduce your risk for type 2 diabetes, heart disease, and stroke. The DASH eating plan may also help with weight loss. What are tips for following this plan?  General guidelines  Avoid eating more than 2,300 mg (milligrams) of salt (sodium) a day. If you have hypertension, you may need to reduce your sodium intake to 1,500 mg a day.  Limit alcohol intake to no more than 1 drink a day for nonpregnant women and 2 drinks a day for men. One drink equals 12 oz of beer, 5 oz of wine, or 1 oz of hard liquor.  Work with your health care provider to maintain a healthy body weight or to lose weight. Ask what an ideal weight is for you.  Get at least 30 minutes of exercise that causes your heart to beat faster (aerobic exercise) most days of the week. Activities may include walking, swimming, or biking.  Work with your health care provider or diet and nutrition specialist (dietitian) to adjust your eating plan to your individual calorie needs. Reading food labels   Check food labels for the amount of sodium per serving. Choose foods with less than 5 percent of the Daily Value of sodium. Generally, foods with less than 300 mg of sodium per serving fit into this eating plan.  To find whole grains, look for the word "whole" as the first word in the ingredient list. Shopping  Buy products labeled as "low-sodium" or "no salt added."  Buy fresh foods. Avoid canned foods and premade or frozen meals. Cooking  Avoid adding salt when cooking. Use salt-free seasonings or herbs instead of table salt or sea salt. Check with your health care provider or pharmacist before using salt substitutes.  Do not fry foods. Cook foods using healthy methods such as baking, boiling, grilling, and broiling instead.  Cook with  heart-healthy oils, such as olive, canola, soybean, or sunflower oil. Meal planning  Eat a balanced diet that includes: ? 5 or more servings of fruits and vegetables each day. At each meal, try to fill half of your plate with fruits and vegetables. ? Up to 6-8 servings of whole grains each day. ? Less than 6 oz of lean meat, poultry, or fish each day. A 3-oz serving of meat is about the same size as a deck of cards. One egg equals 1 oz. ? 2 servings of low-fat dairy each day. ? A serving of nuts, seeds, or beans 5 times each week. ? Heart-healthy fats. Healthy fats called Omega-3 fatty acids are found in foods such as flaxseeds and coldwater fish, like sardines, salmon, and mackerel.  Limit how much you eat of the following: ? Canned or prepackaged foods. ? Food that is high in trans fat, such as fried foods. ? Food that is high in saturated fat, such as fatty meat. ? Sweets, desserts, sugary drinks, and other foods with added sugar. ? Full-fat dairy products.  Do not salt foods before eating.  Try to eat at least 2 vegetarian meals each week.  Eat more home-cooked food and less restaurant, buffet, and fast food.  When eating at a restaurant, ask that your food be prepared with less salt or no salt, if possible. What foods are recommended? The items listed may not be a complete list. Talk with your dietitian about   what dietary choices are best for you. Grains Whole-grain or whole-wheat bread. Whole-grain or whole-wheat pasta. Brown rice. Oatmeal. Quinoa. Bulgur. Whole-grain and low-sodium cereals. Pita bread. Low-fat, low-sodium crackers. Whole-wheat flour tortillas. Vegetables Fresh or frozen vegetables (raw, steamed, roasted, or grilled). Low-sodium or reduced-sodium tomato and vegetable juice. Low-sodium or reduced-sodium tomato sauce and tomato paste. Low-sodium or reduced-sodium canned vegetables. Fruits All fresh, dried, or frozen fruit. Canned fruit in natural juice (without  added sugar). Meat and other protein foods Skinless chicken or turkey. Ground chicken or turkey. Pork with fat trimmed off. Fish and seafood. Egg whites. Dried beans, peas, or lentils. Unsalted nuts, nut butters, and seeds. Unsalted canned beans. Lean cuts of beef with fat trimmed off. Low-sodium, lean deli meat. Dairy Low-fat (1%) or fat-free (skim) milk. Fat-free, low-fat, or reduced-fat cheeses. Nonfat, low-sodium ricotta or cottage cheese. Low-fat or nonfat yogurt. Low-fat, low-sodium cheese. Fats and oils Soft margarine without trans fats. Vegetable oil. Low-fat, reduced-fat, or light mayonnaise and salad dressings (reduced-sodium). Canola, safflower, olive, soybean, and sunflower oils. Avocado. Seasoning and other foods Herbs. Spices. Seasoning mixes without salt. Unsalted popcorn and pretzels. Fat-free sweets. What foods are not recommended? The items listed may not be a complete list. Talk with your dietitian about what dietary choices are best for you. Grains Baked goods made with fat, such as croissants, muffins, or some breads. Dry pasta or rice meal packs. Vegetables Creamed or fried vegetables. Vegetables in a cheese sauce. Regular canned vegetables (not low-sodium or reduced-sodium). Regular canned tomato sauce and paste (not low-sodium or reduced-sodium). Regular tomato and vegetable juice (not low-sodium or reduced-sodium). Pickles. Olives. Fruits Canned fruit in a light or heavy syrup. Fried fruit. Fruit in cream or butter sauce. Meat and other protein foods Fatty cuts of meat. Ribs. Fried meat. Bacon. Sausage. Bologna and other processed lunch meats. Salami. Fatback. Hotdogs. Bratwurst. Salted nuts and seeds. Canned beans with added salt. Canned or smoked fish. Whole eggs or egg yolks. Chicken or turkey with skin. Dairy Whole or 2% milk, cream, and half-and-half. Whole or full-fat cream cheese. Whole-fat or sweetened yogurt. Full-fat cheese. Nondairy creamers. Whipped toppings.  Processed cheese and cheese spreads. Fats and oils Butter. Stick margarine. Lard. Shortening. Ghee. Bacon fat. Tropical oils, such as coconut, palm kernel, or palm oil. Seasoning and other foods Salted popcorn and pretzels. Onion salt, garlic salt, seasoned salt, table salt, and sea salt. Worcestershire sauce. Tartar sauce. Barbecue sauce. Teriyaki sauce. Soy sauce, including reduced-sodium. Steak sauce. Canned and packaged gravies. Fish sauce. Oyster sauce. Cocktail sauce. Horseradish that you find on the shelf. Ketchup. Mustard. Meat flavorings and tenderizers. Bouillon cubes. Hot sauce and Tabasco sauce. Premade or packaged marinades. Premade or packaged taco seasonings. Relishes. Regular salad dressings. Where to find more information:  National Heart, Lung, and Blood Institute: www.nhlbi.nih.gov  American Heart Association: www.heart.org Summary  The DASH eating plan is a healthy eating plan that has been shown to reduce high blood pressure (hypertension). It may also reduce your risk for type 2 diabetes, heart disease, and stroke.  With the DASH eating plan, you should limit salt (sodium) intake to 2,300 mg a day. If you have hypertension, you may need to reduce your sodium intake to 1,500 mg a day.  When on the DASH eating plan, aim to eat more fresh fruits and vegetables, whole grains, lean proteins, low-fat dairy, and heart-healthy fats.  Work with your health care provider or diet and nutrition specialist (dietitian) to adjust your eating plan to your   individual calorie needs. This information is not intended to replace advice given to you by your health care provider. Make sure you discuss any questions you have with your health care provider. Document Revised: 10/16/2017 Document Reviewed: 10/27/2016 Elsevier Patient Education  2020 Elsevier Inc.  

## 2020-02-20 NOTE — Progress Notes (Signed)
Assessment & Plan:  1. Cellulitis of left lower extremity - Rx'd Keflex.   2. Lymphedema of both lower extremities - Ambulatory referral to Occupational Therapy  3. Neuropathy - gabapentin (NEURONTIN) 100 MG capsule; Take 1 capsule (100 mg total) by mouth at bedtime.  Dispense: 30 capsule; Refill: 2  4. COPD exacerbation (HCC) - predniSONE (STERAPRED UNI-PAK 21 TAB) 10 MG (21) TBPK tablet; As directed x 6 days  Dispense: 21 tablet; Refill: 0  5. Essential Hypertension - Education provided on the DASH diet. Advised to keep a log of BP readings and bring with her to the next appointment.    Follow up plan: Return in about 4 weeks (around 03/19/2020) for HTN/neuropathy.  Hendricks Limes, MSN, APRN, FNP-C [facility]  Subjective:   Patient ID: Courtney Berry, female    DOB: 05/10/1933, 86 y.o.   MRN: [id number]  HPI: Courtney Berry is a 86 y.o. female presenting on 02/20/2020 for Lymphedema (bilateral lower extremity. Patient states it has been going on for 2 years.)  Patient has a history of lymphedema for the past two years. She went to a lymphedema clinic in [place] ~2 years ago. She has boots at home that she uses to help as well. Recently she has started having an increase in swelling, erythema, and warmth to the left foot.   Patient also c/o burning and tingling in her feet at night. She used to be on gabapentin which was helpful. She reports the was taken off of it when she had a reaction to a medication. She does not believe she was reaction to the gabapentin, but something else she was put on around that time. She would like to try the gabapentin again.   Patient also concerned about her breathing. She reports she has having a more difficult time and is having a more productive cough. She is requesting some steroids.    ROS: Negative unless specifically indicated above in HPI.   Relevant past medical history reviewed and updated as indicated.    Allergies and medications reviewed and updated.   Current Outpatient Medications:  .  acetaminophen (TYLENOL) 500 MG tablet, Take 1,000 mg by mouth every 6 (six) hours as needed for moderate pain., Disp: , Rfl:  .  apixaban (ELIQUIS) 5 MG TABS tablet, Take 1 tablet (5 mg total) by mouth 2 (two) times daily., Disp: 60 tablet, Rfl: 6 .  benzonatate (TESSALON) 200 MG capsule, TAKE 1 CAPSULE TWICE DAILY AS NEEDED FOR COUGH, Disp: 40 capsule, Rfl: 11 .  Bismuth Tribromoph-Petrolatum (XEROFORM PETROLAT GAUZE 5"X9") MISC, Apply 1 application topically daily. (both legs), Disp: 30 each, Rfl: 2 .  fluticasone (FLONASE) 50 MCG/ACT nasal spray, Place 2 sprays into both nostrils daily. (Patient taking differently: Place 2 sprays into both nostrils as needed. ), Disp: 16 g, Rfl: 6 .  lansoprazole (PREVACID) 15 MG capsule, Take 15 mg by mouth as needed., Disp: , Rfl:  .  lisinopril (ZESTRIL) 20 MG tablet, TAKE ONE (1) TABLET EACH DAY, Disp: 90 tablet, Rfl: 3 .  metoprolol succinate (TOPROL-XL) 25 MG 24 hr tablet, TAKE 1 AND 1/2 TABLET TWICE A DAY, Disp: 270 tablet, Rfl: 3 .  ondansetron (ZOFRAN) 4 MG tablet, Take 1 tablet (4 mg total) by mouth 3 (three) times daily before meals., Disp: 90 tablet, Rfl: 2 .  PROAIR HFA 108 (90 Base) MCG/ACT inhaler, Inhale 1-2 puffs into the lungs every 6 (six) hours as needed for wheezing or shortness of breath.,  Disp: 18 g, Rfl: 0 .  promethazine (PHENERGAN) 12.5 MG tablet, Take 1 tablet (12.5 mg total) by mouth every 8 (eight) hours as needed for nausea or vomiting., Disp: 20 tablet, Rfl: 0 .  umeclidinium-vilanterol (ANORO ELLIPTA) 62.5-25 MCG/INH AEPB, Inhale 1 puff into the lungs as needed. , Disp: , Rfl:  .  clindamycin (CLEOCIN) 300 MG capsule, Take 1 capsule (300 mg total) by mouth 4 (four) times daily for 10 days., Disp: 40 capsule, Rfl: 0 .  gabapentin (NEURONTIN) 100 MG capsule, Take 1 capsule (100 mg total) by mouth at bedtime., Disp: 30 capsule, Rfl: 2 .   hydrochlorothiazide (HYDRODIURIL) 25 MG tablet, Take 1 tablet (25 mg total) by mouth daily., Disp: 90 tablet, Rfl: 0 .  predniSONE (STERAPRED UNI-PAK 21 TAB) 10 MG (21) TBPK tablet, As directed x 6 days, Disp: 21 tablet, Rfl: 0  Allergies  Allergen Reactions  . Darvocet [Propoxyphene N-Acetaminophen] Other (See Comments)    unknown  . Doxycycline Itching  . Nitrofurantoin Monohyd Macro Other (See Comments)    unknown  . Omnicef [Cefdinir]     Sick, rash  . Plavix [Clopidogrel Bisulfate] Swelling    Face swells  . Sulfa Antibiotics Other (See Comments)    "face got puffy"  . Penicillins Rash    Has patient had a PCN reaction causing immediate rash, facial/tongue/throat swelling, SOB or lightheadedness with hypotension: Yes Has patient had a PCN reaction causing severe rash involving mucus membranes or skin necrosis: No Has patient had a PCN reaction that required hospitalization: No Has patient had a PCN reaction occurring within the last 10 years: No If all of the above answers are "NO", then may proceed with Cephalosporin use.     Objective:   BP (!) 185/84   Pulse 66   Temp 98 F (36.7 C) (Temporal)   SpO2 92%    Physical Exam Vitals reviewed.  Constitutional:      General: She is not in acute distress.    Appearance: Normal appearance. She is not ill-appearing, toxic-appearing or diaphoretic.  HENT:     Head: Normocephalic and atraumatic.  Eyes:     General: No scleral icterus.       Right eye: No discharge.        Left eye: No discharge.     Conjunctiva/sclera: Conjunctivae normal.  Cardiovascular:     Rate and Rhythm: Normal rate and regular rhythm.     Heart sounds: Normal heart sounds. No murmur. No friction rub. No gallop.   Pulmonary:     Effort: Pulmonary effort is normal. No respiratory distress.     Breath sounds: Normal breath sounds. No stridor. No wheezing, rhonchi or rales.  Musculoskeletal:        General: Normal range of motion.     Cervical  back: Normal range of motion.     Right lower leg: 2+ Edema present.     Left lower leg: 2+ Edema present.  Skin:    General: Skin is warm and dry.     Capillary Refill: Capillary refill takes less than 2 seconds.     Findings: Erythema (and warmth to left foot) present.  Neurological:     General: No focal deficit present.     Mental Status: She is alert and oriented to person, place, and time. Mental status is at baseline.  Psychiatric:        Mood and Affect: Mood normal.        Behavior: Behavior normal.  Thought Content: Thought content normal.        Judgment: Judgment normal.

## 2020-02-22 ENCOUNTER — Telehealth: Payer: Self-pay | Admitting: Family Medicine

## 2020-02-22 DIAGNOSIS — I890 Lymphedema, not elsewhere classified: Secondary | ICD-10-CM

## 2020-02-22 MED ORDER — HYDROCHLOROTHIAZIDE 25 MG PO TABS: 25.0000 mg | ORAL_TABLET | Freq: Every day | ORAL | 0 refills | Status: DC

## 2020-02-22 MED ORDER — CLINDAMYCIN HCL 300 MG PO CAPS: 300.0000 mg | ORAL_CAPSULE | Freq: Four times a day (QID) | ORAL | 0 refills | Status: AC

## 2020-02-22 NOTE — Telephone Encounter (Signed)
My apologies, the prescription did not send electronically the other day. I sent a different antibiotic to take for the infection. She should stop the Keflex.

## 2020-02-22 NOTE — Telephone Encounter (Signed)
Aware. 

## 2020-02-22 NOTE — Telephone Encounter (Signed)
  Prescription Request  02/22/2020  What is the name of the medication or equipment? hydrochlorothiazide (HYDRODIURIL) 25 MG tablet  Have you contacted your pharmacy to request a refill? (if applicable) it was supposed to have been sent in with other meds the other day.  Which pharmacy would you like this sent to? [facility]   Patient notified that their request is being sent to the clinical staff for review and that they should receive a response within 2 business days.

## 2020-02-23 ENCOUNTER — Encounter: Payer: Self-pay | Admitting: Family Medicine

## 2020-03-01 ENCOUNTER — Emergency Department (HOSPITAL_COMMUNITY): Payer: Medicare Other

## 2020-03-01 ENCOUNTER — Other Ambulatory Visit: Payer: Self-pay

## 2020-03-01 ENCOUNTER — Emergency Department (HOSPITAL_COMMUNITY)
Admission: EM | Admit: 2020-03-01 | Discharge: 2020-03-01 | Disposition: A | Discharge status: Home / Self Care | Payer: Medicare Other | Attending: Emergency Medicine | Admitting: Emergency Medicine

## 2020-03-01 ENCOUNTER — Encounter (HOSPITAL_COMMUNITY): Payer: Self-pay

## 2020-03-01 DIAGNOSIS — R0789 Other chest pain: Secondary | ICD-10-CM

## 2020-03-01 DIAGNOSIS — I10 Essential (primary) hypertension: Secondary | ICD-10-CM | POA: Insufficient documentation

## 2020-03-01 DIAGNOSIS — R079 Chest pain, unspecified: Secondary | ICD-10-CM | POA: Diagnosis not present

## 2020-03-01 DIAGNOSIS — R112 Nausea with vomiting, unspecified: Secondary | ICD-10-CM | POA: Diagnosis not present

## 2020-03-01 DIAGNOSIS — I480 Paroxysmal atrial fibrillation: Secondary | ICD-10-CM | POA: Insufficient documentation

## 2020-03-01 DIAGNOSIS — Z79899 Other long term (current) drug therapy: Secondary | ICD-10-CM | POA: Insufficient documentation

## 2020-03-01 DIAGNOSIS — J449 Chronic obstructive pulmonary disease, unspecified: Secondary | ICD-10-CM | POA: Diagnosis not present

## 2020-03-01 DIAGNOSIS — R52 Pain, unspecified: Secondary | ICD-10-CM | POA: Diagnosis not present

## 2020-03-01 DIAGNOSIS — Z7901 Long term (current) use of anticoagulants: Secondary | ICD-10-CM | POA: Insufficient documentation

## 2020-03-01 DIAGNOSIS — R0689 Other abnormalities of breathing: Secondary | ICD-10-CM | POA: Diagnosis not present

## 2020-03-01 DIAGNOSIS — I4891 Unspecified atrial fibrillation: Secondary | ICD-10-CM | POA: Diagnosis not present

## 2020-03-01 LAB — CBC WITH DIFFERENTIAL/PLATELET
Abs Immature Granulocytes: 0.02 10*3/uL (ref 0.00–0.07)
Basophils Absolute: 0 10*3/uL (ref 0.0–0.1)
Basophils Relative: 1 %
Eosinophils Absolute: 0.5 10*3/uL (ref 0.0–0.5)
Eosinophils Relative: 5 %
HCT: 40.7 % (ref 36.0–46.0)
Hemoglobin: 12 g/dL (ref 12.0–15.0)
Immature Granulocytes: 0 %
Lymphocytes Relative: 27 %
Lymphs Abs: 2.4 10*3/uL (ref 0.7–4.0)
MCH: 30.5 pg (ref 26.0–34.0)
MCHC: 29.5 g/dL — ABNORMAL LOW (ref 30.0–36.0)
MCV: 103.6 fL — ABNORMAL HIGH (ref 80.0–100.0)
Monocytes Absolute: 0.9 10*3/uL (ref 0.1–1.0)
Monocytes Relative: 11 %
Neutro Abs: 4.8 10*3/uL (ref 1.7–7.7)
Neutrophils Relative %: 56 %
Platelets: 221 10*3/uL (ref 150–400)
RBC: 3.93 MIL/uL (ref 3.87–5.11)
RDW: 12 % (ref 11.5–15.5)
WBC: 8.6 10*3/uL (ref 4.0–10.5)
nRBC: 0 % (ref 0.0–0.2)

## 2020-03-01 LAB — COMPREHENSIVE METABOLIC PANEL
ALT: 10 U/L (ref 0–44)
AST: 12 U/L — ABNORMAL LOW (ref 15–41)
Albumin: 2.7 g/dL — ABNORMAL LOW (ref 3.5–5.0)
Alkaline Phosphatase: 80 U/L (ref 38–126)
Anion gap: 7 (ref 5–15)
BUN: 12 mg/dL (ref 8–23)
CO2: 30 mmol/L (ref 22–32)
Calcium: 8.4 mg/dL — ABNORMAL LOW (ref 8.9–10.3)
Chloride: 102 mmol/L (ref 98–111)
Creatinine, Ser: 1.12 mg/dL — ABNORMAL HIGH (ref 0.44–1.00)
GFR calc Af Amer: 52 mL/min — ABNORMAL LOW (ref >60)
GFR calc non Af Amer: 45 mL/min — ABNORMAL LOW (ref >60)
Glucose, Bld: 134 mg/dL — ABNORMAL HIGH (ref 70–99)
Potassium: 3.6 mmol/L (ref 3.5–5.1)
Sodium: 139 mmol/L (ref 135–145)
Total Bilirubin: 0.5 mg/dL (ref 0.3–1.2)
Total Protein: 6.7 g/dL (ref 6.5–8.1)

## 2020-03-01 LAB — LIPASE, BLOOD: Lipase: 26 U/L (ref 11–51)

## 2020-03-01 LAB — TROPONIN I (HIGH SENSITIVITY)
Troponin I (High Sensitivity): 6 ng/L (ref <18)
Troponin I (High Sensitivity): 7 ng/L (ref <18)

## 2020-03-01 MED ORDER — METOPROLOL TARTRATE 5 MG/5ML IV SOLN
5.0000 mg | Freq: Once | INTRAVENOUS | Status: AC
  Administered 2020-03-01: 23:00:00 5 mg via INTRAVENOUS
  Filled 2020-03-01: qty 5

## 2020-03-01 MED ORDER — ALUM & MAG HYDROXIDE-SIMETH 200-200-20 MG/5ML PO SUSP
30.0000 mL | Freq: Once | ORAL | Status: AC
  Administered 2020-03-01: 20:00:00 30 mL via ORAL
  Filled 2020-03-01: qty 30

## 2020-03-01 MED ORDER — ONDANSETRON 4 MG PO TBDP: 4.0000 mg | ORAL_TABLET | Freq: Three times a day (TID) | ORAL | 0 refills | Status: DC | PRN

## 2020-03-01 NOTE — ED Notes (Signed)
Nurse observed pt HR had changed from previous EKG- current rhythm A-Fib with rates in 130's- pt HR is fluctuating and going in and out of A-fib-Dr Bernette Mayers made aware.

## 2020-03-01 NOTE — ED Notes (Signed)
Courtney Berry at bedside for lab draw

## 2020-03-01 NOTE — ED Triage Notes (Signed)
Pt from home, in by [facility] for chest pain that started this morning.  Pt has a noted chronic cough that she states has not changed.   Pt had one ntg per ems without change.  Pt denies sob that is worse than her "Normal"

## 2020-03-01 NOTE — ED Provider Notes (Signed)
[facility] EMERGENCY DEPARTMENT Provider Note  CSN: [id number] Arrival date & time: 03/01/20 1906    History Chief Complaint  Patient presents with  . Chest Pain    HPI  Courtney Berry is a 86 y.o. female with history of COPD and Afib presents to the emergency department for evaluation of chest pain.  She reports that this morning she had several episodes of emesis, did not have chest pain then.  Her vomiting stopped for several hours but returned this afternoon and then was associated with a moderate aching and midsternal chest pain.  Not radiating to her back and not associated with any worsening of her chronic shortness of breath or fever.  She did not have any pain radiating to her arm and nitroglycerin was ineffective.  Daughter at the bedside reports she has been taking antibiotic for cellulitis of her lower extremities, where she has chronic lymphedema.  She denies any diarrhea.   Past Medical History:  Diagnosis Date  . COPD (chronic obstructive pulmonary disease) ([place])   . Diverticulosis   . Essential hypertension   . History of chest pain    Low risk Cardiolite 2010  . Lymphedema   . Mixed hyperlipidemia   . PAF (paroxysmal atrial fibrillation) ([place])   . Rheumatoid arthritis [facility])     Past Surgical History:  Procedure Laterality Date  . TOTAL ABDOMINAL HYSTERECTOMY  1969    Family History  Problem Relation Age of Onset  . Colon cancer Father   . Cancer Mother   . Heart disease Sister        CABG age 83  . Cancer Brother     Social History   Tobacco Use  . Smoking status: Never Smoker  . Smokeless tobacco: Never Used  . Tobacco comment: Tobacco use-no  Substance Use Topics  . Alcohol use: No    Alcohol/week: 0.0 standard drinks  . Drug use: No     Home Medications Prior to Admission medications   Medication Sig Start Date End Date Taking? Authorizing Provider  acetaminophen (TYLENOL) 500 MG tablet Take 1,000 mg by mouth every 6 (six) hours as  needed for moderate pain.   Yes [provider]  apixaban (ELIQUIS) 5 MG TABS tablet Take 1 tablet (5 mg total) by mouth 2 (two) times daily. 01/24/20  Yes Satira Sark, MD  Bismuth Tribromoph-Petrolatum (XEROFORM PETROLAT GAUZE 5"X9") MISC Apply 1 application topically daily. (both legs) 12/09/19  Yes Loman Brooklyn, FNP  clindamycin (CLEOCIN) 300 MG capsule Take 1 capsule (300 mg total) by mouth 4 (four) times daily for 10 days. 02/22/20 03/03/20 Yes Loman Brooklyn, FNP  fluticasone (FLONASE) 50 MCG/ACT nasal spray Place 2 sprays into both nostrils daily. Patient taking differently: Place 2 sprays into both nostrils as needed.  04/25/19  Yes Rakes, Connye Burkitt, FNP  gabapentin (NEURONTIN) 100 MG capsule Take 1 capsule (100 mg total) by mouth at bedtime. 02/20/20  Yes Hendricks Limes F, FNP  hydrochlorothiazide (HYDRODIURIL) 25 MG tablet Take 1 tablet (25 mg total) by mouth daily. 02/22/20  Yes Hendricks Limes F, FNP  lansoprazole (PREVACID) 15 MG capsule Take 15 mg by mouth as needed.   Yes [provider]  lisinopril (ZESTRIL) 20 MG tablet TAKE ONE (1) TABLET EACH DAY 09/12/19  Yes Satira Sark, MD  metoprolol succinate (TOPROL-XL) 25 MG 24 hr tablet TAKE 1 AND 1/2 TABLET TWICE A DAY 09/12/19  Yes Satira Sark, MD  ondansetron (ZOFRAN) 4 MG  tablet Take 1 tablet (4 mg total) by mouth 3 (three) times daily before meals. 11/22/19  Yes Anice Paganini, NP  PROAIR HFA 108 (90 Base) MCG/ACT inhaler Inhale 1-2 puffs into the lungs every 6 (six) hours as needed for wheezing or shortness of breath. 09/22/19  Yes Remus Loffler, PA-C  promethazine (PHENERGAN) 12.5 MG tablet Take 1 tablet (12.5 mg total) by mouth every 8 (eight) hours as needed for nausea or vomiting. 10/11/19  Yes Remus Loffler, PA-C  umeclidinium-vilanterol (ANORO ELLIPTA) 62.5-25 MCG/INH AEPB Inhale 1 puff into the lungs as needed.    Yes [provider]  benzonatate (TESSALON) 200 MG capsule TAKE 1 CAPSULE TWICE  DAILY AS NEEDED FOR COUGH Patient not taking: Reported on 03/01/2020 06/07/19   Remus Loffler, PA-C  ondansetron (ZOFRAN ODT) 4 MG disintegrating tablet Take 1 tablet (4 mg total) by mouth every 8 (eight) hours as needed for nausea or vomiting. 03/01/20   Pollyann Savoy, MD  predniSONE (STERAPRED UNI-PAK 21 TAB) 10 MG (21) TBPK tablet As directed x 6 days Patient not taking: Reported on 03/01/2020 02/20/20   Gwenlyn Fudge, FNP     Allergies    Darvocet [propoxyphene n-acetaminophen], Doxycycline, Nitrofurantoin monohyd macro, Omnicef [cefdinir], Plavix [clopidogrel bisulfate], Sulfa antibiotics, and Penicillins   Review of Systems   Review of Systems  Constitutional: Negative for fever.  HENT: Negative for congestion and sore throat.   Respiratory: Negative for cough and shortness of breath.   Cardiovascular: Positive for chest pain.  Gastrointestinal: Positive for nausea and vomiting. Negative for abdominal pain and diarrhea.  Genitourinary: Negative for dysuria.  Musculoskeletal: Negative for myalgias.  Skin: Negative for rash.  Neurological: Negative for headaches.  Psychiatric/Behavioral: Negative for behavioral problems.     Physical Exam BP 135/83   Pulse 60   Temp 98.2 F (36.8 C) (Oral)   Resp 20   Ht 5\' 6"  (1.676 m)   Wt 94.3 kg   SpO2 99%   BMI 33.57 kg/m   Physical Exam Vitals and nursing note reviewed.  Constitutional:      Appearance: Normal appearance.  HENT:     Head: Normocephalic and atraumatic.     Nose: Nose normal.     Mouth/Throat:     Mouth: Mucous membranes are moist.  Eyes:     Extraocular Movements: Extraocular movements intact.     Conjunctiva/sclera: Conjunctivae normal.  Cardiovascular:     Rate and Rhythm: Normal rate.  Pulmonary:     Effort: Pulmonary effort is normal.     Breath sounds: Normal breath sounds.  Chest:     Chest wall: No tenderness.  Abdominal:     General: Abdomen is flat.     Palpations: Abdomen is soft.      Tenderness: There is no abdominal tenderness.  Musculoskeletal:        General: No swelling. Normal range of motion.     Cervical back: Neck supple.  Skin:    General: Skin is warm and dry.  Neurological:     General: No focal deficit present.     Mental Status: She is alert.  Psychiatric:        Mood and Affect: Mood normal.      ED Results / Procedures / Treatments   Labs (all labs ordered are listed, but only abnormal results are displayed) Labs Reviewed  CBC WITH DIFFERENTIAL/PLATELET - Abnormal; Notable for the following components:      Result Value  MCV 103.6 (*)    MCHC 29.5 (*)    All other components within normal limits  COMPREHENSIVE METABOLIC PANEL - Abnormal; Notable for the following components:   Glucose, Bld 134 (*)    Creatinine, Ser 1.12 (*)    Calcium 8.4 (*)    Albumin 2.7 (*)    AST 12 (*)    GFR calc non Af Amer 45 (*)    GFR calc Af Amer 52 (*)    All other components within normal limits  LIPASE, BLOOD  TROPONIN I (HIGH SENSITIVITY)  TROPONIN I (HIGH SENSITIVITY)    EKG EKG Interpretation  Date/Time:  Thursday March 01 2020 21:14:16 EDT Ventricular Rate:  137 PR Interval:    QRS Duration: 70 QT Interval:  327 QTC Calculation: 494 R Axis:   11 Text Interpretation: Atrial fibrillation Low voltage, precordial leads Nonspecific T abnormalities, lateral leads Borderline prolonged QT interval Since last tracing Atrial fibrillation has replaced Sinus rhythm Confirmed by Susy Frizzle [phone]) on 03/01/2020 9:27:46 PM   Radiology DG Chest 2 View  Result Date: 03/01/2020 CLINICAL DATA:  Chest pain EXAM: CHEST - 2 VIEW COMPARISON:  Radiograph 10/18/2019 FINDINGS: No consolidation, features of edema, pneumothorax, or effusion. Stable borderline cardiomegaly. The aorta is calcified. The remaining cardiomediastinal contours are unremarkable. No acute osseous or soft tissue abnormality. Degenerative changes are present in the imaged spine and  shoulders. Telemetry leads overlie the chest. IMPRESSION: No acute cardiopulmonary abnormality. Electronically Signed   By: Kreg Shropshire M.D.   On: 03/01/2020 20:53    Procedures Procedures  Medications Ordered in the ED Medications  metoprolol tartrate (LOPRESSOR) injection 5 mg (has no administration in time range)  alum & mag hydroxide-simeth (MAALOX/MYLANTA) 200-200-20 MG/5ML suspension 30 mL (30 mLs Oral Given 03/01/20 2026)     ED Course  I have reviewed the triage vital signs and the nursing notes.  Pertinent labs & imaging results that were available during my care of the patient were reviewed by me and considered in my medical decision making (see chart for details).  Clinical Course as of Mar 01 2306  Thu Mar 01, 2020  2034 CBC is unremarkable.   [CS]  2126 CMP shows hypoalbuminemia, Lipase and Trop are normal. Patient is in and out of afib, rate is fast when she is in afib but controlled in SR.    [CS]  2206 Patient feeling much better, chest pain is resolved. She remains in afib, rate is variable from 100s-130s. Will give a dose of Lopressor 5mg  for rate control while awaiting her second troponin. Patient would like to go home if second Trop remains neg.    [CS]  2303 Patient remains asymptomatic and ready to go home. HR remains variable, but she is asymptomatic and is comfortable taking her meds at home. Zofran as needed for nausea and return as needed if symptoms worsen.    [CS]    Clinical Course User Index [CS] 2304, MD    MDM Rules/Calculators/A&P MDM Number of Diagnoses or Management Options Diagnosis management comments: Patient with significant past medical history here for evaluation of chest pain onset after vomiting.  Her pain and nausea are resolved at the time of my evaluation.  Her initial EKG showed A. fib but her second is now normal sinus rhythm.  We will check a CBC, metabolic panel with lipase and urinalysis as well as troponins and a  chest x-ray to evaluate her above complaints.    Amount and/or Complexity  of Data Reviewed Clinical lab tests: ordered and reviewed Tests in the radiology section of CPT: ordered and reviewed Decide to obtain previous medical records or to obtain history from someone other than the patient: yes Obtain history from someone other than the patient: yes (Daughter) Review and summarize past medical records: yes Independent visualization of images, tracings, or specimens: yes  Risk of Complications, Morbidity, and/or Mortality Presenting problems: high Diagnostic procedures: high Management options: high    Final Clinical Impression(s) / ED Diagnoses Final diagnoses:  Atypical chest pain  Non-intractable vomiting with nausea, unspecified vomiting type    Rx / DC Orders ED Discharge Orders         Ordered    ondansetron (ZOFRAN ODT) 4 MG disintegrating tablet  Every 8 hours PRN     03/01/20 2306           Pollyann Savoy, MD 03/01/20 2307

## 2020-03-05 ENCOUNTER — Telehealth: Payer: Self-pay | Admitting: Family Medicine

## 2020-03-05 NOTE — Telephone Encounter (Signed)
  Incoming Patient Call  03/05/2020  What symptoms do you have? Burning when she urinates/UTI?  How long have you been sick? Since Saturday  Have you been seen for this problem? no  If your provider decides to give you a prescription, which pharmacy would you like for it to be sent to? [facility]   Patient informed that this information will be sent to the clinical staff for review and that they should receive a follow up call.

## 2020-03-05 NOTE — Telephone Encounter (Signed)
Attempted to contact- line busy NTBS

## 2020-03-06 ENCOUNTER — Telehealth: Payer: Self-pay | Admitting: Family Medicine

## 2020-03-06 NOTE — Telephone Encounter (Signed)
FYI: Someone called from [facility] to let us know that patient is scheduled to see them on 03/13/20 @ 1:00.

## 2020-03-13 ENCOUNTER — Telehealth: Payer: Self-pay | Admitting: Family Medicine

## 2020-03-13 DIAGNOSIS — I890 Lymphedema, not elsewhere classified: Secondary | ICD-10-CM | POA: Diagnosis not present

## 2020-03-13 DIAGNOSIS — L03116 Cellulitis of left lower limb: Secondary | ICD-10-CM

## 2020-03-13 MED ORDER — MOXIFLOXACIN HCL 400 MG PO TABS: 400.0000 mg | ORAL_TABLET | Freq: Every day | ORAL | 0 refills | Status: AC

## 2020-03-13 NOTE — Telephone Encounter (Signed)
Pt was on the clindamycin for 3 days. Patient daughter not sure if she has ever had them before daughter STATES SHE CAN NOT TAKE A STRONG ABX. DAUGHTER STATES IT WILL HAVE TO BE LOW DOSE.

## 2020-03-13 NOTE — Telephone Encounter (Signed)
Can script be done or must she have a visit?

## 2020-03-13 NOTE — Telephone Encounter (Signed)
[facility] Please let us know if patient does not tolerate as she will need a referral at that point.

## 2020-03-13 NOTE — Telephone Encounter (Signed)
How long did she take the Clindamycin that I sent to replace the Keflex earlier this month? Has she ever taken Levaquin or Moxifloxacin? If not, we can try one of these medications. If yes and she does not tolerate them, I will have to refer to infectious disease as she does not tolerate any other medications to treat skin infections.

## 2020-03-14 NOTE — Telephone Encounter (Signed)
Courtney Berry made aware. Knows to call if ATB does not help with feet and legs or if pt does not feel well on medications. Also made aware that that a referral to possible wound care may be needed.

## 2020-03-15 NOTE — Telephone Encounter (Signed)
Taken care of in another encounter 

## 2020-03-17 ENCOUNTER — Emergency Department (HOSPITAL_COMMUNITY)
Admission: EM | Admit: 2020-03-17 | Discharge: 2020-03-17 | Disposition: A | Discharge status: Home / Self Care | Payer: Medicare Other | Attending: Emergency Medicine | Admitting: Emergency Medicine

## 2020-03-17 ENCOUNTER — Other Ambulatory Visit: Payer: Self-pay

## 2020-03-17 ENCOUNTER — Encounter (HOSPITAL_COMMUNITY): Payer: Self-pay | Admitting: Emergency Medicine

## 2020-03-17 DIAGNOSIS — Z7901 Long term (current) use of anticoagulants: Secondary | ICD-10-CM | POA: Diagnosis not present

## 2020-03-17 DIAGNOSIS — I890 Lymphedema, not elsewhere classified: Secondary | ICD-10-CM | POA: Insufficient documentation

## 2020-03-17 DIAGNOSIS — R2243 Localized swelling, mass and lump, lower limb, bilateral: Secondary | ICD-10-CM | POA: Diagnosis present

## 2020-03-17 DIAGNOSIS — R52 Pain, unspecified: Secondary | ICD-10-CM | POA: Diagnosis not present

## 2020-03-17 DIAGNOSIS — Z79899 Other long term (current) drug therapy: Secondary | ICD-10-CM | POA: Diagnosis not present

## 2020-03-17 DIAGNOSIS — I10 Essential (primary) hypertension: Secondary | ICD-10-CM | POA: Insufficient documentation

## 2020-03-17 DIAGNOSIS — I4891 Unspecified atrial fibrillation: Secondary | ICD-10-CM | POA: Insufficient documentation

## 2020-03-17 DIAGNOSIS — I872 Venous insufficiency (chronic) (peripheral): Secondary | ICD-10-CM | POA: Diagnosis not present

## 2020-03-17 DIAGNOSIS — I959 Hypotension, unspecified: Secondary | ICD-10-CM | POA: Diagnosis not present

## 2020-03-17 DIAGNOSIS — J449 Chronic obstructive pulmonary disease, unspecified: Secondary | ICD-10-CM | POA: Diagnosis not present

## 2020-03-17 DIAGNOSIS — L0390 Cellulitis, unspecified: Secondary | ICD-10-CM | POA: Diagnosis not present

## 2020-03-17 DIAGNOSIS — R531 Weakness: Secondary | ICD-10-CM | POA: Diagnosis not present

## 2020-03-17 LAB — COMPREHENSIVE METABOLIC PANEL
ALT: 9 U/L (ref 0–44)
AST: 18 U/L (ref 15–41)
Albumin: 2.6 g/dL — ABNORMAL LOW (ref 3.5–5.0)
Alkaline Phosphatase: 95 U/L (ref 38–126)
Anion gap: 12 (ref 5–15)
BUN: 22 mg/dL (ref 8–23)
CO2: 27 mmol/L (ref 22–32)
Calcium: 8.1 mg/dL — ABNORMAL LOW (ref 8.9–10.3)
Chloride: 101 mmol/L (ref 98–111)
Creatinine, Ser: 1.68 mg/dL — ABNORMAL HIGH (ref 0.44–1.00)
GFR calc Af Amer: 32 mL/min — ABNORMAL LOW (ref >60)
GFR calc non Af Amer: 27 mL/min — ABNORMAL LOW (ref >60)
Glucose, Bld: 98 mg/dL (ref 70–99)
Potassium: 3.7 mmol/L (ref 3.5–5.1)
Sodium: 140 mmol/L (ref 135–145)
Total Bilirubin: 0.9 mg/dL (ref 0.3–1.2)
Total Protein: 6.6 g/dL (ref 6.5–8.1)

## 2020-03-17 LAB — URINALYSIS, ROUTINE W REFLEX MICROSCOPIC
Bacteria, UA: NONE SEEN
Bilirubin Urine: NEGATIVE
Glucose, UA: NEGATIVE mg/dL
Ketones, ur: NEGATIVE mg/dL
Leukocytes,Ua: NEGATIVE
Nitrite: NEGATIVE
Protein, ur: NEGATIVE mg/dL
Specific Gravity, Urine: 1.017 (ref 1.005–1.030)
pH: 5 (ref 5.0–8.0)

## 2020-03-17 LAB — CBC WITH DIFFERENTIAL/PLATELET
Abs Immature Granulocytes: 0.02 10*3/uL (ref 0.00–0.07)
Basophils Absolute: 0 10*3/uL (ref 0.0–0.1)
Basophils Relative: 1 %
Eosinophils Absolute: 0.6 10*3/uL — ABNORMAL HIGH (ref 0.0–0.5)
Eosinophils Relative: 7 %
HCT: 40.8 % (ref 36.0–46.0)
Hemoglobin: 12 g/dL (ref 12.0–15.0)
Immature Granulocytes: 0 %
Lymphocytes Relative: 32 %
Lymphs Abs: 2.9 10*3/uL (ref 0.7–4.0)
MCH: 30.5 pg (ref 26.0–34.0)
MCHC: 29.4 g/dL — ABNORMAL LOW (ref 30.0–36.0)
MCV: 103.8 fL — ABNORMAL HIGH (ref 80.0–100.0)
Monocytes Absolute: 0.9 10*3/uL (ref 0.1–1.0)
Monocytes Relative: 10 %
Neutro Abs: 4.5 10*3/uL (ref 1.7–7.7)
Neutrophils Relative %: 50 %
Platelets: 210 10*3/uL (ref 150–400)
RBC: 3.93 MIL/uL (ref 3.87–5.11)
RDW: 12.5 % (ref 11.5–15.5)
WBC: 8.9 10*3/uL (ref 4.0–10.5)
nRBC: 0 % (ref 0.0–0.2)

## 2020-03-17 MED ORDER — SODIUM CHLORIDE 0.9 % IV BOLUS
1000.0000 mL | Freq: Once | INTRAVENOUS | Status: AC
  Administered 2020-03-17: 1000 mL via INTRAVENOUS

## 2020-03-17 NOTE — ED Provider Notes (Addendum)
[facility] EMERGENCY DEPARTMENT Provider Note   CSN: [id number] Arrival date & time: 03/17/20  1504     History Chief Complaint  Patient presents with  . Leg Pain    Courtney Berry is a 86 y.o. female with PMH COPD, HLD, HTN, PAF on Eliquis, and lymphedema who comes to the ED via EMS with complaints of worsening cellulitis of bilateral extremities not improved with current antibiotic regimen.  She is accompanied by her daughter who reports that she has been experiencing lower extremity swelling and redness for approximately 1 year.  Patient reports that she has been prescribed Keflex, ciprofloxacin, and multiple other antibiotics which have failed to improve her symptoms.  Her primary care provider wanted her to go to the ED for laboratory work-up to assess for systemic infection.  She is followed by a lymphedema clinic however they were unable to attend to her given concern for infection.   She sleeps in a recliner with her legs elevated, however that feels improve her symptoms.  She also states that she cannot wear compression stockings and that her Unna boots that had been prescribed "get too hot" and are intolerable despite wearing pants and long socks.  They are unsure whether or not it is a device defect and are in the process of remedying that.  In addition to her bilateral leg edema, redness, and warmth, she is also endorsing significant amounts of loose, nonbloody nonmelanotic stool.  She has already had 4-5 episodes of loose stools today.  She is also endorsing dysuria and feels consistent with her history of UTIs.  She does not have any significant pain or discomfort except with ambulation.  She denies any fevers or chills, chest pain or difficulty breathing, drainage, weakness or numbness, diminished range of motion, or other symptoms.  HPI     Past Medical History:  Diagnosis Date  . COPD (chronic obstructive pulmonary disease) (HCC)   . Diverticulosis   . Essential  hypertension   . History of chest pain    Low risk Cardiolite 2010  . Lymphedema   . Mixed hyperlipidemia   . PAF (paroxysmal atrial fibrillation) (HCC)   . Rheumatoid arthritis [facility])     Patient Active Problem List   Diagnosis Date Noted  . Nausea without vomiting 11/22/2019  . Weight loss, unintentional 11/22/2019  . Poor appetite 11/22/2019  . Elevated bilirubin 11/22/2019  . Neuropathy 03/02/2019  . PAF (paroxysmal atrial fibrillation) (HCC) 08/01/2018  . COPD (chronic obstructive pulmonary disease) (HCC) 08/01/2018  . Lymphedema 08/31/2017  . Lymphedema of both lower extremities 02/09/2017  . Hypotension due to drugs 11/24/2016  . Pain in both lower legs 11/24/2016  . Rheumatoid arthritis (HCC) 10/20/2016  . Peripheral vascular disease (HCC) 10/20/2016  . Atrial fibrillation with RVR (HCC) 08/01/2016  . Bradycardia 08/01/2016  . Chronic anticoagulation 02/14/2016  . Palpitations 03/02/2012  . Dyslipidemia 09/03/2009  . Essential hypertension 09/03/2009  . Shortness of breath 09/03/2009    Past Surgical History:  Procedure Laterality Date  . TOTAL ABDOMINAL HYSTERECTOMY  1969     OB History   No obstetric history on file.     Family History  Problem Relation Age of Onset  . Colon cancer Father   . Cancer Mother   . Heart disease Sister        CABG age 74  . Cancer Brother     Social History   Tobacco Use  . Smoking status: Never Smoker  . Smokeless tobacco: Never  Used  . Tobacco comment: Tobacco use-no  Substance Use Topics  . Alcohol use: No    Alcohol/week: 0.0 standard drinks  . Drug use: No    Home Medications Prior to Admission medications   Medication Sig Start Date End Date Taking? Authorizing Provider  acetaminophen (TYLENOL) 500 MG tablet Take 1,000 mg by mouth every 6 (six) hours as needed for moderate pain.   Yes [provider]  apixaban (ELIQUIS) 5 MG TABS tablet Take 1 tablet (5 mg total) by mouth 2 (two) times daily.  01/24/20  Yes Jonelle Sidle, MD  Bismuth Tribromoph-Petrolatum (XEROFORM PETROLAT GAUZE 5"X9") MISC Apply 1 application topically daily. (both legs) 12/09/19  Yes Gwenlyn Fudge, FNP  fluticasone (FLONASE) 50 MCG/ACT nasal spray Place 2 sprays into both nostrils daily. Patient taking differently: Place 2 sprays into both nostrils as needed.  04/25/19  Yes Rakes, Doralee Albino, FNP  gabapentin (NEURONTIN) 100 MG capsule Take 1 capsule (100 mg total) by mouth at bedtime. 02/20/20  Yes Deliah Boston F, FNP  hydrochlorothiazide (HYDRODIURIL) 25 MG tablet Take 1 tablet (25 mg total) by mouth daily. 02/22/20  Yes Deliah Boston F, FNP  lansoprazole (PREVACID) 15 MG capsule Take 15 mg by mouth as needed.   Yes [provider]  lisinopril (ZESTRIL) 20 MG tablet TAKE ONE (1) TABLET EACH DAY 09/12/19  Yes Jonelle Sidle, MD  metoprolol succinate (TOPROL-XL) 25 MG 24 hr tablet TAKE 1 AND 1/2 TABLET TWICE A DAY 09/12/19  Yes Jonelle Sidle, MD  moxifloxacin (AVELOX) 400 MG tablet Take 1 tablet (400 mg total) by mouth daily at 8 pm for 10 days. 03/13/20 03/23/20 Yes Gwenlyn Fudge, FNP  ondansetron (ZOFRAN ODT) 4 MG disintegrating tablet Take 1 tablet (4 mg total) by mouth every 8 (eight) hours as needed for nausea or vomiting. 03/01/20  Yes Pollyann Savoy, MD  PROAIR HFA 108 [phone] Base) MCG/ACT inhaler Inhale 1-2 puffs into the lungs every 6 (six) hours as needed for wheezing or shortness of breath. 09/22/19  Yes Remus Loffler, PA-C  umeclidinium-vilanterol (ANORO ELLIPTA) 62.5-25 MCG/INH AEPB Inhale 1 puff into the lungs as needed.    Yes [provider]  benzonatate (TESSALON) 200 MG capsule TAKE 1 CAPSULE TWICE DAILY AS NEEDED FOR COUGH Patient not taking: Reported on 03/01/2020 06/07/19   Remus Loffler, PA-C  ondansetron (ZOFRAN) 4 MG tablet Take 1 tablet (4 mg total) by mouth 3 (three) times daily before meals. 11/22/19   Anice Paganini, NP  predniSONE (STERAPRED UNI-PAK 21 TAB) 10 MG (21)  TBPK tablet As directed x 6 days Patient not taking: Reported on 03/01/2020 02/20/20   Gwenlyn Fudge, FNP  promethazine (PHENERGAN) 12.5 MG tablet Take 1 tablet (12.5 mg total) by mouth every 8 (eight) hours as needed for nausea or vomiting. 10/11/19   Remus Loffler, PA-C    Allergies    Darvocet [propoxyphene n-acetaminophen], Doxycycline, Nitrofurantoin monohyd macro, Omnicef [cefdinir], Plavix [clopidogrel bisulfate], Sulfa antibiotics, and Penicillins  Review of Systems   Review of Systems  Constitutional: Negative for chills and fever.  Gastrointestinal: Positive for diarrhea. Negative for abdominal pain, anal bleeding and blood in stool.  Genitourinary: Positive for dysuria.  Musculoskeletal: Positive for gait problem.  Skin: Positive for color change.  Neurological: Negative for weakness and numbness.  Hematological: Bruises/bleeds easily.    Physical Exam Updated Vital Signs BP (!) 127/99 (BP Location: Right Arm)   Pulse 69   Temp 98.1  F (36.7 C) (Oral)   Resp 19   Ht 5\' 6"  (1.676 m)   Wt 126.1 kg   SpO2 95%   BMI 44.87 kg/m   Physical Exam Vitals and nursing note reviewed. Exam conducted with a chaperone present.  Constitutional:      Appearance: Normal appearance.  HENT:     Head: Normocephalic and atraumatic.  Eyes:     General: No scleral icterus.    Conjunctiva/sclera: Conjunctivae normal.  Cardiovascular:     Rate and Rhythm: Normal rate and regular rhythm.     Pulses: Normal pulses.     Heart sounds: Normal heart sounds.  Pulmonary:     Effort: Pulmonary effort is normal. No respiratory distress.     Breath sounds: Normal breath sounds.  Musculoskeletal:     Cervical back: Normal range of motion and neck supple. No rigidity.     Comments: Lower legs: Significant swelling, erythema, and warmth bilaterally.  Moderate pitting edema.  No weeping.  Pedal pulses intact bilaterally.  Able to demonstrate full ROM.  Sensation intact throughout.  Skin:     General: Skin is dry.     Capillary Refill: Capillary refill takes less than 2 seconds.  Neurological:     Mental Status: She is alert.     GCS: GCS eye subscore is 4. GCS verbal subscore is 5. GCS motor subscore is 6.  Psychiatric:        Mood and Affect: Mood normal.        Behavior: Behavior normal.        Thought Content: Thought content normal.          ED Results / Procedures / Treatments   Labs (all labs ordered are listed, but only abnormal results are displayed) Labs Reviewed  CBC WITH DIFFERENTIAL/PLATELET - Abnormal; Notable for the following components:      Result Value   MCV 103.8 (*)    MCHC 29.4 (*)    Eosinophils Absolute 0.6 (*)    All other components within normal limits  URINALYSIS, ROUTINE W REFLEX MICROSCOPIC - Abnormal; Notable for the following components:   Hgb urine dipstick MODERATE (*)    All other components within normal limits  COMPREHENSIVE METABOLIC PANEL - Abnormal; Notable for the following components:   Creatinine, Ser 1.68 (*)    Calcium 8.1 (*)    Albumin 2.6 (*)    GFR calc non Af Amer 27 (*)    GFR calc Af Amer 32 (*)    All other components within normal limits  GASTROINTESTINAL PANEL BY PCR, STOOL (REPLACES STOOL CULTURE)  C DIFFICILE QUICK SCREEN W PCR REFLEX    EKG None  Radiology No results found.  Procedures Procedures (including critical care time)  Medications Ordered in ED Medications  sodium chloride 0.9 % bolus 1,000 mL (0 mLs Intravenous Stopped 03/17/20 1847)    ED Course  I have reviewed the triage vital signs and the nursing notes.  Pertinent labs & imaging results that were available during my care of the patient were reviewed by me and considered in my medical decision making (see chart for details).    MDM Rules/Calculators/A&P                      Patient's laboratory work-up is notable for slightly worsening renal function, suspect prerenal azotemia due to patient's reported loose stools and  diminished hydration.  Daughter suspected dehydration.  Replenished with 1 L NS.  Patient will  need to have her labs rechecked in 3 to 4 days by her primary care provider to ensure correction.  No leukocytosis concerning for systemic infection.  Patient has been afebrile here in the ED and she denies any fevers or chills at home.  She denies any significant discomfort with her legs except with ambulation.  This is consistent with lymphedema and venous stasis dermatitis.  Her legs appear symmetric.  Patient to see the lymphedema specialist on 03/20/2020.  Encouraging patient to continue using her Unna boots, as directed.  Suspect that her swollen, red, warm extremities are more suggestive of lymphedema rather than a cellulitis process.  Do not feel as though patient requires admission for IV antibiotics.  She also endorses some urinary symptoms, however had been treated with appropriate antibiotics already.  Her UA was interpreted and does not demonstrate evidence of infection.  I initially plan to obtain C. difficile quick screen by PCR reflex as well as a gastrointestinal panel by PCR.  However, despite having numerous frequent bowel movements at home, she did not have one during her several hour stay here in the ED.  Advised her to follow-up with her primary care provider to determine whether or not stool testing warranted.  Dr. Jacqulyn Bath personally evaluated patient and agrees with assessment and plan.  Plan has excellent outpatient follow-up.  Strict ED return precautions discussed.  All of the evaluation and work-up results were discussed with the patient and any family at bedside. They were provided opportunity to ask any additional questions and have none at this time. They have expressed understanding of verbal discharge instructions as well as return precautions and are agreeable to the plan.   Her daughter was present for entirety of assessment and plan.  Final Clinical Impression(s) / ED Diagnoses Final  diagnoses:  Lymphedema  Venous stasis dermatitis of both lower extremities    Rx / DC Orders ED Discharge Orders    None       Lorelee New, PA-C 03/17/20 2014    Lorelee New, PA-C 03/17/20 2016    Vanetta Mulders, MD 03/18/20 [phone]

## 2020-03-17 NOTE — ED Triage Notes (Signed)
Pt on abx for cellulitis for bilateral lower legs but is not getting any better.

## 2020-03-17 NOTE — Discharge Instructions (Addendum)
Please go to your lymphedema specialist next week, as scheduled.  Please continue using your Unna boots, as directed.  Please call your primary care provider regarding today's encounter.  You may continue your antibiotics, as prescribed.  However, suspect that this is lymphedema in setting of venous stasis dermatitis rather than a bacterial infection.  You will need to have your labs rechecked given your worsening kidney function in today's labs.  This is likely due to dehydration in setting of your loose stools.  You may wish to discuss stool sample outpatient to test for infectious process such as C. difficile.  Please return to the ED or seek immediate medical attention should experience any new or worsening symptoms.

## 2020-03-17 NOTE — ED Notes (Signed)
Cath completed. Pt instructed to call when she is ready to use the bedpan

## 2020-03-22 ENCOUNTER — Ambulatory Visit: Payer: Medicare Other | Admitting: Family Medicine

## 2020-03-23 ENCOUNTER — Ambulatory Visit: Payer: Medicare Other | Admitting: Physician Assistant

## 2020-03-26 ENCOUNTER — Other Ambulatory Visit: Payer: Self-pay

## 2020-03-26 DIAGNOSIS — R110 Nausea: Secondary | ICD-10-CM

## 2020-03-26 NOTE — Progress Notes (Signed)
Assessment & Plan:  1. Essential hypertension - Well controlled on current regimen.   2. Neuropathy - Uncontrolled.  Gabapentin discontinued.  Rx'd amitriptyline 10 mg at bedtime. - amitriptyline (ELAVIL) 10 MG tablet; Take 1 tablet (10 mg total) by mouth at bedtime.  Dispense: 30 tablet; Refill: 2 - CMP14+EGFR  3. Lymphedema of both lower extremities - Our office will call over to lymphedema clinic and get permission to start therapy.  4. Nausea and vomiting in adult - Advised to schedule follow-up with gastroenterology. - promethazine (PHENERGAN) 12.5 MG tablet; Take 1 tablet (12.5 mg total) by mouth every 8 (eight) hours as needed for nausea or vomiting.  Dispense: 30 tablet; Refill: 1  5. Stage 3a chronic kidney disease - CMP14+EGFR   Return in about 4 weeks (around 04/25/2020) for neuropathy.  Hendricks Limes, MSN, APRN, FNP-C [facility]  Subjective:    Patient ID: Courtney Berry, female    DOB: 1933-01-28, 86 y.o.   MRN: [id number]  Patient Care Team: Loman Brooklyn, FNP as PCP - General (Family Medicine) Satira Sark, MD as PCP - Cardiology (Cardiology) Sinda Du, MD as Consulting Physician (Pulmonary Disease) Daneil Dolin, MD as Consulting Physician (Gastroenterology)   Chief Complaint:  Chief Complaint  Patient presents with  . Hypertension    4 week follow up  . Lymphoma    hospital follow up- 5/1 AP.  Patient states she needs a new referral for [facility] since going to the hospital.     HPI: Courtney Berry is a 86 y.o. female presenting on 03/28/2020 for Hypertension (4 week follow up) and Lymphoma (hospital follow up- 5/1 AP.  Patient states she needs a new referral for [facility] since going to the hospital. )  Patient is accompanied by her daughter who she is okay with being present.  Patient is here for a follow-up of HTN and neuropathy. At her last visit a month ago she was advised to keep a BP log and bring it with her  today. She was also started on gabapentin 100 mg QHS for the neuropathy.   Patient has been keeping a log of her blood pressure at home although she did not bring it with her today.  She reports her average systolic is in the 751W; average diastolic is in the 25E.  Patient did try the gabapentin at bedtime for neuropathy but her daughter states it made her talk out of her head so they stopped taking it.   New complaints: Patient's daughter reports the lymphedema clinic needs permission to start lymphedema therapy due to concerns of cellulitis.  Patient also reports that she still "cannot eat" and occasionally has vomiting when she tries.  This has been going on for several months as she has seen gastroenterology due to the same symptoms on 11/22/2019.  At the time of her last appointment with gastroenterology they recommended an ultrasound of the right upper quadrant, Zofran, Ensure, labs, and to follow-up in 6 to 8 weeks.  The ultrasound has been completed.  The patient reports Phenergan works much better than Zofran for her.  She never scheduled her follow-up appointment.   Social history:  Relevant past medical, surgical, family and social history reviewed and updated as indicated. Interim medical history since our last visit reviewed.  Allergies and medications reviewed and updated.  DATA REVIEWED: CHART IN EPIC  ROS: Negative unless specifically indicated above in HPI.    Current Outpatient Medications:  .  acetaminophen (TYLENOL)  500 MG tablet, Take 1,000 mg by mouth every 6 (six) hours as needed for moderate pain., Disp: , Rfl:  .  apixaban (ELIQUIS) 5 MG TABS tablet, Take 1 tablet (5 mg total) by mouth 2 (two) times daily., Disp: 60 tablet, Rfl: 6 .  Bismuth Tribromoph-Petrolatum (XEROFORM PETROLAT GAUZE 5"X9") MISC, Apply 1 application topically daily. (both legs), Disp: 30 each, Rfl: 2 .  fluticasone (FLONASE) 50 MCG/ACT nasal spray, Place 2 sprays into both nostrils daily.  (Patient taking differently: Place 2 sprays into both nostrils as needed. ), Disp: 16 g, Rfl: 6 .  lisinopril (ZESTRIL) 20 MG tablet, TAKE ONE (1) TABLET EACH DAY, Disp: 90 tablet, Rfl: 3 .  metoprolol succinate (TOPROL-XL) 25 MG 24 hr tablet, TAKE 1 AND 1/2 TABLET TWICE A DAY, Disp: 270 tablet, Rfl: 3 .  PROAIR HFA 108 (90 Base) MCG/ACT inhaler, Inhale 1-2 puffs into the lungs every 6 (six) hours as needed for wheezing or shortness of breath., Disp: 18 g, Rfl: 0 .  umeclidinium-vilanterol (ANORO ELLIPTA) 62.5-25 MCG/INH AEPB, Inhale 1 puff into the lungs as needed. , Disp: , Rfl:  .  amitriptyline (ELAVIL) 10 MG tablet, Take 1 tablet (10 mg total) by mouth at bedtime., Disp: 30 tablet, Rfl: 2 .  promethazine (PHENERGAN) 12.5 MG tablet, Take 1 tablet (12.5 mg total) by mouth every 8 (eight) hours as needed for nausea or vomiting., Disp: 30 tablet, Rfl: 1   Allergies  Allergen Reactions  . Darvocet [Propoxyphene N-Acetaminophen] Other (See Comments)    unknown  . Doxycycline Itching  . Nitrofurantoin Monohyd Macro Other (See Comments)    unknown  . Omnicef [Cefdinir]     Sick, rash  . Plavix [Clopidogrel Bisulfate] Swelling    Face swells  . Sulfa Antibiotics Other (See Comments)    "face got puffy"  . Penicillins Rash    Has patient had a PCN reaction causing immediate rash, facial/tongue/throat swelling, SOB or lightheadedness with hypotension: Yes Has patient had a PCN reaction causing severe rash involving mucus membranes or skin necrosis: No Has patient had a PCN reaction that required hospitalization: No Has patient had a PCN reaction occurring within the last 10 years: No If all of the above answers are "NO", then may proceed with Cephalosporin use.    Past Medical History:  Diagnosis Date  . COPD (chronic obstructive pulmonary disease) ([place])   . Diverticulosis   . Essential hypertension   . History of chest pain    Low risk Cardiolite 2010  . Lymphedema   . Mixed  hyperlipidemia   . PAF (paroxysmal atrial fibrillation) ([place])   . Rheumatoid arthritis [facility])     Past Surgical History:  Procedure Laterality Date  . TOTAL ABDOMINAL HYSTERECTOMY  1969    Social History   Socioeconomic History  . Marital status: Married    Spouse name: Not on file  . Number of children: Not on file  . Years of education: Not on file  . Highest education level: Not on file  Occupational History  . Occupation: Retired  Tobacco Use  . Smoking status: Never Smoker  . Smokeless tobacco: Never Used  . Tobacco comment: Tobacco use-no  Substance and Sexual Activity  . Alcohol use: No    Alcohol/week: 0.0 standard drinks  . Drug use: No  . Sexual activity: Not on file  Other Topics Concern  . Not on file  Social History Narrative  . Not on file   Social Determinants of Health  Financial Resource Strain:   . Difficulty of Paying Living Expenses:   Food Insecurity:   . Worried About Charity fundraiser in the Last Year:   . Arboriculturist in the Last Year:   Transportation Needs:   . Film/video editor (Medical):   Marland Kitchen Lack of Transportation (Non-Medical):   Physical Activity:   . Days of Exercise per Week:   . Minutes of Exercise per Session:   Stress:   . Feeling of Stress :   Social Connections:   . Frequency of Communication with Friends and Family:   . Frequency of Social Gatherings with Friends and Family:   . Attends Religious Services:   . Active Member of Clubs or Organizations:   . Attends Archivist Meetings:   Marland Kitchen Marital Status:   Intimate Partner Violence:   . Fear of Current or Ex-Partner:   . Emotionally Abused:   Marland Kitchen Physically Abused:   . Sexually Abused:         Objective:    BP (!) 132/58   Pulse 61   Temp (!) 96.7 F (35.9 C) (Temporal)   SpO2 97%   Wt Readings from Last 3 Encounters:  03/17/20 278 lb (126.1 kg)  03/01/20 208 lb (94.3 kg)  01/09/20 232 lb (105.2 kg)    Physical Exam Vitals reviewed.    Constitutional:      General: She is not in acute distress.    Appearance: Normal appearance. She is not ill-appearing, toxic-appearing or diaphoretic.  HENT:     Head: Normocephalic and atraumatic.  Eyes:     General: No scleral icterus.       Right eye: No discharge.        Left eye: No discharge.     Conjunctiva/sclera: Conjunctivae normal.  Cardiovascular:     Rate and Rhythm: Normal rate and regular rhythm.     Heart sounds: Normal heart sounds. No murmur. No friction rub. No gallop.   Pulmonary:     Effort: Pulmonary effort is normal. No respiratory distress.     Breath sounds: Normal breath sounds. No stridor. No wheezing, rhonchi or rales.  Musculoskeletal:        General: Normal range of motion.     Cervical back: Normal range of motion.     Right lower leg: Edema present.     Left lower leg: Edema present.  Skin:    General: Skin is warm and dry.     Capillary Refill: Capillary refill takes less than 2 seconds.     Findings: Erythema (BLE due to lymphedema. No current cellulitis.) present.  Neurological:     General: No focal deficit present.     Mental Status: She is alert and oriented to person, place, and time. Mental status is at baseline.     Gait: Gait abnormal (rides in wheelchair).  Psychiatric:        Mood and Affect: Mood normal.        Behavior: Behavior normal.        Thought Content: Thought content normal.        Judgment: Judgment normal.     Lab Results  Component Value Date   TSH 1.004 08/01/2018   Lab Results  Component Value Date   WBC 8.9 03/17/2020   HGB 12.0 03/17/2020   HCT 40.8 03/17/2020   MCV 103.8 (H) 03/17/2020   PLT 210 03/17/2020   Lab Results  Component Value Date   NA 145 (  H) 03/28/2020   K 4.0 03/28/2020   CO2 27 03/28/2020   GLUCOSE 101 (H) 03/28/2020   BUN 9 03/28/2020   CREATININE 1.09 (H) 03/28/2020   BILITOT 0.5 03/28/2020   ALKPHOS 120 (H) 03/28/2020   AST 15 03/28/2020   ALT 7 03/28/2020   PROT 6.0  03/28/2020   ALBUMIN 2.3 (L) 03/28/2020   CALCIUM 8.4 (L) 03/28/2020   ANIONGAP 12 03/17/2020   Lab Results  Component Value Date   CHOL 145 09/22/2019   Lab Results  Component Value Date   HDL 40 09/22/2019   Lab Results  Component Value Date   LDLCALC 75 09/22/2019   Lab Results  Component Value Date   TRIG 178 (H) 09/22/2019   Lab Results  Component Value Date   CHOLHDL 3.6 09/22/2019   No results found for: HGBA1C

## 2020-03-28 ENCOUNTER — Other Ambulatory Visit: Payer: Self-pay

## 2020-03-28 ENCOUNTER — Encounter: Payer: Self-pay | Admitting: Family Medicine

## 2020-03-28 ENCOUNTER — Ambulatory Visit (INDEPENDENT_AMBULATORY_CARE_PROVIDER_SITE_OTHER): Payer: Medicare Other | Admitting: Family Medicine

## 2020-03-28 VITALS — BP 132/58 | HR 61 | Temp 96.7°F

## 2020-03-28 DIAGNOSIS — I890 Lymphedema, not elsewhere classified: Secondary | ICD-10-CM

## 2020-03-28 DIAGNOSIS — I10 Essential (primary) hypertension: Secondary | ICD-10-CM | POA: Diagnosis not present

## 2020-03-28 DIAGNOSIS — G629 Polyneuropathy, unspecified: Secondary | ICD-10-CM

## 2020-03-28 DIAGNOSIS — R112 Nausea with vomiting, unspecified: Secondary | ICD-10-CM

## 2020-03-28 DIAGNOSIS — N1831 Chronic kidney disease, stage 3a: Secondary | ICD-10-CM | POA: Diagnosis not present

## 2020-03-28 MED ORDER — PROMETHAZINE HCL 12.5 MG PO TABS: 12.5000 mg | ORAL_TABLET | Freq: Three times a day (TID) | ORAL | 1 refills | Status: DC | PRN

## 2020-03-28 MED ORDER — AMITRIPTYLINE HCL 10 MG PO TABS: 10.0000 mg | ORAL_TABLET | Freq: Every day | ORAL | 2 refills | Status: DC

## 2020-03-29 ENCOUNTER — Telehealth: Payer: Self-pay | Admitting: *Deleted

## 2020-03-29 LAB — CMP14+EGFR
ALT: 7 IU/L (ref 0–32)
AST: 15 IU/L (ref 0–40)
Albumin/Globulin Ratio: 0.6 — ABNORMAL LOW (ref 1.2–2.2)
Albumin: 2.3 g/dL — ABNORMAL LOW (ref 3.6–4.6)
Alkaline Phosphatase: 120 IU/L — ABNORMAL HIGH (ref 39–117)
BUN/Creatinine Ratio: 8 — ABNORMAL LOW (ref 12–28)
BUN: 9 mg/dL (ref 8–27)
Bilirubin Total: 0.5 mg/dL (ref 0.0–1.2)
CO2: 27 mmol/L (ref 20–29)
Calcium: 8.4 mg/dL — ABNORMAL LOW (ref 8.7–10.3)
Chloride: 105 mmol/L (ref 96–106)
Creatinine, Ser: 1.09 mg/dL — ABNORMAL HIGH (ref 0.57–1.00)
GFR calc Af Amer: 53 mL/min/{1.73_m2} — ABNORMAL LOW (ref >59)
GFR calc non Af Amer: 46 mL/min/{1.73_m2} — ABNORMAL LOW (ref >59)
Globulin, Total: 3.7 g/dL (ref 1.5–4.5)
Glucose: 101 mg/dL — ABNORMAL HIGH (ref 65–99)
Potassium: 4 mmol/L (ref 3.5–5.2)
Sodium: 145 mmol/L — ABNORMAL HIGH (ref 134–144)
Total Protein: 6 g/dL (ref 6.0–8.5)

## 2020-03-29 NOTE — Telephone Encounter (Signed)
Prior Auth for Promethazine HCL 12.5mg -IN Process  Key: [id number]   PA Case ID: [id number]  Your information has been submitted to [employer]. [employer] will review the request and will issue a decision, typically within 1-3 days from your submission. You can check the updated outcome later by reopening this request.  If [employer] has not responded in 1-3 days or if you have any questions about your ePA request, please contact [employer] at [phone]. If you think there may be a problem with your PA request, use our live chat feature at the bottom right.

## 2020-03-29 NOTE — Telephone Encounter (Signed)
Prior Auth for Promethazine HCL 12.5mg -APPROVED till 03/29/21  Pharmacy notified.

## 2020-03-29 NOTE — Telephone Encounter (Signed)
Prior Auth for Amitriptyline HCL-APPROVED till 03/29/21  Pharmacy notified

## 2020-03-29 NOTE — Telephone Encounter (Signed)
Prior Auth for Amitriptyline HCL-In Process   Key: [id number] -   PA Case ID: [id number]  Your information has been submitted to [employer]. [employer] will review the request and will issue a decision, typically within 1-3 days from your submission. You can check the updated outcome later by reopening this request.  If [employer] has not responded in 1-3 days or if you have any questions about your ePA request, please contact [employer] at [phone]. If you think there may be a problem with your PA request, use our live chat feature at the bottom right.

## 2020-03-31 ENCOUNTER — Encounter: Payer: Self-pay | Admitting: Family Medicine

## 2020-04-03 NOTE — Progress Notes (Signed)
Order placed on providers desk to sign and fax order.

## 2020-04-03 NOTE — Progress Notes (Signed)
Is this at [facility] outpatient rehab? I am trying to find number.

## 2020-04-05 ENCOUNTER — Telehealth: Payer: Self-pay | Admitting: Family Medicine

## 2020-04-05 NOTE — Telephone Encounter (Signed)
Legs still oozing still swollen not getting any bigger. Wraps legs but only leaves it on for about 4 hours. What else can she do to help her mother?

## 2020-04-05 NOTE — Telephone Encounter (Signed)
Pts daughter aware of provider feedback and states she will continue wrapping the legs and wait for the appt in [place].

## 2020-04-05 NOTE — Telephone Encounter (Signed)
She needs to see the lymphedema clinic. I know we are having a hard time with the one in [place]. There is always one in [place] we can refer to if she would like.

## 2020-04-09 ENCOUNTER — Encounter: Payer: Self-pay | Admitting: Family Medicine

## 2020-04-09 ENCOUNTER — Ambulatory Visit (INDEPENDENT_AMBULATORY_CARE_PROVIDER_SITE_OTHER): Payer: Medicare Other | Admitting: Family Medicine

## 2020-04-09 DIAGNOSIS — R600 Localized edema: Secondary | ICD-10-CM | POA: Diagnosis not present

## 2020-04-09 DIAGNOSIS — I890 Lymphedema, not elsewhere classified: Secondary | ICD-10-CM

## 2020-04-09 NOTE — Progress Notes (Signed)
Virtual Visit via telephone Note  I connected with Courtney Berry on 04/09/20 at 1304 by telephone and verified that I am speaking with the correct person using two identifiers. Courtney Berry is currently located at home and daughter [name] are currently with her during visit. The provider, Elige Radon Donnette Macmullen, MD is located in their office at time of visit.  Call ended at 1311  I discussed the limitations, risks, security and privacy concerns of performing an evaluation and management service by telephone and the availability of in person appointments. I also discussed with the patient that there may be a patient responsible charge related to this service. The patient expressed understanding and agreed to proceed.   History and Present Illness: The patient is still have lymphedema and is still have a lot of weeping and went to lymphedema clinic. The lymephedema said she needs to see wound care. They have been wrapping but shee is having a lot of swelling as well. They are weeping more on the right leg than left.   No diagnosis found.  Outpatient Encounter Medications as of 04/09/2020  Medication Sig  . acetaminophen (TYLENOL) 500 MG tablet Take 1,000 mg by mouth every 6 (six) hours as needed for moderate pain.  Marland Kitchen amitriptyline (ELAVIL) 10 MG tablet Take 1 tablet (10 mg total) by mouth at bedtime.  Marland Kitchen apixaban (ELIQUIS) 5 MG TABS tablet Take 1 tablet (5 mg total) by mouth 2 (two) times daily.  . Bismuth Tribromoph-Petrolatum (XEROFORM PETROLAT GAUZE 5"X9") MISC Apply 1 application topically daily. (both legs)  . fluticasone (FLONASE) 50 MCG/ACT nasal spray Place 2 sprays into both nostrils daily. (Patient taking differently: Place 2 sprays into both nostrils as needed. )  . lisinopril (ZESTRIL) 20 MG tablet TAKE ONE (1) TABLET EACH DAY  . metoprolol succinate (TOPROL-XL) 25 MG 24 hr tablet TAKE 1 AND 1/2 TABLET TWICE A DAY  . PROAIR HFA 108 (90 Base) MCG/ACT inhaler Inhale 1-2 puffs  into the lungs every 6 (six) hours as needed for wheezing or shortness of breath.  . promethazine (PHENERGAN) 12.5 MG tablet Take 1 tablet (12.5 mg total) by mouth every 8 (eight) hours as needed for nausea or vomiting.  . umeclidinium-vilanterol (ANORO ELLIPTA) 62.5-25 MCG/INH AEPB Inhale 1 puff into the lungs as needed.    No facility-administered encounter medications on file as of 04/09/2020.    Review of Systems  Constitutional: Negative for chills and fever.  Eyes: Negative for visual disturbance.  Respiratory: Negative for chest tightness and shortness of breath.   Cardiovascular: Positive for leg swelling. Negative for chest pain.  Musculoskeletal: Negative for back pain and gait problem.  Skin: Positive for wound. Negative for rash.  Neurological: Negative for light-headedness and headaches.  Psychiatric/Behavioral: Negative for agitation and behavioral problems.  All other systems reviewed and are negative.   Observations/Objective: Patient's daughter was the conversationalist on the phone  Assessment and Plan: Problem List Items Addressed This Visit      Other   Lymphedema of both lower extremities - Primary   Relevant Orders   AMB referral to wound care center    Other Visit Diagnoses    Lower extremity edema       Relevant Orders   AMB referral to wound care center      Patient's lymphedema clinic recommended she go see wound care center did referral for wound care, recommended to continue wrapping for now. Follow up plan: Return if symptoms worsen or fail to improve.  I discussed the assessment and treatment plan with the patient. The patient was provided an opportunity to ask questions and all were answered. The patient agreed with the plan and demonstrated an understanding of the instructions.   The patient was advised to call back or seek an in-person evaluation if the symptoms worsen or if the condition fails to improve as anticipated.  The above  assessment and management plan was discussed with the patient. The patient verbalized understanding of and has agreed to the management plan. Patient is aware to call the clinic if symptoms persist or worsen. Patient is aware when to return to the clinic for a follow-up visit. Patient educated on when it is appropriate to go to the emergency department.    I provided 7 minutes of non-face-to-face time during this encounter.    Worthy Rancher, MD

## 2020-04-12 DIAGNOSIS — R609 Edema, unspecified: Secondary | ICD-10-CM | POA: Diagnosis not present

## 2020-04-12 DIAGNOSIS — R600 Localized edema: Secondary | ICD-10-CM | POA: Diagnosis not present

## 2020-04-19 ENCOUNTER — Telehealth: Payer: Self-pay | Admitting: Family Medicine

## 2020-04-19 NOTE — Telephone Encounter (Signed)
Please advise if you can order this and they pick up?

## 2020-04-20 DIAGNOSIS — I890 Lymphedema, not elsewhere classified: Secondary | ICD-10-CM | POA: Diagnosis not present

## 2020-04-24 NOTE — Telephone Encounter (Signed)
I apologize. It was almost 4 PM when they called and I worked a partial day that day. I will see them tomorrow. If she is having diarrhea - that is a symptom they can't come in with per our office policy. Do we need to change to telephone visit?

## 2020-04-24 NOTE — Telephone Encounter (Signed)
Daughter states "to forget about it since it took her so long to reply".  Tried to explain to the patient that you were off but she stated that they told her you were working from home that day.  They have an apt with [place] tomorrow in office.

## 2020-04-24 NOTE — Telephone Encounter (Signed)
LMTCB to discuss if diarrhea has resolved-if not pt needs virtual visit as our policy/protocol doesn't allow anyone with diarrhea to come in with due to COVID pandemic.

## 2020-04-24 NOTE — Telephone Encounter (Signed)
I do not see this in the documentation. What is going on for them to feel she needs tested?

## 2020-04-25 ENCOUNTER — Ambulatory Visit (INDEPENDENT_AMBULATORY_CARE_PROVIDER_SITE_OTHER): Payer: Medicare Other | Admitting: Family Medicine

## 2020-04-25 DIAGNOSIS — B370 Candidal stomatitis: Secondary | ICD-10-CM | POA: Diagnosis not present

## 2020-04-25 DIAGNOSIS — J302 Other seasonal allergic rhinitis: Secondary | ICD-10-CM

## 2020-04-25 DIAGNOSIS — I890 Lymphedema, not elsewhere classified: Secondary | ICD-10-CM

## 2020-04-25 DIAGNOSIS — G629 Polyneuropathy, unspecified: Secondary | ICD-10-CM

## 2020-04-25 DIAGNOSIS — G479 Sleep disorder, unspecified: Secondary | ICD-10-CM

## 2020-04-25 MED ORDER — FLUTICASONE PROPIONATE 50 MCG/ACT NA SUSP: 2.0000 | Freq: Every day | NASAL | 6 refills | Status: DC

## 2020-04-25 MED ORDER — PREGABALIN 50 MG PO CAPS: 50.0000 mg | ORAL_CAPSULE | Freq: Every day | ORAL | 1 refills | Status: DC

## 2020-04-25 MED ORDER — NYSTATIN 100000 UNIT/ML MT SUSP: 5.0000 mL | Freq: Four times a day (QID) | OROMUCOSAL | 0 refills | Status: DC

## 2020-04-25 NOTE — Telephone Encounter (Signed)
Patient has a follow up appointment scheduled for today at 3:20.

## 2020-04-25 NOTE — Progress Notes (Signed)
Virtual Visit via Telephone Note  I connected with Courtney Berry on 04/25/20 at 4:20 PM by telephone and verified that I am speaking with the correct person using two identifiers. Courtney Berry is currently located at home and her granddaughter is currently with her during this visit. The provider, Loman Brooklyn, FNP is located in their office at time of visit.  I discussed the limitations, risks, security and privacy concerns of performing an evaluation and management service by telephone and the availability of in person appointments. I also discussed with the patient that there may be a patient responsible charge related to this service. The patient expressed understanding and agreed to proceed.  Subjective: PCP: Loman Brooklyn, FNP  Chief Complaint  Patient presents with  . Peripheral Neuropathy   Patient is following up on peripheral neuropathy.  She was started on amitriptyline 10 mg at bedtime 4 weeks ago.  She did not continue the medication as she reports it made her talk out of her head and sleep until lunch the next day.  Gabapentin also made her talk out of her head.  Patient was seen by the wound center last week.  She was prescribed the Circaid system and advised to resume lymphedema clinic therapies.  Patient reports a left earache that started last night.  Her only other symptom is a runny nose.  She has not taken any medications at home to treat.  Patient also reports thrush on her tongue making it difficult to eat.  This has been going on for the past week.  Patient has an old prescription at home for Xanax and reports she took half a tablet last night to help her with sleep.  She is wondering if she can continue this.  She has not tried anything else to help with sleep.   ROS: Per HPI  Current Outpatient Medications:  .  acetaminophen (TYLENOL) 500 MG tablet, Take 1,000 mg by mouth every 6 (six) hours as needed for moderate pain., Disp: , Rfl:  .   amitriptyline (ELAVIL) 10 MG tablet, Take 1 tablet (10 mg total) by mouth at bedtime., Disp: 30 tablet, Rfl: 2 .  apixaban (ELIQUIS) 5 MG TABS tablet, Take 1 tablet (5 mg total) by mouth 2 (two) times daily., Disp: 60 tablet, Rfl: 6 .  Bismuth Tribromoph-Petrolatum (XEROFORM PETROLAT GAUZE 5"X9") MISC, Apply 1 application topically daily. (both legs), Disp: 30 each, Rfl: 2 .  fluticasone (FLONASE) 50 MCG/ACT nasal spray, Place 2 sprays into both nostrils daily. (Patient taking differently: Place 2 sprays into both nostrils as needed. ), Disp: 16 g, Rfl: 6 .  lisinopril (ZESTRIL) 20 MG tablet, TAKE ONE (1) TABLET EACH DAY, Disp: 90 tablet, Rfl: 3 .  metoprolol succinate (TOPROL-XL) 25 MG 24 hr tablet, TAKE 1 AND 1/2 TABLET TWICE A DAY, Disp: 270 tablet, Rfl: 3 .  PROAIR HFA 108 (90 Base) MCG/ACT inhaler, Inhale 1-2 puffs into the lungs every 6 (six) hours as needed for wheezing or shortness of breath., Disp: 18 g, Rfl: 0 .  promethazine (PHENERGAN) 12.5 MG tablet, Take 1 tablet (12.5 mg total) by mouth every 8 (eight) hours as needed for nausea or vomiting., Disp: 30 tablet, Rfl: 1 .  umeclidinium-vilanterol (ANORO ELLIPTA) 62.5-25 MCG/INH AEPB, Inhale 1 puff into the lungs as needed. , Disp: , Rfl:   Allergies  Allergen Reactions  . Darvocet [Propoxyphene N-Acetaminophen] Other (See Comments)    unknown  . Doxycycline Itching  . Nitrofurantoin Monohyd Macro  Other (See Comments)    unknown  . Omnicef [Cefdinir]     Sick, rash  . Plavix [Clopidogrel Bisulfate] Swelling    Face swells  . Sulfa Antibiotics Other (See Comments)    "face got puffy"  . Penicillins Rash    Has patient had a PCN reaction causing immediate rash, facial/tongue/throat swelling, SOB or lightheadedness with hypotension: Yes Has patient had a PCN reaction causing severe rash involving mucus membranes or skin necrosis: No Has patient had a PCN reaction that required hospitalization: No Has patient had a PCN reaction  occurring within the last 10 years: No If all of the above answers are "NO", then may proceed with Cephalosporin use.    Past Medical History:  Diagnosis Date  . COPD (chronic obstructive pulmonary disease) (HCC)   . Diverticulosis   . Essential hypertension   . History of chest pain    Low risk Cardiolite 2010  . Lymphedema   . Mixed hyperlipidemia   . PAF (paroxysmal atrial fibrillation) (HCC)   . Rheumatoid arthritis (HCC)     Observations/Objective: A&O  No respiratory distress or wheezing audible over the phone Mood, judgement, and thought processes all WNL  Assessment and Plan: 1. Neuropathy - Uncontrolled.  Patient has failed therapy with gabapentin and amitriptyline.  Prescribed Lyrica today. - pregabalin (LYRICA) 50 MG capsule; Take 1 capsule (50 mg total) by mouth at bedtime.  Dispense: 30 capsule; Refill: 1  2. Lymphedema of both lower extremities - Patient has been evaluated by the wound center and advised to resume lymphedema clinic therapies.  Circaid compression system ordered which she is waiting for.  3. Seasonal allergies - Uncontrolled.  Encouraged Flonase in the a.m. and an antihistamine in the p.m. - fluticasone (FLONASE) 50 MCG/ACT nasal spray; Place 2 sprays into both nostrils daily.  Dispense: 16 g; Refill: 6  4. Oral candidiasis - Active - treating with nystatin. - nystatin (MYCOSTATIN) 100000 UNIT/ML suspension; Take 5 mLs (500,000 Units total) by mouth 4 (four) times daily.  Dispense: 120 mL; Refill: 0  5. Difficulty sleeping - Discussed Xanax as not appropriate treatment of sleep.  Encouraged her to try OTC ZzzQuil, Unisom, and/or melatonin.   Follow Up Instructions: Return in about 4 weeks (around 05/23/2020) for neuropathy.  I discussed the assessment and treatment plan with the patient. The patient was provided an opportunity to ask questions and all were answered. The patient agreed with the plan and demonstrated an understanding of the  instructions.   The patient was advised to call back or seek an in-person evaluation if the symptoms worsen or if the condition fails to improve as anticipated.  The above assessment and management plan was discussed with the patient. The patient verbalized understanding of and has agreed to the management plan. Patient is aware to call the clinic if symptoms persist or worsen. Patient is aware when to return to the clinic for a follow-up visit. Patient educated on when it is appropriate to go to the emergency department.   Time call ended: 4:40 PM  I provided 22 minutes of non-face-to-face time during this encounter.  Deliah Boston, MSN, APRN, FNP-C [facility] 04/25/20

## 2020-04-26 ENCOUNTER — Telehealth: Payer: Self-pay | Admitting: *Deleted

## 2020-04-26 NOTE — Telephone Encounter (Signed)
Prior Auth for Pregabalin 50MG  capsules- APPROVED  PHARMACY AWARE   Key:  PA Case ID: [id number]  Rx #: [id number]  Your information has been submitted to [employer]. [employer] will review the request and will issue a decision, typically within 1-3 days from your submission. You can check the updated outcome later by reopening this request.  If [employer] has not responded in 1-3 days or if you have any questions about your ePA request, please contact [employer] at [phone]. If you think there may be a problem with your PA request, use our live chat feature at the bottom right.

## 2020-04-26 NOTE — Telephone Encounter (Signed)
Prior Auth for Pregabalin 50MG  capsules- In Process  Key:  PA Case ID: [id number]  Rx #: [id number]  Your information has been submitted to [employer]. [employer] will review the request and will issue a decision, typically within 1-3 days from your submission. You can check the updated outcome later by reopening this request.  If [employer] has not responded in 1-3 days or if you have any questions about your ePA request, please contact [employer] at [phone]. If you think there may be a problem with your PA request, use our live chat feature at the bottom right.

## 2020-04-28 ENCOUNTER — Encounter: Payer: Self-pay | Admitting: Family Medicine

## 2020-05-01 ENCOUNTER — Telehealth: Payer: Self-pay | Admitting: Family Medicine

## 2020-05-01 NOTE — Telephone Encounter (Signed)
Courtney Berry states that pt will not eat anything at all now for 3 weeks. She is wanting to know if there is a medication that will increase her appetite. Requesting to speak with the nurse.

## 2020-05-01 NOTE — Telephone Encounter (Signed)
Is she not eating because of her tongue or another reason? If her tongue, she needs to come in so we can see what is going on.

## 2020-05-02 MED ORDER — MIRTAZAPINE 7.5 MG PO TABS: 7.5000 mg | ORAL_TABLET | Freq: Every day | ORAL | 2 refills | Status: DC

## 2020-05-02 NOTE — Telephone Encounter (Signed)
Her appetite is gone.  She only eats a few bites but will drink.  She says she is ready to go home ,(depressed).  Daughter thinks she should have a medicine to encourage an appetite cause she has lost over 20 pounds over the last few months.

## 2020-05-02 NOTE — Telephone Encounter (Signed)
I sent mirtazapine 7.5 mg to [facility].  She should take this at night.  This will help with both depression and her appetite.  I did start the lowest dose as I do everything, and we can go up if needed

## 2020-05-02 NOTE — Telephone Encounter (Signed)
Patient aware and verbalized understanding. °

## 2020-05-02 NOTE — Addendum Note (Signed)
Addended by: Deliah Boston F on: 05/02/2020 12:45 PM   Modules accepted: Orders

## 2020-05-15 ENCOUNTER — Other Ambulatory Visit: Payer: Self-pay | Admitting: Family Medicine

## 2020-05-15 DIAGNOSIS — I890 Lymphedema, not elsewhere classified: Secondary | ICD-10-CM

## 2020-05-22 DIAGNOSIS — I129 Hypertensive chronic kidney disease with stage 1 through stage 4 chronic kidney disease, or unspecified chronic kidney disease: Secondary | ICD-10-CM | POA: Diagnosis not present

## 2020-05-22 DIAGNOSIS — Z888 Allergy status to other drugs, medicaments and biological substances status: Secondary | ICD-10-CM | POA: Diagnosis not present

## 2020-05-22 DIAGNOSIS — Z881 Allergy status to other antibiotic agents status: Secondary | ICD-10-CM | POA: Diagnosis not present

## 2020-05-22 DIAGNOSIS — I480 Paroxysmal atrial fibrillation: Secondary | ICD-10-CM | POA: Diagnosis not present

## 2020-05-22 DIAGNOSIS — M069 Rheumatoid arthritis, unspecified: Secondary | ICD-10-CM | POA: Diagnosis not present

## 2020-05-22 DIAGNOSIS — Z882 Allergy status to sulfonamides status: Secondary | ICD-10-CM | POA: Diagnosis not present

## 2020-05-22 DIAGNOSIS — E785 Hyperlipidemia, unspecified: Secondary | ICD-10-CM | POA: Diagnosis not present

## 2020-05-22 DIAGNOSIS — Z880 Allergy status to penicillin: Secondary | ICD-10-CM | POA: Diagnosis not present

## 2020-05-22 DIAGNOSIS — Z79899 Other long term (current) drug therapy: Secondary | ICD-10-CM | POA: Diagnosis not present

## 2020-05-22 DIAGNOSIS — Z7901 Long term (current) use of anticoagulants: Secondary | ICD-10-CM | POA: Diagnosis not present

## 2020-05-22 DIAGNOSIS — J449 Chronic obstructive pulmonary disease, unspecified: Secondary | ICD-10-CM | POA: Diagnosis not present

## 2020-05-22 DIAGNOSIS — I890 Lymphedema, not elsewhere classified: Secondary | ICD-10-CM | POA: Diagnosis not present

## 2020-05-22 DIAGNOSIS — N183 Chronic kidney disease, stage 3 unspecified: Secondary | ICD-10-CM | POA: Diagnosis not present

## 2020-05-29 ENCOUNTER — Ambulatory Visit (INDEPENDENT_AMBULATORY_CARE_PROVIDER_SITE_OTHER): Payer: Medicare Other | Admitting: Family Medicine

## 2020-05-29 ENCOUNTER — Other Ambulatory Visit: Payer: Self-pay

## 2020-05-29 ENCOUNTER — Encounter: Payer: Self-pay | Admitting: Family Medicine

## 2020-05-29 DIAGNOSIS — G629 Polyneuropathy, unspecified: Secondary | ICD-10-CM | POA: Diagnosis not present

## 2020-05-29 MED ORDER — MIRTAZAPINE 7.5 MG PO TABS: 7.5000 mg | ORAL_TABLET | Freq: Every day | ORAL | 2 refills | Status: DC

## 2020-05-29 MED ORDER — PREGABALIN 50 MG PO CAPS: 50.0000 mg | ORAL_CAPSULE | Freq: Every day | ORAL | 1 refills | Status: DC

## 2020-05-29 NOTE — Progress Notes (Signed)
Assessment & Plan:  1. Neuropathy - Prescribed Lyrica to try again since we are not sure if she tried it before.  - pregabalin (LYRICA) 50 MG capsule; Take 1 capsule (50 mg total) by mouth at bedtime.  Dispense: 30 capsule; Refill: 1   Return in about 6 weeks (around 07/10/2020) for follow-up of chronic medication conditions.  Deliah Boston, MSN, APRN, FNP-C [facility]  Subjective:    Patient ID: Courtney Berry, female    DOB: 10-30-1933, 86 y.o.   MRN: [id number]  Patient Care Team: Gwenlyn Fudge, FNP as PCP - General (Family Medicine) Jonelle Sidle, MD as PCP - Cardiology (Cardiology) Kari Baars, MD as Consulting Physician (Pulmonary Disease) Corbin Ade, MD as Consulting Physician (Gastroenterology)   Chief Complaint:  Chief Complaint  Patient presents with  . 4 week re check    4 week follow of neuropathy.  Patient states it has improved.    HPI: Courtney Berry is a 86 y.o. female presenting on 05/29/2020 for 4 week re check (4 week follow of neuropathy.  Patient states it has improved.)  Patient is accompanied by her daughter who she is okay with being present.   Patient is here for a follow-up of neuropathy. She was prescribed Lyrica 50 mg QHS a little more than a month ago. She is not sure if she ever took it or not as her husband handles her medications. She knows if she did, she is out now. She is experiencing the burning pain in her lower extremities.   New complaints: None  Social history:  Relevant past medical, surgical, family and social history reviewed and updated as indicated. Interim medical history since our last visit reviewed.  Allergies and medications reviewed and updated.  DATA REVIEWED: CHART IN EPIC  ROS: Negative unless specifically indicated above in HPI.    Current Outpatient Medications:  .  acetaminophen (TYLENOL) 500 MG tablet, Take 1,000 mg by mouth every 6 (six) hours as needed for moderate  pain., Disp: , Rfl:  .  apixaban (ELIQUIS) 5 MG TABS tablet, Take 1 tablet (5 mg total) by mouth 2 (two) times daily., Disp: 60 tablet, Rfl: 6 .  Bismuth Tribromoph-Petrolatum (XEROFORM PETROLAT GAUZE 5"X9") MISC, Apply 1 application topically daily. (both legs), Disp: 30 each, Rfl: 2 .  fluticasone (FLONASE) 50 MCG/ACT nasal spray, Place 2 sprays into both nostrils daily., Disp: 16 g, Rfl: 6 .  lisinopril (ZESTRIL) 20 MG tablet, TAKE ONE (1) TABLET EACH DAY, Disp: 90 tablet, Rfl: 3 .  metoprolol succinate (TOPROL-XL) 25 MG 24 hr tablet, TAKE 1 AND 1/2 TABLET TWICE A DAY, Disp: 270 tablet, Rfl: 3 .  mirtazapine (REMERON) 7.5 MG tablet, Take 1 tablet (7.5 mg total) by mouth at bedtime., Disp: 30 tablet, Rfl: 2 .  nystatin (MYCOSTATIN) 100000 UNIT/ML suspension, Take 5 mLs (500,000 Units total) by mouth 4 (four) times daily., Disp: 120 mL, Rfl: 0 .  PROAIR HFA 108 (90 Base) MCG/ACT inhaler, Inhale 1-2 puffs into the lungs every 6 (six) hours as needed for wheezing or shortness of breath., Disp: 18 g, Rfl: 0 .  umeclidinium-vilanterol (ANORO ELLIPTA) 62.5-25 MCG/INH AEPB, Inhale 1 puff into the lungs as needed. , Disp: , Rfl:    Allergies  Allergen Reactions  . Darvocet [Propoxyphene N-Acetaminophen] Other (See Comments)    unknown  . Doxycycline Itching  . Nitrofurantoin Monohyd Macro Other (See Comments)    unknown  . Omnicef [Cefdinir]  Sick, rash  . Plavix [Clopidogrel Bisulfate] Swelling    Face swells  . Sulfa Antibiotics Other (See Comments)    "face got puffy"  . Amitriptyline Other (See Comments)    Made her "talk out of her head" and sleep until lunch the next day.  . Gabapentin Other (See Comments)    Made her "talk out of her head".  . Penicillins Rash    Has patient had a PCN reaction causing immediate rash, facial/tongue/throat swelling, SOB or lightheadedness with hypotension: Yes Has patient had a PCN reaction causing severe rash involving mucus membranes or skin  necrosis: No Has patient had a PCN reaction that required hospitalization: No Has patient had a PCN reaction occurring within the last 10 years: No If all of the above answers are "NO", then may proceed with Cephalosporin use.    Past Medical History:  Diagnosis Date  . COPD (chronic obstructive pulmonary disease) (HCC)   . Diverticulosis   . Essential hypertension   . History of chest pain    Low risk Cardiolite 2010  . Lymphedema   . Mixed hyperlipidemia   . PAF (paroxysmal atrial fibrillation) (HCC)   . Rheumatoid arthritis [facility])     Past Surgical History:  Procedure Laterality Date  . TOTAL ABDOMINAL HYSTERECTOMY  1969    Social History   Socioeconomic History  . Marital status: Married    Spouse name: Not on file  . Number of children: Not on file  . Years of education: Not on file  . Highest education level: Not on file  Occupational History  . Occupation: Retired  Tobacco Use  . Smoking status: Never Smoker  . Smokeless tobacco: Never Used  . Tobacco comment: Tobacco use-no  Vaping Use  . Vaping Use: Never used  Substance and Sexual Activity  . Alcohol use: No    Alcohol/week: 0.0 standard drinks  . Drug use: No  . Sexual activity: Not on file  Other Topics Concern  . Not on file  Social History Narrative  . Not on file   Social Determinants of Health   Financial Resource Strain:   . Difficulty of Paying Living Expenses:   Food Insecurity:   . Worried About Programme researcher, broadcasting/film/video in the Last Year:   . Barista in the Last Year:   Transportation Needs:   . Freight forwarder (Medical):   Marland Kitchen Lack of Transportation (Non-Medical):   Physical Activity:   . Days of Exercise per Week:   . Minutes of Exercise per Session:   Stress:   . Feeling of Stress :   Social Connections:   . Frequency of Communication with Friends and Family:   . Frequency of Social Gatherings with Friends and Family:   . Attends Religious Services:   . Active Member of  Clubs or Organizations:   . Attends Banker Meetings:   Marland Kitchen Marital Status:   Intimate Partner Violence:   . Fear of Current or Ex-Partner:   . Emotionally Abused:   Marland Kitchen Physically Abused:   . Sexually Abused:         Objective:    BP (!) 122/57   Pulse (!) 54   Temp 97.6 F (36.4 C) (Temporal)   SpO2 91%   Wt Readings from Last 3 Encounters:  03/17/20 278 lb (126.1 kg)  03/01/20 208 lb (94.3 kg)  01/09/20 232 lb (105.2 kg)    Physical Exam Vitals reviewed.  Constitutional:  General: She is not in acute distress.    Appearance: Normal appearance. She is not ill-appearing, toxic-appearing or diaphoretic.  HENT:     Head: Normocephalic and atraumatic.  Eyes:     General: No scleral icterus.       Right eye: No discharge.        Left eye: No discharge.     Conjunctiva/sclera: Conjunctivae normal.  Cardiovascular:     Rate and Rhythm: Normal rate and regular rhythm.     Heart sounds: Normal heart sounds. No murmur heard.  No friction rub. No gallop.   Pulmonary:     Effort: Pulmonary effort is normal. No respiratory distress.     Breath sounds: Normal breath sounds. No stridor. No wheezing, rhonchi or rales.  Musculoskeletal:        General: Normal range of motion.     Cervical back: Normal range of motion.  Skin:    General: Skin is warm and dry.     Capillary Refill: Capillary refill takes less than 2 seconds.  Neurological:     General: No focal deficit present.     Mental Status: She is alert and oriented to person, place, and time. Mental status is at baseline.  Psychiatric:        Mood and Affect: Mood normal.        Behavior: Behavior normal.        Thought Content: Thought content normal.        Judgment: Judgment normal.     Lab Results  Component Value Date   TSH 1.004 08/01/2018   Lab Results  Component Value Date   WBC 8.9 03/17/2020   HGB 12.0 03/17/2020   HCT 40.8 03/17/2020   MCV 103.8 (H) 03/17/2020   PLT 210 03/17/2020     Lab Results  Component Value Date   NA 145 (H) 03/28/2020   K 4.0 03/28/2020   CO2 27 03/28/2020   GLUCOSE 101 (H) 03/28/2020   BUN 9 03/28/2020   CREATININE 1.09 (H) 03/28/2020   BILITOT 0.5 03/28/2020   ALKPHOS 120 (H) 03/28/2020   AST 15 03/28/2020   ALT 7 03/28/2020   PROT 6.0 03/28/2020   ALBUMIN 2.3 (L) 03/28/2020   CALCIUM 8.4 (L) 03/28/2020   ANIONGAP 12 03/17/2020   Lab Results  Component Value Date   CHOL 145 09/22/2019   Lab Results  Component Value Date   HDL 40 09/22/2019   Lab Results  Component Value Date   LDLCALC 75 09/22/2019   Lab Results  Component Value Date   TRIG 178 (H) 09/22/2019   Lab Results  Component Value Date   CHOLHDL 3.6 09/22/2019   No results found for: HGBA1C

## 2020-05-30 ENCOUNTER — Encounter: Payer: Self-pay | Admitting: Family Medicine

## 2020-06-05 DIAGNOSIS — M7989 Other specified soft tissue disorders: Secondary | ICD-10-CM | POA: Diagnosis not present

## 2020-06-05 DIAGNOSIS — R600 Localized edema: Secondary | ICD-10-CM | POA: Diagnosis not present

## 2020-06-05 DIAGNOSIS — I890 Lymphedema, not elsewhere classified: Secondary | ICD-10-CM | POA: Diagnosis not present

## 2020-06-13 DIAGNOSIS — M79604 Pain in right leg: Secondary | ICD-10-CM | POA: Diagnosis not present

## 2020-06-13 DIAGNOSIS — I890 Lymphedema, not elsewhere classified: Secondary | ICD-10-CM | POA: Diagnosis not present

## 2020-06-13 DIAGNOSIS — R52 Pain, unspecified: Secondary | ICD-10-CM | POA: Diagnosis not present

## 2020-06-21 ENCOUNTER — Other Ambulatory Visit: Payer: Self-pay | Admitting: Cardiology

## 2020-07-10 DIAGNOSIS — I890 Lymphedema, not elsewhere classified: Secondary | ICD-10-CM | POA: Diagnosis not present

## 2020-07-11 ENCOUNTER — Ambulatory Visit (INDEPENDENT_AMBULATORY_CARE_PROVIDER_SITE_OTHER): Payer: Medicare Other | Admitting: Cardiology

## 2020-07-11 ENCOUNTER — Encounter: Payer: Self-pay | Admitting: Cardiology

## 2020-07-11 VITALS — BP 142/70 | HR 62 | Ht 67.0 in | Wt 206.8 lb

## 2020-07-11 DIAGNOSIS — I10 Essential (primary) hypertension: Secondary | ICD-10-CM

## 2020-07-11 DIAGNOSIS — I480 Paroxysmal atrial fibrillation: Secondary | ICD-10-CM | POA: Diagnosis not present

## 2020-07-11 DIAGNOSIS — I890 Lymphedema, not elsewhere classified: Secondary | ICD-10-CM | POA: Diagnosis not present

## 2020-07-11 NOTE — Progress Notes (Signed)
Cardiology Office Note  Date: 07/11/2020   ID: Courtney Berry, DOB [birth year], MRN [id number]  PCP:  Gwenlyn Fudge, FNP  Cardiologist:  Nona Dell, MD Electrophysiologist:  None   Chief Complaint  Patient presents with  . Cardiac follow-up    History of Present Illness: Courtney Berry is an 86 y.o. female last assessed via telehealth encounter in February.  She is here with her daughter for a follow-up visit.  She does not report any recurring chest pain, does have occasional palpitations but states that these have not been progressive.  She has been using compression for treatment of lymphedema, this has been fairly effective.  Weight is down a few pounds from April.  I reviewed her medications which are listed below.  Regimen includes Eliquis, Toprol-XL, and lisinopril.  Past Medical History:  Diagnosis Date  . COPD (chronic obstructive pulmonary disease) (HCC)   . Diverticulosis   . Essential hypertension   . History of chest pain    Low risk Cardiolite 2010  . Lymphedema   . Mixed hyperlipidemia   . PAF (paroxysmal atrial fibrillation) (HCC)   . Rheumatoid arthritis [facility])     Past Surgical History:  Procedure Laterality Date  . TOTAL ABDOMINAL HYSTERECTOMY  1969    Current Outpatient Medications  Medication Sig Dispense Refill  . acetaminophen (TYLENOL) 500 MG tablet Take 1,000 mg by mouth every 6 (six) hours as needed for moderate pain.    Marland Kitchen apixaban (ELIQUIS) 5 MG TABS tablet Take 1 tablet (5 mg total) by mouth 2 (two) times daily. 60 tablet 6  . Bismuth Tribromoph-Petrolatum (XEROFORM PETROLAT GAUZE 5"X9") MISC Apply 1 application topically daily. (both legs) 30 each 2  . fluticasone (FLONASE) 50 MCG/ACT nasal spray Place 2 sprays into both nostrils daily. 16 g 6  . lisinopril (ZESTRIL) 20 MG tablet TAKE ONE (1) TABLET EACH DAY 90 tablet 3  . metoprolol succinate (TOPROL-XL) 25 MG 24 hr tablet TAKE 1 AND 1/2 TABLETS BY MOUTH TWICE DAILY 270 tablet 2   . mirtazapine (REMERON) 7.5 MG tablet Take 1 tablet (7.5 mg total) by mouth at bedtime. 30 tablet 2  . nystatin (MYCOSTATIN) 100000 UNIT/ML suspension Take 5 mLs (500,000 Units total) by mouth 4 (four) times daily. 120 mL 0  . pregabalin (LYRICA) 50 MG capsule Take 1 capsule (50 mg total) by mouth at bedtime. 30 capsule 1  . PROAIR HFA 108 (90 Base) MCG/ACT inhaler Inhale 1-2 puffs into the lungs every 6 (six) hours as needed for wheezing or shortness of breath. 18 g 0  . umeclidinium-vilanterol (ANORO ELLIPTA) 62.5-25 MCG/INH AEPB Inhale 1 puff into the lungs as needed.      No current facility-administered medications for this visit.   Allergies:  Darvocet [propoxyphene n-acetaminophen], Doxycycline, Nitrofurantoin monohyd macro, Omnicef [cefdinir], Plavix [clopidogrel bisulfate], Sulfa antibiotics, Amitriptyline, Gabapentin, and Penicillins   ROS:   No syncope.  Physical Exam: VS:  BP (!) 142/70   Pulse 62   Ht 5\' 7"  (1.702 m)   Wt 206 lb 12.8 oz (93.8 kg)   SpO2 93%   BMI 32.39 kg/m , BMI Body mass index is 32.39 kg/m.  Wt Readings from Last 3 Encounters:  07/11/20 206 lb 12.8 oz (93.8 kg)  03/17/20 278 lb (126.1 kg)  03/01/20 208 lb (94.3 kg)    General: Elderly woman, seated in wheelchair. HEENT: Conjunctiva and lids normal, wearing a mask. Neck: Supple, no elevated JVP or carotid bruits, no thyromegaly. Lungs:  Clear to auscultation, nonlabored breathing at rest. Cardiac: Regular rate and rhythm, no S3, soft systolic murmur. Extremities: Bilateral compression wraps in place.  ECG:  An ECG dated 03/01/2020 was personally reviewed today and demonstrated:  Rapid atrial fibrillation with nonspecific T wave changes.  Recent Labwork: 10/26/2019: Magnesium 1.7 03/17/2020: Hemoglobin 12.0; Platelets 210 03/28/2020: ALT 7; AST 15; BUN 9; Creatinine, Ser 1.09; Potassium 4.0; Sodium 145     Component Value Date/Time   CHOL 145 09/22/2019 1224   TRIG 178 (H) 09/22/2019 1224   HDL  40 09/22/2019 1224   CHOLHDL 3.6 09/22/2019 1224   CHOLHDL 3.7 08/02/2018 0417   VLDL 38 08/02/2018 0417   LDLCALC 75 09/22/2019 1224    Other Studies Reviewed Today:  Lexiscan Myoview 08/30/2018:  There was no ST segment deviation noted during stress.  The study is normal. No ischemia or scar.  This is a low risk study.  Nuclear stress EF: 77%.  Echocardiogram 08/02/2018: Study Conclusions   - Left ventricle: The cavity size was normal. Wall thickness was  increased increased in a pattern of mild to moderate LVH.  Systolic function was normal. The estimated ejection fraction was  in the range of 60% to 65%. Wall motion was normal; there were no  regional wall motion abnormalities. Doppler parameters are  consistent with abnormal left ventricular relaxation (grade 1  diastolic dysfunction).  - Aortic valve: Moderately calcified annulus. Trileaflet.  - Mitral valve: Mildly calcified annulus. There was trivial  regurgitation.  - Right atrium: Central venous pressure (est): 3 mm Hg.  - Atrial septum: No defect or patent foramen ovale was identified.  - Tricuspid valve: There was trivial regurgitation.  - Pulmonary arteries: Systolic pressure could not be accurately  estimated.  - Pericardium, extracardiac: There was no pericardial effusion.   Assessment and Plan:  1.  Paroxysmal atrial fibrillation, CHA2DS2-VASc score is 4.  She continues on Eliquis for stroke prophylaxis and Toprol-XL for symptom control.  Heart rate is regular today.  No changes made to current regimen.  I reviewed her lab work from May.  2.  Chronic lymphedema.  Continues to use compression wraps.  3.  Essential hypertension, also on lisinopril.  Keep follow-up with PCP.  Medication Adjustments/Labs and Tests Ordered: Current medicines are reviewed at length with the patient today.  Concerns regarding medicines are outlined above.   Tests Ordered: No orders of the defined types were  placed in this encounter.   Medication Changes: No orders of the defined types were placed in this encounter.   Disposition:  Follow up 6 months in the [place] office.  Signed, Jonelle Sidle, MD, [facility] 07/11/2020 2:22 PM    [facility] at [facility] [address] [place], [place], [place] [address] Phone: [phone]; Fax: [phone]

## 2020-07-11 NOTE — Patient Instructions (Signed)

## 2020-07-12 ENCOUNTER — Ambulatory Visit: Payer: Medicare Other | Admitting: Family Medicine

## 2020-07-24 ENCOUNTER — Other Ambulatory Visit: Payer: Self-pay | Admitting: Cardiology

## 2020-07-25 ENCOUNTER — Ambulatory Visit: Payer: Medicare Other | Admitting: Family Medicine

## 2020-08-10 ENCOUNTER — Telehealth: Payer: Self-pay | Admitting: Cardiology

## 2020-08-10 MED ORDER — APIXABAN 5 MG PO TABS: 5.0000 mg | ORAL_TABLET | Freq: Two times a day (BID) | ORAL | 0 refills | Status: DC

## 2020-08-10 NOTE — Telephone Encounter (Signed)
Courtney Berry -daughter called stating that [facility] is out of Eliquis. She is wanting to know if we have samples until [facility] gets medication in.  Please call [phone].

## 2020-08-10 NOTE — Telephone Encounter (Signed)
Advised that eliquis samples are available for pick up. 

## 2020-08-17 ENCOUNTER — Other Ambulatory Visit: Payer: Self-pay | Admitting: *Deleted

## 2020-08-17 MED ORDER — APIXABAN 5 MG PO TABS: 5.0000 mg | ORAL_TABLET | Freq: Two times a day (BID) | ORAL | 0 refills | Status: DC

## 2020-08-21 ENCOUNTER — Encounter: Payer: Self-pay | Admitting: Family Medicine

## 2020-08-21 ENCOUNTER — Ambulatory Visit (INDEPENDENT_AMBULATORY_CARE_PROVIDER_SITE_OTHER): Payer: Medicare Other | Admitting: Family Medicine

## 2020-08-21 ENCOUNTER — Other Ambulatory Visit: Payer: Self-pay

## 2020-08-21 VITALS — BP 150/66 | HR 61 | Temp 97.8°F

## 2020-08-21 DIAGNOSIS — E785 Hyperlipidemia, unspecified: Secondary | ICD-10-CM

## 2020-08-21 DIAGNOSIS — J449 Chronic obstructive pulmonary disease, unspecified: Secondary | ICD-10-CM

## 2020-08-21 DIAGNOSIS — I890 Lymphedema, not elsewhere classified: Secondary | ICD-10-CM

## 2020-08-21 DIAGNOSIS — G629 Polyneuropathy, unspecified: Secondary | ICD-10-CM | POA: Diagnosis not present

## 2020-08-21 DIAGNOSIS — S81802A Unspecified open wound, left lower leg, initial encounter: Secondary | ICD-10-CM | POA: Diagnosis not present

## 2020-08-21 DIAGNOSIS — I10 Essential (primary) hypertension: Secondary | ICD-10-CM

## 2020-08-21 MED ORDER — DULOXETINE HCL 30 MG PO CPEP: 30.0000 mg | ORAL_CAPSULE | Freq: Every day | ORAL | 2 refills | Status: DC

## 2020-08-21 NOTE — Progress Notes (Signed)
Assessment & Plan:  1. Neuropathy - Patient did not tolerate gabapentin or Lyrica.  Started her on Cymbalta today. - CBC with Differential/Platelet; Future - CMP14+EGFR; Future - DULoxetine (CYMBALTA) 30 MG capsule; Take 1 capsule (30 mg total) by mouth daily.  Dispense: 30 capsule; Refill: 2  2. Essential hypertension - Elevated today, but patient reports adequate control at home. - CBC with Differential/Platelet; Future - CMP14+EGFR; Future - Lipid panel; Future  3. Chronic obstructive pulmonary disease, unspecified COPD type ([place]) - Discussed I did not want her to have an as needed steroid at home as she needed to have a visit if she is having an exacerbation for proper assessment.  She does not wish for me to refer her to pulmonology at this time.  4. Lymphedema of both lower extremities - Encouraged patient to elevate her feet when she is sitting to help with edema. We will reach out to Dr. Ladona Horns to see if he has another recommendation for her feet.   5. Dyslipidemia - Labs to assess. Patient will return so that she is fasting.  - CBC with Differential/Platelet; Future - CMP14+EGFR; Future - Lipid panel; Future  6. Wound of left lower extremity, initial encounter - Wound cleansed with normal saline, covered with Xeroform, followed by a dry dressing. Patient to change daily.    Return in about 4 weeks (around 09/18/2020) for neuropathy.  Hendricks Limes, MSN, APRN, FNP-C [facility]  Subjective:    Patient ID: Courtney Berry, female    DOB: 1933-07-16, 86 y.o.   MRN: [id number]  Patient Care Team: Loman Brooklyn, FNP as PCP - General (Family Medicine) Satira Sark, MD as PCP - Cardiology (Cardiology) Sinda Du, MD as Consulting Physician (Pulmonary Disease) Daneil Dolin, MD as Consulting Physician (Gastroenterology)   Chief Complaint:  Chief Complaint  Patient presents with  . Hypertension    6 week check up of chronic  medical conditins    HPI: Courtney Berry is a 86 y.o. female presenting on 08/21/2020 for Hypertension (6 week check up of chronic medical conditins)  Patient is accompanied by her daughter who she is okay with being present.  Patient was prescribed Lyrica at her last appointment which she reports she did not tolerate, so she is no longer taking it.  Her daughter states when she would take it she would sleep for 2 days.  Patient also previously failed therapy with gabapentin.  Patient's daughter is requesting a prescription of prednisone to take on an as-needed basis for her COPD.  Patient also mentioned getting a referral to a pulmonologist since Dr. Luan Pulling has retired.  Patient is concerned about swelling in her feet.  She reports swelling in her legs is much better since she started using the circaid compression stockings, but they do not go on her feet. Her lymphedema is managed by Dr. Ladona Horns at [facility] wound center.  New complaints: Patient reports she has a wound to her left leg.   Social history:  Relevant past medical, surgical, family and social history reviewed and updated as indicated. Interim medical history since our last visit reviewed.  Allergies and medications reviewed and updated.  DATA REVIEWED: CHART IN EPIC  ROS: Negative unless specifically indicated above in HPI.    Current Outpatient Medications:  .  acetaminophen (TYLENOL) 500 MG tablet, Take 1,000 mg by mouth every 6 (six) hours as needed for moderate pain., Disp: , Rfl:  .  apixaban (ELIQUIS) 5 MG  TABS tablet, Take 1 tablet (5 mg total) by mouth 2 (two) times daily., Disp: 14 tablet, Rfl: 0 .  Bismuth Tribromoph-Petrolatum (XEROFORM PETROLAT GAUZE 5"X9") MISC, Apply 1 application topically daily. (both legs), Disp: 30 each, Rfl: 2 .  fluticasone (FLONASE) 50 MCG/ACT nasal spray, Place 2 sprays into both nostrils daily., Disp: 16 g, Rfl: 6 .  lisinopril (ZESTRIL) 20 MG tablet, TAKE 1 TABLET BY  MOUTH EVERY DAY, Disp: 90 tablet, Rfl: 2 .  metoprolol succinate (TOPROL-XL) 25 MG 24 hr tablet, TAKE 1 AND 1/2 TABLETS BY MOUTH TWICE DAILY, Disp: 270 tablet, Rfl: 2 .  mirtazapine (REMERON) 7.5 MG tablet, Take 1 tablet (7.5 mg total) by mouth at bedtime., Disp: 30 tablet, Rfl: 2 .  nystatin (MYCOSTATIN) 100000 UNIT/ML suspension, Take 5 mLs (500,000 Units total) by mouth 4 (four) times daily., Disp: 120 mL, Rfl: 0 .  pregabalin (LYRICA) 50 MG capsule, Take 1 capsule (50 mg total) by mouth at bedtime., Disp: 30 capsule, Rfl: 1 .  PROAIR HFA 108 (90 Base) MCG/ACT inhaler, Inhale 1-2 puffs into the lungs every 6 (six) hours as needed for wheezing or shortness of breath., Disp: 18 g, Rfl: 0 .  umeclidinium-vilanterol (ANORO ELLIPTA) 62.5-25 MCG/INH AEPB, Inhale 1 puff into the lungs as needed. , Disp: , Rfl:    Allergies  Allergen Reactions  . Darvocet [Propoxyphene N-Acetaminophen] Other (See Comments)    unknown  . Doxycycline Itching  . Nitrofurantoin Monohyd Macro Other (See Comments)    unknown  . Omnicef [Cefdinir]     Sick, rash  . Plavix [Clopidogrel Bisulfate] Swelling    Face swells  . Sulfa Antibiotics Other (See Comments)    "face got puffy"  . Amitriptyline Other (See Comments)    Made her "talk out of her head" and sleep until lunch the next day.  . Gabapentin Other (See Comments)    Made her "talk out of her head".  . Penicillins Rash    Has patient had a PCN reaction causing immediate rash, facial/tongue/throat swelling, SOB or lightheadedness with hypotension: Yes Has patient had a PCN reaction causing severe rash involving mucus membranes or skin necrosis: No Has patient had a PCN reaction that required hospitalization: No Has patient had a PCN reaction occurring within the last 10 years: No If all of the above answers are "NO", then may proceed with Cephalosporin use.    Past Medical History:  Diagnosis Date  . COPD (chronic obstructive pulmonary disease) ([place])     . Diverticulosis   . Essential hypertension   . History of chest pain    Low risk Cardiolite 2010  . Lymphedema   . Mixed hyperlipidemia   . PAF (paroxysmal atrial fibrillation) ([address])   . Rheumatoid arthritis [facility])     Past Surgical History:  Procedure Laterality Date  . TOTAL ABDOMINAL HYSTERECTOMY  1969    Social History   Socioeconomic History  . Marital status: Married    Spouse name: Not on file  . Number of children: Not on file  . Years of education: Not on file  . Highest education level: Not on file  Occupational History  . Occupation: Retired  Tobacco Use  . Smoking status: Never Smoker  . Smokeless tobacco: Never Used  . Tobacco comment: Tobacco use-no  Vaping Use  . Vaping Use: Never used  Substance and Sexual Activity  . Alcohol use: No    Alcohol/week: 0.0 standard drinks  . Drug use: No  . Sexual activity:  Not on file  Other Topics Concern  . Not on file  Social History Narrative  . Not on file   Social Determinants of Health   Financial Resource Strain:   . Difficulty of Paying Living Expenses: Not on file  Food Insecurity:   . Worried About Charity fundraiser in the Last Year: Not on file  . Ran Out of Food in the Last Year: Not on file  Transportation Needs:   . Lack of Transportation (Medical): Not on file  . Lack of Transportation (Non-Medical): Not on file  Physical Activity:   . Days of Exercise per Week: Not on file  . Minutes of Exercise per Session: Not on file  Stress:   . Feeling of Stress : Not on file  Social Connections:   . Frequency of Communication with Friends and Family: Not on file  . Frequency of Social Gatherings with Friends and Family: Not on file  . Attends Religious Services: Not on file  . Active Member of Clubs or Organizations: Not on file  . Attends Archivist Meetings: Not on file  . Marital Status: Not on file  Intimate Partner Violence:   . Fear of Current or Ex-Partner: Not on file  .  Emotionally Abused: Not on file  . Physically Abused: Not on file  . Sexually Abused: Not on file        Objective:    BP (!) 150/66   Pulse 61   Temp 97.8 F (36.6 C) (Temporal)   SpO2 95%   Wt Readings from Last 3 Encounters:  07/11/20 206 lb 12.8 oz (93.8 kg)  03/17/20 278 lb (126.1 kg)  03/01/20 208 lb (94.3 kg)    Physical Exam Vitals reviewed.  Constitutional:      General: She is not in acute distress.    Appearance: Normal appearance. She is not ill-appearing, toxic-appearing or diaphoretic.  HENT:     Head: Normocephalic and atraumatic.  Eyes:     General: No scleral icterus.       Right eye: No discharge.        Left eye: No discharge.     Conjunctiva/sclera: Conjunctivae normal.  Cardiovascular:     Rate and Rhythm: Normal rate and regular rhythm.     Heart sounds: Normal heart sounds. No murmur heard.  No friction rub. No gallop.   Pulmonary:     Effort: Pulmonary effort is normal. No respiratory distress.     Breath sounds: Normal breath sounds. No stridor. No wheezing, rhonchi or rales.  Musculoskeletal:        General: Normal range of motion.     Cervical back: Normal range of motion.     Right lower leg: Edema present.     Left lower leg: Edema present.  Skin:    General: Skin is warm and dry.     Capillary Refill: Capillary refill takes less than 2 seconds.  Neurological:     General: No focal deficit present.     Mental Status: She is alert and oriented to person, place, and time. Mental status is at baseline.     Gait: Gait abnormal (riding in [facility]).  Psychiatric:        Mood and Affect: Mood normal.        Behavior: Behavior normal.        Thought Content: Thought content normal.        Judgment: Judgment normal.     Lab Results  Component  Value Date   TSH 1.004 08/01/2018   Lab Results  Component Value Date   WBC 8.9 03/17/2020   HGB 12.0 03/17/2020   HCT 40.8 03/17/2020   MCV 103.8 (H) 03/17/2020   PLT 210 03/17/2020   Lab  Results  Component Value Date   NA 145 (H) 03/28/2020   K 4.0 03/28/2020   CO2 27 03/28/2020   GLUCOSE 101 (H) 03/28/2020   BUN 9 03/28/2020   CREATININE 1.09 (H) 03/28/2020   BILITOT 0.5 03/28/2020   ALKPHOS 120 (H) 03/28/2020   AST 15 03/28/2020   ALT 7 03/28/2020   PROT 6.0 03/28/2020   ALBUMIN 2.3 (L) 03/28/2020   CALCIUM 8.4 (L) 03/28/2020   ANIONGAP 12 03/17/2020   Lab Results  Component Value Date   CHOL 145 09/22/2019   Lab Results  Component Value Date   HDL 40 09/22/2019   Lab Results  Component Value Date   LDLCALC 75 09/22/2019   Lab Results  Component Value Date   TRIG 178 (H) 09/22/2019   Lab Results  Component Value Date   CHOLHDL 3.6 09/22/2019   No results found for: HGBA1C

## 2020-08-24 ENCOUNTER — Encounter: Payer: Self-pay | Admitting: Family Medicine

## 2020-08-27 NOTE — Progress Notes (Signed)
LMTCB- [phone]

## 2020-08-28 ENCOUNTER — Telehealth: Payer: Self-pay

## 2020-08-29 ENCOUNTER — Other Ambulatory Visit: Payer: Medicare Other

## 2020-08-29 ENCOUNTER — Other Ambulatory Visit: Payer: Self-pay

## 2020-08-29 DIAGNOSIS — K921 Melena: Secondary | ICD-10-CM

## 2020-08-29 DIAGNOSIS — G629 Polyneuropathy, unspecified: Secondary | ICD-10-CM

## 2020-08-29 DIAGNOSIS — E785 Hyperlipidemia, unspecified: Secondary | ICD-10-CM

## 2020-08-29 DIAGNOSIS — I10 Essential (primary) hypertension: Secondary | ICD-10-CM

## 2020-08-29 LAB — CBC WITH DIFFERENTIAL/PLATELET

## 2020-08-30 LAB — CMP14+EGFR
ALT: 6 IU/L (ref 0–32)
AST: 9 IU/L (ref 0–40)
Albumin/Globulin Ratio: 1.1 — ABNORMAL LOW (ref 1.2–2.2)
Albumin: 3.2 g/dL — ABNORMAL LOW (ref 3.6–4.6)
Alkaline Phosphatase: 104 IU/L (ref 44–121)
BUN/Creatinine Ratio: 13 (ref 12–28)
BUN: 13 mg/dL (ref 8–27)
Bilirubin Total: 0.4 mg/dL (ref 0.0–1.2)
CO2: 27 mmol/L (ref 20–29)
Calcium: 9.2 mg/dL (ref 8.7–10.3)
Chloride: 104 mmol/L (ref 96–106)
Creatinine, Ser: 0.99 mg/dL (ref 0.57–1.00)
GFR calc Af Amer: 60 mL/min/{1.73_m2} (ref >59)
GFR calc non Af Amer: 52 mL/min/{1.73_m2} — ABNORMAL LOW (ref >59)
Globulin, Total: 3 g/dL (ref 1.5–4.5)
Glucose: 87 mg/dL (ref 65–99)
Potassium: 4.1 mmol/L (ref 3.5–5.2)
Sodium: 143 mmol/L (ref 134–144)
Total Protein: 6.2 g/dL (ref 6.0–8.5)

## 2020-08-30 LAB — CBC WITH DIFFERENTIAL/PLATELET
Basophils Absolute: 0 10*3/uL (ref 0.0–0.2)
Basos: 1 %
EOS (ABSOLUTE): 0.5 10*3/uL — ABNORMAL HIGH (ref 0.0–0.4)
Eos: 7 %
Hematocrit: 34.7 % (ref 34.0–46.6)
Hemoglobin: 10.9 g/dL — ABNORMAL LOW (ref 11.1–15.9)
Immature Grans (Abs): 0 10*3/uL (ref 0.0–0.1)
Immature Granulocytes: 0 %
Lymphocytes Absolute: 2.2 10*3/uL (ref 0.7–3.1)
Lymphs: 30 %
MCH: 29.5 pg (ref 26.6–33.0)
MCHC: 31.4 g/dL — ABNORMAL LOW (ref 31.5–35.7)
MCV: 94 fL (ref 79–97)
Monocytes Absolute: 0.8 10*3/uL (ref 0.1–0.9)
Monocytes: 11 %
Neutrophils Absolute: 3.7 10*3/uL (ref 1.4–7.0)
Neutrophils: 51 %
Platelets: 183 10*3/uL (ref 150–450)
RBC: 3.7 x10E6/uL — ABNORMAL LOW (ref 3.77–5.28)
RDW: 11.4 % — ABNORMAL LOW (ref 11.7–15.4)
WBC: 7.3 10*3/uL (ref 3.4–10.8)

## 2020-08-30 LAB — LIPID PANEL
Chol/HDL Ratio: 2.8 ratio (ref 0.0–4.4)
Cholesterol, Total: 141 mg/dL (ref 100–199)
HDL: 50 mg/dL (ref >39)
LDL Chol Calc (NIH): 70 mg/dL (ref 0–99)
Triglycerides: 118 mg/dL (ref 0–149)
VLDL Cholesterol Cal: 21 mg/dL (ref 5–40)

## 2020-08-31 ENCOUNTER — Telehealth: Payer: Self-pay

## 2020-08-31 NOTE — Telephone Encounter (Signed)
Labs advised with patient

## 2020-09-03 ENCOUNTER — Telehealth: Payer: Self-pay

## 2020-09-03 NOTE — Telephone Encounter (Signed)
Lmtcb.

## 2020-09-05 ENCOUNTER — Other Ambulatory Visit: Payer: Self-pay | Admitting: Cardiology

## 2020-09-12 DIAGNOSIS — K625 Hemorrhage of anus and rectum: Secondary | ICD-10-CM | POA: Diagnosis not present

## 2020-10-02 DIAGNOSIS — Z01818 Encounter for other preprocedural examination: Secondary | ICD-10-CM | POA: Diagnosis not present

## 2020-10-04 DIAGNOSIS — M1990 Unspecified osteoarthritis, unspecified site: Secondary | ICD-10-CM | POA: Diagnosis not present

## 2020-10-04 DIAGNOSIS — K649 Unspecified hemorrhoids: Secondary | ICD-10-CM | POA: Diagnosis not present

## 2020-10-04 DIAGNOSIS — J449 Chronic obstructive pulmonary disease, unspecified: Secondary | ICD-10-CM | POA: Diagnosis not present

## 2020-10-04 DIAGNOSIS — N183 Chronic kidney disease, stage 3 unspecified: Secondary | ICD-10-CM | POA: Diagnosis not present

## 2020-10-04 DIAGNOSIS — K625 Hemorrhage of anus and rectum: Secondary | ICD-10-CM | POA: Diagnosis not present

## 2020-10-04 DIAGNOSIS — I480 Paroxysmal atrial fibrillation: Secondary | ICD-10-CM | POA: Diagnosis not present

## 2020-10-04 DIAGNOSIS — Z79899 Other long term (current) drug therapy: Secondary | ICD-10-CM | POA: Diagnosis not present

## 2020-10-04 DIAGNOSIS — Z881 Allergy status to other antibiotic agents status: Secondary | ICD-10-CM | POA: Diagnosis not present

## 2020-10-04 DIAGNOSIS — K5730 Diverticulosis of large intestine without perforation or abscess without bleeding: Secondary | ICD-10-CM | POA: Diagnosis not present

## 2020-10-04 DIAGNOSIS — Z7901 Long term (current) use of anticoagulants: Secondary | ICD-10-CM | POA: Diagnosis not present

## 2020-10-04 DIAGNOSIS — I129 Hypertensive chronic kidney disease with stage 1 through stage 4 chronic kidney disease, or unspecified chronic kidney disease: Secondary | ICD-10-CM | POA: Diagnosis not present

## 2020-10-04 DIAGNOSIS — K644 Residual hemorrhoidal skin tags: Secondary | ICD-10-CM | POA: Diagnosis not present

## 2020-10-04 DIAGNOSIS — Z882 Allergy status to sulfonamides status: Secondary | ICD-10-CM | POA: Diagnosis not present

## 2020-10-04 DIAGNOSIS — E785 Hyperlipidemia, unspecified: Secondary | ICD-10-CM | POA: Diagnosis not present

## 2020-10-04 DIAGNOSIS — M069 Rheumatoid arthritis, unspecified: Secondary | ICD-10-CM | POA: Diagnosis not present

## 2020-10-04 DIAGNOSIS — Z880 Allergy status to penicillin: Secondary | ICD-10-CM | POA: Diagnosis not present

## 2020-11-20 ENCOUNTER — Ambulatory Visit (INDEPENDENT_AMBULATORY_CARE_PROVIDER_SITE_OTHER): Payer: Medicare Other | Admitting: Family Medicine

## 2020-11-20 ENCOUNTER — Telehealth: Payer: Self-pay

## 2020-11-20 DIAGNOSIS — L03116 Cellulitis of left lower limb: Secondary | ICD-10-CM | POA: Diagnosis not present

## 2020-11-20 DIAGNOSIS — L03115 Cellulitis of right lower limb: Secondary | ICD-10-CM

## 2020-11-20 MED ORDER — CIPROFLOXACIN HCL 500 MG PO TABS: 500.0000 mg | ORAL_TABLET | Freq: Two times a day (BID) | ORAL | 0 refills | Status: DC

## 2020-11-20 NOTE — Telephone Encounter (Signed)
Attempted to contact- Line busy  [facility]- schedule appointment Per policy needs appointment for antibiotic

## 2020-11-20 NOTE — Progress Notes (Signed)
Telephone visit  Subjective: CC: leg swelling PCP: Gwenlyn Fudge, FNP NTI:Courtney Berry is a 87 y.o. female calls for telephone consult today. Patient provides verbal consent for consult held via phone.  Due to COVID-19 pandemic this visit was conducted virtually. This visit type was conducted due to national recommendations for restrictions regarding the COVID-19 Pandemic (e.g. social distancing, sheltering in place) in an effort to limit this patient's exposure and mitigate transmission in our community. All issues noted in this document were discussed and addressed.  A physical exam was not performed with this format.   Location of patient: home Location of provider: [facility] Others present for call: none  1. Leg swelling She has known lymphedema and sees a specialist in [place].  She had some leaking of legs 2 weeks ago that has resolved.  She was told to seek evaluation if they started getting red. She is having redness in her legs.  Last time she needed Cipro before to resolve it.  She has last treatment in summer 2021. No fevers. Both legs are swollen, red and hot.  Her specialist is out of the office until next week.   ROS: Per HPI  Allergies  Allergen Reactions  . Darvocet [Propoxyphene N-Acetaminophen] Other (See Comments)    unknown  . Doxycycline Itching  . Nitrofurantoin Monohyd Macro Other (See Comments)    unknown  . Omnicef [Cefdinir]     Sick, rash  . Plavix [Clopidogrel Bisulfate] Swelling    Face swells  . Sulfa Antibiotics Other (See Comments)    "face got puffy"  . Amitriptyline Other (See Comments)    Made her "talk out of her head" and sleep until lunch the next day.  . Gabapentin Other (See Comments)    Made her "talk out of her head".  . Penicillins Rash    Has patient had a PCN reaction causing immediate rash, facial/tongue/throat swelling, SOB or lightheadedness with hypotension: Yes Has patient had a PCN reaction causing severe rash involving mucus  membranes or skin necrosis: No Has patient had a PCN reaction that required hospitalization: No Has patient had a PCN reaction occurring within the last 10 years: No If all of the above answers are "NO", then may proceed with Cephalosporin use.    Past Medical History:  Diagnosis Date  . COPD (chronic obstructive pulmonary disease) ([facility])   . Diverticulosis   . Essential hypertension   . History of chest pain    Low risk Cardiolite 2010  . Lymphedema   . Mixed hyperlipidemia   . PAF (paroxysmal atrial fibrillation) ([facility])   . Rheumatoid arthritis ([facility])     Current Outpatient Medications:  .  acetaminophen (TYLENOL) 500 MG tablet, Take 1,000 mg by mouth every 6 (six) hours as needed for moderate pain., Disp: , Rfl:  .  Bismuth Tribromoph-Petrolatum (XEROFORM PETROLAT GAUZE 5"X9") MISC, Apply 1 application topically daily. (both legs), Disp: 30 each, Rfl: 2 .  DULoxetine (CYMBALTA) 30 MG capsule, Take 1 capsule (30 mg total) by mouth daily., Disp: 30 capsule, Rfl: 2 .  ELIQUIS 5 MG TABS tablet, TAKE 1 TABLET BY MOUTH TWICE A DAY, Disp: 60 tablet, Rfl: 11 .  fluticasone (FLONASE) 50 MCG/ACT nasal spray, Place 2 sprays into both nostrils daily., Disp: 16 g, Rfl: 6 .  lisinopril (ZESTRIL) 20 MG tablet, TAKE 1 TABLET BY MOUTH EVERY DAY, Disp: 90 tablet, Rfl: 2 .  metoprolol succinate (TOPROL-XL) 25 MG 24 hr tablet, TAKE 1 AND 1/2 TABLETS BY MOUTH  TWICE DAILY, Disp: 270 tablet, Rfl: 2 .  mirtazapine (REMERON) 7.5 MG tablet, Take 1 tablet (7.5 mg total) by mouth at bedtime., Disp: 30 tablet, Rfl: 2 .  nystatin (MYCOSTATIN) 100000 UNIT/ML suspension, Take 5 mLs (500,000 Units total) by mouth 4 (four) times daily., Disp: 120 mL, Rfl: 0 .  PROAIR HFA 108 (90 Base) MCG/ACT inhaler, Inhale 1-2 puffs into the lungs every 6 (six) hours as needed for wheezing or shortness of breath., Disp: 18 g, Rfl: 0 .  umeclidinium-vilanterol (ANORO ELLIPTA) 62.5-25 MCG/INH AEPB, Inhale 1 puff into the lungs as  needed. , Disp: , Rfl:   Assessment/ Plan: 87 y.o. female   Bilateral cellulitis of lower leg - Plan: ciprofloxacin (CIPRO) 500 MG tablet  Empiric treatment with Cipro twice daily.  She has multiple drug allergies and this is the only thing that she has been able to use for her legs in the past.  She not had any red flag signs or symptoms including C. difficile, heart palpitations or tenderness issues.  We did discuss the risks of all of these with this particular medication.  She voiced good understanding and will follow up as needed  Start time: 4:27pm End time: 4:32pm  Total time spent on patient care (including telephone call/ virtual visit): 5 minutes  Justa Hatchell Hulen Skains, DO [facility] [phone]

## 2020-11-27 DIAGNOSIS — N183 Chronic kidney disease, stage 3 unspecified: Secondary | ICD-10-CM | POA: Diagnosis not present

## 2020-11-27 DIAGNOSIS — Z882 Allergy status to sulfonamides status: Secondary | ICD-10-CM | POA: Diagnosis not present

## 2020-11-27 DIAGNOSIS — Z79899 Other long term (current) drug therapy: Secondary | ICD-10-CM | POA: Diagnosis not present

## 2020-11-27 DIAGNOSIS — I2510 Atherosclerotic heart disease of native coronary artery without angina pectoris: Secondary | ICD-10-CM | POA: Diagnosis not present

## 2020-11-27 DIAGNOSIS — I129 Hypertensive chronic kidney disease with stage 1 through stage 4 chronic kidney disease, or unspecified chronic kidney disease: Secondary | ICD-10-CM | POA: Diagnosis not present

## 2020-11-27 DIAGNOSIS — Z7901 Long term (current) use of anticoagulants: Secondary | ICD-10-CM | POA: Diagnosis not present

## 2020-11-27 DIAGNOSIS — I890 Lymphedema, not elsewhere classified: Secondary | ICD-10-CM | POA: Diagnosis not present

## 2020-11-27 DIAGNOSIS — Z880 Allergy status to penicillin: Secondary | ICD-10-CM | POA: Diagnosis not present

## 2020-11-27 DIAGNOSIS — J449 Chronic obstructive pulmonary disease, unspecified: Secondary | ICD-10-CM | POA: Diagnosis not present

## 2020-11-29 NOTE — Progress Notes (Deleted)
Cardiology Office Note  Date: 11/29/2020   ID: Courtney Berry, DOB October 26, 1933, MRN [id number]  PCP:  Gwenlyn Fudge, FNP  Cardiologist:  Nona Dell, MD Electrophysiologist:  None   Chief Complaint: Follow-up atrial fibrillation, HTN, lymphedema  History of Present Illness: Courtney Berry is a 87 y.o. female with a history of atrial fibrillation, HTN, lymphedema, COPD, HLD, RA  Last seen by Dr. Diona Browner 07/11/2020.  No reported recurring chest pain.  Having occasional palpitations but not progressive.  Using compression treatment for lymphedema.  Weight was down a few pounds from previous visit in April.  She was continuing her Eliquis, Toprol-XL and lisinopril.  Past Medical History:  Diagnosis Date  . COPD (chronic obstructive pulmonary disease) ([facility])   . Diverticulosis   . Essential hypertension   . History of chest pain    Low risk Cardiolite 2010  . Lymphedema   . Mixed hyperlipidemia   . PAF (paroxysmal atrial fibrillation) ([facility])   . Rheumatoid arthritis [facility]     Past Surgical History:  Procedure Laterality Date  . TOTAL ABDOMINAL HYSTERECTOMY  1969    Current Outpatient Medications  Medication Sig Dispense Refill  . acetaminophen (TYLENOL) 500 MG tablet Take 1,000 mg by mouth every 6 (six) hours as needed for moderate pain.    . Bismuth Tribromoph-Petrolatum (XEROFORM PETROLAT GAUZE 5"X9") MISC Apply 1 application topically daily. (both legs) 30 each 2  . ciprofloxacin (CIPRO) 500 MG tablet Take 1 tablet (500 mg total) by mouth 2 (two) times daily. 20 tablet 0  . DULoxetine (CYMBALTA) 30 MG capsule Take 1 capsule (30 mg total) by mouth daily. 30 capsule 2  . ELIQUIS 5 MG TABS tablet TAKE 1 TABLET BY MOUTH TWICE A DAY 60 tablet 11  . fluticasone (FLONASE) 50 MCG/ACT nasal spray Place 2 sprays into both nostrils daily. 16 g 6  . lisinopril (ZESTRIL) 20 MG tablet TAKE 1 TABLET BY MOUTH EVERY DAY 90 tablet 2  . metoprolol succinate (TOPROL-XL) 25 MG 24 hr  tablet TAKE 1 AND 1/2 TABLETS BY MOUTH TWICE DAILY 270 tablet 2  . mirtazapine (REMERON) 7.5 MG tablet Take 1 tablet (7.5 mg total) by mouth at bedtime. 30 tablet 2  . nystatin (MYCOSTATIN) 100000 UNIT/ML suspension Take 5 mLs (500,000 Units total) by mouth 4 (four) times daily. 120 mL 0  . PROAIR HFA 108 (90 Base) MCG/ACT inhaler Inhale 1-2 puffs into the lungs every 6 (six) hours as needed for wheezing or shortness of breath. 18 g 0  . umeclidinium-vilanterol (ANORO ELLIPTA) 62.5-25 MCG/INH AEPB Inhale 1 puff into the lungs as needed.      No current facility-administered medications for this visit.   Allergies:  Darvocet [propoxyphene n-acetaminophen], Doxycycline, Nitrofurantoin monohyd macro, Omnicef [cefdinir], Plavix [clopidogrel bisulfate], Sulfa antibiotics, Amitriptyline, Gabapentin, and Penicillins   Social History: The patient  reports that she has never smoked. She has never used smokeless tobacco. She reports that she does not drink alcohol and does not use drugs.   Family History: The patient's family history includes Cancer in her brother and mother; Colon cancer in her father; Heart disease in her sister.   ROS:  Please see the history of present illness. Otherwise, complete review of systems is positive for {NONE DEFAULTED:18576::"none"}.  All other systems are reviewed and negative.   Physical Exam: VS:  There were no vitals taken for this visit., BMI There is no height or weight on file to calculate BMI.  Wt Readings from  Last 3 Encounters:  07/11/20 206 lb 12.8 oz (93.8 kg)  03/17/20 278 lb (126.1 kg)  03/01/20 208 lb (94.3 kg)    General: Patient appears comfortable at rest. HEENT: Conjunctiva and lids normal, oropharynx clear with moist mucosa. Neck: Supple, no elevated JVP or carotid bruits, no thyromegaly. Lungs: Clear to auscultation, nonlabored breathing at rest. Cardiac: Regular rate and rhythm, no S3 or significant systolic murmur, no pericardial  rub. Abdomen: Soft, nontender, no hepatomegaly, bowel sounds present, no guarding or rebound. Extremities: No pitting edema, distal pulses 2+. Skin: Warm and dry. Musculoskeletal: No kyphosis. Neuropsychiatric: Alert and oriented x3, affect grossly appropriate.  ECG:  {EKG/Telemetry Strips Reviewed:2125172690}  Recent Labwork: 08/29/2020: ALT 6; AST 9; BUN 13; Creatinine, Ser 0.99; Hemoglobin 10.9; Platelets 183; Potassium 4.1; Sodium 143     Component Value Date/Time   CHOL 141 08/29/2020 0835   TRIG 118 08/29/2020 0835   HDL 50 08/29/2020 0835   CHOLHDL 2.8 08/29/2020 0835   CHOLHDL 3.7 08/02/2018 0417   VLDL 38 08/02/2018 0417   LDLCALC 70 08/29/2020 0835    Other Studies Reviewed Today:  Eugenie Birks Myoview 08/30/2018:  There was no ST segment deviation noted during stress.  The study is normal. No ischemia or scar.  This is a low risk study.  Nuclear stress EF: 77%.  Echocardiogram 08/02/2018: Study Conclusions   - Left ventricle: The cavity size was normal. Wall thickness was  increased increased in a pattern of mild to moderate LVH.  Systolic function was normal. The estimated ejection fraction was  in the range of 60% to 65%. Wall motion was normal; there were no  regional wall motion abnormalities. Doppler parameters are  consistent with abnormal left ventricular relaxation (grade 1  diastolic dysfunction).  - Aortic valve: Moderately calcified annulus. Trileaflet.  - Mitral valve: Mildly calcified annulus. There was trivial  regurgitation.  - Right atrium: Central venous pressure (est): 3 mm Hg.  - Atrial septum: No defect or patent foramen ovale was identified.  - Tricuspid valve: There was trivial regurgitation.  - Pulmonary arteries: Systolic pressure could not be accurately  estimated.  - Pericardium, extracardiac: There was no pericardial effusion.   Assessment and Plan:  1. Paroxysmal atrial fibrillation (HCC)   2. Chronic  acquired lymphedema   3. Essential hypertension      Medication Adjustments/Labs and Tests Ordered: Current medicines are reviewed at length with the patient today.  Concerns regarding medicines are outlined above.   Disposition: Follow-up with ***  Signed, Rennis Harding, NP 11/29/2020 9:34 AM    [facility] [address] [place], [place], [place] [address] Phone: [phone]; Fax: [phone]

## 2020-11-30 ENCOUNTER — Ambulatory Visit: Payer: Medicare Other | Admitting: Family Medicine

## 2020-11-30 DIAGNOSIS — I890 Lymphedema, not elsewhere classified: Secondary | ICD-10-CM

## 2020-11-30 DIAGNOSIS — I10 Essential (primary) hypertension: Secondary | ICD-10-CM

## 2020-11-30 DIAGNOSIS — I480 Paroxysmal atrial fibrillation: Secondary | ICD-10-CM

## 2020-12-07 ENCOUNTER — Ambulatory Visit: Payer: Medicare Other | Admitting: Family Medicine

## 2020-12-13 NOTE — Progress Notes (Deleted)
Cardiology Office Note  Date: 12/13/2020   ID: Courtney Berry, DOB [birth year], MRN [id number]  PCP:  Gwenlyn Fudge, FNP  Cardiologist:  Nona Dell, MD Electrophysiologist:  None   Chief Complaint: Follow-up atrial fibrillation, HTN, lymphedema.  History of Present Illness: Courtney Berry is a 87 y.o. female with a history of atrial fibrillation, HTN, lymphedema, COPD, HLD, RA  Last seen by Dr. Diona Browner 07/11/2020.  No reported recurring chest pain.  Having occasional palpitations but not progressive.  Using compression treatment for lymphedema.  Weight was down a few pounds from previous visit in April.  She was continuing her Eliquis, Toprol-XL and lisinopril.  Past Medical History:  Diagnosis Date  . COPD (chronic obstructive pulmonary disease) (HCC)   . Diverticulosis   . Essential hypertension   . History of chest pain    Low risk Cardiolite 2010  . Lymphedema   . Mixed hyperlipidemia   . PAF (paroxysmal atrial fibrillation) (HCC)   . Rheumatoid arthritis [facility])     Past Surgical History:  Procedure Laterality Date  . TOTAL ABDOMINAL HYSTERECTOMY  1969    Current Outpatient Medications  Medication Sig Dispense Refill  . acetaminophen (TYLENOL) 500 MG tablet Take 1,000 mg by mouth every 6 (six) hours as needed for moderate pain.    . Bismuth Tribromoph-Petrolatum (XEROFORM PETROLAT GAUZE 5"X9") MISC Apply 1 application topically daily. (both legs) 30 each 2  . ciprofloxacin (CIPRO) 500 MG tablet Take 1 tablet (500 mg total) by mouth 2 (two) times daily. 20 tablet 0  . DULoxetine (CYMBALTA) 30 MG capsule Take 1 capsule (30 mg total) by mouth daily. 30 capsule 2  . ELIQUIS 5 MG TABS tablet TAKE 1 TABLET BY MOUTH TWICE A DAY 60 tablet 11  . fluticasone (FLONASE) 50 MCG/ACT nasal spray Place 2 sprays into both nostrils daily. 16 g 6  . lisinopril (ZESTRIL) 20 MG tablet TAKE 1 TABLET BY MOUTH EVERY DAY 90 tablet 2  . metoprolol succinate (TOPROL-XL) 25 MG 24 hr  tablet TAKE 1 AND 1/2 TABLETS BY MOUTH TWICE DAILY 270 tablet 2  . mirtazapine (REMERON) 7.5 MG tablet Take 1 tablet (7.5 mg total) by mouth at bedtime. 30 tablet 2  . nystatin (MYCOSTATIN) 100000 UNIT/ML suspension Take 5 mLs (500,000 Units total) by mouth 4 (four) times daily. 120 mL 0  . PROAIR HFA 108 (90 Base) MCG/ACT inhaler Inhale 1-2 puffs into the lungs every 6 (six) hours as needed for wheezing or shortness of breath. 18 g 0  . umeclidinium-vilanterol (ANORO ELLIPTA) 62.5-25 MCG/INH AEPB Inhale 1 puff into the lungs as needed.      No current facility-administered medications for this visit.   Allergies:  Darvocet [propoxyphene n-acetaminophen], Doxycycline, Nitrofurantoin monohyd macro, Omnicef [cefdinir], Plavix [clopidogrel bisulfate], Sulfa antibiotics, Amitriptyline, Gabapentin, and Penicillins   Social History: The patient  reports that she has never smoked. She has never used smokeless tobacco. She reports that she does not drink alcohol and does not use drugs.   Family History: The patient's family history includes Cancer in her brother and mother; Colon cancer in her father; Heart disease in her sister.   ROS:  Please see the history of present illness. Otherwise, complete review of systems is positive for none.  All other systems are reviewed and negative.   Physical Exam: VS:  There were no vitals taken for this visit., BMI There is no height or weight on file to calculate BMI.  Wt Readings from Last  3 Encounters:  07/11/20 206 lb 12.8 oz (93.8 kg)  03/17/20 278 lb (126.1 kg)  03/01/20 208 lb (94.3 kg)    General: Patient appears comfortable at rest. HEENT: Conjunctiva and lids normal, oropharynx clear with moist mucosa. Neck: Supple, no elevated JVP or carotid bruits, no thyromegaly. Lungs: Clear to auscultation, nonlabored breathing at rest. Cardiac: Regular rate and rhythm, no S3 or significant systolic murmur, no pericardial rub. Abdomen: Soft, nontender, no  hepatomegaly, bowel sounds present, no guarding or rebound. Extremities: No pitting edema, distal pulses 2+. Skin: Warm and dry. Musculoskeletal: No kyphosis. Neuropsychiatric: Alert and oriented x3, affect grossly appropriate.  ECG:  {EKG/Telemetry Strips Reviewed:[id number]}  Recent Labwork: 08/29/2020: ALT 6; AST 9; BUN 13; Creatinine, Ser 0.99; Hemoglobin 10.9; Platelets 183; Potassium 4.1; Sodium 143     Component Value Date/Time   CHOL 141 08/29/2020 0835   TRIG 118 08/29/2020 0835   HDL 50 08/29/2020 0835   CHOLHDL 2.8 08/29/2020 0835   CHOLHDL 3.7 08/02/2018 0417   VLDL 38 08/02/2018 0417   LDLCALC 70 08/29/2020 0835    Other Studies Reviewed Today:   Eugenie Birks Myoview 08/30/2018:  There was no ST segment deviation noted during stress.  The study is normal. No ischemia or scar.  This is a low risk study.  Nuclear stress EF: 77%.  Echocardiogram 08/02/2018: Study Conclusions   - Left ventricle: The cavity size was normal. Wall thickness was  increased increased in a pattern of mild to moderate LVH.  Systolic function was normal. The estimated ejection fraction was  in the range of 60% to 65%. Wall motion was normal; there were no  regional wall motion abnormalities. Doppler parameters are  consistent with abnormal left ventricular relaxation (grade 1  diastolic dysfunction).  - Aortic valve: Moderately calcified annulus. Trileaflet.  - Mitral valve: Mildly calcified annulus. There was trivial  regurgitation.  - Right atrium: Central venous pressure (est): 3 mm Hg.  - Atrial septum: No defect or patent foramen ovale was identified.  - Tricuspid valve: There was trivial regurgitation.  - Pulmonary arteries: Systolic pressure could not be accurately  estimated.  - Pericardium, extracardiac: There was no pericardial effusion.   Assessment and Plan:  1. Paroxysmal atrial fibrillation (HCC)   2. Essential hypertension   3. Lymphedema       Medication Adjustments/Labs and Tests Ordered: Current medicines are reviewed at length with the patient today.  Concerns regarding medicines are outlined above.   Disposition: Follow-up with ***  Signed, Rennis Harding, NP 12/13/2020 1:45 PM    [facility] at [facility] [address] [place], [place], [place] [address] Phone: [phone]; Fax: [phone]

## 2020-12-14 ENCOUNTER — Ambulatory Visit: Payer: Medicare Other | Admitting: Family Medicine

## 2020-12-14 DIAGNOSIS — I10 Essential (primary) hypertension: Secondary | ICD-10-CM

## 2020-12-14 DIAGNOSIS — I480 Paroxysmal atrial fibrillation: Secondary | ICD-10-CM

## 2020-12-14 DIAGNOSIS — I890 Lymphedema, not elsewhere classified: Secondary | ICD-10-CM

## 2020-12-18 DIAGNOSIS — I890 Lymphedema, not elsewhere classified: Secondary | ICD-10-CM | POA: Diagnosis not present

## 2020-12-18 DIAGNOSIS — I129 Hypertensive chronic kidney disease with stage 1 through stage 4 chronic kidney disease, or unspecified chronic kidney disease: Secondary | ICD-10-CM | POA: Diagnosis not present

## 2020-12-18 DIAGNOSIS — I2510 Atherosclerotic heart disease of native coronary artery without angina pectoris: Secondary | ICD-10-CM | POA: Diagnosis not present

## 2020-12-18 DIAGNOSIS — S81801D Unspecified open wound, right lower leg, subsequent encounter: Secondary | ICD-10-CM | POA: Diagnosis not present

## 2020-12-18 DIAGNOSIS — J449 Chronic obstructive pulmonary disease, unspecified: Secondary | ICD-10-CM | POA: Diagnosis not present

## 2020-12-18 DIAGNOSIS — Z79899 Other long term (current) drug therapy: Secondary | ICD-10-CM | POA: Diagnosis not present

## 2020-12-18 DIAGNOSIS — N183 Chronic kidney disease, stage 3 unspecified: Secondary | ICD-10-CM | POA: Diagnosis not present

## 2020-12-26 DIAGNOSIS — I890 Lymphedema, not elsewhere classified: Secondary | ICD-10-CM | POA: Diagnosis not present

## 2020-12-26 NOTE — Progress Notes (Signed)
Cardiology Office Note  Date: 12/28/2020   ID: Courtney Berry, DOB 1933/01/29, MRN [id number]  PCP:  Gwenlyn Fudge, FNP  Cardiologist:  Nona Dell, MD Electrophysiologist:  None   Chief Complaint: Follow-up atrial fibrillation, HTN, lymphedema.  History of Present Illness: Courtney Berry is a 87 y.o. female with a history of atrial fibrillation, HTN, lymphedema, COPD, HLD, RA  Last seen by Dr. Diona Browner 07/11/2020.  No reported recurring chest pain.  Having occasional palpitations but not progressive.  Using compression treatment for lymphedema.  Weight was down a few pounds from previous visit in April.  She was continuing her Eliquis, Toprol-XL and lisinopril.  Past Medical History:  Diagnosis Date   COPD (chronic obstructive pulmonary disease) (HCC)    Diverticulosis    Essential hypertension    History of chest pain    Low risk Cardiolite 2010   Lymphedema    Mixed hyperlipidemia    PAF (paroxysmal atrial fibrillation) (HCC)    Rheumatoid arthritis (HCC)     Past Surgical History:  Procedure Laterality Date   TOTAL ABDOMINAL HYSTERECTOMY  1969    Current Outpatient Medications  Medication Sig Dispense Refill   acetaminophen (TYLENOL) 500 MG tablet Take 1,000 mg by mouth every 6 (six) hours as needed for moderate pain.     Bismuth Tribromoph-Petrolatum (XEROFORM PETROLAT GAUZE 5"X9") MISC Apply 1 application topically daily. (both legs) 30 each 2   DULoxetine (CYMBALTA) 30 MG capsule Take 1 capsule (30 mg total) by mouth daily. 30 capsule 2   ELIQUIS 5 MG TABS tablet TAKE 1 TABLET BY MOUTH TWICE A DAY 60 tablet 11   fluticasone (FLONASE) 50 MCG/ACT nasal spray Place 2 sprays into both nostrils daily. 16 g 6   lisinopril (ZESTRIL) 20 MG tablet TAKE 1 TABLET BY MOUTH EVERY DAY 90 tablet 2   metoprolol succinate (TOPROL-XL) 25 MG 24 hr tablet TAKE 1 AND 1/2 TABLETS BY MOUTH TWICE DAILY 270 tablet 2   mirtazapine (REMERON) 7.5 MG tablet Take 1  tablet (7.5 mg total) by mouth at bedtime. 30 tablet 2   PROAIR HFA 108 (90 Base) MCG/ACT inhaler Inhale 1-2 puffs into the lungs every 6 (six) hours as needed for wheezing or shortness of breath. 18 g 0   Probiotic Product (PROBIOTIC DAILY PO) Take 1 tablet by mouth daily.     umeclidinium-vilanterol (ANORO ELLIPTA) 62.5-25 MCG/INH AEPB Inhale 1 puff into the lungs as needed.      No current facility-administered medications for this visit.   Allergies:  Darvocet [propoxyphene n-acetaminophen], Doxycycline, Nitrofurantoin monohyd macro, Omnicef [cefdinir], Plavix [clopidogrel bisulfate], Sulfa antibiotics, Amitriptyline, Gabapentin, and Penicillins   Social History: The patient  reports that she has never smoked. She has never used smokeless tobacco. She reports that she does not drink alcohol and does not use drugs.   Family History: The patient's family history includes Cancer in her brother and mother; Colon cancer in her father; Heart disease in her sister.   ROS:  Please see the history of present illness. Otherwise, complete review of systems is positive for none.  All other systems are reviewed and negative.   Physical Exam: VS:  BP 100/68    Pulse (!) 124    Ht 5\' 7"  (1.702 m)    Wt 201 lb 3.2 oz (91.3 kg)    BMI 31.51 kg/m , BMI Body mass index is 31.51 kg/m.  Wt Readings from Last 3 Encounters:  12/28/20 201 lb 3.2 oz (91.3  kg)  07/11/20 206 lb 12.8 oz (93.8 kg)  03/17/20 278 lb (126.1 kg)    General: Patient appears comfortable at rest. Neck: Supple, no elevated JVP or carotid bruits, no thyromegaly. Lungs: Clear to auscultation, nonlabored breathing at rest. Cardiac: irregularly irregular tachcardicrate and rhythm, no S3 or significant systolic murmur, no pericardial rub. Extremities: chronic lymphedema, distal pulses 2+. Skin: Warm and dry. Musculoskeletal: No kyphosis. Neuropsychiatric: Alert and oriented x3, affect grossly appropriate.  ECG:  An ECG dated  12/28/2020 was personally reviewed today and demonstrated:  atrial fibrillation with RVR rate of 124  Recent Labwork: 08/29/2020: ALT 6; AST 9; BUN 13; Creatinine, Ser 0.99; Hemoglobin 10.9; Platelets 183; Potassium 4.1; Sodium 143     Component Value Date/Time   CHOL 141 08/29/2020 0835   TRIG 118 08/29/2020 0835   HDL 50 08/29/2020 0835   CHOLHDL 2.8 08/29/2020 0835   CHOLHDL 3.7 08/02/2018 0417   VLDL 38 08/02/2018 0417   LDLCALC 70 08/29/2020 0835    Other Studies Reviewed Today:   Eugenie Birks Myoview 08/30/2018:  There was no ST segment deviation noted during stress.  The study is normal. No ischemia or scar.  This is a low risk study.  Nuclear stress EF: 77%.  Echocardiogram 08/02/2018: Study Conclusions   - Left ventricle: The cavity size was normal. Wall thickness was  increased increased in a pattern of mild to moderate LVH.  Systolic function was normal. The estimated ejection fraction was  in the range of 60% to 65%. Wall motion was normal; there were no  regional wall motion abnormalities. Doppler parameters are  consistent with abnormal left ventricular relaxation (grade 1  diastolic dysfunction).  - Aortic valve: Moderately calcified annulus. Trileaflet.  - Mitral valve: Mildly calcified annulus. There was trivial  regurgitation.  - Right atrium: Central venous pressure (est): 3 mm Hg.  - Atrial septum: No defect or patent foramen ovale was identified.  - Tricuspid valve: There was trivial regurgitation.  - Pulmonary arteries: Systolic pressure could not be accurately  estimated.  - Pericardium, extracardiac: There was no pericardial effusion.   Assessment and Plan:  1. Essential hypertension   2. Paroxysmal atrial fibrillation (HCC)   3. Lymphedema of both lower extremities    1. Essential hypertension BP today 100/68. Likely r/t Afib with RVR. Will temporarily stop Lisinopril until next Friday and reassess. Patient is  asymptomatic.  2. Paroxysmal atrial fibrillation (HCC) She states her heart rate rate has been elevated over the last few weeks. She was at the wound center recently and was told her heart was elevated  and to follow up with cardiology. EKG today demonstrates atrial fibrillation with RVR rate of 124. She is asymptomatic. BP is on the low side at 100/68. Stop Lisinopril for now. Increase Toprol XL to 50 mg to po bid until next Friday. Start checking daily blood pressures and record. Bring them with you at follow up. Call with any issues.  Return next Friday for nursing visit to recheck EKG.  3. Lymphedema of both lower extremities She [facility]. She is in a wheelchair today.    Medication Adjustments/Labs and Tests Ordered: Current medicines are reviewed at length with the patient today.  Concerns regarding medicines are outlined above.   Disposition:  Come back next Friday for nurse visit and repeat EKG / VS . Keep appointment with Dr Diona Browner on March 3  Signed, Rennis Harding, NP 12/28/2020 11:54 AM  [facility] [place] at [place], [place], [place] [address] Phone: [phone]; Fax: [phone]

## 2020-12-28 ENCOUNTER — Ambulatory Visit (INDEPENDENT_AMBULATORY_CARE_PROVIDER_SITE_OTHER): Payer: Medicare Other | Admitting: Family Medicine

## 2020-12-28 ENCOUNTER — Encounter: Payer: Self-pay | Admitting: Family Medicine

## 2020-12-28 VITALS — BP 100/68 | HR 124 | Ht 67.0 in | Wt 201.2 lb

## 2020-12-28 DIAGNOSIS — I890 Lymphedema, not elsewhere classified: Secondary | ICD-10-CM | POA: Diagnosis not present

## 2020-12-28 DIAGNOSIS — I10 Essential (primary) hypertension: Secondary | ICD-10-CM | POA: Diagnosis not present

## 2020-12-28 DIAGNOSIS — I480 Paroxysmal atrial fibrillation: Secondary | ICD-10-CM | POA: Diagnosis not present

## 2020-12-28 MED ORDER — METOPROLOL SUCCINATE ER 50 MG PO TB24: 50.0000 mg | ORAL_TABLET | Freq: Two times a day (BID) | ORAL | 6 refills | Status: DC

## 2020-12-28 NOTE — Patient Instructions (Addendum)
Medication Instructions:   Stop Lisinopril.  Increase Toprol to 50mg  twice a day.  Continue all other medications.    Labwork: none  Testing/Procedures: none  Follow-Up: Keep 01/17/2021 visit as scheduled with Dr. 03/19/2021  Any Other Special Instructions Will Be Listed Below (If Applicable). Nurse visit for EKG in 1 week.    If you need a refill on your cardiac medications before your next appointment, please call your pharmacy.

## 2020-12-28 NOTE — Addendum Note (Signed)
Addended by: Eliodoro Gullett T on: 12/28/2020 03:01 PM   Modules accepted: Orders  

## 2020-12-31 ENCOUNTER — Telehealth: Payer: Self-pay | Admitting: *Deleted

## 2020-12-31 DIAGNOSIS — R269 Unspecified abnormalities of gait and mobility: Secondary | ICD-10-CM

## 2020-12-31 DIAGNOSIS — R2689 Other abnormalities of gait and mobility: Secondary | ICD-10-CM

## 2020-12-31 NOTE — Telephone Encounter (Signed)
OT @[facility]  [place] [facility] would like a referral for pt to get physical therapy also for gait & balance, thi can be faxed to [phone]

## 2021-01-01 NOTE — Addendum Note (Signed)
Addended by: Gwenlyn Fudge on: 01/01/2021 08:28 AM   Modules accepted: Orders

## 2021-01-01 NOTE — Telephone Encounter (Signed)
Referral placed.

## 2021-01-02 DIAGNOSIS — I890 Lymphedema, not elsewhere classified: Secondary | ICD-10-CM | POA: Diagnosis not present

## 2021-01-03 DIAGNOSIS — I890 Lymphedema, not elsewhere classified: Secondary | ICD-10-CM | POA: Diagnosis not present

## 2021-01-04 ENCOUNTER — Telehealth: Payer: Self-pay | Admitting: *Deleted

## 2021-01-04 ENCOUNTER — Other Ambulatory Visit: Payer: Self-pay

## 2021-01-04 ENCOUNTER — Ambulatory Visit (INDEPENDENT_AMBULATORY_CARE_PROVIDER_SITE_OTHER): Payer: Medicare Other | Admitting: *Deleted

## 2021-01-04 DIAGNOSIS — I480 Paroxysmal atrial fibrillation: Secondary | ICD-10-CM | POA: Diagnosis not present

## 2021-01-04 MED ORDER — AMIODARONE HCL 200 MG PO TABS: 200.0000 mg | ORAL_TABLET | Freq: Two times a day (BID) | ORAL | 6 refills | Status: DC

## 2021-01-04 NOTE — Telephone Encounter (Signed)
Lesle Chris, LPN  8/56/3149 7:02 PM EST Back to Top     Patient & [name] (daughter) notified. She will begin the Amiodarone tomorrow. New prescription sent to [facility] now.

## 2021-01-04 NOTE — Progress Notes (Signed)
Blood pressure is looking very good.  Tell her to keep up the good work

## 2021-01-04 NOTE — Telephone Encounter (Signed)
-----   Message from Netta Neat., NP sent at 01/04/2021 10:43 AM EST ----- It looks like she is still in atrial fibrillation with RVR.  We need to slow the heart rate down.  Please start her on amiodarone 200 mg p.o. twice daily for the next week and have her come back for nurse visit for recheck of EKG and blood pressure.  Thank you

## 2021-01-04 NOTE — Progress Notes (Signed)
Patient in office this morning for EKG;    Seeing Dr. Diona Browner on 01/17/2021.  See below list of BP readings brought in -   Sat - 117/63  92 Sun - 124/73  94 Mon - 123/68  91 Tue - 124/73  90 Wed - 133/67  91 Thur - 131/72  98

## 2021-01-04 NOTE — Progress Notes (Signed)
Patient notified and verbalized understanding. 

## 2021-01-10 ENCOUNTER — Ambulatory Visit (HOSPITAL_COMMUNITY): Payer: Medicare Other | Admitting: Physician Assistant

## 2021-01-11 ENCOUNTER — Other Ambulatory Visit: Payer: Self-pay

## 2021-01-11 ENCOUNTER — Encounter (HOSPITAL_COMMUNITY): Payer: Self-pay | Admitting: Physician Assistant

## 2021-01-11 ENCOUNTER — Ambulatory Visit (HOSPITAL_COMMUNITY)
Admission: RE | Admit: 2021-01-11 | Discharge: 2021-01-11 | Disposition: A | Discharge status: Home / Self Care | Payer: Medicare Other | Source: Ambulatory Visit | Attending: Physician Assistant | Admitting: Physician Assistant

## 2021-01-11 VITALS — BP 112/88 | HR 122 | Ht 67.0 in | Wt 197.4 lb

## 2021-01-11 DIAGNOSIS — Z79899 Other long term (current) drug therapy: Secondary | ICD-10-CM | POA: Insufficient documentation

## 2021-01-11 DIAGNOSIS — Z6830 Body mass index (BMI) 30.0-30.9, adult: Secondary | ICD-10-CM | POA: Insufficient documentation

## 2021-01-11 DIAGNOSIS — I10 Essential (primary) hypertension: Secondary | ICD-10-CM | POA: Insufficient documentation

## 2021-01-11 DIAGNOSIS — Z888 Allergy status to other drugs, medicaments and biological substances status: Secondary | ICD-10-CM | POA: Diagnosis not present

## 2021-01-11 DIAGNOSIS — Z7901 Long term (current) use of anticoagulants: Secondary | ICD-10-CM | POA: Insufficient documentation

## 2021-01-11 DIAGNOSIS — Z881 Allergy status to other antibiotic agents status: Secondary | ICD-10-CM | POA: Diagnosis not present

## 2021-01-11 DIAGNOSIS — M069 Rheumatoid arthritis, unspecified: Secondary | ICD-10-CM | POA: Diagnosis not present

## 2021-01-11 DIAGNOSIS — Z880 Allergy status to penicillin: Secondary | ICD-10-CM | POA: Insufficient documentation

## 2021-01-11 DIAGNOSIS — D6869 Other thrombophilia: Secondary | ICD-10-CM | POA: Diagnosis not present

## 2021-01-11 DIAGNOSIS — E669 Obesity, unspecified: Secondary | ICD-10-CM | POA: Diagnosis not present

## 2021-01-11 DIAGNOSIS — J449 Chronic obstructive pulmonary disease, unspecified: Secondary | ICD-10-CM | POA: Diagnosis not present

## 2021-01-11 DIAGNOSIS — I4819 Other persistent atrial fibrillation: Secondary | ICD-10-CM | POA: Insufficient documentation

## 2021-01-11 DIAGNOSIS — Z8249 Family history of ischemic heart disease and other diseases of the circulatory system: Secondary | ICD-10-CM | POA: Insufficient documentation

## 2021-01-11 DIAGNOSIS — E785 Hyperlipidemia, unspecified: Secondary | ICD-10-CM | POA: Diagnosis not present

## 2021-01-11 NOTE — Progress Notes (Signed)
Primary Care Physician: Gwenlyn Fudge, FNP Primary Cardiologist: Dr Diona Browner  Primary Electrophysiologist: none Referring Physician: Rennis Harding   Courtney Berry is a 87 y.o. female with a history of HTN, lymphedema, COPD, HLD, RA, atrial fibrillation who presents for consultation in the [facility].  The patient was initially diagnosed with atrial fibrillation remotely and has been maintained on metoprolol.  Patient is on Eliquis for a CHADS2VASC score of 4. She saw Rennis Harding on 12/28/20 and was in afib with RVR. Her metoprolol was increased but she continued to have issues with elevated heart rates and she was started on amiodarone. Patient reports that she has felt improved with less dyspnea on exertion since starting amiodarone. She brings in heart rates from home which show 80s-90s bpm.   Today, she denies symptoms of palpitations, chest pain, orthopnea, PND, lower extremity edema, dizziness, presyncope, syncope, snoring, daytime somnolence, bleeding, or neurologic sequela. The patient is tolerating medications without difficulties and is otherwise without complaint today.    Atrial Fibrillation Risk Factors:  she does not have symptoms or diagnosis of sleep apnea. she does not have a history of rheumatic fever.   she has a BMI of Body mass index is 30.92 kg/m.Marland Kitchen Filed Weights   01/11/21 1139  Weight: 89.5 kg    Family History  Problem Relation Age of Onset  . Colon cancer Father   . Cancer Mother   . Heart disease Sister        CABG age 83  . Cancer Brother      Atrial Fibrillation Management history:  Previous antiarrhythmic drugs: amiodarone  Previous cardioversions: none Previous ablations: none CHADS2VASC score: 4 Anticoagulation history: Eliquis   Past Medical History:  Diagnosis Date  . COPD (chronic obstructive pulmonary disease) (HCC)   . Diverticulosis   . Essential hypertension   . History of chest pain    Low  risk Cardiolite 2010  . Lymphedema   . Mixed hyperlipidemia   . PAF (paroxysmal atrial fibrillation) (HCC)   . Rheumatoid arthritis [facility])    Past Surgical History:  Procedure Laterality Date  . TOTAL ABDOMINAL HYSTERECTOMY  1969    Current Outpatient Medications  Medication Sig Dispense Refill  . acetaminophen (TYLENOL) 500 MG tablet Take 1,000 mg by mouth every 6 (six) hours as needed for moderate pain.    Marland Kitchen amiodarone (PACERONE) 200 MG tablet Take 1 tablet (200 mg total) by mouth 2 (two) times daily. 60 tablet 6  . Bismuth Tribromoph-Petrolatum (XEROFORM PETROLAT GAUZE 5"X9") MISC Apply 1 application topically daily. (both legs) 30 each 2  . ELIQUIS 5 MG TABS tablet TAKE 1 TABLET BY MOUTH TWICE A DAY 60 tablet 11  . fluticasone (FLONASE) 50 MCG/ACT nasal spray Place 2 sprays into both nostrils daily. 16 g 6  . hydrochlorothiazide (HYDRODIURIL) 25 MG tablet Take 12.5 mg by mouth.    . metoprolol succinate (TOPROL-XL) 50 MG 24 hr tablet Take 1 tablet (50 mg total) by mouth 2 (two) times daily. 60 tablet 6  . PROAIR HFA 108 (90 Base) MCG/ACT inhaler Inhale 1-2 puffs into the lungs every 6 (six) hours as needed for wheezing or shortness of breath. 18 g 0  . Probiotic Product (PROBIOTIC DAILY PO) Take 1 tablet by mouth daily.    Marland Kitchen triamcinolone ointment (KENALOG) 0.1 % SMARTSIG:1 Application Topical 2-3 Times Daily    . umeclidinium-vilanterol (ANORO ELLIPTA) 62.5-25 MCG/INH AEPB Inhale 1 puff into the lungs as needed.  No current facility-administered medications for this encounter.    Allergies  Allergen Reactions  . Darvocet [Propoxyphene N-Acetaminophen] Other (See Comments)    unknown  . Doxycycline Itching  . Nitrofurantoin Monohyd Macro Other (See Comments)    unknown  . Omnicef [Cefdinir]     Sick, rash  . Plavix [Clopidogrel Bisulfate] Swelling    Face swells  . Sulfa Antibiotics Other (See Comments)    "face got puffy"  . Amitriptyline Other (See Comments)    Made  her "talk out of her head" and sleep until lunch the next day.  . Gabapentin Other (See Comments)    Made her "talk out of her head".  . Penicillins Rash    Has patient had a PCN reaction causing immediate rash, facial/tongue/throat swelling, SOB or lightheadedness with hypotension: Yes Has patient had a PCN reaction causing severe rash involving mucus membranes or skin necrosis: No Has patient had a PCN reaction that required hospitalization: No Has patient had a PCN reaction occurring within the last 10 years: No If all of the above answers are "NO", then may proceed with Cephalosporin use.     Social History   Socioeconomic History  . Marital status: Married    Spouse name: Not on file  . Number of children: Not on file  . Years of education: Not on file  . Highest education level: Not on file  Occupational History  . Occupation: Retired  Tobacco Use  . Smoking status: Never Smoker  . Smokeless tobacco: Never Used  . Tobacco comment: Tobacco use-no  Vaping Use  . Vaping Use: Never used  Substance and Sexual Activity  . Alcohol use: No    Alcohol/week: 0.0 standard drinks  . Drug use: No  . Sexual activity: Not on file  Other Topics Concern  . Not on file  Social History Narrative  . Not on file   Social Determinants of Health   Financial Resource Strain: Not on file  Food Insecurity: Not on file  Transportation Needs: Not on file  Physical Activity: Not on file  Stress: Not on file  Social Connections: Not on file  Intimate Partner Violence: Not on file     ROS- All systems are reviewed and negative except as per the HPI above.  Physical Exam: Vitals:   01/11/21 1139  BP: 112/88  Pulse: (!) 122  Weight: 89.5 kg  Height: 5\' 7"  (1.702 m)    GEN- The patient is well appearing obese elderly female, alert and oriented x 3 today.   Head- normocephalic, atraumatic Eyes-  Sclera clear, conjunctiva pink Ears- hearing intact Oropharynx- clear Neck- supple   Lungs- Clear to ausculation bilaterally, normal work of breathing Heart- irregular rate and rhythm, tachycardia, no murmurs, rubs or gallops  GI- soft, NT, ND, + BS Extremities- no clubbing, cyanosis. Wrapping for lymphedema.  MS- no significant deformity or atrophy Skin- no rash or lesion Psych- euthymic mood, full affect Neuro- strength and sensation are intact  Wt Readings from Last 3 Encounters:  01/11/21 89.5 kg  12/28/20 91.3 kg  07/11/20 93.8 kg    EKG today demonstrates  Afib  Vent. rate 122 BPM QRS duration 76 ms QT/QTc 356/507 ms  Echo 08/02/18 demonstrated  Left ventricle: The cavity size was normal. Wall thickness was  increased increased in a pattern of mild to moderate LVH.  Systolic function was normal. The estimated ejection fraction was  in the range of 60% to 65%. Wall motion was normal; there were  no  regional wall motion abnormalities. Doppler parameters are  consistent with abnormal left ventricular relaxation (grade 1  diastolic dysfunction).  - Aortic valve: Moderately calcified annulus. Trileaflet.  - Mitral valve: Mildly calcified annulus. There was trivial  regurgitation.  - Right atrium: Central venous pressure (est): 3 mm Hg.  - Atrial septum: No defect or patent foramen ovale was identified.  - Tricuspid valve: There was trivial regurgitation.  - Pulmonary arteries: Systolic pressure could not be accurately  estimated.  - Pericardium, extracardiac: There was no pericardial effusion.   Epic records are reviewed at length today  CHA2DS2-VASc Score = 4  The patient's score is based upon: CHF History: No HTN History: Yes Diabetes History: No Stroke History: No Vascular Disease History: No Age Score: 2 Gender Score: 1      ASSESSMENT AND PLAN: 1. Persistent Atrial Fibrillation (ICD10:  I48.19) The patient's CHA2DS2-VASc score is 4, indicating a 4.8% annual risk of stroke.   Patient appears in persistent afib. She has had  symptomatic improvement with amiodarone. Her heart rate at rest in office today in 90s.  Will plan for DCCV after 4 weeks of amiodarone loading given her symptomatic improvement. If she has clinical deterioration, would cardiovert sooner.  Continue Toprol 50 mg BID Continue amiodarone 200 mg BID Continue Eliquis 5 mg BID  2. Secondary Hypercoagulable State (ICD10:  D68.69) The patient is at significant risk for stroke/thromboembolism based upon her CHA2DS2-VASc Score of 4.  Continue Apixaban (Eliquis).   3. Obesity Body mass index is 30.92 kg/m. Lifestyle modification was discussed at length including regular exercise and weight reduction.  4. HTN Stable, no changes today.   Follow up with Dr Diona Browner as scheduled. AF clinic in 2-3 weeks to arrange DCCV.    Jorja Loa PA-C [facility] [address] [place], [place] [address] [phone] 01/11/2021 12:12 PM

## 2021-01-11 NOTE — Progress Notes (Signed)
Thanks Clint

## 2021-01-12 ENCOUNTER — Other Ambulatory Visit: Payer: Self-pay | Admitting: Family Medicine

## 2021-01-12 DIAGNOSIS — I890 Lymphedema, not elsewhere classified: Secondary | ICD-10-CM

## 2021-01-17 ENCOUNTER — Encounter: Payer: Self-pay | Admitting: Cardiology

## 2021-01-17 ENCOUNTER — Ambulatory Visit (INDEPENDENT_AMBULATORY_CARE_PROVIDER_SITE_OTHER): Payer: Medicare Other | Admitting: Cardiology

## 2021-01-17 VITALS — BP 102/68 | HR 105 | Ht 67.0 in | Wt 198.0 lb

## 2021-01-17 DIAGNOSIS — I890 Lymphedema, not elsewhere classified: Secondary | ICD-10-CM | POA: Diagnosis not present

## 2021-01-17 DIAGNOSIS — I10 Essential (primary) hypertension: Secondary | ICD-10-CM

## 2021-01-17 DIAGNOSIS — I4819 Other persistent atrial fibrillation: Secondary | ICD-10-CM | POA: Diagnosis not present

## 2021-01-17 NOTE — Progress Notes (Signed)
Cardiology Office Note  Date: 01/17/2021   ID: Courtney Berry, DOB [birth year], MRN [id number]  PCP:  Gwenlyn Fudge, FNP  Cardiologist:  Nona Dell, MD Electrophysiologist:  None   Chief Complaint  Patient presents with  . Cardiac follow-up    History of Present Illness: Courtney Berry is an 87 y.o. female seen just recently in the office by Mr. Courtney Hews NP, also in the atrial fibrillation clinic.  She has been managed for persistent atrial fibrillation, initially with RVR, but heart rate improvement following medication adjustments including plan for amiodarone load and ultimately cardioversion.  She is here today with her daughter for a follow-up visit.  She does not report any progressive symptomatology.  Heart rate has generally been under 100 on checks at home.  I personally reviewed her ECG today which shows atrial fibrillation 105 bpm, nonspecific ST-T changes.  She is tolerating her current medications well including recent initiation of amiodarone.  No bleeding problems on Eliquis.  Past Medical History:  Diagnosis Date  . COPD (chronic obstructive pulmonary disease) (HCC)   . Diverticulosis   . Essential hypertension   . History of chest pain    Low risk Cardiolite 2010  . Lymphedema   . Mixed hyperlipidemia   . PAF (paroxysmal atrial fibrillation) (HCC)   . Rheumatoid arthritis [facility])     Past Surgical History:  Procedure Laterality Date  . TOTAL ABDOMINAL HYSTERECTOMY  1969    Current Outpatient Medications  Medication Sig Dispense Refill  . acetaminophen (TYLENOL) 500 MG tablet Take 1,000 mg by mouth every 6 (six) hours as needed for moderate pain.    Marland Kitchen amiodarone (PACERONE) 200 MG tablet Take 1 tablet (200 mg total) by mouth 2 (two) times daily. 60 tablet 6  . Bismuth Tribromoph-Petrolatum (XEROFORM PETROLAT GAUZE 5"X9") MISC APPLY 1 APPLICATION TOPICALLY DAILY. (BOTH LEGS) 30 each 2  . ELIQUIS 5 MG TABS tablet TAKE 1 TABLET BY MOUTH TWICE A DAY 60  tablet 11  . fluticasone (FLONASE) 50 MCG/ACT nasal spray Place 2 sprays into both nostrils daily. 16 g 6  . hydrochlorothiazide (HYDRODIURIL) 25 MG tablet Take 12.5 mg by mouth.    . metoprolol succinate (TOPROL-XL) 50 MG 24 hr tablet Take 1 tablet (50 mg total) by mouth 2 (two) times daily. 60 tablet 6  . PROAIR HFA 108 (90 Base) MCG/ACT inhaler Inhale 1-2 puffs into the lungs every 6 (six) hours as needed for wheezing or shortness of breath. 18 g 0  . Probiotic Product (PROBIOTIC DAILY PO) Take 1 tablet by mouth daily.    Marland Kitchen triamcinolone ointment (KENALOG) 0.1 % SMARTSIG:1 Application Topical 2-3 Times Daily    . umeclidinium-vilanterol (ANORO ELLIPTA) 62.5-25 MCG/INH AEPB Inhale 1 puff into the lungs as needed.      No current facility-administered medications for this visit.   Allergies:  Darvocet [propoxyphene n-acetaminophen], Doxycycline, Nitrofurantoin monohyd macro, Omnicef [cefdinir], Plavix [clopidogrel bisulfate], Sulfa antibiotics, Amitriptyline, Gabapentin, and Penicillins   ROS: No chest pain or syncope.  Physical Exam: VS:  BP 102/68   Pulse (!) 105   Ht 5\' 7"  (1.702 m)   Wt 198 lb (89.8 kg)   SpO2 98%   BMI 31.01 kg/m , BMI Body mass index is 31.01 kg/m.  Wt Readings from Last 3 Encounters:  01/17/21 198 lb (89.8 kg)  01/11/21 197 lb 6.4 oz (89.5 kg)  12/28/20 201 lb 3.2 oz (91.3 kg)    General: Patient appears comfortable at rest.  Seated in wheelchair. HEENT: Conjunctiva and lids normal, wearing a mask. Neck: Supple, no elevated JVP or carotid bruits, no thyromegaly. Lungs: Clear to auscultation, nonlabored breathing at rest. Cardiac: Irregularly irregular, no S3, soft systolic murmur, no pericardial rub. Extremities: No pitting edema.  ECG:  An ECG dated 01/11/2021 was personally reviewed today and demonstrated:  Atrial fibrillation with RVR, nonspecific ST-T changes.  Recent Labwork: 08/29/2020: ALT 6; AST 9; BUN 13; Creatinine, Ser 0.99; Hemoglobin 10.9;  Platelets 183; Potassium 4.1; Sodium 143     Component Value Date/Time   CHOL 141 08/29/2020 0835   TRIG 118 08/29/2020 0835   HDL 50 08/29/2020 0835   CHOLHDL 2.8 08/29/2020 0835   CHOLHDL 3.7 08/02/2018 0417   VLDL 38 08/02/2018 0417   LDLCALC 70 08/29/2020 0835    Other Studies Reviewed Today:  Echocardiogram 08/02/2018: - Left ventricle: The cavity size was normal. Wall thickness was  increased increased in a pattern of mild to moderate LVH.  Systolic function was normal. The estimated ejection fraction was  in the range of 60% to 65%. Wall motion was normal; there were no  regional wall motion abnormalities. Doppler parameters are  consistent with abnormal left ventricular relaxation (grade 1  diastolic dysfunction).  - Aortic valve: Moderately calcified annulus. Trileaflet.  - Mitral valve: Mildly calcified annulus. There was trivial  regurgitation.  - Right atrium: Central venous pressure (est): 3 mm Hg.  - Atrial septum: No defect or patent foramen ovale was identified.  - Tricuspid valve: There was trivial regurgitation.  - Pulmonary arteries: Systolic pressure could not be accurately  estimated.  - Pericardium, extracardiac: There was no pericardial effusion.   Assessment and Plan:  1.  Persistent atrial fibrillation with CHA2DS2-VASc score of 4.  She is on Eliquis for stroke prophylaxis as well as Toprol-XL and recent initiation of oral amiodarone load.  Heart rate is better and she is less symptomatic.  Keep scheduled follow-up in the atrial fibrillation clinic, if she remains in atrial fibrillation, DCCV can be scheduled.  2.  Chronic lymphedema.  Continue compression wraps.  3.  Essential hypertension, blood pressure well controlled today.  In addition to above regimen she is on HCTZ as well.  Medication Adjustments/Labs and Tests Ordered: Current medicines are reviewed at length with the patient today.  Concerns regarding medicines are outlined  above.   Tests Ordered: Orders Placed This Encounter  Procedures  . EKG 12-Lead    Medication Changes: No orders of the defined types were placed in this encounter.   Disposition:  Follow up 3 months.  Signed, Jonelle Sidle, MD, [facility] 01/17/2021 2:23 PM    [facility] [address] [place], [place], [place] [address] Phone: [phone]; Fax: [phone]

## 2021-01-17 NOTE — Patient Instructions (Addendum)
Medication Instructions:   Your physician recommends that you continue on your current medications as directed. Please refer to the Current Medication list given to you today.  Labwork:  none  Testing/Procedures:  none  Follow-Up:  Your physician recommends that you schedule a follow-up appointment in: 3 months.  Any Other Special Instructions Will Be Listed Below (If Applicable).  If you need a refill on your cardiac medications before your next appointment, please call your pharmacy. 

## 2021-01-22 ENCOUNTER — Telehealth: Payer: Self-pay | Admitting: Cardiology

## 2021-01-22 NOTE — Telephone Encounter (Signed)
Spoke to patient's daughter who needed clarification of patient's medications. Usually patient's husband is the one who manges patients medication, however he had a bypass and patient's daughter is managing medications for now. We went through the list of medications and how often they are taken daily. Pt's daughter verbalized understanding.

## 2021-01-22 NOTE — Telephone Encounter (Signed)
Mrs. Su Hilt called requesting to speak with someone in regards to patients medications. Please call [phone].

## 2021-01-23 ENCOUNTER — Telehealth (HOSPITAL_COMMUNITY): Payer: Self-pay | Admitting: *Deleted

## 2021-01-23 MED ORDER — AMIODARONE HCL 200 MG PO TABS: 200.0000 mg | ORAL_TABLET | Freq: Every day | ORAL | 6 refills | Status: DC

## 2021-01-23 MED ORDER — METOPROLOL SUCCINATE ER 50 MG PO TB24: 50.0000 mg | ORAL_TABLET | Freq: Every day | ORAL | 6 refills | Status: DC

## 2021-01-23 NOTE — Telephone Encounter (Signed)
Pt daughter called in stating yesterday pts HRs were in the 50-60s with dizziness pt did have fall from dizziness but did not injury herself or hit head.  Discussed with Jorja Loa PA will reduce amiodarone to 200mg  once a day and metoprolol to 50mg  once a day.  BP 148/89 HR 56 this morning.  Pt daughter verbalized understanding of instructions.

## 2021-01-28 ENCOUNTER — Ambulatory Visit (HOSPITAL_COMMUNITY): Payer: Medicare Other | Admitting: Physician Assistant

## 2021-02-04 ENCOUNTER — Ambulatory Visit (HOSPITAL_COMMUNITY)
Admission: RE | Admit: 2021-02-04 | Discharge: 2021-02-04 | Disposition: A | Discharge status: Home / Self Care | Payer: Medicare Other | Source: Ambulatory Visit | Attending: Physician Assistant | Admitting: Physician Assistant

## 2021-02-04 ENCOUNTER — Other Ambulatory Visit: Payer: Self-pay

## 2021-02-04 ENCOUNTER — Encounter (HOSPITAL_COMMUNITY): Payer: Self-pay | Admitting: Physician Assistant

## 2021-02-04 VITALS — BP 122/78 | HR 94 | Ht 67.0 in | Wt 199.8 lb

## 2021-02-04 DIAGNOSIS — M069 Rheumatoid arthritis, unspecified: Secondary | ICD-10-CM | POA: Insufficient documentation

## 2021-02-04 DIAGNOSIS — I4819 Other persistent atrial fibrillation: Secondary | ICD-10-CM

## 2021-02-04 DIAGNOSIS — D6869 Other thrombophilia: Secondary | ICD-10-CM | POA: Insufficient documentation

## 2021-02-04 DIAGNOSIS — E785 Hyperlipidemia, unspecified: Secondary | ICD-10-CM | POA: Diagnosis not present

## 2021-02-04 DIAGNOSIS — Z8249 Family history of ischemic heart disease and other diseases of the circulatory system: Secondary | ICD-10-CM | POA: Diagnosis not present

## 2021-02-04 DIAGNOSIS — Z882 Allergy status to sulfonamides status: Secondary | ICD-10-CM | POA: Diagnosis not present

## 2021-02-04 DIAGNOSIS — Z888 Allergy status to other drugs, medicaments and biological substances status: Secondary | ICD-10-CM | POA: Diagnosis not present

## 2021-02-04 DIAGNOSIS — Z7901 Long term (current) use of anticoagulants: Secondary | ICD-10-CM | POA: Insufficient documentation

## 2021-02-04 DIAGNOSIS — I10 Essential (primary) hypertension: Secondary | ICD-10-CM | POA: Insufficient documentation

## 2021-02-04 DIAGNOSIS — I890 Lymphedema, not elsewhere classified: Secondary | ICD-10-CM | POA: Insufficient documentation

## 2021-02-04 DIAGNOSIS — Z880 Allergy status to penicillin: Secondary | ICD-10-CM | POA: Diagnosis not present

## 2021-02-04 DIAGNOSIS — Z79899 Other long term (current) drug therapy: Secondary | ICD-10-CM | POA: Diagnosis not present

## 2021-02-04 DIAGNOSIS — E669 Obesity, unspecified: Secondary | ICD-10-CM | POA: Diagnosis not present

## 2021-02-04 DIAGNOSIS — J449 Chronic obstructive pulmonary disease, unspecified: Secondary | ICD-10-CM | POA: Diagnosis not present

## 2021-02-04 DIAGNOSIS — Z6831 Body mass index (BMI) 31.0-31.9, adult: Secondary | ICD-10-CM | POA: Diagnosis not present

## 2021-02-04 LAB — COMPREHENSIVE METABOLIC PANEL
ALT: 17 U/L (ref 0–44)
AST: 22 U/L (ref 15–41)
Albumin: 2.9 g/dL — ABNORMAL LOW (ref 3.5–5.0)
Alkaline Phosphatase: 102 U/L (ref 38–126)
Anion gap: 7 (ref 5–15)
BUN: 11 mg/dL (ref 8–23)
CO2: 29 mmol/L (ref 22–32)
Calcium: 8.9 mg/dL (ref 8.9–10.3)
Chloride: 103 mmol/L (ref 98–111)
Creatinine, Ser: 1.01 mg/dL — ABNORMAL HIGH (ref 0.44–1.00)
GFR, Estimated: 54 mL/min — ABNORMAL LOW (ref >60)
Glucose, Bld: 102 mg/dL — ABNORMAL HIGH (ref 70–99)
Potassium: 4.4 mmol/L (ref 3.5–5.1)
Sodium: 139 mmol/L (ref 135–145)
Total Bilirubin: 0.8 mg/dL (ref 0.3–1.2)
Total Protein: 6.8 g/dL (ref 6.5–8.1)

## 2021-02-04 LAB — CBC
HCT: 39.1 % (ref 36.0–46.0)
Hemoglobin: 11.7 g/dL — ABNORMAL LOW (ref 12.0–15.0)
MCH: 28.4 pg (ref 26.0–34.0)
MCHC: 29.9 g/dL — ABNORMAL LOW (ref 30.0–36.0)
MCV: 94.9 fL (ref 80.0–100.0)
Platelets: 188 10*3/uL (ref 150–400)
RBC: 4.12 MIL/uL (ref 3.87–5.11)
RDW: 14.4 % (ref 11.5–15.5)
WBC: 9 10*3/uL (ref 4.0–10.5)
nRBC: 0 % (ref 0.0–0.2)

## 2021-02-04 LAB — TSH: TSH: 3.798 u[IU]/mL (ref 0.350–4.500)

## 2021-02-04 NOTE — Progress Notes (Signed)
Primary Care Physician: Gwenlyn Fudge, FNP Primary Cardiologist: Dr Diona Browner  Primary Electrophysiologist: none Referring Physician: Rennis Harding   Courtney Berry is a 87 y.o. female with a history of HTN, lymphedema, COPD, HLD, RA, atrial fibrillation who presents for consultation in the [facility].  The patient was initially diagnosed with atrial fibrillation remotely and has been maintained on metoprolol.  Patient is on Eliquis for a CHADS2VASC score of 4. She saw Rennis Harding on 12/28/20 and was in afib with RVR. Her metoprolol was increased but she continued to have issues with elevated heart rates and she was started on amiodarone.   On follow up today, patient reports she is doing well today. She did have dizziness since her last visit and her BB and amiodarone were reduced. Patient noted higher heart rates and self resumed the BID dose of BB. She reports that she feels well overall and is able to walk around the hose without becoming SOB. She denies any bleeding issues on anticoagulation.   Today, she denies symptoms of palpitations, chest pain, orthopnea, PND, lower extremity edema, presyncope, syncope, snoring, daytime somnolence, bleeding, or neurologic sequela. The patient is tolerating medications without difficulties and is otherwise without complaint today.    Atrial Fibrillation Risk Factors:  she does not have symptoms or diagnosis of sleep apnea. she does not have a history of rheumatic fever.   she has a BMI of Body mass index is 31.29 kg/m.Marland Kitchen Filed Weights   02/04/21 1439  Weight: 90.6 kg    Family History  Problem Relation Age of Onset  . Colon cancer Father   . Cancer Mother   . Heart disease Sister        CABG age 59  . Cancer Brother      Atrial Fibrillation Management history:  Previous antiarrhythmic drugs: amiodarone  Previous cardioversions: none Previous ablations: none CHADS2VASC score: 4 Anticoagulation  history: Eliquis   Past Medical History:  Diagnosis Date  . COPD (chronic obstructive pulmonary disease) (HCC)   . Diverticulosis   . Essential hypertension   . History of chest pain    Low risk Cardiolite 2010  . Lymphedema   . Mixed hyperlipidemia   . PAF (paroxysmal atrial fibrillation) (HCC)   . Rheumatoid arthritis [facility])    Past Surgical History:  Procedure Laterality Date  . TOTAL ABDOMINAL HYSTERECTOMY  1969    Current Outpatient Medications  Medication Sig Dispense Refill  . acetaminophen (TYLENOL) 500 MG tablet Take 1,000 mg by mouth every 6 (six) hours as needed for moderate pain.    Marland Kitchen amiodarone (PACERONE) 200 MG tablet Take 1 tablet (200 mg total) by mouth daily. 60 tablet 6  . Bismuth Tribromoph-Petrolatum (XEROFORM PETROLAT GAUZE 5"X9") MISC APPLY 1 APPLICATION TOPICALLY DAILY. (BOTH LEGS) 30 each 2  . ELIQUIS 5 MG TABS tablet TAKE 1 TABLET BY MOUTH TWICE A DAY 60 tablet 11  . fluticasone (FLONASE) 50 MCG/ACT nasal spray Place 2 sprays into both nostrils daily. 16 g 6  . hydrochlorothiazide (HYDRODIURIL) 25 MG tablet Take 12.5 mg by mouth.    . metoprolol succinate (TOPROL-XL) 50 MG 24 hr tablet Take 1 tablet (50 mg total) by mouth daily. 60 tablet 6  . PROAIR HFA 108 (90 Base) MCG/ACT inhaler Inhale 1-2 puffs into the lungs every 6 (six) hours as needed for wheezing or shortness of breath. 18 g 0  . Probiotic Product (PROBIOTIC DAILY PO) Take 1 tablet by mouth daily.    Marland Kitchen  triamcinolone ointment (KENALOG) 0.1 % SMARTSIG:1 Application Topical 2-3 Times Daily    . umeclidinium-vilanterol (ANORO ELLIPTA) 62.5-25 MCG/INH AEPB Inhale 1 puff into the lungs as needed.      No current facility-administered medications for this encounter.    Allergies  Allergen Reactions  . Darvocet [Propoxyphene N-Acetaminophen] Other (See Comments)    unknown  . Doxycycline Itching  . Nitrofurantoin Monohyd Macro Other (See Comments)    unknown  . Omnicef [Cefdinir]     Sick, rash   . Plavix [Clopidogrel Bisulfate] Swelling    Face swells  . Sulfa Antibiotics Other (See Comments)    "face got puffy"  . Amitriptyline Other (See Comments)    Made her "talk out of her head" and sleep until lunch the next day.  . Gabapentin Other (See Comments)    Made her "talk out of her head".  . Penicillins Rash    Has patient had a PCN reaction causing immediate rash, facial/tongue/throat swelling, SOB or lightheadedness with hypotension: Yes Has patient had a PCN reaction causing severe rash involving mucus membranes or skin necrosis: No Has patient had a PCN reaction that required hospitalization: No Has patient had a PCN reaction occurring within the last 10 years: No If all of the above answers are "NO", then may proceed with Cephalosporin use.     Social History   Socioeconomic History  . Marital status: Married    Spouse name: Not on file  . Number of children: Not on file  . Years of education: Not on file  . Highest education level: Not on file  Occupational History  . Occupation: Retired  Tobacco Use  . Smoking status: Never Smoker  . Smokeless tobacco: Never Used  . Tobacco comment: Tobacco use-no  Vaping Use  . Vaping Use: Never used  Substance and Sexual Activity  . Alcohol use: No    Alcohol/week: 0.0 standard drinks  . Drug use: No  . Sexual activity: Not on file  Other Topics Concern  . Not on file  Social History Narrative  . Not on file   Social Determinants of Health   Financial Resource Strain: Not on file  Food Insecurity: Not on file  Transportation Needs: Not on file  Physical Activity: Not on file  Stress: Not on file  Social Connections: Not on file  Intimate Partner Violence: Not on file     ROS- All systems are reviewed and negative except as per the HPI above.  Physical Exam: Vitals:   02/04/21 1439  BP: 122/78  Pulse: 94  Weight: 90.6 kg  Height: 5\' 7"  (1.702 m)    GEN- The patient is a well appearing obese elderly  female, alert and oriented x 3 today.   HEENT-head normocephalic, atraumatic, sclera clear, conjunctiva pink, hearing intact, trachea midline. Lungs- Clear to ausculation bilaterally, normal work of breathing Heart- irregular rate and rhythm, no murmurs, rubs or gallops  GI- soft, NT, ND, + BS Extremities- no clubbing, cyanosis, or edema MS- no significant deformity or atrophy Skin- no rash or lesion Psych- euthymic mood, full affect Neuro- strength and sensation are intact   Wt Readings from Last 3 Encounters:  02/04/21 90.6 kg  01/17/21 89.8 kg  01/11/21 89.5 kg    EKG today demonstrates  Afib Vent. rate 94 BPM PR interval * ms QRS duration 80 ms QT/QTc 380/475 ms  Echo 08/02/18 demonstrated  Left ventricle: The cavity size was normal. Wall thickness was  increased increased in a pattern  of mild to moderate LVH.  Systolic function was normal. The estimated ejection fraction was  in the range of 60% to 65%. Wall motion was normal; there were no  regional wall motion abnormalities. Doppler parameters are  consistent with abnormal left ventricular relaxation (grade 1  diastolic dysfunction).  - Aortic valve: Moderately calcified annulus. Trileaflet.  - Mitral valve: Mildly calcified annulus. There was trivial  regurgitation.  - Right atrium: Central venous pressure (est): 3 mm Hg.  - Atrial septum: No defect or patent foramen ovale was identified.  - Tricuspid valve: There was trivial regurgitation.  - Pulmonary arteries: Systolic pressure could not be accurately  estimated.  - Pericardium, extracardiac: There was no pericardial effusion.   Epic records are reviewed at length today  CHA2DS2-VASc Score = 4  The patient's score is based upon: CHF History: No HTN History: Yes Diabetes History: No Stroke History: No Vascular Disease History: No Age Score: 2 Gender Score: 1      ASSESSMENT AND PLAN: 1. Persistent Atrial Fibrillation (ICD10:   I48.19) The patient's CHA2DS2-VASc score is 4, indicating a 4.8% annual risk of stroke.   Patient remains in rate controlled afib. Will plan for DCCV. Check Cmet/TSH/CBC Continue Toprol 50 mg BID Continue amiodarone 200 mg daily Continue Eliquis 5 mg BID  2. Secondary Hypercoagulable State (ICD10:  D68.69) The patient is at significant risk for stroke/thromboembolism based upon her CHA2DS2-VASc Score of 4.  Continue Apixaban (Eliquis).   3. Obesity Body mass index is 31.29 kg/m. Lifestyle modification was discussed and encouraged including regular physical activity and weight reduction.  4. HTN Stable, no changes today.   Follow up in the AF clinic one week post DCCV.    Jorja Loa PA-C Afib Clinic [facility] [address] [place], [place] [address] [phone] 02/04/2021 3:39 PM

## 2021-02-04 NOTE — Patient Instructions (Signed)
Cardioversion scheduled for Monday, April 4th  - Arrive at the [facility] and go to admitting at [facility]  - Do not eat or drink anything after midnight the night prior to your procedure.  - Take all your morning medication (except diabetic medications) with a sip of water prior to arrival.  - You will not be able to drive home after your procedure.  - Do NOT miss any doses of your blood thinner - if you should miss a dose please notify our office immediately.  - If you feel as if you go back into normal rhythm prior to scheduled cardioversion, please notify our office immediately. If your procedure is canceled in the cardioversion suite you will be charged a cancellation fee.

## 2021-02-15 ENCOUNTER — Other Ambulatory Visit (HOSPITAL_COMMUNITY): Payer: Self-pay | Admitting: *Deleted

## 2021-02-15 DIAGNOSIS — I4819 Other persistent atrial fibrillation: Secondary | ICD-10-CM

## 2021-02-16 ENCOUNTER — Other Ambulatory Visit (HOSPITAL_COMMUNITY): Payer: Medicare Other

## 2021-02-18 ENCOUNTER — Ambulatory Visit (HOSPITAL_COMMUNITY): Admission: RE | Admit: 2021-02-18 | Payer: Medicare Other | Source: Home / Self Care | Admitting: Internal Medicine

## 2021-02-18 ENCOUNTER — Encounter (HOSPITAL_COMMUNITY): Admission: RE | Payer: Self-pay | Source: Home / Self Care

## 2021-02-18 SURGERY — CARDIOVERSION
Anesthesia: General

## 2021-02-19 ENCOUNTER — Ambulatory Visit (INDEPENDENT_AMBULATORY_CARE_PROVIDER_SITE_OTHER): Payer: Medicare Other | Admitting: Family Medicine

## 2021-02-19 ENCOUNTER — Other Ambulatory Visit: Payer: Self-pay

## 2021-02-19 ENCOUNTER — Ambulatory Visit: Payer: Medicare Other

## 2021-02-19 ENCOUNTER — Encounter: Payer: Self-pay | Admitting: Family Medicine

## 2021-02-19 VITALS — BP 202/78 | HR 55 | Temp 97.5°F

## 2021-02-19 DIAGNOSIS — M25512 Pain in left shoulder: Secondary | ICD-10-CM

## 2021-02-19 DIAGNOSIS — J449 Chronic obstructive pulmonary disease, unspecified: Secondary | ICD-10-CM | POA: Diagnosis not present

## 2021-02-19 DIAGNOSIS — I4819 Other persistent atrial fibrillation: Secondary | ICD-10-CM

## 2021-02-19 DIAGNOSIS — I10 Essential (primary) hypertension: Secondary | ICD-10-CM

## 2021-02-19 DIAGNOSIS — M25511 Pain in right shoulder: Secondary | ICD-10-CM | POA: Diagnosis not present

## 2021-02-19 LAB — CBC
Hematocrit: 34.6 % (ref 34.0–46.6)
Hemoglobin: 10.9 g/dL — ABNORMAL LOW (ref 11.1–15.9)
MCH: 28.4 pg (ref 26.6–33.0)
MCHC: 31.5 g/dL (ref 31.5–35.7)
MCV: 90 fL (ref 79–97)
Platelets: 171 10*3/uL (ref 150–450)
RBC: 3.84 x10E6/uL (ref 3.77–5.28)
RDW: 13.8 % (ref 11.7–15.4)
WBC: 8.2 10*3/uL (ref 3.4–10.8)

## 2021-02-19 MED ORDER — LISINOPRIL 10 MG PO TABS: 10.0000 mg | ORAL_TABLET | Freq: Every day | ORAL | 2 refills | Status: DC

## 2021-02-19 NOTE — Progress Notes (Signed)
Assessment & Plan:  1. Persistent atrial fibrillation [facility]) Cardioversion scheduled later this month. - CBC - Basic metabolic panel  2. Essential hypertension Uncontrolled.  Advised patient to resume lisinopril but at a lower dosage due to previous soft blood pressures.  Rx'd 10 mg once daily, which is a decrease from 20 mg once daily. - lisinopril (ZESTRIL) 10 MG tablet; Take 1 tablet (10 mg total) by mouth daily.  Dispense: 30 tablet; Refill: 2  3. Chronic obstructive pulmonary disease, unspecified COPD type (HCC) Discussed with patient that her Anoro should be used once daily and her albuterol used as needed.  4. Acute pain of left shoulder Exercises provided for patient to do at home.  Encouraged Tylenol 1000 mg 3 times a day as well as Lidoderm patches. - DG Shoulder Left   Return in about 6 weeks (around 04/02/2021) for HTN.  Courtney Boston, MSN, APRN, FNP-C [facility]  Subjective:    Patient ID: Courtney Berry, female    DOB: May 14, 1933, 87 y.o.   MRN: [id number]  Patient Care Team: Gwenlyn Fudge, FNP as PCP - General (Family Medicine) Jonelle Sidle, MD as PCP - Cardiology (Cardiology) Kari Baars, MD as Consulting Physician (Pulmonary Disease) Corbin Ade, MD as Consulting Physician (Gastroenterology)   Chief Complaint:  Chief Complaint  Patient presents with  . Medical Management of Chronic Issues    Check up of chronic medical conditions   . Shoulder Pain    Left should pain after fall X4 wks     HPI: Courtney Berry is a 87 y.o. female presenting on 02/19/2021 for Medical Management of Chronic Issues (Check up of chronic medical conditions ) and Shoulder Pain (Left should pain after fall X4 wks )  Patient is accompanied by her daughter who she is okay with being present.  A-Fib: Patient is scheduled for a cardioversion later this month and requests lab work be completed while she is here today for her  cardiologist.  Hypertension: BP elevated today.  Patient reports her blood pressure at home recently has been mostly 165/55-60.  Her lisinopril was discontinued by the cardiologist due to soft blood pressures.  She was previously taking lisinopril 20 mg once daily.  COPD: Patient is not using her Anoro inhaler daily, but only when she feels she needs it.  She does report she feels she has been needing it more recently.  New complaints: Patient reports a fall onto her left shoulder 4 weeks ago.  They report it was felt the fall was due to a higher dose of amiodarone which has since been decreased.  She has been taking Tylenol 1000 mg twice daily which is not very effective for the pain.  She is also been applying Aspercreme which does help a little.  She applied a heating pad one time and states it messed up her breathing.  She describes the pain as constant pressure which she rates 10 out of 10.  Social history:  Relevant past medical, surgical, family and social history reviewed and updated as indicated. Interim medical history since our last visit reviewed.  Allergies and medications reviewed and updated.  DATA REVIEWED: CHART IN EPIC  ROS: Negative unless specifically indicated above in HPI.    Current Outpatient Medications:  .  acetaminophen (TYLENOL) 500 MG tablet, Take 1,000 mg by mouth every 6 (six) hours as needed for moderate pain., Disp: , Rfl:  .  amiodarone (PACERONE) 200 MG tablet, Take 1 tablet (200  mg total) by mouth daily., Disp: 60 tablet, Rfl: 6 .  Bismuth Tribromoph-Petrolatum (XEROFORM PETROLAT GAUZE 5"X9") MISC, APPLY 1 APPLICATION TOPICALLY DAILY. (BOTH LEGS), Disp: 30 each, Rfl: 2 .  DULoxetine (CYMBALTA) 30 MG capsule, Take 30 mg by mouth daily., Disp: , Rfl:  .  ELIQUIS 5 MG TABS tablet, TAKE 1 TABLET BY MOUTH TWICE A DAY (Patient taking differently: Take 5 mg by mouth 2 (two) times daily.), Disp: 60 tablet, Rfl: 11 .  fluticasone (FLONASE) 50 MCG/ACT nasal spray,  Place 2 sprays into both nostrils daily., Disp: 16 g, Rfl: 6 .  metoprolol succinate (TOPROL-XL) 50 MG 24 hr tablet, Take 1 tablet (50 mg total) by mouth daily. (Patient taking differently: Take 50 mg by mouth in the morning and at bedtime.), Disp: 60 tablet, Rfl: 6 .  PROAIR HFA 108 (90 Base) MCG/ACT inhaler, Inhale 1-2 puffs into the lungs every 6 (six) hours as needed for wheezing or shortness of breath., Disp: 18 g, Rfl: 0 .  Probiotic Product (PROBIOTIC DAILY PO), Take 1 tablet by mouth daily., Disp: , Rfl:  .  umeclidinium-vilanterol (ANORO ELLIPTA) 62.5-25 MCG/INH AEPB, Inhale 1 puff into the lungs daily as needed (asthma)., Disp: , Rfl:    Allergies  Allergen Reactions  . Darvocet [Propoxyphene N-Acetaminophen] Other (See Comments)    unknown  . Doxycycline Itching  . Nitrofurantoin Monohyd Macro Other (See Comments)    unknown  . Plavix [Clopidogrel Bisulfate] Swelling    Face swells  . Sulfa Antibiotics Other (See Comments)    "face got puffy"  . Amitriptyline Other (See Comments)    Made her "talk out of her head" and sleep until lunch the next day.  . Gabapentin Other (See Comments)    Made her "talk out of her head".  Truman Hayward [Cefdinir] Nausea And Vomiting and Rash  . Penicillins Rash    Has patient had a PCN reaction causing immediate rash, facial/tongue/throat swelling, SOB or lightheadedness with hypotension: Yes Has patient had a PCN reaction causing severe rash involving mucus membranes or skin necrosis: No Has patient had a PCN reaction that required hospitalization: No Has patient had a PCN reaction occurring within the last 10 years: No If all of the above answers are "NO", then may proceed with Cephalosporin use.    Past Medical History:  Diagnosis Date  . COPD (chronic obstructive pulmonary disease) (HCC)   . Diverticulosis   . Essential hypertension   . History of chest pain    Low risk Cardiolite 2010  . Lymphedema   . Mixed hyperlipidemia   . PAF  (paroxysmal atrial fibrillation) (HCC)   . Rheumatoid arthritis [facility])     Past Surgical History:  Procedure Laterality Date  . TOTAL ABDOMINAL HYSTERECTOMY  1969    Social History   Socioeconomic History  . Marital status: Married    Spouse name: Not on file  . Number of children: Not on file  . Years of education: Not on file  . Highest education level: Not on file  Occupational History  . Occupation: Retired  Tobacco Use  . Smoking status: Never Smoker  . Smokeless tobacco: Never Used  . Tobacco comment: Tobacco use-no  Vaping Use  . Vaping Use: Never used  Substance and Sexual Activity  . Alcohol use: No    Alcohol/week: 0.0 standard drinks  . Drug use: No  . Sexual activity: Not on file  Other Topics Concern  . Not on file  Social History Narrative  .  Not on file   Social Determinants of Health   Financial Resource Strain: Not on file  Food Insecurity: Not on file  Transportation Needs: Not on file  Physical Activity: Not on file  Stress: Not on file  Social Connections: Not on file  Intimate Partner Violence: Not on file        Objective:    BP (!) 202/78 Comment: wrist  Pulse (!) 55   Temp (!) 97.5 F (36.4 C) (Temporal)   SpO2 94%   Wt Readings from Last 3 Encounters:  02/04/21 199 lb 12.8 oz (90.6 kg)  01/17/21 198 lb (89.8 kg)  01/11/21 197 lb 6.4 oz (89.5 kg)    Physical Exam Vitals reviewed.  Constitutional:      General: She is not in acute distress.    Appearance: Normal appearance. She is not ill-appearing, toxic-appearing or diaphoretic.  HENT:     Head: Normocephalic and atraumatic.  Eyes:     General: No scleral icterus.       Right eye: No discharge.        Left eye: No discharge.     Conjunctiva/sclera: Conjunctivae normal.  Cardiovascular:     Rate and Rhythm: Normal rate. Rhythm irregular.     Heart sounds: Normal heart sounds. No murmur heard. No friction rub. No gallop.   Pulmonary:     Effort: Pulmonary effort is  normal. No respiratory distress.     Breath sounds: Normal breath sounds. No stridor. No wheezing, rhonchi or rales.  Musculoskeletal:        General: Normal range of motion.     Left shoulder: Tenderness (posteriorly) present. No swelling, deformity, effusion, laceration, bony tenderness or crepitus. Normal range of motion. Normal strength. Normal pulse.     Cervical back: Normal range of motion.  Skin:    General: Skin is warm and dry.     Capillary Refill: Capillary refill takes less than 2 seconds.  Neurological:     General: No focal deficit present.     Mental Status: She is alert and oriented to person, place, and time. Mental status is at baseline.  Psychiatric:        Mood and Affect: Mood normal.        Behavior: Behavior normal.        Thought Content: Thought content normal.        Judgment: Judgment normal.     Lab Results  Component Value Date   TSH 3.798 02/04/2021   Lab Results  Component Value Date   WBC 9.0 02/04/2021   HGB 11.7 (L) 02/04/2021   HCT 39.1 02/04/2021   MCV 94.9 02/04/2021   PLT 188 02/04/2021   Lab Results  Component Value Date   NA 139 02/04/2021   K 4.4 02/04/2021   CO2 29 02/04/2021   GLUCOSE 102 (H) 02/04/2021   BUN 11 02/04/2021   CREATININE 1.01 (H) 02/04/2021   BILITOT 0.8 02/04/2021   ALKPHOS 102 02/04/2021   AST 22 02/04/2021   ALT 17 02/04/2021   PROT 6.8 02/04/2021   ALBUMIN 2.9 (L) 02/04/2021   CALCIUM 8.9 02/04/2021   ANIONGAP 7 02/04/2021   Lab Results  Component Value Date   CHOL 141 08/29/2020   Lab Results  Component Value Date   HDL 50 08/29/2020   Lab Results  Component Value Date   LDLCALC 70 08/29/2020   Lab Results  Component Value Date   TRIG 118 08/29/2020   Lab Results  Component  Value Date   CHOLHDL 2.8 08/29/2020   No results found for: HGBA1C

## 2021-02-19 NOTE — Addendum Note (Signed)
Addended by: Meliton Rattan on: 02/19/2021 11:08 AM   Modules accepted: Orders

## 2021-02-19 NOTE — Addendum Note (Signed)
Addended by: Meliton Rattan on: 02/19/2021 11:06 AM   Modules accepted: Orders

## 2021-02-19 NOTE — H&P (View-Only) (Signed)
Assessment & Plan:  1. Persistent atrial fibrillation [facility]) Cardioversion scheduled later this month. - CBC - Basic metabolic panel  2. Essential hypertension Uncontrolled.  Advised patient to resume lisinopril but at a lower dosage due to previous soft blood pressures.  Rx'd 10 mg once daily, which is a decrease from 20 mg once daily. - lisinopril (ZESTRIL) 10 MG tablet; Take 1 tablet (10 mg total) by mouth daily.  Dispense: 30 tablet; Refill: 2  3. Chronic obstructive pulmonary disease, unspecified COPD type (HCC) Discussed with patient that her Anoro should be used once daily and her albuterol used as needed.  4. Acute pain of left shoulder Exercises provided for patient to do at home.  Encouraged Tylenol 1000 mg 3 times a day as well as Lidoderm patches. - DG Shoulder Left   Return in about 6 weeks (around 04/02/2021) for HTN.  Deliah Boston, MSN, APRN, FNP-C [facility]  Subjective:    Patient ID: Courtney Berry, female    DOB: May 14, 1933, 87 y.o.   MRN: [id number]  Patient Care Team: Gwenlyn Fudge, FNP as PCP - General (Family Medicine) Jonelle Sidle, MD as PCP - Cardiology (Cardiology) Kari Baars, MD as Consulting Physician (Pulmonary Disease) Corbin Ade, MD as Consulting Physician (Gastroenterology)   Chief Complaint:  Chief Complaint  Patient presents with  . Medical Management of Chronic Issues    Check up of chronic medical conditions   . Shoulder Pain    Left should pain after fall X4 wks     HPI: Courtney Berry is a 87 y.o. female presenting on 02/19/2021 for Medical Management of Chronic Issues (Check up of chronic medical conditions ) and Shoulder Pain (Left should pain after fall X4 wks )  Patient is accompanied by her daughter who she is okay with being present.  A-Fib: Patient is scheduled for a cardioversion later this month and requests lab work be completed while she is here today for her  cardiologist.  Hypertension: BP elevated today.  Patient reports her blood pressure at home recently has been mostly 165/55-60.  Her lisinopril was discontinued by the cardiologist due to soft blood pressures.  She was previously taking lisinopril 20 mg once daily.  COPD: Patient is not using her Anoro inhaler daily, but only when she feels she needs it.  She does report she feels she has been needing it more recently.  New complaints: Patient reports a fall onto her left shoulder 4 weeks ago.  They report it was felt the fall was due to a higher dose of amiodarone which has since been decreased.  She has been taking Tylenol 1000 mg twice daily which is not very effective for the pain.  She is also been applying Aspercreme which does help a little.  She applied a heating pad one time and states it messed up her breathing.  She describes the pain as constant pressure which she rates 10 out of 10.  Social history:  Relevant past medical, surgical, family and social history reviewed and updated as indicated. Interim medical history since our last visit reviewed.  Allergies and medications reviewed and updated.  DATA REVIEWED: CHART IN EPIC  ROS: Negative unless specifically indicated above in HPI.    Current Outpatient Medications:  .  acetaminophen (TYLENOL) 500 MG tablet, Take 1,000 mg by mouth every 6 (six) hours as needed for moderate pain., Disp: , Rfl:  .  amiodarone (PACERONE) 200 MG tablet, Take 1 tablet (200  mg total) by mouth daily., Disp: 60 tablet, Rfl: 6 .  Bismuth Tribromoph-Petrolatum (XEROFORM PETROLAT GAUZE 5"X9") MISC, APPLY 1 APPLICATION TOPICALLY DAILY. (BOTH LEGS), Disp: 30 each, Rfl: 2 .  DULoxetine (CYMBALTA) 30 MG capsule, Take 30 mg by mouth daily., Disp: , Rfl:  .  ELIQUIS 5 MG TABS tablet, TAKE 1 TABLET BY MOUTH TWICE A DAY (Patient taking differently: Take 5 mg by mouth 2 (two) times daily.), Disp: 60 tablet, Rfl: 11 .  fluticasone (FLONASE) 50 MCG/ACT nasal spray,  Place 2 sprays into both nostrils daily., Disp: 16 g, Rfl: 6 .  metoprolol succinate (TOPROL-XL) 50 MG 24 hr tablet, Take 1 tablet (50 mg total) by mouth daily. (Patient taking differently: Take 50 mg by mouth in the morning and at bedtime.), Disp: 60 tablet, Rfl: 6 .  PROAIR HFA 108 (90 Base) MCG/ACT inhaler, Inhale 1-2 puffs into the lungs every 6 (six) hours as needed for wheezing or shortness of breath., Disp: 18 g, Rfl: 0 .  Probiotic Product (PROBIOTIC DAILY PO), Take 1 tablet by mouth daily., Disp: , Rfl:  .  umeclidinium-vilanterol (ANORO ELLIPTA) 62.5-25 MCG/INH AEPB, Inhale 1 puff into the lungs daily as needed (asthma)., Disp: , Rfl:    Allergies  Allergen Reactions  . Darvocet [Propoxyphene N-Acetaminophen] Other (See Comments)    unknown  . Doxycycline Itching  . Nitrofurantoin Monohyd Macro Other (See Comments)    unknown  . Plavix [Clopidogrel Bisulfate] Swelling    Face swells  . Sulfa Antibiotics Other (See Comments)    "face got puffy"  . Amitriptyline Other (See Comments)    Made her "talk out of her head" and sleep until lunch the next day.  . Gabapentin Other (See Comments)    Made her "talk out of her head".  Truman Hayward [Cefdinir] Nausea And Vomiting and Rash  . Penicillins Rash    Has patient had a PCN reaction causing immediate rash, facial/tongue/throat swelling, SOB or lightheadedness with hypotension: Yes Has patient had a PCN reaction causing severe rash involving mucus membranes or skin necrosis: No Has patient had a PCN reaction that required hospitalization: No Has patient had a PCN reaction occurring within the last 10 years: No If all of the above answers are "NO", then may proceed with Cephalosporin use.    Past Medical History:  Diagnosis Date  . COPD (chronic obstructive pulmonary disease) (HCC)   . Diverticulosis   . Essential hypertension   . History of chest pain    Low risk Cardiolite 2010  . Lymphedema   . Mixed hyperlipidemia   . PAF  (paroxysmal atrial fibrillation) (HCC)   . Rheumatoid arthritis [facility])     Past Surgical History:  Procedure Laterality Date  . TOTAL ABDOMINAL HYSTERECTOMY  1969    Social History   Socioeconomic History  . Marital status: Married    Spouse name: Not on file  . Number of children: Not on file  . Years of education: Not on file  . Highest education level: Not on file  Occupational History  . Occupation: Retired  Tobacco Use  . Smoking status: Never Smoker  . Smokeless tobacco: Never Used  . Tobacco comment: Tobacco use-no  Vaping Use  . Vaping Use: Never used  Substance and Sexual Activity  . Alcohol use: No    Alcohol/week: 0.0 standard drinks  . Drug use: No  . Sexual activity: Not on file  Other Topics Concern  . Not on file  Social History Narrative  .  Not on file   Social Determinants of Health   Financial Resource Strain: Not on file  Food Insecurity: Not on file  Transportation Needs: Not on file  Physical Activity: Not on file  Stress: Not on file  Social Connections: Not on file  Intimate Partner Violence: Not on file        Objective:    BP (!) 202/78 Comment: wrist  Pulse (!) 55   Temp (!) 97.5 F (36.4 C) (Temporal)   SpO2 94%   Wt Readings from Last 3 Encounters:  02/04/21 199 lb 12.8 oz (90.6 kg)  01/17/21 198 lb (89.8 kg)  01/11/21 197 lb 6.4 oz (89.5 kg)    Physical Exam Vitals reviewed.  Constitutional:      General: She is not in acute distress.    Appearance: Normal appearance. She is not ill-appearing, toxic-appearing or diaphoretic.  HENT:     Head: Normocephalic and atraumatic.  Eyes:     General: No scleral icterus.       Right eye: No discharge.        Left eye: No discharge.     Conjunctiva/sclera: Conjunctivae normal.  Cardiovascular:     Rate and Rhythm: Normal rate. Rhythm irregular.     Heart sounds: Normal heart sounds. No murmur heard. No friction rub. No gallop.   Pulmonary:     Effort: Pulmonary effort is  normal. No respiratory distress.     Breath sounds: Normal breath sounds. No stridor. No wheezing, rhonchi or rales.  Musculoskeletal:        General: Normal range of motion.     Left shoulder: Tenderness (posteriorly) present. No swelling, deformity, effusion, laceration, bony tenderness or crepitus. Normal range of motion. Normal strength. Normal pulse.     Cervical back: Normal range of motion.  Skin:    General: Skin is warm and dry.     Capillary Refill: Capillary refill takes less than 2 seconds.  Neurological:     General: No focal deficit present.     Mental Status: She is alert and oriented to person, place, and time. Mental status is at baseline.  Psychiatric:        Mood and Affect: Mood normal.        Behavior: Behavior normal.        Thought Content: Thought content normal.        Judgment: Judgment normal.     Lab Results  Component Value Date   TSH 3.798 02/04/2021   Lab Results  Component Value Date   WBC 9.0 02/04/2021   HGB 11.7 (L) 02/04/2021   HCT 39.1 02/04/2021   MCV 94.9 02/04/2021   PLT 188 02/04/2021   Lab Results  Component Value Date   NA 139 02/04/2021   K 4.4 02/04/2021   CO2 29 02/04/2021   GLUCOSE 102 (H) 02/04/2021   BUN 11 02/04/2021   CREATININE 1.01 (H) 02/04/2021   BILITOT 0.8 02/04/2021   ALKPHOS 102 02/04/2021   AST 22 02/04/2021   ALT 17 02/04/2021   PROT 6.8 02/04/2021   ALBUMIN 2.9 (L) 02/04/2021   CALCIUM 8.9 02/04/2021   ANIONGAP 7 02/04/2021   Lab Results  Component Value Date   CHOL 141 08/29/2020   Lab Results  Component Value Date   HDL 50 08/29/2020   Lab Results  Component Value Date   LDLCALC 70 08/29/2020   Lab Results  Component Value Date   TRIG 118 08/29/2020   Lab Results  Component   Value Date   CHOLHDL 2.8 08/29/2020   No results found for: HGBA1C

## 2021-02-19 NOTE — Patient Instructions (Signed)
Lidoderm patches Tylenol 1,000 mg three times per day   Shoulder Exercises Ask your health care provider which exercises are safe for you. Do exercises exactly as told by your health care provider and adjust them as directed. It is normal to feel mild stretching, pulling, tightness, or discomfort as you do these exercises. Stop right away if you feel sudden pain or your pain gets worse. Do not begin these exercises until told by your health care provider. Stretching exercises External rotation and abduction This exercise is sometimes called corner stretch. This exercise rotates your arm outward (external rotation) and moves your arm out from your body (abduction). 1. Stand in a doorway with one of your feet slightly in front of the other. This is called a staggered stance. If you cannot reach your forearms to the door frame, stand facing a corner of a room. 2. Choose one of the following positions as told by your health care provider: ? Place your hands and forearms on the door frame above your head. ? Place your hands and forearms on the door frame at the height of your head. ? Place your hands on the door frame at the height of your elbows. 3. Slowly move your weight onto your front foot until you feel a stretch across your chest and in the front of your shoulders. Keep your head and chest upright and keep your abdominal muscles tight. 4. Hold for __________ seconds. 5. To release the stretch, shift your weight to your back foot. Repeat __________ times. Complete this exercise __________ times a day.   Extension, standing 1. Stand and hold a broomstick, a cane, or a similar object behind your back. ? Your hands should be a little wider than shoulder width apart. ? Your palms should face away from your back. 2. Keeping your elbows straight and your shoulder muscles relaxed, move the stick away from your body until you feel a stretch in your shoulders (extension). ? Avoid shrugging your  shoulders while you move the stick. Keep your shoulder blades tucked down toward the middle of your back. 3. Hold for __________ seconds. 4. Slowly return to the starting position. Repeat __________ times. Complete this exercise __________ times a day. Range-of-motion exercises Pendulum 1. Stand near a wall or a surface that you can hold onto for balance. 2. Bend at the waist and let your left / right arm hang straight down. Use your other arm to support you. Keep your back straight and do not lock your knees. 3. Relax your left / right arm and shoulder muscles, and move your hips and your trunk so your left / right arm swings freely. Your arm should swing because of the motion of your body, not because you are using your arm or shoulder muscles. 4. Keep moving your hips and trunk so your arm swings in the following directions, as told by your health care provider: ? Side to side. ? Forward and backward. ? In clockwise and counterclockwise circles. 5. Continue each motion for __________ seconds, or for as long as told by your health care provider. 6. Slowly return to the starting position. Repeat __________ times. Complete this exercise __________ times a day.   Shoulder flexion, standing 1. Stand and hold a broomstick, a cane, or a similar object. Place your hands a little more than shoulder width apart on the object. Your left / right hand should be palm up, and your other hand should be palm down. 2. Keep your elbow straight and  your shoulder muscles relaxed. Push the stick up with your healthy arm to raise your left / right arm in front of your body, and then over your head until you feel a stretch in your shoulder (flexion). ? Avoid shrugging your shoulder while you raise your arm. Keep your shoulder blade tucked down toward the middle of your back. 3. Hold for __________ seconds. 4. Slowly return to the starting position. Repeat __________ times. Complete this exercise __________ times a  day.   Shoulder abduction, standing 1. Stand and hold a broomstick, a cane, or a similar object. Place your hands a little more than shoulder width apart on the object. Your left / right hand should be palm up, and your other hand should be palm down. 2. Keep your elbow straight and your shoulder muscles relaxed. Push the object across your body toward your left / right side. Raise your left / right arm to the side of your body (abduction) until you feel a stretch in your shoulder. ? Do not raise your arm above shoulder height unless your health care provider tells you to do that. ? If directed, raise your arm over your head. ? Avoid shrugging your shoulder while you raise your arm. Keep your shoulder blade tucked down toward the middle of your back. 3. Hold for __________ seconds. 4. Slowly return to the starting position. Repeat __________ times. Complete this exercise __________ times a day. Internal rotation 1. Place your left / right hand behind your back, palm up. 2. Use your other hand to dangle an exercise band, a towel, or a similar object over your shoulder. Grasp the band with your left / right hand so you are holding on to both ends. 3. Gently pull up on the band until you feel a stretch in the front of your left / right shoulder. The movement of your arm toward the center of your body is called internal rotation. ? Avoid shrugging your shoulder while you raise your arm. Keep your shoulder blade tucked down toward the middle of your back. 4. Hold for __________ seconds. 5. Release the stretch by letting go of the band and lowering your hands. Repeat __________ times. Complete this exercise __________ times a day.   Strengthening exercises External rotation 1. Sit in a stable chair without armrests. 2. Secure an exercise band to a stable object at elbow height on your left / right side. 3. Place a soft object, such as a folded towel or a small pillow, between your left / right upper  arm and your body to move your elbow about 4 inches (10 cm) away from your side. 4. Hold the end of the exercise band so it is tight and there is no slack. 5. Keeping your elbow pressed against the soft object, slowly move your forearm out, away from your abdomen (external rotation). Keep your body steady so only your forearm moves. 6. Hold for __________ seconds. 7. Slowly return to the starting position. Repeat __________ times. Complete this exercise __________ times a day.   Shoulder abduction 1. Sit in a stable chair without armrests, or stand up. 2. Hold a __________ weight in your left / right hand, or hold an exercise band with both hands. 3. Start with your arms straight down and your left / right palm facing in, toward your body. 4. Slowly lift your left / right hand out to your side (abduction). Do not lift your hand above shoulder height unless your health care provider tells you  that this is safe. ? Keep your arms straight. ? Avoid shrugging your shoulder while you do this movement. Keep your shoulder blade tucked down toward the middle of your back. 5. Hold for __________ seconds. 6. Slowly lower your arm, and return to the starting position. Repeat __________ times. Complete this exercise __________ times a day.   Shoulder extension 1. Sit in a stable chair without armrests, or stand up. 2. Secure an exercise band to a stable object in front of you so it is at shoulder height. 3. Hold one end of the exercise band in each hand. Your palms should face each other. 4. Straighten your elbows and lift your hands up to shoulder height. 5. Step back, away from the secured end of the exercise band, until the band is tight and there is no slack. 6. Squeeze your shoulder blades together as you pull your hands down to the sides of your thighs (extension). Stop when your hands are straight down by your sides. Do not let your hands go behind your body. 7. Hold for __________  seconds. 8. Slowly return to the starting position. Repeat __________ times. Complete this exercise __________ times a day. Shoulder row 1. Sit in a stable chair without armrests, or stand up. 2. Secure an exercise band to a stable object in front of you so it is at waist height. 3. Hold one end of the exercise band in each hand. Position your palms so that your thumbs are facing the ceiling (neutral position). 4. Bend each of your elbows to a 90-degree angle (right angle) and keep your upper arms at your sides. 5. Step back until the band is tight and there is no slack. 6. Slowly pull your elbows back behind you. 7. Hold for __________ seconds. 8. Slowly return to the starting position. Repeat __________ times. Complete this exercise __________ times a day. Shoulder press-ups 1. Sit in a stable chair that has armrests. Sit upright, with your feet flat on the floor. 2. Put your hands on the armrests so your elbows are bent and your fingers are pointing forward. Your hands should be about even with the sides of your body. 3. Push down on the armrests and use your arms to lift yourself off the chair. Straighten your elbows and lift yourself up as much as you comfortably can. ? Move your shoulder blades down, and avoid letting your shoulders move up toward your ears. ? Keep your feet on the ground. As you get stronger, your feet should support less of your body weight as you lift yourself up. 4. Hold for __________ seconds. 5. Slowly lower yourself back into the chair. Repeat __________ times. Complete this exercise __________ times a day.   Wall push-ups 1. Stand so you are facing a stable wall. Your feet should be about one arm-length away from the wall. 2. Lean forward and place your palms on the wall at shoulder height. 3. Keep your feet flat on the floor as you bend your elbows and lean forward toward the wall. 4. Hold for __________ seconds. 5. Straighten your elbows to push yourself  back to the starting position. Repeat __________ times. Complete this exercise __________ times a day.   This information is not intended to replace advice given to you by your health care provider. Make sure you discuss any questions you have with your health care provider. Document Revised: 02/25/2019 Document Reviewed: 12/03/2018 Elsevier Patient Education  2021 [employer].

## 2021-02-20 ENCOUNTER — Other Ambulatory Visit: Payer: Self-pay | Admitting: Family Medicine

## 2021-02-20 DIAGNOSIS — M25511 Pain in right shoulder: Secondary | ICD-10-CM

## 2021-02-20 LAB — BASIC METABOLIC PANEL
BUN/Creatinine Ratio: 12 (ref 12–28)
BUN: 13 mg/dL (ref 8–27)
CO2: 26 mmol/L (ref 20–29)
Calcium: 9.2 mg/dL (ref 8.7–10.3)
Chloride: 103 mmol/L (ref 96–106)
Creatinine, Ser: 1.11 mg/dL — ABNORMAL HIGH (ref 0.57–1.00)
Glucose: 95 mg/dL (ref 65–99)
Potassium: 4.9 mmol/L (ref 3.5–5.2)
Sodium: 143 mmol/L (ref 134–144)
eGFR: 48 mL/min/{1.73_m2} — ABNORMAL LOW (ref >59)

## 2021-02-21 ENCOUNTER — Ambulatory Visit (INDEPENDENT_AMBULATORY_CARE_PROVIDER_SITE_OTHER): Payer: Medicare Other

## 2021-02-21 ENCOUNTER — Other Ambulatory Visit: Payer: Self-pay

## 2021-02-21 DIAGNOSIS — M19012 Primary osteoarthritis, left shoulder: Secondary | ICD-10-CM | POA: Diagnosis not present

## 2021-02-21 DIAGNOSIS — M25512 Pain in left shoulder: Secondary | ICD-10-CM

## 2021-02-22 ENCOUNTER — Ambulatory Visit: Payer: Medicare Other | Admitting: Family Medicine

## 2021-02-25 ENCOUNTER — Telehealth: Payer: Self-pay

## 2021-02-25 ENCOUNTER — Ambulatory Visit (HOSPITAL_COMMUNITY): Payer: Medicare Other | Admitting: Physician Assistant

## 2021-02-25 NOTE — Telephone Encounter (Signed)
  Prescription Request  02/25/2021  What is the name of the medication or equipment? umeclidinium-vilanterol (ANORO ELLIPTA) 62.5-25 MCG/INH AEPB  Have you contacted your pharmacy to request a refill? (if applicable) yes  Which pharmacy would you like this sent to? [facility] [place]    Patient notified that their request is being sent to the clinical staff for review and that they should receive a response within 2 business days.

## 2021-02-26 ENCOUNTER — Telehealth: Payer: Self-pay

## 2021-02-26 ENCOUNTER — Other Ambulatory Visit: Payer: Self-pay

## 2021-02-26 ENCOUNTER — Telehealth: Payer: Self-pay | Admitting: Cardiology

## 2021-02-26 DIAGNOSIS — M7592 Shoulder lesion, unspecified, left shoulder: Secondary | ICD-10-CM

## 2021-02-26 DIAGNOSIS — J441 Chronic obstructive pulmonary disease with (acute) exacerbation: Secondary | ICD-10-CM

## 2021-02-26 MED ORDER — PROAIR HFA 108 (90 BASE) MCG/ACT IN AERS: 1.0000 | INHALATION_SPRAY | RESPIRATORY_TRACT | 3 refills | Status: DC | PRN

## 2021-02-26 MED ORDER — UMECLIDINIUM-VILANTEROL 62.5-25 MCG/INH IN AEPB: 1.0000 | INHALATION_SPRAY | Freq: Every day | RESPIRATORY_TRACT | 3 refills | Status: DC

## 2021-02-26 NOTE — Telephone Encounter (Signed)
Referral placed to Dr. Hilda Lias.

## 2021-02-26 NOTE — Telephone Encounter (Signed)
Pt's BP is going up and down- she was seen by the PCP last week and her BP was 205/60 so they started her back on the lisinopril.   Now it's fluctuating and she's supposed to have a DCCV next week and they will not let her have it done with it being so high.  Please call pt's daughter [name] [phone]

## 2021-02-26 NOTE — Telephone Encounter (Signed)
Refills of both inhalers sent to [facility] [place]

## 2021-02-26 NOTE — Telephone Encounter (Signed)
Pts daughter says that for Orthopedic referral, they would prefer to be sent to Dr Hilda Lias if possible.

## 2021-02-26 NOTE — Telephone Encounter (Signed)
Pts daughter [name]) called to check status on if Courtney Berry received request for inhaler and if she will send to [facility] [place] ASAP. Says pt told Courtney Berry at last visit that she still had some but says when they got home they checked the dates and they are all expired with nothing in them.

## 2021-02-26 NOTE — Telephone Encounter (Signed)
Per daughter, patient started taking lisinopril 20 mg daily since Saturday and was on 10 mg daily previously from PCP visit on 02/20/21. Reports checking BP daily. See readings below: 02/23/21 146/62 02/24/21 140/52 02/25/21 140/51 02/26/21 159/60  Advised to continue monitoring BP's and with the above BP readings, she should be okay to proceed with DCCV.  Verbalized understanding.

## 2021-03-05 ENCOUNTER — Other Ambulatory Visit (HOSPITAL_COMMUNITY)
Admission: RE | Admit: 2021-03-05 | Discharge: 2021-03-05 | Disposition: A | Discharge status: Home / Self Care | Payer: Medicare Other | Source: Ambulatory Visit | Attending: Cardiology | Admitting: Cardiology

## 2021-03-05 DIAGNOSIS — Z20822 Contact with and (suspected) exposure to covid-19: Secondary | ICD-10-CM | POA: Diagnosis not present

## 2021-03-05 DIAGNOSIS — Z01812 Encounter for preprocedural laboratory examination: Secondary | ICD-10-CM | POA: Diagnosis not present

## 2021-03-05 LAB — SARS CORONAVIRUS 2 (TAT 6-24 HRS): SARS Coronavirus 2: NEGATIVE

## 2021-03-07 ENCOUNTER — Encounter (HOSPITAL_COMMUNITY): Payer: Self-pay | Admitting: Anesthesiology

## 2021-03-07 ENCOUNTER — Encounter (HOSPITAL_COMMUNITY): Payer: Self-pay | Admitting: Cardiology

## 2021-03-07 ENCOUNTER — Ambulatory Visit (HOSPITAL_COMMUNITY)
Admission: RE | Admit: 2021-03-07 | Discharge: 2021-03-07 | Disposition: A | Discharge status: Home / Self Care | Payer: Medicare Other | Attending: Cardiology | Admitting: Cardiology

## 2021-03-07 ENCOUNTER — Encounter (HOSPITAL_COMMUNITY)
Admission: RE | Disposition: A | Discharge status: Home / Self Care | Payer: Self-pay | Source: Home / Self Care | Attending: Cardiology

## 2021-03-07 DIAGNOSIS — Z882 Allergy status to sulfonamides status: Secondary | ICD-10-CM | POA: Insufficient documentation

## 2021-03-07 DIAGNOSIS — I10 Essential (primary) hypertension: Secondary | ICD-10-CM | POA: Insufficient documentation

## 2021-03-07 DIAGNOSIS — M25512 Pain in left shoulder: Secondary | ICD-10-CM | POA: Diagnosis not present

## 2021-03-07 DIAGNOSIS — I4819 Other persistent atrial fibrillation: Secondary | ICD-10-CM | POA: Diagnosis not present

## 2021-03-07 DIAGNOSIS — Z539 Procedure and treatment not carried out, unspecified reason: Secondary | ICD-10-CM | POA: Diagnosis not present

## 2021-03-07 DIAGNOSIS — J449 Chronic obstructive pulmonary disease, unspecified: Secondary | ICD-10-CM | POA: Diagnosis not present

## 2021-03-07 DIAGNOSIS — Z888 Allergy status to other drugs, medicaments and biological substances status: Secondary | ICD-10-CM | POA: Insufficient documentation

## 2021-03-07 DIAGNOSIS — Z7901 Long term (current) use of anticoagulants: Secondary | ICD-10-CM | POA: Insufficient documentation

## 2021-03-07 DIAGNOSIS — Z880 Allergy status to penicillin: Secondary | ICD-10-CM | POA: Diagnosis not present

## 2021-03-07 DIAGNOSIS — Z79899 Other long term (current) drug therapy: Secondary | ICD-10-CM | POA: Diagnosis not present

## 2021-03-07 DIAGNOSIS — Z881 Allergy status to other antibiotic agents status: Secondary | ICD-10-CM | POA: Insufficient documentation

## 2021-03-07 SURGERY — CANCELLED PROCEDURE

## 2021-03-07 MED ORDER — SODIUM CHLORIDE 0.9 % IV SOLN: INTRAVENOUS | Status: DC

## 2021-03-07 NOTE — Anesthesia Preprocedure Evaluation (Deleted)
Anesthesia Evaluation  Patient identified by MRN, date of birth, ID band Patient awake    Reviewed: Allergy & Precautions, H&P , NPO status , Patient's Chart, lab work & pertinent test results, reviewed documented beta blocker date and time   Airway Mallampati: II  TM Distance: >3 FB Neck ROM: full    Dental no notable dental hx.    Pulmonary COPD,    Pulmonary exam normal breath sounds clear to auscultation       Cardiovascular Exercise Tolerance: Good hypertension, Pt. on medications + angina + dysrhythmias Atrial Fibrillation  Rhythm:regular Rate:Normal  ECHO 9/19 - Left ventricle: The cavity size was normal. Wall thickness was  increased increased in a pattern of mild to moderate LVH.  Systolic function was normal. The estimated ejection fraction was  in the range of 60% to 65%. Wall motion was normal; there were no  regional wall motion abnormalities. Doppler parameters are  consistent with abnormal left ventricular relaxation (grade 1  diastolic dysfunction).  - Aortic valve: Moderately calcified annulus. Trileaflet.  - Mitral valve: Mildly calcified annulus. There was trivial  regurgitation.    Neuro/Psych    GI/Hepatic   Endo/Other    Renal/GU      Musculoskeletal  (+) Arthritis , Rheumatoid disorders,    Abdominal   Peds  Hematology   Anesthesia Other Findings   Reproductive/Obstetrics                             Anesthesia Physical Anesthesia Plan  ASA: IV  Anesthesia Plan: General   Post-op Pain Management:    Induction:   PONV Risk Score and Plan: 3 and Treatment may vary due to age or medical condition  Airway Management Planned: Nasal Cannula, Natural Airway, Simple Face Mask and Mask  Additional Equipment:   Intra-op Plan:   Post-operative Plan:   Informed Consent: I have reviewed the patients History and Physical, chart, labs and discussed  the procedure including the risks, benefits and alternatives for the proposed anesthesia with the patient or authorized representative who has indicated his/her understanding and acceptance.     Dental Advisory Given  Plan Discussed with: CRNA and Anesthesiologist  Anesthesia Plan Comments:         Anesthesia Quick Evaluation

## 2021-03-07 NOTE — H&P (Signed)
Primary Care Physician: Gwenlyn Fudge, FNP Primary Cardiologist: Dr Diona Browner  Primary Electrophysiologist: none Referring Physician: Rennis Harding   Courtney Berry is a 87 y.o. female with a history of HTN, lymphedema, COPD, HLD, RA, atrial fibrillation who presents for consultation in the [facility].  The patient was initially diagnosed with atrial fibrillation remotely and has been maintained on metoprolol.  Patient is on Eliquis for a CHADS2VASC score of 4. She saw Rennis Harding on 12/28/20 and was in afib with RVR. Her metoprolol was increased but she continued to have issues with elevated heart rates and she was started on amiodarone.   On follow up today, patient reports she is doing well today. She did have dizziness since her last visit and her BB and amiodarone were reduced. Patient noted higher heart rates and self resumed the BID dose of BB. She reports that she feels well overall and is able to walk around the hose without becoming SOB. She denies any bleeding issues on anticoagulation.   Today, she denies symptoms of palpitations, chest pain, orthopnea, PND, lower extremity edema, presyncope, syncope, snoring, daytime somnolence, bleeding, or neurologic sequela. The patient is tolerating medications without difficulties and is otherwise without complaint today.    Atrial Fibrillation Risk Factors:  she does not have symptoms or diagnosis of sleep apnea. she does not have a history of rheumatic fever.   she has a BMI of Body mass index is 31.29 kg/m.Marland Kitchen    Filed Weights   02/04/21 1439  Weight: 90.6 kg         Family History  Problem Relation Age of Onset  . Colon cancer Father   . Cancer Mother   . Heart disease Sister        CABG age 79  . Cancer Brother      Atrial Fibrillation Management history:  Previous antiarrhythmic drugs: amiodarone  Previous cardioversions: none Previous ablations:  none CHADS2VASC score: 4 Anticoagulation history: Eliquis       Past Medical History:  Diagnosis Date  . COPD (chronic obstructive pulmonary disease) (HCC)   . Diverticulosis   . Essential hypertension   . History of chest pain    Low risk Cardiolite 2010  . Lymphedema   . Mixed hyperlipidemia   . PAF (paroxysmal atrial fibrillation) (HCC)   . Rheumatoid arthritis [facility])         Past Surgical History:  Procedure Laterality Date  . TOTAL ABDOMINAL HYSTERECTOMY  1969          Current Outpatient Medications  Medication Sig Dispense Refill  . acetaminophen (TYLENOL) 500 MG tablet Take 1,000 mg by mouth every 6 (six) hours as needed for moderate pain.    Marland Kitchen amiodarone (PACERONE) 200 MG tablet Take 1 tablet (200 mg total) by mouth daily. 60 tablet 6  . Bismuth Tribromoph-Petrolatum (XEROFORM PETROLAT GAUZE 5"X9") MISC APPLY 1 APPLICATION TOPICALLY DAILY. (BOTH LEGS) 30 each 2  . ELIQUIS 5 MG TABS tablet TAKE 1 TABLET BY MOUTH TWICE A DAY 60 tablet 11  . fluticasone (FLONASE) 50 MCG/ACT nasal spray Place 2 sprays into both nostrils daily. 16 g 6  . hydrochlorothiazide (HYDRODIURIL) 25 MG tablet Take 12.5 mg by mouth.    . metoprolol succinate (TOPROL-XL) 50 MG 24 hr tablet Take 1 tablet (50 mg total) by mouth daily. 60 tablet 6  . PROAIR HFA 108 (90 Base) MCG/ACT inhaler Inhale 1-2 puffs into the lungs every 6 (six) hours as needed  for wheezing or shortness of breath. 18 g 0  . Probiotic Product (PROBIOTIC DAILY PO) Take 1 tablet by mouth daily.    Marland Kitchen triamcinolone ointment (KENALOG) 0.1 % SMARTSIG:1 Application Topical 2-3 Times Daily    . umeclidinium-vilanterol (ANORO ELLIPTA) 62.5-25 MCG/INH AEPB Inhale 1 puff into the lungs as needed.      No current facility-administered medications for this encounter.         Allergies  Allergen Reactions  . Darvocet [Propoxyphene N-Acetaminophen] Other (See Comments)    unknown  . Doxycycline Itching  .  Nitrofurantoin Monohyd Macro Other (See Comments)    unknown  . Omnicef [Cefdinir]     Sick, rash  . Plavix [Clopidogrel Bisulfate] Swelling    Face swells  . Sulfa Antibiotics Other (See Comments)    "face got puffy"  . Amitriptyline Other (See Comments)    Made her "talk out of her head" and sleep until lunch the next day.  . Gabapentin Other (See Comments)    Made her "talk out of her head".  . Penicillins Rash    Has patient had a PCN reaction causing immediate rash, facial/tongue/throat swelling, SOB or lightheadedness with hypotension: Yes Has patient had a PCN reaction causing severe rash involving mucus membranes or skin necrosis: No Has patient had a PCN reaction that required hospitalization: No Has patient had a PCN reaction occurring within the last 10 years: No If all of the above answers are "NO", then may proceed with Cephalosporin use.     Social History        Socioeconomic History  . Marital status: Married    Spouse name: Not on file  . Number of children: Not on file  . Years of education: Not on file  . Highest education level: Not on file  Occupational History  . Occupation: Retired  Tobacco Use  . Smoking status: Never Smoker  . Smokeless tobacco: Never Used  . Tobacco comment: Tobacco use-no  Vaping Use  . Vaping Use: Never used  Substance and Sexual Activity  . Alcohol use: No    Alcohol/week: 0.0 standard drinks  . Drug use: No  . Sexual activity: Not on file  Other Topics Concern  . Not on file  Social History Narrative  . Not on file   Social Determinants of Health   Financial Resource Strain: Not on file  Food Insecurity: Not on file  Transportation Needs: Not on file  Physical Activity: Not on file  Stress: Not on file  Social Connections: Not on file  Intimate Partner Violence: Not on file     ROS- All systems are reviewed and negative except as per the HPI above.  Physical Exam:    Vitals:    02/04/21 1439  BP: 122/78  Pulse: 94  Weight: 90.6 kg  Height:  (1.702 m)    GEN- The patient is a well appearing obese elderly female, alert and oriented x 3 today.   HEENT-head normocephalic, atraumatic, sclera clear, conjunctiva pink, hearing intact, trachea midline. Lungs- Clear to ausculation bilaterally, normal work of breathing Heart- irregular rate and rhythm, no murmurs, rubs or gallops  GI- soft, NT, ND, + BS Extremities- no clubbing, cyanosis, or edema MS- no significant deformity or atrophy Skin- no rash or lesion Psych- euthymic mood, full affect Neuro- strength and sensation are intact      Wt Readings from Last 3 Encounters:  02/04/21 90.6 kg  01/17/21 89.8 kg  01/11/21 89.5 kg  EKG today demonstrates  Afib Vent. rate 94 BPM PR interval * ms QRS duration 80 ms QT/QTc 380/475 ms  Echo 08/02/18 demonstrated  Left ventricle: The cavity size was normal. Wall thickness was  increased increased in a pattern of mild to moderate LVH.  Systolic function was normal. The estimated ejection fraction was  in the range of 60% to 65%. Wall motion was normal; there were no  regional wall motion abnormalities. Doppler parameters are  consistent with abnormal left ventricular relaxation (grade 1  diastolic dysfunction).  - Aortic valve: Moderately calcified annulus. Trileaflet.  - Mitral valve: Mildly calcified annulus. There was trivial  regurgitation.  - Right atrium: Central venous pressure (est): 3 mm Hg.  - Atrial septum: No defect or patent foramen ovale was identified.  - Tricuspid valve: There was trivial regurgitation.  - Pulmonary arteries: Systolic pressure could not be accurately  estimated.  - Pericardium, extracardiac: There was no pericardial effusion.   Epic records are reviewed at length today  CHA2DS2-VASc Score = 4  The patient's score is based upon: CHF History: No HTN History: Yes Diabetes History: No Stroke  History: No Vascular Disease History: No Age Score: 2 Gender Score: 1         Primary Care Physician: Gwenlyn Fudge, FNP Primary Cardiologist: Dr Diona Browner  Primary Electrophysiologist: none Referring Physician: Rennis Harding   RAILYNN Berry is a 87 y.o. female with a history of HTN, lymphedema, COPD, HLD, RA, atrial fibrillation who presents for consultation in the [facility].  The patient was initially diagnosed with atrial fibrillation remotely and has been maintained on metoprolol.  Patient is on Eliquis for a CHADS2VASC score of 4. She saw Rennis Harding on 12/28/20 and was in afib with RVR. Her metoprolol was increased but she continued to have issues with elevated heart rates and she was started on amiodarone.   On follow up today, patient reports she is doing well today. She did have dizziness since her last visit and her BB and amiodarone were reduced. Patient noted higher heart rates and self resumed the BID dose of BB. She reports that she feels well overall and is able to walk around the hose without becoming SOB. She denies any bleeding issues on anticoagulation.   Today, she denies symptoms of palpitations, chest pain, orthopnea, PND, lower extremity edema, presyncope, syncope, snoring, daytime somnolence, bleeding, or neurologic sequela. The patient is tolerating medications without difficulties and is otherwise without complaint today.    Atrial Fibrillation Risk Factors:  she does not have symptoms or diagnosis of sleep apnea. she does not have a history of rheumatic fever.   she has a BMI of Body mass index is 31.29 kg/m.Marland Kitchen    Filed Weights   02/04/21 1439  Weight: 90.6 kg         Family History  Problem Relation Age of Onset  . Colon cancer Father   . Cancer Mother   . Heart disease Sister        CABG age 76  . Cancer Brother      Atrial Fibrillation Management history:  Previous antiarrhythmic  drugs: amiodarone  Previous cardioversions: none Previous ablations: none CHADS2VASC score: 4 Anticoagulation history: Eliquis       Past Medical History:  Diagnosis Date  . COPD (chronic obstructive pulmonary disease) (HCC)   . Diverticulosis   . Essential hypertension   . History of chest pain    Low risk Cardiolite 2010  .  Lymphedema   . Mixed hyperlipidemia   . PAF (paroxysmal atrial fibrillation) (HCC)   . Rheumatoid arthritis [facility])         Past Surgical History:  Procedure Laterality Date  . TOTAL ABDOMINAL HYSTERECTOMY  1969          Current Outpatient Medications  Medication Sig Dispense Refill  . acetaminophen (TYLENOL) 500 MG tablet Take 1,000 mg by mouth every 6 (six) hours as needed for moderate pain.    Marland Kitchen amiodarone (PACERONE) 200 MG tablet Take 1 tablet (200 mg total) by mouth daily. 60 tablet 6  . Bismuth Tribromoph-Petrolatum (XEROFORM PETROLAT GAUZE 5"X9") MISC APPLY 1 APPLICATION TOPICALLY DAILY. (BOTH LEGS) 30 each 2  . ELIQUIS 5 MG TABS tablet TAKE 1 TABLET BY MOUTH TWICE A DAY 60 tablet 11  . fluticasone (FLONASE) 50 MCG/ACT nasal spray Place 2 sprays into both nostrils daily. 16 g 6  . hydrochlorothiazide (HYDRODIURIL) 25 MG tablet Take 12.5 mg by mouth.    . metoprolol succinate (TOPROL-XL) 50 MG 24 hr tablet Take 1 tablet (50 mg total) by mouth daily. 60 tablet 6  . PROAIR HFA 108 (90 Base) MCG/ACT inhaler Inhale 1-2 puffs into the lungs every 6 (six) hours as needed for wheezing or shortness of breath. 18 g 0  . Probiotic Product (PROBIOTIC DAILY PO) Take 1 tablet by mouth daily.    Marland Kitchen triamcinolone ointment (KENALOG) 0.1 % SMARTSIG:1 Application Topical 2-3 Times Daily    . umeclidinium-vilanterol (ANORO ELLIPTA) 62.5-25 MCG/INH AEPB Inhale 1 puff into the lungs as needed.      No current facility-administered medications for this encounter.         Allergies  Allergen Reactions  . Darvocet [Propoxyphene  N-Acetaminophen] Other (See Comments)    unknown  . Doxycycline Itching  . Nitrofurantoin Monohyd Macro Other (See Comments)    unknown  . Omnicef [Cefdinir]     Sick, rash  . Plavix [Clopidogrel Bisulfate] Swelling    Face swells  . Sulfa Antibiotics Other (See Comments)    "face got puffy"  . Amitriptyline Other (See Comments)    Made her "talk out of her head" and sleep until lunch the next day.  . Gabapentin Other (See Comments)    Made her "talk out of her head".  . Penicillins Rash    Has patient had a PCN reaction causing immediate rash, facial/tongue/throat swelling, SOB or lightheadedness with hypotension: Yes Has patient had a PCN reaction causing severe rash involving mucus membranes or skin necrosis: No Has patient had a PCN reaction that required hospitalization: No Has patient had a PCN reaction occurring within the last 10 years: No If all of the above answers are "NO", then may proceed with Cephalosporin use.     Social History        Socioeconomic History  . Marital status: Married    Spouse name: Not on file  . Number of children: Not on file  . Years of education: Not on file  . Highest education level: Not on file  Occupational History  . Occupation: Retired  Tobacco Use  . Smoking status: Never Smoker  . Smokeless tobacco: Never Used  . Tobacco comment: Tobacco use-no  Vaping Use  . Vaping Use: Never used  Substance and Sexual Activity  . Alcohol use: No    Alcohol/week: 0.0 standard drinks  . Drug use: No  . Sexual activity: Not on file  Other Topics Concern  . Not on file  Social  History Narrative  . Not on file   Social Determinants of Health   Financial Resource Strain: Not on file  Food Insecurity: Not on file  Transportation Needs: Not on file  Physical Activity: Not on file  Stress: Not on file  Social Connections: Not on file  Intimate Partner Violence: Not on file     ROS- All systems are  reviewed and negative except as per the HPI above.  Physical Exam:    Vitals:   02/04/21 1439  BP: 122/78  Pulse: 94  Weight: 90.6 kg  Height: 5\' 7"  (1.702 m)    GEN- The patient is a well appearing obese elderly female, alert and oriented x 3 today.   HEENT-head normocephalic, atraumatic, sclera clear, conjunctiva pink, hearing intact, trachea midline. Lungs- Clear to ausculation bilaterally, normal work of breathing Heart- irregular rate and rhythm, no murmurs, rubs or gallops  GI- soft, NT, ND, + BS Extremities- no clubbing, cyanosis, or edema MS- no significant deformity or atrophy Skin- no rash or lesion Psych- euthymic mood, full affect Neuro- strength and sensation are intact      Wt Readings from Last 3 Encounters:  02/04/21 90.6 kg  01/17/21 89.8 kg  01/11/21 89.5 kg    EKG today demonstrates  Afib Vent. rate 94 BPM PR interval * ms QRS duration 80 ms QT/QTc 380/475 ms  Echo 08/02/18 demonstrated  Left ventricle: The cavity size was normal. Wall thickness was  increased increased in a pattern of mild to moderate LVH.  Systolic function was normal. The estimated ejection fraction was  in the range of 60% to 65%. Wall motion was normal; there were no  regional wall motion abnormalities. Doppler parameters are  consistent with abnormal left ventricular relaxation (grade 1  diastolic dysfunction).  - Aortic valve: Moderately calcified annulus. Trileaflet.  - Mitral valve: Mildly calcified annulus. There was trivial  regurgitation.  - Right atrium: Central venous pressure (est): 3 mm Hg.  - Atrial septum: No defect or patent foramen ovale was identified.  - Tricuspid valve: There was trivial regurgitation.  - Pulmonary arteries: Systolic pressure could not be accurately  estimated.  - Pericardium, extracardiac: There was no pericardial effusion.   Epic records are reviewed at length today  CHA2DS2-VASc Score = 4  The patient's  score is based upon: CHF History: No HTN History: Yes Diabetes History: No Stroke History: No Vascular Disease History: No Age Score: 2 Gender Score: 1      ASSESSMENT AND PLAN: 1. Persistent Atrial Fibrillation (ICD10:  I48.19) The patient's CHA2DS2-VASc score is 4, indicating a 4.8% annual risk of stroke.   Patient remains in rate controlled afib. Will plan for DCCV. Check Cmet/TSH/CBC Continue Toprol 50 mg BID Continue amiodarone 200 mg daily Continue Eliquis 5 mg BID  2. Secondary Hypercoagulable State (ICD10:  D68.69) The patient is at significant risk for stroke/thromboembolism based upon her CHA2DS2-VASc Score of 4.  Continue Apixaban (Eliquis).   3. Obesity Body mass index is 31.29 kg/m. Lifestyle modification was discussed and encouraged including regular physical activity and weight reduction.  4. HTN Stable, no changes today.   Follow up in the AF clinic one week post DCCV.    Jorja Loa PA-C Afib Clinic [facility] [address] [place], [place] [address] [phone] 02/04/2021 3:39 PM        Electronically signed by Danice Goltz, PA at 02/04/2021 3:39 PM  ASSESSMENT AND PLAN: 1. Persistent Atrial Fibrillation (ICD10:  I48.19) The patient's  CHA2DS2-VASc score is 4, indicating a 4.8% annual risk of stroke.   Patient remains in rate controlled afib. Will plan for DCCV. Check Cmet/TSH/CBC Continue Toprol 50 mg BID Continue amiodarone 200 mg daily Continue Eliquis 5 mg BID  2. Secondary Hypercoagulable State (ICD10:  D68.69) The patient is at significant risk for stroke/thromboembolism based upon her CHA2DS2-VASc Score of 4.  Continue Apixaban (Eliquis).   3. Obesity Body mass index is 31.29 kg/m. Lifestyle modification was discussed and encouraged including regular physical activity and weight reduction.  4. HTN Stable, no changes today.   Follow up in the AF clinic one week post DCCV.    Jorja Loa  PA-C Afib Clinic [facility] [address] [place], [place] [address] [phone] 02/04/2021 3:39 PM        Electronically signed by Danice Goltz, PA at 02/04/2021 3:39 PM   Addendum:  Patient presents today for Cardioversion.  She has not missed any doses of her Eliquis  BID for the past 4 weeks.    PE:  GEN: Well nourished, well developed in no acute distress HEENT: Normal NECK: No JVD; No carotid bruits LYMPHATICS: No lymphadenopathy CARDIAC:irregularly irregular, no murmurs, rubs, gallops RESPIRATORY:  Clear to auscultation without rales, wheezing or rhonchi  ABDOMEN: Soft, non-tender, non-distended MUSCULOSKELETAL:  No edema; No deformity  SKIN: Warm and dry NEUROLOGIC:  Alert and oriented x 3 PSYCHIATRIC:  Normal affect   EKG today shows atrial fibrillation with CVR.    Plan for DCCV today for persistent atrial fibrillation.   Signed:  Armanda Magic, MD [facility] 03/07/2021

## 2021-03-07 NOTE — Progress Notes (Signed)
Pt to endo for cardioversion, pt self converted to SB, procedure cancelled per Dr. Mayford Knife

## 2021-03-07 NOTE — Interval H&P Note (Signed)
History and Physical Interval Note:  03/07/2021 10:15 AM  Courtney Berry  has presented today for surgery, with the diagnosis of AFIB.  The various methods of treatment have been discussed with the patient and family. After consideration of risks, benefits and other options for treatment, the patient has consented to  Procedure(s): CARDIOVERSION (N/A) as a surgical intervention.  The patient's history has been reviewed, patient examined, no change in status, stable for surgery.  I have reviewed the patient's chart and labs.  Questions were answered to the patient's satisfaction.     Armanda Magic

## 2021-03-07 NOTE — CV Procedure (Signed)
    Patient presented today for DCCV for atrial fibrillation and was found to be in NSR.  DCCV cancelled.   Courtney Berry 03/07/2021, 10:15 AM

## 2021-03-08 ENCOUNTER — Ambulatory Visit (INDEPENDENT_AMBULATORY_CARE_PROVIDER_SITE_OTHER): Payer: Medicare Other

## 2021-03-08 VITALS — Ht 67.0 in | Wt 198.0 lb

## 2021-03-08 DIAGNOSIS — Z0000 Encounter for general adult medical examination without abnormal findings: Secondary | ICD-10-CM

## 2021-03-08 NOTE — Patient Instructions (Signed)
Courtney Berry , Thank you for taking time to come for your Medicare Wellness Visit. I appreciate your ongoing commitment to your health goals. Please review the following plan we discussed and let me know if I can assist you in the future.   Screening recommendations/referrals: Colonoscopy: No longer required Mammogram: No longer required Bone Density: Declined Recommended yearly ophthalmology/optometry visit for glaucoma screening and checkup Recommended yearly dental visit for hygiene and checkup  Vaccinations: Influenza vaccine: Declined Pneumococcal vaccine: Declined Tdap vaccine: Declined Shingles vaccine: Declined   Covid-19:Declined  Advanced directives: Please bring a copy of your health care power of attorney and living will to the office to be added to your chart at your convenience.  Conditions/risks identified: Continue fall prevention - Try the balance and strength exercises. Aim for 30 minutes of exercise or brisk walking each day, drink 6-8 glasses of water and eat lots of fruits and vegetables.  Next appointment: Follow up in one year for your annual wellness visit    Preventive Care 65 Years and Older, Female Preventive care refers to lifestyle choices and visits with your health care provider that can promote health and wellness. What does preventive care include?  A yearly physical exam. This is also called an annual well check.  Dental exams once or twice a year.  Routine eye exams. Ask your health care provider how often you should have your eyes checked.  Personal lifestyle choices, including:  Daily care of your teeth and gums.  Regular physical activity.  Eating a healthy diet.  Avoiding tobacco and drug use.  Limiting alcohol use.  Practicing safe sex.  Taking low-dose aspirin every day.  Taking vitamin and mineral supplements as recommended by your health care provider. What happens during an annual well check? The services and screenings  done by your health care provider during your annual well check will depend on your age, overall health, lifestyle risk factors, and family history of disease. Counseling  Your health care provider may ask you questions about your:  Alcohol use.  Tobacco use.  Drug use.  Emotional well-being.  Home and relationship well-being.  Sexual activity.  Eating habits.  History of falls.  Memory and ability to understand (cognition).  Work and work Astronomer.  Reproductive health. Screening  You may have the following tests or measurements:  Height, weight, and BMI.  Blood pressure.  Lipid and cholesterol levels. These may be checked every 5 years, or more frequently if you are over 33 years old.  Skin check.  Lung cancer screening. You may have this screening every year starting at age 85 if you have a 30-pack-year history of smoking and currently smoke or have quit within the past 15 years.  Fecal occult blood test (FOBT) of the stool. You may have this test every year starting at age 27.  Flexible sigmoidoscopy or colonoscopy. You may have a sigmoidoscopy every 5 years or a colonoscopy every 10 years starting at age 79.  Hepatitis C blood test.  Hepatitis B blood test.  Sexually transmitted disease (STD) testing.  Diabetes screening. This is done by checking your blood sugar (glucose) after you have not eaten for a while (fasting). You may have this done every 1-3 years.  Bone density scan. This is done to screen for osteoporosis. You may have this done starting at age 33.  Mammogram. This may be done every 1-2 years. Talk to your health care provider about how often you should have regular mammograms. Talk with your  health care provider about your test results, treatment options, and if necessary, the need for more tests. Vaccines  Your health care provider may recommend certain vaccines, such as:  Influenza vaccine. This is recommended every year.  Tetanus,  diphtheria, and acellular pertussis (Tdap, Td) vaccine. You may need a Td booster every 10 years.  Zoster vaccine. You may need this after age 58.  Pneumococcal 13-valent conjugate (PCV13) vaccine. One dose is recommended after age 55.  Pneumococcal polysaccharide (PPSV23) vaccine. One dose is recommended after age 17. Talk to your health care provider about which screenings and vaccines you need and how often you need them. This information is not intended to replace advice given to you by your health care provider. Make sure you discuss any questions you have with your health care provider. Document Released: 11/30/2015 Document Revised: 07/23/2016 Document Reviewed: 09/04/2015 Elsevier Interactive Patient Education  2017 [employer] Prevention in the Home Falls can cause injuries. They can happen to people of all ages. There are many things you can do to make your home safe and to help prevent falls. What can I do on the outside of my home?  Regularly fix the edges of walkways and driveways and fix any cracks.  Remove anything that might make you trip as you walk through a door, such as a raised step or threshold.  Trim any bushes or trees on the path to your home.  Use bright outdoor lighting.  Clear any walking paths of anything that might make someone trip, such as rocks or tools.  Regularly check to see if handrails are loose or broken. Make sure that both sides of any steps have handrails.  Any raised decks and porches should have guardrails on the edges.  Have any leaves, snow, or ice cleared regularly.  Use sand or salt on walking paths during winter.  Clean up any spills in your garage right away. This includes oil or grease spills. What can I do in the bathroom?  Use night lights.  Install grab bars by the toilet and in the tub and shower. Do not use towel bars as grab bars.  Use non-skid mats or decals in the tub or shower.  If you need to sit down in  the shower, use a plastic, non-slip stool.  Keep the floor dry. Clean up any water that spills on the floor as soon as it happens.  Remove soap buildup in the tub or shower regularly.  Attach bath mats securely with double-sided non-slip rug tape.  Do not have throw rugs and other things on the floor that can make you trip. What can I do in the bedroom?  Use night lights.  Make sure that you have a light by your bed that is easy to reach.  Do not use any sheets or blankets that are too big for your bed. They should not hang down onto the floor.  Have a firm chair that has side arms. You can use this for support while you get dressed.  Do not have throw rugs and other things on the floor that can make you trip. What can I do in the kitchen?  Clean up any spills right away.  Avoid walking on wet floors.  Keep items that you use a lot in easy-to-reach places.  If you need to reach something above you, use a strong step stool that has a grab bar.  Keep electrical cords out of the way.  Do not use  floor polish or wax that makes floors slippery. If you must use wax, use non-skid floor wax.  Do not have throw rugs and other things on the floor that can make you trip. What can I do with my stairs?  Do not leave any items on the stairs.  Make sure that there are handrails on both sides of the stairs and use them. Fix handrails that are broken or loose. Make sure that handrails are as long as the stairways.  Check any carpeting to make sure that it is firmly attached to the stairs. Fix any carpet that is loose or worn.  Avoid having throw rugs at the top or bottom of the stairs. If you do have throw rugs, attach them to the floor with carpet tape.  Make sure that you have a light switch at the top of the stairs and the bottom of the stairs. If you do not have them, ask someone to add them for you. What else can I do to help prevent falls?  Wear shoes that:  Do not have high  heels.  Have rubber bottoms.  Are comfortable and fit you well.  Are closed at the toe. Do not wear sandals.  If you use a stepladder:  Make sure that it is fully opened. Do not climb a closed stepladder.  Make sure that both sides of the stepladder are locked into place.  Ask someone to hold it for you, if possible.  Clearly mark and make sure that you can see:  Any grab bars or handrails.  First and last steps.  Where the edge of each step is.  Use tools that help you move around (mobility aids) if they are needed. These include:  Canes.  Walkers.  Scooters.  Crutches.  Turn on the lights when you go into a dark area. Replace any light bulbs as soon as they burn out.  Set up your furniture so you have a clear path. Avoid moving your furniture around.  If any of your floors are uneven, fix them.  If there are any pets around you, be aware of where they are.  Review your medicines with your doctor. Some medicines can make you feel dizzy. This can increase your chance of falling. Ask your doctor what other things that you can do to help prevent falls. This information is not intended to replace advice given to you by your health care provider. Make sure you discuss any questions you have with your health care provider. Document Released: 08/30/2009 Document Revised: 04/10/2016 Document Reviewed: 12/08/2014 Elsevier Interactive Patient Education  2017 [employer].

## 2021-03-08 NOTE — Progress Notes (Signed)
Subjective:   Courtney Berry is a 87 y.o. female who presents for Medicare Annual (Subsequent) preventive examination.  Virtual Visit via Telephone Note  I connected with  Courtney Berry on 03/08/21 at  2:00 PM EDT by telephone and verified that I am speaking with the correct person using two identifiers.  Location: Patient: Home Provider: [facility] Persons participating in the virtual visit: patient/Nurse Health Advisor   I discussed the limitations, risks, security and privacy concerns of performing an evaluation and management service by telephone and the availability of in person appointments. The patient expressed understanding and agreed to proceed.  Interactive audio and video telecommunications were attempted between this nurse and patient, however failed, due to patient having technical difficulties OR patient did not have access to video capability.  We continued and completed visit with audio only.  Some vital signs may be absent or patient reported.   Jaidin Richison E Kadija Cruzen, LPN   Review of Systems     Cardiac Risk Factors include: advanced age (>19men, >61 women);dyslipidemia;hypertension;obesity (BMI >30kg/m2);sedentary lifestyle     Objective:    Today's Vitals   03/08/21 1345 03/08/21 1346  Weight: 198 lb (89.8 kg)   Height:  (1.702 m)   PainSc:  0-No pain   Body mass index is 31.01 kg/m.  Advanced Directives 03/07/2021 03/17/2020 10/26/2019 10/18/2019 08/01/2018 08/01/2018 03/10/2018  Does Patient Have a Medical Advance Directive? Yes No Yes No No Yes Yes  Type of Forensic scientist - - Healthcare Power of [employer] Power of [place];Living will  Does patient want to make changes to medical advance directive? - - - - No - Patient declined No - Patient declined -  Copy of Healthcare Power of Attorney in Chart? Yes - validated most recent copy scanned in chart (See row information) - No - copy  requested - - No - copy requested No - copy requested  Would patient like information on creating a medical advance directive? - No - Patient declined - - No - Patient declined - -    Current Medications (verified) Outpatient Encounter Medications as of 03/08/2021  Medication Sig  . acetaminophen (TYLENOL) 500 MG tablet Take 1,000 mg by mouth every 6 (six) hours as needed for moderate pain.  Marland Kitchen amiodarone (PACERONE) 200 MG tablet Take 1 tablet (200 mg total) by mouth daily.  . Bismuth Tribromoph-Petrolatum (XEROFORM PETROLAT GAUZE 5"X9") MISC APPLY 1 APPLICATION TOPICALLY DAILY. (BOTH LEGS)  . DULoxetine (CYMBALTA) 30 MG capsule Take 30 mg by mouth daily.  Marland Kitchen ELIQUIS 5 MG TABS tablet TAKE 1 TABLET BY MOUTH TWICE A DAY (Patient taking differently: Take 5 mg by mouth 2 (two) times daily.)  . fluticasone (FLONASE) 50 MCG/ACT nasal spray Place 2 sprays into both nostrils daily. (Patient taking differently: Place 2 sprays into both nostrils daily as needed for allergies.)  . lisinopril (ZESTRIL) 20 MG tablet Take 20 mg by mouth daily.  . metoprolol succinate (TOPROL-XL) 50 MG 24 hr tablet Take 1 tablet (50 mg total) by mouth daily. (Patient taking differently: Take 50 mg by mouth in the morning and at bedtime.)  . PROAIR HFA 108 (90 Base) MCG/ACT inhaler Inhale 1-2 puffs into the lungs as needed for wheezing or shortness of breath.  . Probiotic Product (PROBIOTIC DAILY PO) Take 1 tablet by mouth daily.  Marland Kitchen umeclidinium-vilanterol (ANORO ELLIPTA) 62.5-25 MCG/INH AEPB Inhale 1 puff into the lungs daily.   No facility-administered encounter medications  on file as of 03/08/2021.    Allergies (verified) Darvocet [propoxyphene n-acetaminophen], Doxycycline, Nitrofurantoin monohyd macro, Plavix [clopidogrel bisulfate], Sulfa antibiotics, Amitriptyline, Gabapentin, Omnicef [cefdinir], and Penicillins   History: Past Medical History:  Diagnosis Date  . COPD (chronic obstructive pulmonary disease) (HCC)   .  Diverticulosis   . Essential hypertension   . History of chest pain    Low risk Cardiolite 2010  . Lymphedema   . Mixed hyperlipidemia   . PAF (paroxysmal atrial fibrillation) (HCC)   . Rheumatoid arthritis [facility])    Past Surgical History:  Procedure Laterality Date  . TOTAL ABDOMINAL HYSTERECTOMY  1969   Family History  Problem Relation Age of Onset  . Colon cancer Father   . Cancer Mother   . Heart disease Sister        CABG age 82  . Cancer Brother    Social History   Socioeconomic History  . Marital status: Married    Spouse name: Not on file  . Number of children: 2  . Years of education: Not on file  . Highest education level: Not on file  Occupational History  . Occupation: Retired  Tobacco Use  . Smoking status: Never Smoker  . Smokeless tobacco: Never Used  . Tobacco comment: Tobacco use-no  Vaping Use  . Vaping Use: Never used  Substance and Sexual Activity  . Alcohol use: No    Alcohol/week: 0.0 standard drinks  . Drug use: No  . Sexual activity: Not on file  Other Topics Concern  . Not on file  Social History Narrative   Lives with her husband - one level living, handicap accessible home - Son and daughter both live very close by.   Social Determinants of Health   Financial Resource Strain: Low Risk   . Difficulty of Paying Living Expenses: Not hard at all  Food Insecurity: No Food Insecurity  . Worried About Programme researcher, broadcasting/film/video in the Last Year: Never true  . Ran Out of Food in the Last Year: Never true  Transportation Needs: No Transportation Needs  . Lack of Transportation (Medical): No  . Lack of Transportation (Non-Medical): No  Physical Activity: Insufficiently Active  . Days of Exercise per Week: 7 days  . Minutes of Exercise per Session: 10 min  Stress: No Stress Concern Present  . Feeling of Stress : Not at all  Social Connections: Unknown  . Frequency of Communication with Friends and Family: Twice a week  . Frequency of Social  Gatherings with Friends and Family: Twice a week  . Attends Religious Services: Not on file  . Active Member of Clubs or Organizations: Yes  . Attends Banker Meetings: More than 4 times per year  . Marital Status: Married    Tobacco Counseling Counseling given: Not Answered Comment: Tobacco use-no   Clinical Intake:  Pre-visit preparation completed: Yes  Pain : No/denies pain Pain Score: 0-No pain     BMI - recorded: 31.01 Nutritional Status: BMI > 30  Obese Nutritional Risks: None Diabetes: No  How often do you need to have someone help you when you read instructions, pamphlets, or other written materials from your doctor or pharmacy?: 1 - Never  Diabetic? no  Interpreter Needed?: No  Information entered by :: Elliana Bal, LPN   Activities of Daily Living In your present state of health, do you have any difficulty performing the following activities: 03/08/2021  Hearing? N  Vision? N  Difficulty concentrating or making decisions?  N  Walking or climbing stairs? N  Dressing or bathing? N  Doing errands, shopping? N  Preparing Food and eating ? N  Using the Toilet? N  In the past six months, have you accidently leaked urine? N  Do you have problems with loss of bowel control? N  Managing your Medications? N  Managing your Finances? N  Housekeeping or managing your Housekeeping? N  Some recent data might be hidden    Patient Care Team: Gwenlyn Fudge, FNP as PCP - General (Family Medicine) Jonelle Sidle, MD as PCP - Cardiology (Cardiology) Kari Baars, MD as Consulting Physician (Pulmonary Disease) Jena Gauss Gerrit Friends, MD as Consulting Physician (Gastroenterology)  Indicate any recent Medical Services you may have received from other than [facility] providers in the past year (date may be approximate).     Assessment:   This is a routine wellness examination for [place].  Hearing/Vision screen  Hearing Screening   125Hz  250Hz  500Hz  1000Hz   2000Hz  3000Hz  4000Hz  6000Hz  8000Hz   Right ear:           Left ear:           Comments: Denies hearing difficulties  Vision Screening Comments: Sees Dr in [place] - behind on eye exams - wears eyeglasses for reading only  Dietary issues and exercise activities discussed: Current Exercise Habits: Home exercise routine, Type of exercise: walking, Time (Minutes): 10, Frequency (Times/Week): 7, Weekly Exercise (Minutes/Week): 70, Intensity: Mild, Exercise limited by: respiratory conditions(s)  Goals    . Weight (lb) < 200 lb (90.7 kg)     Reduce breads and sweets.      Depression Screen PHQ 2/9 Scores 03/08/2021 02/19/2021 08/21/2020 03/28/2020 02/20/2020 12/09/2019 09/22/2019  PHQ - 2 Score 0 0 0 0 0 0 0  PHQ- 9 Score - - - - - - -    Fall Risk Fall Risk  03/08/2021 02/19/2021 08/21/2020 05/29/2020 03/28/2020  Falls in the past year? 1 1 0 1 0  Number falls in past yr: 0 0 - 0 -  Injury with Fall? 0 1 - 0 -  Risk for fall due to : History of fall(s);Impaired balance/gait;Orthopedic patient - - - -  Follow up Education provided;Falls prevention discussed - - Falls prevention discussed -    FALL RISK PREVENTION PERTAINING TO THE HOME:  Any stairs in or around the home? No  If so, are there any without handrails? No  Home free of loose throw rugs in walkways, pet beds, electrical cords, etc? Yes  Adequate lighting in your home to reduce risk of falls? Yes   ASSISTIVE DEVICES UTILIZED TO PREVENT FALLS:  Life alert? No  Use of a cane, walker or w/c? Yes  Grab bars in the bathroom? Yes  Shower chair or bench in shower? Yes  Elevated toilet seat or a handicapped toilet? Yes   TIMED UP AND GO:  Was the test performed? No . Telephonic visit.  Cognitive Function: Normal cognitive status assessed by direct observation by this Nurse Health Advisor. No abnormalities found.   MMSE - Mini Mental State Exam 03/10/2018  Orientation to time 5  Orientation to Place 4  Registration 3  Attention/  Calculation 3  Recall 2  Language- name 2 objects 2  Language- repeat 0  Language- follow 3 step command 3  Language- read & follow direction 1  Write a sentence 1  Copy design 1  Total score 25        Immunizations  There  is no immunization history on file for this patient.  TDAP status: Due, Education has been provided regarding the importance of this vaccine. Advised may receive this vaccine at local pharmacy or Health Dept. Aware to provide a copy of the vaccination record if obtained from local pharmacy or Health Dept. Verbalized acceptance and understanding.  Flu Vaccine status: Declined, Education has been provided regarding the importance of this vaccine but patient still declined. Advised may receive this vaccine at local pharmacy or Health Dept. Aware to provide a copy of the vaccination record if obtained from local pharmacy or Health Dept. Verbalized acceptance and understanding.  Pneumococcal vaccine status: Declined,  Education has been provided regarding the importance of this vaccine but patient still declined. Advised may receive this vaccine at local pharmacy or Health Dept. Aware to provide a copy of the vaccination record if obtained from local pharmacy or Health Dept. Verbalized acceptance and understanding.   Covid-19 vaccine status: Declined, Education has been provided regarding the importance of this vaccine but patient still declined. Advised may receive this vaccine at local pharmacy or Health Dept.or vaccine clinic. Aware to provide a copy of the vaccination record if obtained from local pharmacy or Health Dept. Verbalized acceptance and understanding.  Qualifies for Shingles Vaccine? No   Zostavax completed No   Shingrix Completed?: No.    Education has been provided regarding the importance of this vaccine. Patient has been advised to call insurance company to determine out of pocket expense if they have not yet received this vaccine. Advised may also receive  vaccine at local pharmacy or Health Dept. Verbalized acceptance and understanding.  Screening Tests Health Maintenance  Topic Date Due  . COVID-19 Vaccine (1) Never done  . DEXA SCAN  05/29/2021 (Originally 01/17/2000)  . TETANUS/TDAP  05/29/2021 (Originally 01/16/1952)  . PNA vac Low Risk Adult (1 of 2 - PCV13) 05/29/2021 (Originally 01/17/2000)  . INFLUENZA VACCINE  06/17/2021  . HPV VACCINES  Aged Out    Health Maintenance  Health Maintenance Due  Topic Date Due  . COVID-19 Vaccine (1) Never done    Colorectal cancer screening: No longer required.   Mammogram status: No longer required due to age.  Bone Density screening: patient declines  Lung Cancer Screening: (Low Dose CT Chest recommended if Age 75-80 years, 30 pack-year currently smoking OR have quit w/in 15years.) does not qualify.   Additional Screening:  Hepatitis C Screening: does not qualify  Vision Screening: Recommended annual ophthalmology exams for early detection of glaucoma and other disorders of the eye. Is the patient up to date with their annual eye exam?  No  Who is the provider or what is the name of the office in which the patient attends annual eye exams? Dr Earlene Plater in [place] If pt is not established with a provider, would they like to be referred to a provider to establish care? No .   Dental Screening: Recommended annual dental exams for proper oral hygiene  Community Resource Referral / Chronic Care Management: CRR required this visit?  No   CCM required this visit?  No      Plan:     I have personally reviewed and noted the following in the patient's chart:   . Medical and social history . Use of alcohol, tobacco or illicit drugs  . Current medications and supplements . Functional ability and status . Nutritional status . Physical activity . Advanced directives . List of other physicians . Hospitalizations, surgeries, and ER visits in  previous 12 months . Vitals . Screenings to include  cognitive, depression, and falls . Referrals and appointments  In addition, I have reviewed and discussed with patient certain preventive protocols, quality metrics, and best practice recommendations. A written personalized care plan for preventive services as well as general preventive health recommendations were provided to patient.     [place] Constable, LPN   1/61/0960   Nurse Notes: Declines all vaccines and screenings

## 2021-03-11 ENCOUNTER — Encounter (HOSPITAL_COMMUNITY): Payer: Self-pay | Admitting: Physician Assistant

## 2021-03-11 ENCOUNTER — Ambulatory Visit (HOSPITAL_COMMUNITY)
Admission: RE | Admit: 2021-03-11 | Discharge: 2021-03-11 | Disposition: A | Discharge status: Home / Self Care | Payer: Medicare Other | Source: Ambulatory Visit | Attending: Nurse Practitioner | Admitting: Nurse Practitioner

## 2021-03-11 ENCOUNTER — Other Ambulatory Visit: Payer: Self-pay

## 2021-03-11 VITALS — BP 132/80 | HR 47 | Ht 67.0 in | Wt 192.6 lb

## 2021-03-11 DIAGNOSIS — E669 Obesity, unspecified: Secondary | ICD-10-CM | POA: Diagnosis not present

## 2021-03-11 DIAGNOSIS — Z8249 Family history of ischemic heart disease and other diseases of the circulatory system: Secondary | ICD-10-CM | POA: Diagnosis not present

## 2021-03-11 DIAGNOSIS — I10 Essential (primary) hypertension: Secondary | ICD-10-CM | POA: Insufficient documentation

## 2021-03-11 DIAGNOSIS — I4819 Other persistent atrial fibrillation: Secondary | ICD-10-CM

## 2021-03-11 DIAGNOSIS — Z7901 Long term (current) use of anticoagulants: Secondary | ICD-10-CM | POA: Insufficient documentation

## 2021-03-11 DIAGNOSIS — Z6830 Body mass index (BMI) 30.0-30.9, adult: Secondary | ICD-10-CM | POA: Insufficient documentation

## 2021-03-11 DIAGNOSIS — E782 Mixed hyperlipidemia: Secondary | ICD-10-CM | POA: Insufficient documentation

## 2021-03-11 DIAGNOSIS — Z79899 Other long term (current) drug therapy: Secondary | ICD-10-CM | POA: Insufficient documentation

## 2021-03-11 DIAGNOSIS — J449 Chronic obstructive pulmonary disease, unspecified: Secondary | ICD-10-CM | POA: Diagnosis not present

## 2021-03-11 DIAGNOSIS — D6869 Other thrombophilia: Secondary | ICD-10-CM | POA: Diagnosis not present

## 2021-03-11 NOTE — Progress Notes (Addendum)
Primary Care Physician: Gwenlyn Fudge, FNP Primary Cardiologist: Dr Diona Browner  Primary Electrophysiologist: none Referring Physician: Rennis Harding   Courtney Berry is a 87 y.o. female with a history of HTN, lymphedema, COPD, HLD, RA, atrial fibrillation who presents for follow up in the [facility].  The patient was initially diagnosed with atrial fibrillation remotely and has been maintained on metoprolol.  Patient is on Eliquis for a CHADS2VASC score of 4. She saw Rennis Harding on 12/28/20 and was in afib with RVR. Her metoprolol was increased but she continued to have issues with elevated heart rates and she was started on amiodarone.   On follow up today, patient presented for DCCV and was back in SR, procedure was cancelled. She reports that she feels well in SR. She denies dizziness or presyncope. She denies any bleeding issues on anticoagulation.   Today, she denies symptoms of palpitations, chest pain, orthopnea, PND, lower extremity edema, presyncope, syncope, snoring, daytime somnolence, bleeding, or neurologic sequela. The patient is tolerating medications without difficulties and is otherwise without complaint today.    Atrial Fibrillation Risk Factors:  she does not have symptoms or diagnosis of sleep apnea. she does not have a history of rheumatic fever.   she has a BMI of Body mass index is 30.17 kg/m.Marland Kitchen Filed Weights   03/11/21 1401  Weight: 87.4 kg    Family History  Problem Relation Age of Onset  . Colon cancer Father   . Cancer Mother   . Heart disease Sister        CABG age 78  . Cancer Brother      Atrial Fibrillation Management history:  Previous antiarrhythmic drugs: amiodarone  Previous cardioversions: none Previous ablations: none CHADS2VASC score: 4 Anticoagulation history: Eliquis   Past Medical History:  Diagnosis Date  . COPD (chronic obstructive pulmonary disease) (HCC)   . Diverticulosis   . Essential  hypertension   . History of chest pain    Low risk Cardiolite 2010  . Lymphedema   . Mixed hyperlipidemia   . PAF (paroxysmal atrial fibrillation) (HCC)   . Rheumatoid arthritis [facility])    Past Surgical History:  Procedure Laterality Date  . TOTAL ABDOMINAL HYSTERECTOMY  1969    Current Outpatient Medications  Medication Sig Dispense Refill  . acetaminophen (TYLENOL) 500 MG tablet Take 1,000 mg by mouth every 6 (six) hours as needed for moderate pain.    Marland Kitchen amiodarone (PACERONE) 200 MG tablet Take 1 tablet (200 mg total) by mouth daily. 60 tablet 6  . Bismuth Tribromoph-Petrolatum (XEROFORM PETROLAT GAUZE 5"X9") MISC APPLY 1 APPLICATION TOPICALLY DAILY. (BOTH LEGS) 30 each 2  . DULoxetine (CYMBALTA) 30 MG capsule Take 30 mg by mouth daily.    Marland Kitchen ELIQUIS 5 MG TABS tablet TAKE 1 TABLET BY MOUTH TWICE A DAY 60 tablet 11  . fluticasone (FLONASE) 50 MCG/ACT nasal spray Place 2 sprays into both nostrils daily. 16 g 6  . lisinopril (ZESTRIL) 20 MG tablet Take 20 mg by mouth daily.    . metoprolol succinate (TOPROL-XL) 50 MG 24 hr tablet Take 1 tablet (50 mg total) by mouth daily. 60 tablet 6  . PROAIR HFA 108 (90 Base) MCG/ACT inhaler Inhale 1-2 puffs into the lungs as needed for wheezing or shortness of breath. 18 g 3  . Probiotic Product (PROBIOTIC DAILY PO) Take 1 tablet by mouth daily.    Marland Kitchen umeclidinium-vilanterol (ANORO ELLIPTA) 62.5-25 MCG/INH AEPB Inhale 1 puff into the lungs  daily. 60 each 3   No current facility-administered medications for this encounter.    Allergies  Allergen Reactions  . Darvocet [Propoxyphene N-Acetaminophen] Other (See Comments)    unknown  . Doxycycline Itching  . Nitrofurantoin Monohyd Macro Other (See Comments)    unknown  . Plavix [Clopidogrel Bisulfate] Swelling    Face swells  . Sulfa Antibiotics Other (See Comments)    "face got puffy"  . Amitriptyline Other (See Comments)    Made her "talk out of her head" and sleep until lunch the next day.  .  Gabapentin Other (See Comments)    Made her "talk out of her head".  Truman Hayward [Cefdinir] Nausea And Vomiting and Rash  . Penicillins Rash    Has patient had a PCN reaction causing immediate rash, facial/tongue/throat swelling, SOB or lightheadedness with hypotension: Yes Has patient had a PCN reaction causing severe rash involving mucus membranes or skin necrosis: No Has patient had a PCN reaction that required hospitalization: No Has patient had a PCN reaction occurring within the last 10 years: No If all of the above answers are "NO", then may proceed with Cephalosporin use.     Social History   Socioeconomic History  . Marital status: Married    Spouse name: Not on file  . Number of children: 2  . Years of education: Not on file  . Highest education level: Not on file  Occupational History  . Occupation: Retired  Tobacco Use  . Smoking status: Never Smoker  . Smokeless tobacco: Never Used  . Tobacco comment: Tobacco use-no  Vaping Use  . Vaping Use: Never used  Substance and Sexual Activity  . Alcohol use: No    Alcohol/week: 0.0 standard drinks  . Drug use: No  . Sexual activity: Not on file  Other Topics Concern  . Not on file  Social History Narrative   Lives with her husband - one level living, handicap accessible home - Son and daughter both live very close by.   Social Determinants of Health   Financial Resource Strain: Low Risk   . Difficulty of Paying Living Expenses: Not hard at all  Food Insecurity: No Food Insecurity  . Worried About Programme researcher, broadcasting/film/video in the Last Year: Never true  . Ran Out of Food in the Last Year: Never true  Transportation Needs: No Transportation Needs  . Lack of Transportation (Medical): No  . Lack of Transportation (Non-Medical): No  Physical Activity: Insufficiently Active  . Days of Exercise per Week: 7 days  . Minutes of Exercise per Session: 10 min  Stress: No Stress Concern Present  . Feeling of Stress : Not at all   Social Connections: Unknown  . Frequency of Communication with Friends and Family: Twice a week  . Frequency of Social Gatherings with Friends and Family: Twice a week  . Attends Religious Services: Not on file  . Active Member of Clubs or Organizations: Yes  . Attends Banker Meetings: More than 4 times per year  . Marital Status: Married  Catering manager Violence: Not At Risk  . Fear of Current or Ex-Partner: No  . Emotionally Abused: No  . Physically Abused: No  . Sexually Abused: No     ROS- All systems are reviewed and negative except as per the HPI above.  Physical Exam: Vitals:   03/11/21 1401  BP: 132/80  Pulse: (!) 47  Weight: 87.4 kg  Height: 5\' 7"  (1.702 m)    GEN-  The patient is a well appearing obese elderly female, alert and oriented x 3 today.   HEENT-head normocephalic, atraumatic, sclera clear, conjunctiva pink, hearing intact, trachea midline. Lungs- Clear to ausculation bilaterally, normal work of breathing Heart- Regular rate and rhythm, bradycardia, no murmurs, rubs or gallops  GI- soft, NT, ND, + BS Extremities- no clubbing, cyanosis, or edema MS- no significant deformity or atrophy Skin- no rash or lesion Psych- euthymic mood, full affect Neuro- strength and sensation are intact   Wt Readings from Last 3 Encounters:  03/11/21 87.4 kg  03/08/21 89.8 kg  03/07/21 92.1 kg    EKG today demonstrates  SB Vent. rate 47 BPM PR interval 152 ms QRS duration 72 ms QT/QTcB 568/502 ms  Echo 08/02/18 demonstrated  Left ventricle: The cavity size was normal. Wall thickness was  increased increased in a pattern of mild to moderate LVH.  Systolic function was normal. The estimated ejection fraction was  in the range of 60% to 65%. Wall motion was normal; there were no  regional wall motion abnormalities. Doppler parameters are  consistent with abnormal left ventricular relaxation (grade 1  diastolic dysfunction).  - Aortic  valve: Moderately calcified annulus. Trileaflet.  - Mitral valve: Mildly calcified annulus. There was trivial  regurgitation.  - Right atrium: Central venous pressure (est): 3 mm Hg.  - Atrial septum: No defect or patent foramen ovale was identified.  - Tricuspid valve: There was trivial regurgitation.  - Pulmonary arteries: Systolic pressure could not be accurately  estimated.  - Pericardium, extracardiac: There was no pericardial effusion.   Epic records are reviewed at length today  CHA2DS2-VASc Score = 4  The patient's score is based upon: CHF History: No HTN History: Yes Diabetes History: No Stroke History: No Vascular Disease History: No Age Score: 2 Gender Score: 1      ASSESSMENT AND PLAN: 1. Persistent Atrial Fibrillation (ICD10:  I48.19) The patient's CHA2DS2-VASc score is 4, indicating a 4.8% annual risk of stroke.   Patient converted to SR without DCCV. She appears to be maintaining SR. Decrease Toprol to 50 mg daily Continue amiodarone 200 mg daily Continue Eliquis 5 mg BID  2. Secondary Hypercoagulable State (ICD10:  D68.69) The patient is at significant risk for stroke/thromboembolism based upon her CHA2DS2-VASc Score of 4.  Continue Apixaban (Eliquis).   3. Obesity Body mass index is 30.17 kg/m. Lifestyle modification was discussed and encouraged including regular physical activity and weight reduction.  4. HTN Stable, no changes today.   Follow up with Nena Polio as scheduled.    Jorja Loa PA-C [facility] [address] [place], [place] [address] [phone] 03/11/2021 2:11 PM

## 2021-03-11 NOTE — Addendum Note (Signed)
Encounter addended by: Danice Goltz, PA on: 03/11/2021 4:13 PM  Actions taken: Clinical Note Signed

## 2021-03-20 ENCOUNTER — Other Ambulatory Visit: Payer: Self-pay | Admitting: Family Medicine

## 2021-03-20 DIAGNOSIS — I10 Essential (primary) hypertension: Secondary | ICD-10-CM

## 2021-03-22 ENCOUNTER — Other Ambulatory Visit: Payer: Self-pay | Admitting: Cardiology

## 2021-03-26 ENCOUNTER — Ambulatory Visit: Payer: Medicare Other | Admitting: Orthopaedic Surgery

## 2021-04-03 ENCOUNTER — Ambulatory Visit: Payer: Medicare Other | Admitting: Family Medicine

## 2021-04-09 ENCOUNTER — Encounter: Payer: Self-pay | Admitting: Orthopaedic Surgery

## 2021-04-09 ENCOUNTER — Ambulatory Visit (INDEPENDENT_AMBULATORY_CARE_PROVIDER_SITE_OTHER): Payer: Medicare Other | Admitting: Orthopaedic Surgery

## 2021-04-09 ENCOUNTER — Other Ambulatory Visit: Payer: Self-pay

## 2021-04-09 VITALS — BP 165/59 | HR 62 | Ht 66.0 in | Wt 190.4 lb

## 2021-04-09 DIAGNOSIS — G8929 Other chronic pain: Secondary | ICD-10-CM | POA: Diagnosis not present

## 2021-04-09 DIAGNOSIS — Z7901 Long term (current) use of anticoagulants: Secondary | ICD-10-CM | POA: Diagnosis not present

## 2021-04-09 DIAGNOSIS — M05712 Rheumatoid arthritis with rheumatoid factor of left shoulder without organ or systems involvement: Secondary | ICD-10-CM

## 2021-04-09 DIAGNOSIS — M05711 Rheumatoid arthritis with rheumatoid factor of right shoulder without organ or systems involvement: Secondary | ICD-10-CM

## 2021-04-09 DIAGNOSIS — M25512 Pain in left shoulder: Secondary | ICD-10-CM

## 2021-04-09 NOTE — Progress Notes (Signed)
I am doing well today.  She has had problems with the left shoulder.  She fell a few months ago.  She was seen at [facility] and had X-rays on 02-21-21.  She has calcification of supraspinatus tendon but no fracture.  I have reviewed the X-rays and the notes from [place].  I have independently reviewed and interpreted x-rays of this patient done at another site by another physician or qualified health professional.    She cannot walk very well and is confined to wheelchair most of the time.   She says her shoulder has gotten much better. She has full ROM. She has no pain. She has crepitus of the shoulder but it does not hurt.  She has good grips, NV intact.  She is doing well.  Encounter Diagnoses  Name Primary?  . Chronic left shoulder pain Yes  . Chronic anticoagulation   . Rheumatoid arthritis involving both shoulders with positive rheumatoid factor (HCC)    There is really nothing to treat today.  I will see as needed.  Call if any problem.  Precautions discussed.   Electronically Signed Darreld Mclean, MD 5/24/20222:09 PM

## 2021-04-17 ENCOUNTER — Telehealth: Payer: Self-pay | Admitting: Cardiology

## 2021-04-18 ENCOUNTER — Other Ambulatory Visit: Payer: Self-pay | Admitting: *Deleted

## 2021-04-18 MED ORDER — LISINOPRIL 20 MG PO TABS: 1.0000 | ORAL_TABLET | Freq: Every day | ORAL | 1 refills | Status: DC

## 2021-04-18 NOTE — Telephone Encounter (Signed)
Pt called stating that [facility] never received this refill - stated she called this morning and they don't have anything for her.

## 2021-04-21 NOTE — Progress Notes (Signed)
Cardiology Office Note  Date: 04/22/2021   ID: Courtney Berry, DOB Aug 24, 1933, MRN [id number]  PCP:  Gwenlyn Fudge, FNP  Cardiologist:  Nona Dell, MD Electrophysiologist:  None   Chief Complaint: 3 month follow up  History of Present Illness: Courtney Berry is a 87 y.o. female with a history of PAF, COPD, HLD, RA, hx of chest pain, HTN.  Previously seen by Dr. Diona Browner on 01/17/2021 with plan for amiodarone load and ultimately cardioversion.  She did not report any progressive symptoms.  Heart rate had been under 100 on checks at home.  Her EKG on that visit showed atrial fibrillation with a rate of 105 and nonspecific ST/T changes.  She was tolerating her medications including recent initiation of amiodarone.  She had no bleeding problems on Eliquis.  She was continuing compression wraps for chronic lymphedema.  Blood pressure was well controlled.  She was last seen at [facility] on 4.25.2022 by Mr [place]. She had presented for DCCV and was back in NSR and procedure was cancelled. She denied any palpitations, chest pain, orthopnea, PND, LE edema, presyncope or syncope, snoring or daytime somnolence. She was tolerating her medications. CHA2VASC score 4. She was to continue Amiodarone 200 mg po daily and Eliquis 5 mg po bid. Toprol XL was decreased to 50 mg po daily.   She presents today with no particular complaints.  She denies any palpitations or arrhythmias.  No anginal symptoms.  No DOE or SOB.  No orthostatic symptoms.  Denies any bleeding issues on Eliquis.  She continues in sinus rhythm with EKG demonstrating sinus bradycardia with a rate of 54 nonspecific ST and T wave abnormality, prolonged QT.  She continues on amiodarone 200 mg daily, Eliquis 5 mg p.o. twice daily, pro XL 50 mg daily, lisinopril 20 mg daily.  She states she is having more problems with her legs and she is anything else.  She has chronic lymphedema and has been recently wearing compression  wraps.  Past Medical History:  Diagnosis Date  . COPD (chronic obstructive pulmonary disease) (HCC)   . Diverticulosis   . Essential hypertension   . History of chest pain    Low risk Cardiolite 2010  . Lymphedema   . Mixed hyperlipidemia   . PAF (paroxysmal atrial fibrillation) (HCC)   . Rheumatoid arthritis [facility])     Past Surgical History:  Procedure Laterality Date  . TOTAL ABDOMINAL HYSTERECTOMY  1969    Current Outpatient Medications  Medication Sig Dispense Refill  . acetaminophen (TYLENOL) 500 MG tablet Take 1,000 mg by mouth every 6 (six) hours as needed for moderate pain.    Marland Kitchen amiodarone (PACERONE) 200 MG tablet Take 1 tablet (200 mg total) by mouth daily. 60 tablet 6  . Bismuth Tribromoph-Petrolatum (XEROFORM PETROLAT GAUZE 5"X9") MISC APPLY 1 APPLICATION TOPICALLY DAILY. (BOTH LEGS) 30 each 2  . DULoxetine (CYMBALTA) 30 MG capsule Take 30 mg by mouth daily.    Marland Kitchen ELIQUIS 5 MG TABS tablet TAKE 1 TABLET BY MOUTH TWICE A DAY 60 tablet 11  . fluticasone (FLONASE) 50 MCG/ACT nasal spray Place 2 sprays into both nostrils daily. 16 g 6  . lisinopril (ZESTRIL) 20 MG tablet Take 1 tablet (20 mg total) by mouth daily. 90 tablet 1  . metoprolol succinate (TOPROL-XL) 50 MG 24 hr tablet Take 1 tablet (50 mg total) by mouth daily. 60 tablet 6  . PROAIR HFA 108 (90 Base) MCG/ACT inhaler Inhale 1-2 puffs into  the lungs as needed for wheezing or shortness of breath. 18 g 3  . Probiotic Product (PROBIOTIC DAILY PO) Take 1 tablet by mouth daily.    Marland Kitchen umeclidinium-vilanterol (ANORO ELLIPTA) 62.5-25 MCG/INH AEPB Inhale 1 puff into the lungs daily. 60 each 3   No current facility-administered medications for this visit.   Allergies:  Darvocet [propoxyphene n-acetaminophen], Doxycycline, Nitrofurantoin monohyd macro, Plavix [clopidogrel bisulfate], Sulfa antibiotics, Amitriptyline, Gabapentin, Omnicef [cefdinir], and Penicillins   Social History: The patient  reports that she has never  smoked. She has never used smokeless tobacco. She reports that she does not drink alcohol and does not use drugs.   Family History: The patient's family history includes Cancer in her brother and mother; Colon cancer in her father; Heart disease in her sister.   ROS:  Please see the history of present illness. Otherwise, complete review of systems is positive for none.  All other systems are reviewed and negative.   Physical Exam: VS:  BP 128/72   Pulse (!) 53   Ht 5\' 6"  (1.676 m)   Wt 185 lb 12.8 oz (84.3 kg)   SpO2 93%   BMI 29.99 kg/m , BMI Body mass index is 29.99 kg/m.  Wt Readings from Last 3 Encounters:  04/22/21 185 lb 12.8 oz (84.3 kg)  04/09/21 190 lb 6 oz (86.4 kg)  03/11/21 192 lb 9.6 oz (87.4 kg)    General: Patient appears comfortable at rest. Neck: Supple, no elevated JVP or carotid bruits, no thyromegaly. Lungs: Clear to auscultation, nonlabored breathing at rest. Cardiac: Regular rate and rhythm, no S3 or significant systolic murmur, no pericardial rub. Extremities: No pitting edema, distal pulses 2+. Skin: Warm and dry. Musculoskeletal: No kyphosis. Neuropsychiatric: Alert and oriented x3, affect grossly appropriate.  ECG:  An ECG dated 04/22/2021 was personally reviewed today and demonstrated:  Sinus bradycardia with a rate of 54, nonspecific ST and T wave abnormality, prolonged QT  Recent Labwork: 02/04/2021: ALT 17; AST 22; TSH 3.798 02/19/2021: BUN 13; Creatinine, Ser 1.11; Hemoglobin 10.9; Platelets 171; Potassium 4.9; Sodium 143     Component Value Date/Time   CHOL 141 08/29/2020 0835   TRIG 118 08/29/2020 0835   HDL 50 08/29/2020 0835   CHOLHDL 2.8 08/29/2020 0835   CHOLHDL 3.7 08/02/2018 0417   VLDL 38 08/02/2018 0417   LDLCALC 70 08/29/2020 0835    Other Studies Reviewed Today:  Echo 08/02/18  Left ventricle: The cavity size was normal. Wall thickness was  increased increased in a pattern of mild to moderate LVH.  Systolic function was  normal. The estimated ejection fraction was  in the range of 60% to 65%. Wall motion was normal; there were no  regional wall motion abnormalities. Doppler parameters are  consistent with abnormal left ventricular relaxation (grade 1  diastolic dysfunction).  - Aortic valve: Moderately calcified annulus. Trileaflet.  - Mitral valve: Mildly calcified annulus. There was trivial  regurgitation.  - Right atrium: Central venous pressure (est): 3 mm Hg.  - Atrial septum: No defect or patent foramen ovale was identified.  - Tricuspid valve: There was trivial regurgitation.  - Pulmonary arteries: Systolic pressure could not be accurately  estimated.  - Pericardium, extracardiac: There was no pericardial effusion.    Assessment and Plan:  1. Persistent atrial fibrillation (HCC)   2. Essential hypertension     1. Persistent atrial fibrillation (HCC) Continues in normal sinus rhythm since starting amiodarone.  EKG today shows sinus bradycardia with rate of 54, nonspecific  ST and T wave abnormality, prolonged QT with QT/QTc 542/513 ms.  Continue amiodarone 200 mg daily.  Continue Toprol-XL 50 mg daily.  Continue Eliquis 5 mg p.o. twice daily.  2. Essential hypertension Blood pressure well controlled on current medications.  Continue lisinopril 20 mg daily.  Continue Toprol-XL 50 mg daily.   Medication Adjustments/Labs and Tests Ordered: Current medicines are reviewed at length with the patient today.  Concerns regarding medicines are outlined above.   Disposition: Follow-up with Dr. Diona Browner or APP 6 months  Signed, Rennis Harding, NP 04/22/2021 2:00 PM    [facility] at [facility] [address] [place], [place], [place] [address] Phone: [phone]; Fax: [phone]

## 2021-04-22 ENCOUNTER — Ambulatory Visit (INDEPENDENT_AMBULATORY_CARE_PROVIDER_SITE_OTHER): Payer: Medicare Other | Admitting: Family Medicine

## 2021-04-22 ENCOUNTER — Encounter: Payer: Self-pay | Admitting: Family Medicine

## 2021-04-22 VITALS — BP 128/72 | HR 53 | Ht 66.0 in | Wt 185.8 lb

## 2021-04-22 DIAGNOSIS — I4819 Other persistent atrial fibrillation: Secondary | ICD-10-CM | POA: Diagnosis not present

## 2021-04-22 DIAGNOSIS — I10 Essential (primary) hypertension: Secondary | ICD-10-CM

## 2021-04-22 DIAGNOSIS — D6869 Other thrombophilia: Secondary | ICD-10-CM

## 2021-04-22 NOTE — Patient Instructions (Addendum)
Medication Instructions:  Continue all current medications.   Labwork: none  Testing/Procedures: none  Follow-Up: 6 months   Any Other Special Instructions Will Be Listed Below (If Applicable).   If you need a refill on your cardiac medications before your next appointment, please call your pharmacy.  

## 2021-07-01 ENCOUNTER — Other Ambulatory Visit (HOSPITAL_COMMUNITY): Payer: Self-pay

## 2021-07-01 MED ORDER — APIXABAN 5 MG PO TABS: 5.0000 mg | ORAL_TABLET | Freq: Two times a day (BID) | ORAL | 0 refills | Status: DC

## 2021-07-16 ENCOUNTER — Telehealth: Payer: Self-pay | Admitting: Cardiology

## 2021-07-16 NOTE — Telephone Encounter (Signed)
Per daughter, patient c/o dizziness for 3 weeks and has not been evaluated by a provider for this. Reports last week patient c/o chest pain and sob. Denies chest pain or sob since one episode last week. Denies fever, n/v or diarrhea. Reports not eating well and has lost weight. Reports drinking well. BP checked today and was 137/49 w/HR 41 by home digital monitor. Medications reviewed.  Offered nurse visit today but declined. Nurse visit arranged for vitals to compare home bp monitor and EKG for tomorrow at 12 noon. First available appointment arranged for 07/25/21 @1 :30 pm with . Advised if symptoms get worse, to go to the ED for an evaluation. Verbalized understanding of plan.

## 2021-07-16 NOTE — Telephone Encounter (Signed)
Courtney Berry -daughter called stating that patient continues to have dizzy spells, States that she almost fell last evening.  BP 137/49 P 41 (1000 am today)  States that BP was running high but didn't have readings with her.  Was in question if any heart medications could be causing the dizziness.  [phone].

## 2021-07-17 ENCOUNTER — Other Ambulatory Visit: Payer: Self-pay

## 2021-07-17 ENCOUNTER — Encounter: Payer: Self-pay | Admitting: *Deleted

## 2021-07-17 ENCOUNTER — Ambulatory Visit (INDEPENDENT_AMBULATORY_CARE_PROVIDER_SITE_OTHER): Payer: Medicare Other | Admitting: *Deleted

## 2021-07-17 VITALS — BP 190/80 | HR 60 | Ht 66.0 in | Wt 181.4 lb

## 2021-07-17 DIAGNOSIS — I4819 Other persistent atrial fibrillation: Secondary | ICD-10-CM

## 2021-07-17 MED ORDER — AMIODARONE HCL 200 MG PO TABS: 100.0000 mg | ORAL_TABLET | Freq: Every day | ORAL | 3 refills | Status: DC

## 2021-07-17 MED ORDER — METOPROLOL SUCCINATE ER 25 MG PO TB24: 25.0000 mg | ORAL_TABLET | Freq: Every day | ORAL | 3 refills | Status: DC

## 2021-07-17 NOTE — Progress Notes (Signed)
Daughter, Harriette Ohara informed and verbalized understanding of plan

## 2021-07-17 NOTE — Patient Instructions (Signed)
We will call you with any changes

## 2021-07-17 NOTE — Progress Notes (Signed)
Presents to office per recent phone note for vitals and ekg due to low HR and dizziness. Reports taking all medications as prescribed without missing doses. Reports continued dizziness when extending head backwards or standing.  Sitting to standing BP taken Denies chest pain or sob EKG done and routed to provider for review Vitals taken Compared home BP monitor with BP 183/80 & HR 44 Medications reviewed

## 2021-07-17 NOTE — Progress Notes (Signed)
Patient informed and verbalized understanding of plan. 

## 2021-07-24 NOTE — Progress Notes (Addendum)
Cardiology Office Note  Date: 07/25/2021   ID: Courtney Berry, DOB April 18, 1933, MRN [id number]  PCP:  Loman Brooklyn, FNP  Cardiologist:  Rozann Lesches, MD Electrophysiologist:  None   Chief Complaint: Complaints of dizziness, elevated blood pressure, heart rate 41-44 recently per telephone encounters with nursing staff.  History of Present Illness: Courtney Berry is a 87 y.o. female with a history of PAF, COPD, HLD, RA, hx of chest pain, HTN.  Previously seen by Dr. Domenic Polite on 01/17/2021 with plan for amiodarone load and ultimately cardioversion.  She did not report any progressive symptoms.  Heart rate had been under 100 on checks at home.  Her EKG on that visit showed atrial fibrillation with a rate of 105 and nonspecific ST/T changes.  She was tolerating her medications including recent initiation of amiodarone.  She had no bleeding problems on Eliquis.  She was continuing compression wraps for chronic lymphedema.  Blood pressure was well controlled.  She was previously seen at [facility] on 4.25.2022 by Mr [place]. She had presented for DCCV and was back in NSR and procedure was cancelled. She denied any palpitations, chest pain, orthopnea, PND, LE edema, presyncope or syncope, snoring or daytime somnolence. She was tolerating her medications. CHA2VASC score 4. She was to continue Amiodarone 200 mg po daily and Eliquis 5 mg po bid. Toprol XL was decreased to 50 mg po daily.   She is here today with complaints of dizziness.  She denies any near syncopal or syncopal episodes.  Heart rate remains in the low 50s.  Her Toprol was decreased to 25 mg p.o. daily and amiodarone decreased to 100 mg daily.  She continues to complain of dizziness.  She states she believes she felt better on the previous dosage of medications.  Blood pressure is elevated today at 175/80.  Recheck in left arm was the same.  He states she is taken all of her antihypertensive medications today.  She  denies any palpitations or arrhythmias, CVA or TIA-like symptoms, DOE or SOB.  No anginal symptoms.  No PND, orthopnea.  No claudication-like symptoms, DVT or PE-like symptoms.  Past Medical History:  Diagnosis Date   COPD (chronic obstructive pulmonary disease) ([place])    Diverticulosis    Essential hypertension    History of chest pain    Low risk Cardiolite 2010   Lymphedema    Mixed hyperlipidemia    PAF (paroxysmal atrial fibrillation) ([facility])    Rheumatoid arthritis ([place])     Past Surgical History:  Procedure Laterality Date   TOTAL ABDOMINAL HYSTERECTOMY  1969    Current Outpatient Medications  Medication Sig Dispense Refill   acetaminophen (TYLENOL) 500 MG tablet Take 1,000 mg by mouth every 6 (six) hours as needed for moderate pain.     amiodarone (PACERONE) 200 MG tablet Take 0.5 tablets (100 mg total) by mouth daily. 15 tablet 3   apixaban (ELIQUIS) 5 MG TABS tablet Take 1 tablet (5 mg total) by mouth 2 (two) times daily. 56 tablet 0   DULoxetine (CYMBALTA) 30 MG capsule Take 30 mg by mouth daily.     fluticasone (FLONASE) 50 MCG/ACT nasal spray Place 2 sprays into both nostrils daily. 16 g 6   lisinopril (ZESTRIL) 20 MG tablet Take 1 tablet (20 mg total) by mouth daily. 90 tablet 1   metoprolol succinate (TOPROL XL) 25 MG 24 hr tablet Take 1 tablet (25 mg total) by mouth daily. 30 tablet 3  PROAIR HFA 108 (90 Base) MCG/ACT inhaler Inhale 1-2 puffs into the lungs as needed for wheezing or shortness of breath. 18 g 3   Probiotic Product (PROBIOTIC DAILY PO) Take 1 tablet by mouth daily.     umeclidinium-vilanterol (ANORO ELLIPTA) 62.5-25 MCG/INH AEPB Inhale 1 puff into the lungs daily. 60 each 3   Bismuth Tribromoph-Petrolatum (XEROFORM PETROLAT GAUZE 5"X9") MISC APPLY 1 APPLICATION TOPICALLY DAILY. (BOTH LEGS) (Patient not taking: Reported on 07/25/2021) 30 each 2   No current facility-administered medications for this visit.   Allergies:  Darvocet [propoxyphene  n-acetaminophen], Doxycycline, Nitrofurantoin monohyd macro, Plavix [clopidogrel bisulfate], Sulfa antibiotics, Amitriptyline, Gabapentin, Omnicef [cefdinir], and Penicillins   Social History: The patient  reports that she has never smoked. She has never used smokeless tobacco. She reports that she does not drink alcohol and does not use drugs.   Family History: The patient's family history includes Cancer in her brother and mother; Colon cancer in her father; Heart disease in her sister.   ROS:  Please see the history of present illness. Otherwise, complete review of systems is positive for none.  All other systems are reviewed and negative.   Physical Exam: VS:  BP (!) 175/80   Pulse (!) 52   Ht 5' 6" (1.676 m)   Wt 189 lb (85.7 kg)   SpO2 99%   BMI 30.51 kg/m , BMI Body mass index is 30.51 kg/m.  Wt Readings from Last 3 Encounters:  07/25/21 189 lb (85.7 kg)  07/17/21 181 lb 6.4 oz (82.3 kg)  04/22/21 185 lb 12.8 oz (84.3 kg)    General: Patient appears comfortable at rest. Neck: Supple, no elevated JVP or carotid bruits, no thyromegaly. Lungs: Clear to auscultation, nonlabored breathing at rest. Cardiac: Regular rate and rhythm, no S3 or significant systolic murmur, no pericardial rub. Extremities: No pitting edema, distal pulses 2+. Skin: Warm and dry. Musculoskeletal: No kyphosis. Neuropsychiatric: Alert and oriented x3, affect grossly appropriate.  ECG:  An ECG dated 04/22/2021 was personally reviewed today and demonstrated:  Sinus bradycardia with a rate of 54, nonspecific ST and T wave abnormality, prolonged QT  Recent Labwork: 02/04/2021: ALT 17; AST 22; TSH 3.798 02/19/2021: BUN 13; Creatinine, Ser 1.11; Hemoglobin 10.9; Platelets 171; Potassium 4.9; Sodium 143     Component Value Date/Time   CHOL 141 08/29/2020 0835   TRIG 118 08/29/2020 0835   HDL 50 08/29/2020 0835   CHOLHDL 2.8 08/29/2020 0835   CHOLHDL 3.7 08/02/2018 0417   VLDL 38 08/02/2018 0417   LDLCALC 70  08/29/2020 0835    Other Studies Reviewed Today:  Echo 08/02/18  Left ventricle: The cavity size was normal. Wall thickness was    increased increased in a pattern of mild to moderate LVH.    Systolic function was normal. The estimated ejection fraction was    in the range of 60% to 65%. Wall motion was normal; there were no    regional wall motion abnormalities. Doppler parameters are    consistent with abnormal left ventricular relaxation (grade 1    diastolic dysfunction).  - Aortic valve: Moderately calcified annulus. Trileaflet.  - Mitral valve: Mildly calcified annulus. There was trivial    regurgitation.  - Right atrium: Central venous pressure (est): 3 mm Hg.  - Atrial septum: No defect or patent foramen ovale was identified.  - Tricuspid valve: There was trivial regurgitation.  - Pulmonary arteries: Systolic pressure could not be accurately    estimated.  - Pericardium, extracardiac: There  was no pericardial effusion.    Assessment and Plan:  1. Dizziness   2. Heart rate slow   3. Persistent atrial fibrillation ([place])   4. Essential hypertension     1.  Dizziness Complaining of dizziness.  She states she believes this has worsened since her medication dosages were reduced in the interim since last visit her amiodarone was decreased to 100 mg and Toprol was decreased to 25 mg.  2.  Slow heart rate Heart rate remains low today at a rate of 52.  We will stop Toprol XL and change to metoprolol tartrate 12.5 mg p.o. twice daily.  3.  Persistent atrial fibrillation (HCC) Continues in normal sinus rhythm since starting amiodarone.  Heart rate today 52 in spite of decreasing metoprolol and amiodarone dosage in the interim since last visit.  Continue amiodarone 100 mg daily.  Changing Toprol to Metoprolol 12.5 mg po bid due to slow heart rate .  Continue Eliquis 5 mg p.o. twice daily.  4.  Essential hypertension Blood pressure elevated today at 175/80.  Recheck in left arm  175/80.  Increase lisinopril to 40 mg daily.  We are changing Toprol-XL to metoprolol tartrate 12.5 mg p.o. twice daily due to slow heart rate in spite of decreasing amiodarone dosage and Toprol in the interval since last visit.  Advised her to continue to check blood pressures and we will follow-up in 4 weeks.  Please get a repeat be met and magnesium in 2 weeks after increasing lisinopril  Medication Adjustments/Labs and Tests Ordered: Current medicines are reviewed at length with the patient today.  Concerns regarding medicines are outlined above.   Disposition: Follow-up with Dr. Domenic Polite or APP 1 month  Signed, Levell July, NP 07/25/2021 1:36 PM    [facility] at [place], [place], [place] [address] Phone: [phone]; Fax: [phone]

## 2021-07-25 ENCOUNTER — Ambulatory Visit (INDEPENDENT_AMBULATORY_CARE_PROVIDER_SITE_OTHER): Payer: Medicare Other | Admitting: Family Medicine

## 2021-07-25 ENCOUNTER — Encounter: Payer: Self-pay | Admitting: Family Medicine

## 2021-07-25 VITALS — BP 175/80 | HR 52 | Ht 66.0 in | Wt 189.0 lb

## 2021-07-25 DIAGNOSIS — I10 Essential (primary) hypertension: Secondary | ICD-10-CM

## 2021-07-25 DIAGNOSIS — R42 Dizziness and giddiness: Secondary | ICD-10-CM

## 2021-07-25 DIAGNOSIS — R001 Bradycardia, unspecified: Secondary | ICD-10-CM

## 2021-07-25 DIAGNOSIS — I4819 Other persistent atrial fibrillation: Secondary | ICD-10-CM

## 2021-07-25 MED ORDER — LISINOPRIL 40 MG PO TABS: 40.0000 mg | ORAL_TABLET | Freq: Every day | ORAL | 6 refills | Status: DC

## 2021-07-25 MED ORDER — METOPROLOL TARTRATE 25 MG PO TABS: 12.5000 mg | ORAL_TABLET | Freq: Two times a day (BID) | ORAL | 6 refills | Status: DC

## 2021-07-25 NOTE — Patient Instructions (Addendum)
Medication Instructions:  Stop Toprol XL (Metoprolol Succ.) Begin Lopressor 12.5mg  twice a day. Increase Lisinopril 40mg  daily.  Continue all other medications.     Labwork: BMET, Mg - orders given today.  Please do in 2 weeks (around 08/08/2021).   Office will contact with results via phone or letter.    Testing/Procedures: none  Follow-Up: 1 month   Any Other Special Instructions Will Be Listed Below (If Applicable).   If you need a refill on your cardiac medications before your next appointment, please call your pharmacy.

## 2021-07-25 NOTE — Addendum Note (Signed)
Addended by: Lesle Chris on: 07/25/2021 02:13 PM   Modules accepted: Orders

## 2021-08-21 NOTE — Progress Notes (Signed)
Cardiology Office Note  Date: 08/22/2021   ID: Courtney Berry, DOB August 23, 1933, MRN [id number]  PCP:  Gwenlyn Fudge, FNP  Cardiologist:  Nona Dell, MD Electrophysiologist:  None   Chief Complaint: Complaints of dizziness, elevated blood pressure, heart rate 41-44 recently per telephone encounters with nursing staff.  History of Present Illness: Courtney Berry is a 87 y.o. female with a history of PAF, COPD, HLD, RA, hx of chest pain, HTN.  Previously seen by Dr. Diona Browner on 01/17/2021 with plan for amiodarone load and ultimately cardioversion.  She did not report any progressive symptoms.  Heart rate had been under 100 on checks at home.  Her EKG on that visit showed atrial fibrillation with a rate of 105 and nonspecific ST/T changes.  She was tolerating her medications including recent initiation of amiodarone.  She had no bleeding problems on Eliquis.  She was continuing compression wraps for chronic lymphedema.  Blood pressure was well controlled.  [facility] by Mr [place]. She had presented for DCCV and was back in NSR and procedure was cancelled. She denied any palpitations, chest pain, orthopnea, PND, LE edema, presyncope or syncope, snoring or daytime somnolence. She was tolerating her medications. CHA2VASC score 4. She was to continue Amiodarone 200 mg po daily and Eliquis 5 mg po bid. Toprol XL was decreased to 50 mg po daily.   Was last here with complaints of dizziness.  She denied any near syncopal or syncopal episodes.  Heart rate was in the low 50s.  Her Toprol was decreased to 25 mg p.o. daily and amiodarone decreased to 100 mg daily previously.  She continued to complain of dizziness.  States she believes she felt better on the previous dosage of medications.  Blood pressure was elevated at 175/80.  Recheck in left arm was the same.  She had been taken off all of her  antihypertensive medications.  She denied any  palpitations or arrhythmias, CVA or TIA-like symptoms, DOE or SOB.  No anginal symptoms.  No PND, orthopnea.  No claudication-like symptoms, DVT or PE-like symptoms.  She is here today for follow-up.  She states she was unaware she needed to have lab work after last visit.  Blood pressure has come down significantly since last visit after increasing lisinopril dosage to 40 mg daily..  Blood pressure today is 144/80.  Previously it was 175/80.  She has a log of blood pressures which showed her home blood pressures.  Have improved significantly.  Some measurements show pressures show systolic blood pressure in the 120 range.  Heart rate has increased from last visit since changing Toprol to metoprolol.  Heart rate today is 60.  She states she will have lab work done today after leaving the clinic.   Past Medical History:  Diagnosis Date   COPD (chronic obstructive pulmonary disease) ([facility])    Diverticulosis    Essential hypertension    History of chest pain    Low risk Cardiolite 2010   Lymphedema    Mixed hyperlipidemia    PAF (paroxysmal atrial fibrillation) ([facility])    Rheumatoid arthritis ([facility])     Past Surgical History:  Procedure Laterality Date   TOTAL ABDOMINAL HYSTERECTOMY  1969    Current Outpatient Medications  Medication Sig Dispense Refill   acetaminophen (TYLENOL) 500 MG tablet Take 1,000 mg by mouth every 6 (six) hours as needed for moderate pain.     amiodarone (PACERONE) 200  MG tablet Take 0.5 tablets (100 mg total) by mouth daily. 15 tablet 3   apixaban (ELIQUIS) 5 MG TABS tablet Take 1 tablet (5 mg total) by mouth 2 (two) times daily. 56 tablet 0   Bismuth Tribromoph-Petrolatum (XEROFORM PETROLAT GAUZE 5"X9") MISC APPLY 1 APPLICATION TOPICALLY DAILY. (BOTH LEGS) 30 each 2   DULoxetine (CYMBALTA) 30 MG capsule Take 30 mg by mouth daily.     fluticasone (FLONASE) 50 MCG/ACT nasal spray Place 2 sprays into both nostrils daily. 16 g 6   lisinopril (ZESTRIL) 40 MG tablet Take 1  tablet (40 mg total) by mouth daily. 30 tablet 6   metoprolol tartrate (LOPRESSOR) 25 MG tablet Take 0.5 tablets (12.5 mg total) by mouth 2 (two) times daily. 30 tablet 6   PROAIR HFA 108 (90 Base) MCG/ACT inhaler Inhale 1-2 puffs into the lungs as needed for wheezing or shortness of breath. 18 g 3   Probiotic Product (PROBIOTIC DAILY PO) Take 1 tablet by mouth daily.     umeclidinium-vilanterol (ANORO ELLIPTA) 62.5-25 MCG/INH AEPB Inhale 1 puff into the lungs daily. 60 each 3   No current facility-administered medications for this visit.   Allergies:  Darvocet [propoxyphene n-acetaminophen], Doxycycline, Nitrofurantoin monohyd macro, Plavix [clopidogrel bisulfate], Sulfa antibiotics, Amitriptyline, Gabapentin, Omnicef [cefdinir], and Penicillins   Social History: The patient  reports that she has never smoked. She has never used smokeless tobacco. She reports that she does not drink alcohol and does not use drugs.   Family History: The patient's family history includes Cancer in her brother and mother; Colon cancer in her father; Heart disease in her sister.   ROS:  Please see the history of present illness. Otherwise, complete review of systems is positive for none.  All other systems are reviewed and negative.   Physical Exam: VS:  BP (!) 144/80   Pulse 60   Ht 5\' 6"  (1.676 m)   Wt 182 lb (82.6 kg)   SpO2 98%   BMI 29.38 kg/m , BMI Body mass index is 29.38 kg/m.  Wt Readings from Last 3 Encounters:  08/22/21 182 lb (82.6 kg)  07/25/21 189 lb (85.7 kg)  07/17/21 181 lb 6.4 oz (82.3 kg)    General: Patient appears comfortable at rest. Neck: Supple, no elevated JVP or carotid bruits, no thyromegaly. Lungs: Clear to auscultation, nonlabored breathing at rest. Cardiac: Regular rate and rhythm, no S3 or significant systolic murmur, no pericardial rub. Extremities: No pitting edema, distal pulses 2+. Skin: Warm and dry. Musculoskeletal: No kyphosis. Neuropsychiatric: Alert and  oriented x3, affect grossly appropriate.  ECG:  An ECG dated 04/22/2021 was personally reviewed today and demonstrated:  Sinus bradycardia with a rate of 54, nonspecific ST and T wave abnormality, prolonged QT  Recent Labwork: 02/04/2021: ALT 17; AST 22; TSH 3.798 02/19/2021: BUN 13; Creatinine, Ser 1.11; Hemoglobin 10.9; Platelets 171; Potassium 4.9; Sodium 143     Component Value Date/Time   CHOL 141 08/29/2020 0835   TRIG 118 08/29/2020 0835   HDL 50 08/29/2020 0835   CHOLHDL 2.8 08/29/2020 0835   CHOLHDL 3.7 08/02/2018 0417   VLDL 38 08/02/2018 0417   LDLCALC 70 08/29/2020 0835    Other Studies Reviewed Today:  Echo 08/02/18  Left ventricle: The cavity size was normal. Wall thickness was    increased increased in a pattern of mild to moderate LVH.    Systolic function was normal. The estimated ejection fraction was    in the range of 60% to 65%.  Wall motion was normal; there were no    regional wall motion abnormalities. Doppler parameters are    consistent with abnormal left ventricular relaxation (grade 1    diastolic dysfunction).  - Aortic valve: Moderately calcified annulus. Trileaflet.  - Mitral valve: Mildly calcified annulus. There was trivial    regurgitation.  - Right atrium: Central venous pressure (est): 3 mm Hg.  - Atrial septum: No defect or patent foramen ovale was identified.  - Tricuspid valve: There was trivial regurgitation.  - Pulmonary arteries: Systolic pressure could not be accurately    estimated.  - Pericardium, extracardiac: There was no pericardial effusion.    Assessment and Plan:  1. Dizziness   2. Heart rate slow   3. Persistent atrial fibrillation ([facility])   4. Essential hypertension      1.  Dizziness Currently denies any dizziness.  Blood pressure has improved and heart rate is improved since last visit when we increased lisinopril to 40 mg and change Toprol-XL to metoprolol 12.5 mg p.o. twice daily.  2.  Slow heart rate Heart rate has  increased to 60 since changing Toprol to metoprolol.  Continue metoprolol tartrate 12.5 mg p.o. twice daily.  3.  Persistent atrial fibrillation (HCC) Continues in normal sinus rhythm since starting amiodarone.  Heart rate is controlled at 60 today.  Continue amiodarone 100 mg daily.  Continue metoprolol 12.5 mg po bid.  Continue Eliquis 5 mg p.o. twice daily.  4.  Essential hypertension Blood pressure has improved since increasing lisinopril dose to last visit.  Blood pressure today is 144/80.  Patient brings with her a log of blood pressure measurements with systolic ranging in the 120s to 130s.  Continue lisinopril to 40 mg daily.  Please get follow-up BMP and magnesium as ordered at prior visit.   Medication Adjustments/Labs and Tests Ordered: Current medicines are reviewed at length with the patient today.  Concerns regarding medicines are outlined above.   Disposition: Follow-up with Dr. Diona Browner or APP 6 months  Signed, Rennis Harding, NP 08/22/2021 2:14 PM    [facility] at [facility] [address] [place], [place], [place] [address] Phone: [phone]; Fax: [phone]

## 2021-08-22 ENCOUNTER — Other Ambulatory Visit: Payer: Self-pay | Admitting: *Deleted

## 2021-08-22 ENCOUNTER — Other Ambulatory Visit: Payer: Self-pay

## 2021-08-22 ENCOUNTER — Encounter: Payer: Self-pay | Admitting: Family Medicine

## 2021-08-22 ENCOUNTER — Ambulatory Visit (INDEPENDENT_AMBULATORY_CARE_PROVIDER_SITE_OTHER): Payer: Medicare Other | Admitting: Family Medicine

## 2021-08-22 VITALS — BP 144/80 | HR 60 | Ht 66.0 in | Wt 182.0 lb

## 2021-08-22 DIAGNOSIS — R42 Dizziness and giddiness: Secondary | ICD-10-CM

## 2021-08-22 DIAGNOSIS — I4819 Other persistent atrial fibrillation: Secondary | ICD-10-CM | POA: Diagnosis not present

## 2021-08-22 DIAGNOSIS — I10 Essential (primary) hypertension: Secondary | ICD-10-CM | POA: Diagnosis not present

## 2021-08-22 DIAGNOSIS — R001 Bradycardia, unspecified: Secondary | ICD-10-CM | POA: Diagnosis not present

## 2021-08-22 MED ORDER — APIXABAN 5 MG PO TABS
5.0000 mg | ORAL_TABLET | Freq: Two times a day (BID) | ORAL | 0 refills | Status: DC
Start: 2021-08-22 — End: 2021-09-18

## 2021-08-22 MED ORDER — APIXABAN 5 MG PO TABS
5.0000 mg | ORAL_TABLET | Freq: Two times a day (BID) | ORAL | 3 refills | Status: DC
Start: 2021-08-22 — End: 2021-08-22

## 2021-08-22 NOTE — Patient Instructions (Signed)

## 2021-08-22 NOTE — Telephone Encounter (Signed)
Reports being in donut hole-last rx on 08/06/2021 was $156/mth supply. October 2022 rx is 142.93/mth supply. Will give samples and BMS PAF application for patient assistance

## 2021-08-22 NOTE — Telephone Encounter (Signed)
Pt request Eliquis 5mg  samples. Indication: PAF Last office visit: 08/22/21  A Quinn FNP Scr: 1.11 on 02/19/21 Age: 87 Weight: 82.6kg  Based on above findings Eliquis 5mg  twice daily is the appropriate dose.  Pt is due repeat BMP.  Lab order given to pt today. OK to give Eliquis samples if available.

## 2021-08-26 DIAGNOSIS — R42 Dizziness and giddiness: Secondary | ICD-10-CM | POA: Diagnosis not present

## 2021-08-29 ENCOUNTER — Telehealth: Payer: Self-pay | Admitting: Family Medicine

## 2021-08-29 DIAGNOSIS — Z79899 Other long term (current) drug therapy: Secondary | ICD-10-CM

## 2021-08-29 MED ORDER — LISINOPRIL 20 MG PO TABS: 20.0000 mg | ORAL_TABLET | Freq: Every day | ORAL | 3 refills | Status: DC

## 2021-08-29 MED ORDER — AMLODIPINE BESYLATE 5 MG PO TABS: 5.0000 mg | ORAL_TABLET | Freq: Every day | ORAL | 3 refills | Status: DC

## 2021-08-29 NOTE — Telephone Encounter (Signed)
Patients daughter is returning call to get her mothers lab results

## 2021-08-29 NOTE — Telephone Encounter (Signed)
Patient returning call regarding lab results  ?

## 2021-08-29 NOTE — Telephone Encounter (Signed)
Daughter [name] notified of results and medication changes.

## 2021-08-29 NOTE — Telephone Encounter (Signed)
I spoke with patient and discussed lab results, medication changes and repeat lab work in 2 weeks. She verbalized understanding and had no questions for me.    I will mail lab slips to her,she have drawn at [facility].

## 2021-08-29 NOTE — Telephone Encounter (Signed)
Netta Neat., NP  08/27/2021  7:58 AM EDT     Patient and let her know the increased dose of the lisinopril appears to have put some stress on her kidneys.  Tell her to decrease the dose back to 20 mg daily.  Tell her we will start amlodipine 5 mg daily to help with blood pressure.  Go ahead and send the amlodipine and just make sure she decreases the dose of lisinopril from 40 back down to 20.  Get a follow-up basic metabolic panel and magnesium in 2 weeks after decreasing the dose.  Thank you   Netta Neat, NP  08/27/2021 7:56 AM

## 2021-09-17 DIAGNOSIS — Z79899 Other long term (current) drug therapy: Secondary | ICD-10-CM | POA: Diagnosis not present

## 2021-09-18 ENCOUNTER — Other Ambulatory Visit: Payer: Self-pay | Admitting: Cardiology

## 2021-09-18 NOTE — Telephone Encounter (Signed)
Prescription refill request for Eliquis received. Indication: PAF Last office visit: 08/22/21  A Quinn FNP Scr: 1.34 on 09/17/21 Age: 87 Weight: 82.6kg  Based on above findings Eliquis 5mg  twice daily is the appropriate dose.  Refill approved.

## 2021-09-25 ENCOUNTER — Telehealth: Payer: Self-pay | Admitting: Family Medicine

## 2021-09-25 NOTE — Telephone Encounter (Signed)
New Message:      Patient would like her lab results from last week. She had them at [facility].

## 2021-09-25 NOTE — Telephone Encounter (Signed)
Patient informed. Copy sent to PCP °

## 2021-10-22 ENCOUNTER — Ambulatory Visit: Payer: Medicare Other | Admitting: Cardiology

## 2021-11-11 IMAGING — CT CT HEAD W/O CM
3 of 4 series · 14 of 47 positions shown, 16 images · non-contrast
Comparison: None.

CLINICAL DATA: Weakness

EXAM:
CT HEAD WITHOUT CONTRAST
TECHNIQUE: Contiguous axial images were obtained from the base of the skull
through the vertex without intravenous contrast.

[Series 4: coronal soft · coronal · 0.34mm/px · 3 of 67 slices shown]
[im 23/67  brain]
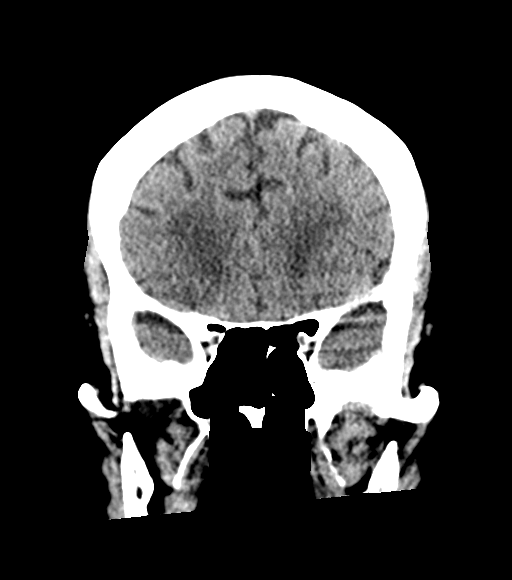
[im 30/67  brain]
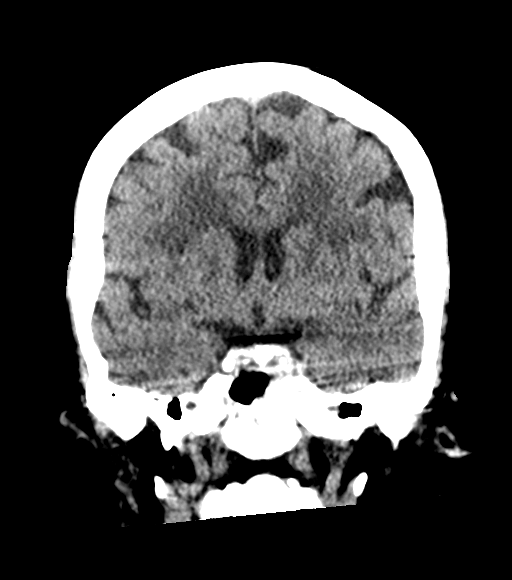
[im 37/67  brain]
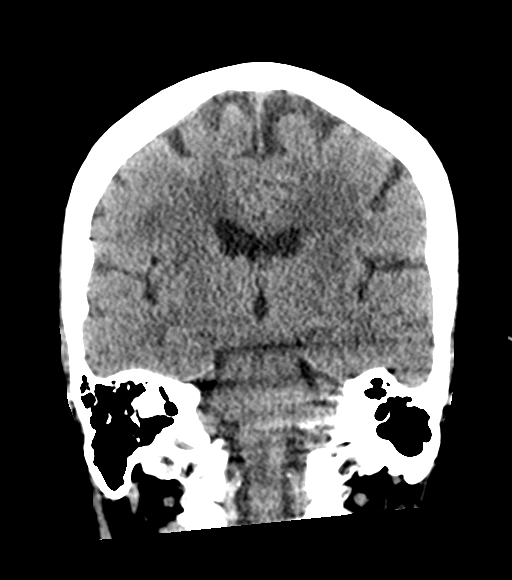

[Series 5: sagittal soft · sagittal · 0.37mm/px · 3 of 66 slices shown]
[im 27/66  brain]
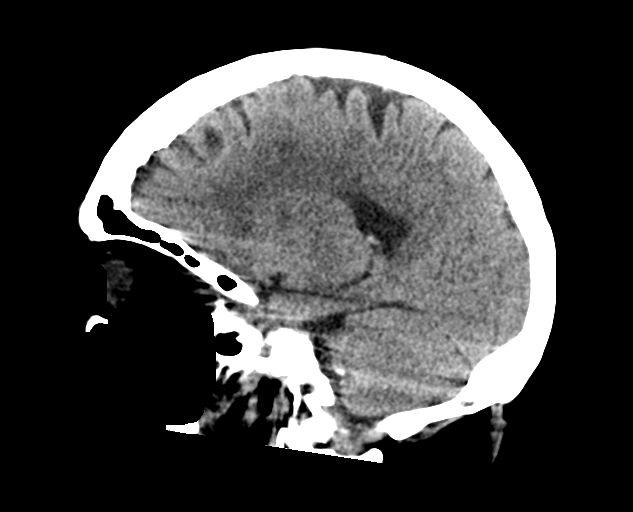
[im 34/66  brain]
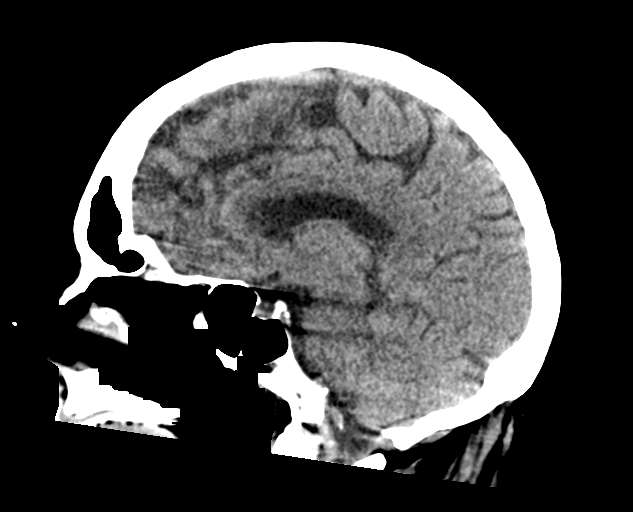
[im 40/66  brain]
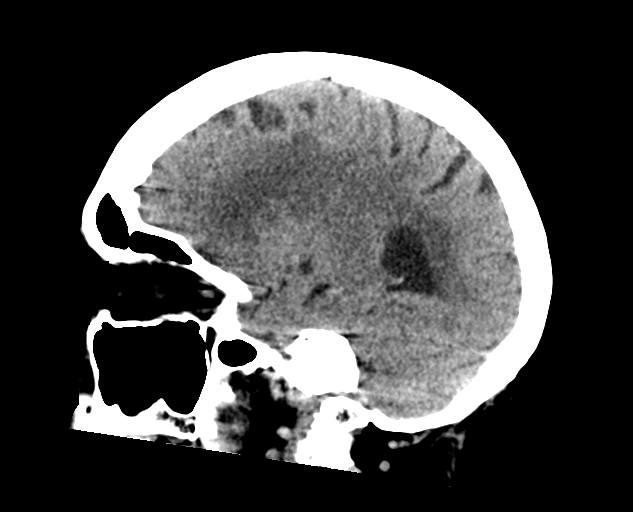

[Series 6: head ax w o · axial · 0.36mm/px · z∈[+40,+188]mm · 8 of 37 slices shown, 10 images]
[im 3/37  brain]
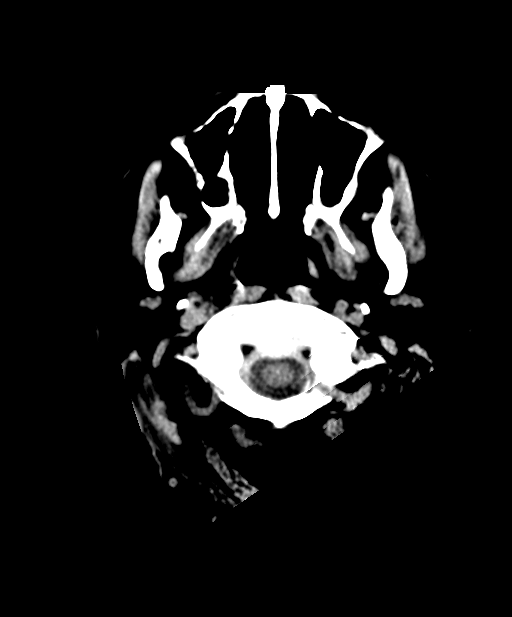
[im 3/37  bone]
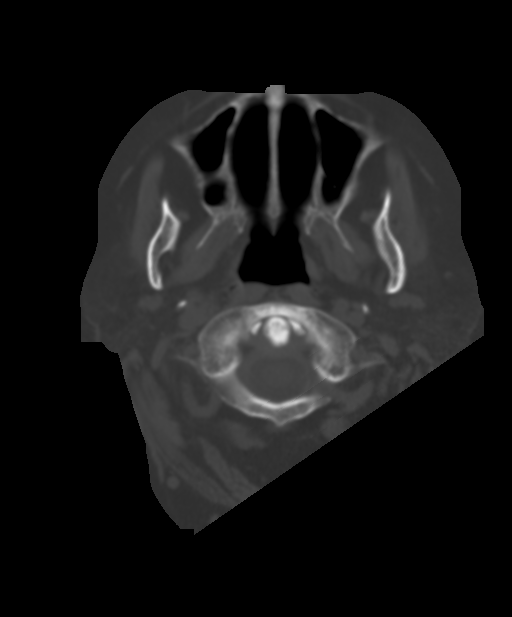
[im 8/37  brain]
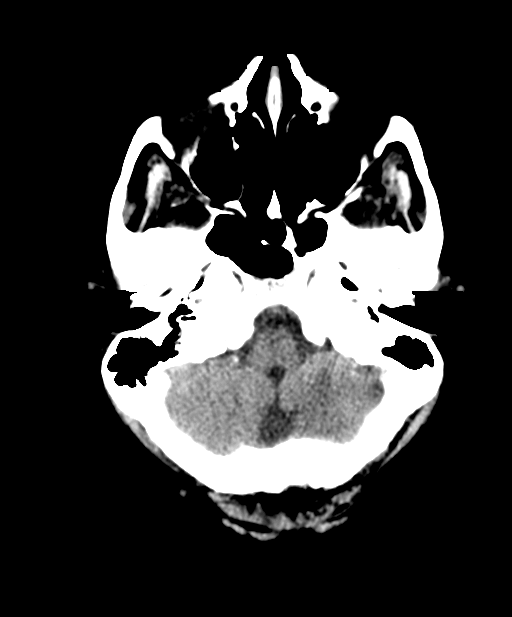
[im 13/37  brain]
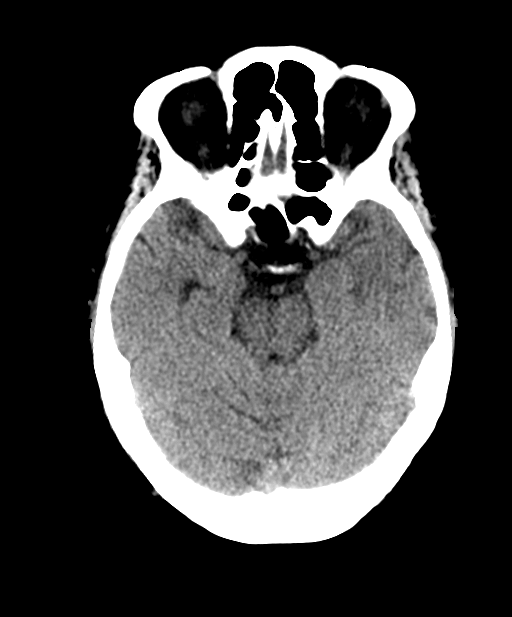
[im 17/37  brain]
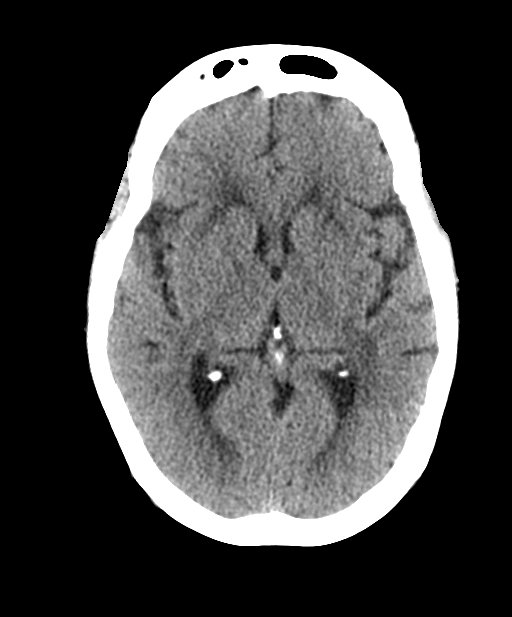
[im 20/37  brain]
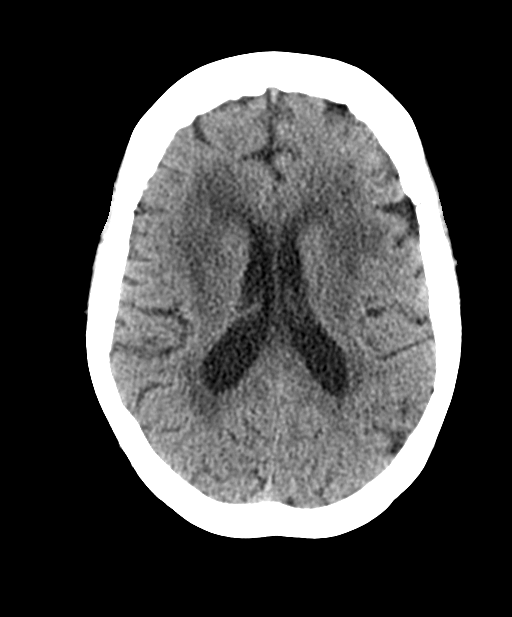
[im 20/37  bone]
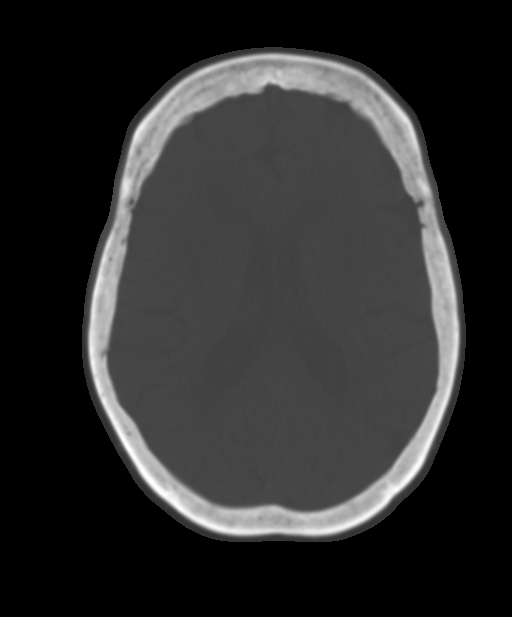
[im 25/37  brain]
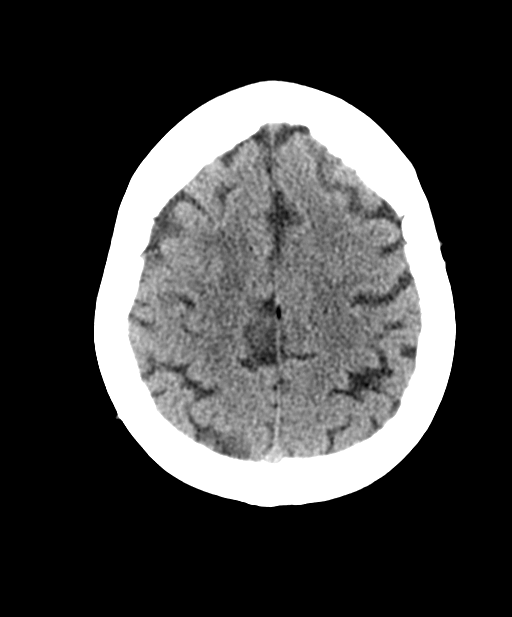
[im 29/37  brain]
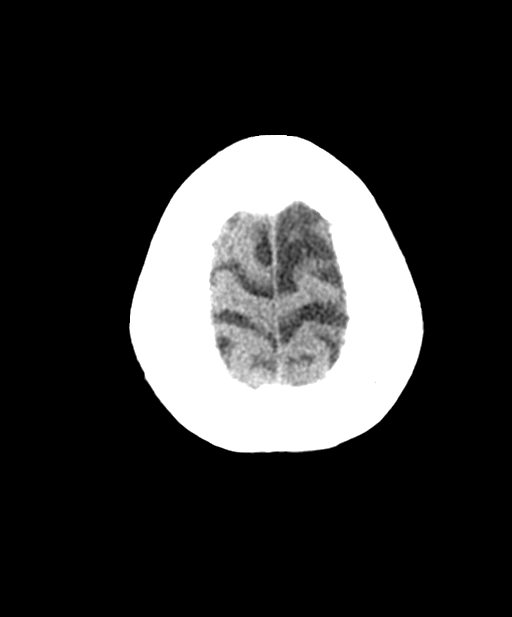
[im 34/37  brain]
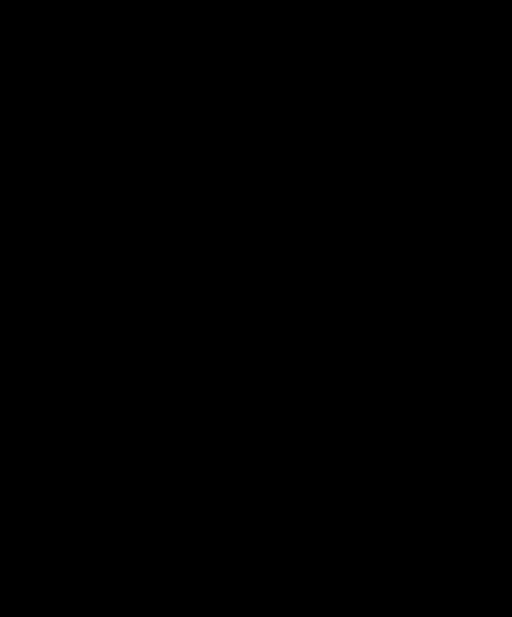

[14 of 47 positions shown; findings below may reference images not displayed]

FINDINGS: Brain: There is no acute intracranial hemorrhage, mass-effect, or
edema. Gray-white differentiation is preserved. There is no
extra-axial fluid collection. Ventricles and sulci are within normal
limits in size and configuration. Confluent areas of hypoattenuation
in the supratentorial white matter are nonspecific but probably
reflect moderate to advanced chronic microvascular ischemic changes.
Age-indeterminate small vessel infarct of the right lentiform
nucleus.

Vascular: There is atherosclerotic calcification at the skull base.

Skull: Calvarium is unremarkable.

Sinuses/Orbits: No acute finding.

Other: None.
IMPRESSION: No acute intracranial hemorrhage, mass effect, or evidence of acute
infarction.

Moderate to advanced chronic microvascular ischemic changes.

Age-indeterminate small vessel infarct of the right basal ganglia.

## 2021-11-14 ENCOUNTER — Telehealth: Payer: Self-pay | Admitting: Cardiology

## 2021-11-14 MED ORDER — LISINOPRIL 20 MG PO TABS: 20.0000 mg | ORAL_TABLET | Freq: Every day | ORAL | 1 refills | Status: DC

## 2021-11-14 NOTE — Telephone Encounter (Signed)
°*  STAT* If patient is at the pharmacy, call can be transferred to refill team.   1. Which medications need to be refilled? (please list name of each medication and dose if known) lisinopril (ZESTRIL) 20 MG tablet  2. Which pharmacy/location (including street and city if local pharmacy) is medication to be sent to? [id number] - [place], [place] - [address]  3. Do they need a 30 day or 90 day supply? 90 day  Patient has 1 tablet left.

## 2021-11-14 NOTE — Telephone Encounter (Signed)
done

## 2022-01-12 NOTE — Progress Notes (Unsigned)
Cardiology Office Note  Date: 01/12/2022   ID: Courtney Berry, DOB [birth year], MRN [id number]  PCP:  Loman Brooklyn, FNP  Cardiologist:  Rozann Lesches, MD Electrophysiologist:  None   No chief complaint on file.   History of Present Illness: Courtney Berry is an 88 y.o. female last seen in October 2022 by Mr. Leonides Sake NP.  Past Medical History:  Diagnosis Date   COPD (chronic obstructive pulmonary disease) ([place])    Diverticulosis    Essential hypertension    History of chest pain    Low risk Cardiolite 2010   Lymphedema    Mixed hyperlipidemia    PAF (paroxysmal atrial fibrillation) (HCC)    Rheumatoid arthritis ([place])     Past Surgical History:  Procedure Laterality Date   TOTAL ABDOMINAL HYSTERECTOMY  1969    Current Outpatient Medications  Medication Sig Dispense Refill   acetaminophen (TYLENOL) 500 MG tablet Take 1,000 mg by mouth every 6 (six) hours as needed for moderate pain.     amiodarone (PACERONE) 200 MG tablet Take 0.5 tablets (100 mg total) by mouth daily. 15 tablet 3   amLODipine (NORVASC) 5 MG tablet Take 1 tablet (5 mg total) by mouth daily. 90 tablet 3   apixaban (ELIQUIS) 5 MG TABS tablet TAKE 1 TABLET BY MOUTH TWICE A DAY 60 tablet 5   Bismuth Tribromoph-Petrolatum (XEROFORM PETROLAT GAUZE 5"X9") MISC APPLY 1 APPLICATION TOPICALLY DAILY. (BOTH LEGS) 30 each 2   DULoxetine (CYMBALTA) 30 MG capsule Take 30 mg by mouth daily.     fluticasone (FLONASE) 50 MCG/ACT nasal spray Place 2 sprays into both nostrils daily. 16 g 6   lisinopril (ZESTRIL) 20 MG tablet Take 1 tablet (20 mg total) by mouth daily. 90 tablet 1   metoprolol tartrate (LOPRESSOR) 25 MG tablet Take 0.5 tablets (12.5 mg total) by mouth 2 (two) times daily. 30 tablet 6   PROAIR HFA 108 (90 Base) MCG/ACT inhaler Inhale 1-2 puffs into the lungs as needed for wheezing or shortness of breath. 18 g 3   Probiotic Product (PROBIOTIC DAILY PO) Take 1 tablet by mouth daily.      umeclidinium-vilanterol (ANORO ELLIPTA) 62.5-25 MCG/INH AEPB Inhale 1 puff into the lungs daily. 60 each 3   No current facility-administered medications for this visit.   Allergies:  Darvocet [propoxyphene n-acetaminophen], Doxycycline, Nitrofurantoin monohyd macro, Plavix [clopidogrel bisulfate], Sulfa antibiotics, Amitriptyline, Gabapentin, Omnicef [cefdinir], and Penicillins   Social History: The patient  reports that she has never smoked. She has never used smokeless tobacco. She reports that she does not drink alcohol and does not use drugs.   Family History: The patient's family history includes Cancer in her brother and mother; Colon cancer in her father; Heart disease in her sister.   ROS:  Please see the history of present illness. Otherwise, complete review of systems is positive for {NONE DEFAULTED:18576}.  All other systems are reviewed and negative.   Physical Exam: VS:  There were no vitals taken for this visit., BMI There is no height or weight on file to calculate BMI.  Wt Readings from Last 3 Encounters:  08/22/21 182 lb (82.6 kg)  07/25/21 189 lb (85.7 kg)  07/17/21 181 lb 6.4 oz (82.3 kg)    General: Patient appears comfortable at rest. HEENT: Conjunctiva and lids normal, oropharynx clear with moist mucosa. Neck: Supple, no elevated JVP or carotid bruits, no thyromegaly. Lungs: Clear to auscultation, nonlabored breathing at rest. Cardiac: Regular rate and rhythm, no  S3 or significant systolic murmur, no pericardial rub. Abdomen: Soft, nontender, no hepatomegaly, bowel sounds present, no guarding or rebound. Extremities: No pitting edema, distal pulses 2+. Skin: Warm and dry. Musculoskeletal: No kyphosis. Neuropsychiatric: Alert and oriented x3, affect grossly appropriate.  ECG:  An ECG dated 07/18/2021 was personally reviewed today and demonstrated:  Sinus bradycardia.  Recent Labwork: 02/04/2021: ALT 17; AST 22; TSH 3.798 02/19/2021: BUN 13; Creatinine, Ser 1.11;  Hemoglobin 10.9; Platelets 171; Potassium 4.9; Sodium 143     Component Value Date/Time   CHOL 141 08/29/2020 0835   TRIG 118 08/29/2020 0835   HDL 50 08/29/2020 0835   CHOLHDL 2.8 08/29/2020 0835   CHOLHDL 3.7 08/02/2018 0417   VLDL 38 08/02/2018 0417   LDLCALC 70 08/29/2020 0835  November 2022: Potassium 4.0, BUN 20, creatinine 1.34, magnesium 1.6  Other Studies Reviewed Today:  Echocardiogram 08/02/2018: - Left ventricle: The cavity size was normal. Wall thickness was    increased increased in a pattern of mild to moderate LVH.    Systolic function was normal. The estimated ejection fraction was    in the range of 60% to 65%. Wall motion was normal; there were no    regional wall motion abnormalities. Doppler parameters are    consistent with abnormal left ventricular relaxation (grade 1    diastolic dysfunction).  - Aortic valve: Moderately calcified annulus. Trileaflet.  - Mitral valve: Mildly calcified annulus. There was trivial    regurgitation.  - Right atrium: Central venous pressure (est): 3 mm Hg.  - Atrial septum: No defect or patent foramen ovale was identified.  - Tricuspid valve: There was trivial regurgitation.  - Pulmonary arteries: Systolic pressure could not be accurately    estimated.  - Pericardium, extracardiac: There was no pericardial effusion.   Assessment and Plan:    Medication Adjustments/Labs and Tests Ordered: Current medicines are reviewed at length with the patient today.  Concerns regarding medicines are outlined above.   Tests Ordered: No orders of the defined types were placed in this encounter.   Medication Changes: No orders of the defined types were placed in this encounter.   Disposition:  Follow up {follow up:15908}  Signed, Satira Sark, MD, [facility] 01/12/2022 9:04 PM    [facility] at [place], [place], [place] [address] Phone: [phone]; Fax: [phone]

## 2022-01-13 ENCOUNTER — Ambulatory Visit: Payer: Medicare Other | Admitting: Cardiology

## 2022-01-13 DIAGNOSIS — I480 Paroxysmal atrial fibrillation: Secondary | ICD-10-CM

## 2022-02-14 ENCOUNTER — Encounter: Payer: Self-pay | Admitting: Cardiology

## 2022-02-14 ENCOUNTER — Ambulatory Visit: Payer: Medicare Other | Admitting: Cardiology

## 2022-02-14 VITALS — BP 144/70 | HR 56 | Wt 193.0 lb

## 2022-02-14 DIAGNOSIS — I10 Essential (primary) hypertension: Secondary | ICD-10-CM

## 2022-02-14 DIAGNOSIS — I4819 Other persistent atrial fibrillation: Secondary | ICD-10-CM

## 2022-02-14 DIAGNOSIS — R634 Abnormal weight loss: Secondary | ICD-10-CM

## 2022-02-14 NOTE — Progress Notes (Signed)
? ? ?Cardiology Office Note ? ?Date: 02/14/2022  ? ?ID: Courtney Berry, DOB Mar 30, 1933, MRN [id number] ? ?PCP:  Loman Brooklyn, FNP  ?Cardiologist:  Rozann Lesches, MD ?Electrophysiologist:  None  ? ?Chief Complaint  ?Patient presents with  ? Cardiac follow-up  ? ? ?History of Present Illness: ?Courtney Berry is an 88 y.o. female last seen in October 2022 by Mr. Leonides Sake NP.  She is here today with her husband for a follow-up visit.  She does not report any palpitations on current regimen.  Uses a walker or wheelchair due to chronic leg pain and weakness.  She does not report any recent falls or bleeding problems on Eliquis. ? ?She is due for follow-up lab work, we discussed getting this set up through the lab at [facility].  I personally reviewed her ECG today which shows sinus bradycardia with decreased R wave progression and nonspecific T wave changes. ? ?Past Medical History:  ?Diagnosis Date  ? COPD (chronic obstructive pulmonary disease) ([place])   ? Diverticulosis   ? Essential hypertension   ? History of chest pain   ? Low risk Cardiolite 2010  ? Lymphedema   ? Mixed hyperlipidemia   ? PAF (paroxysmal atrial fibrillation) ([place])   ? Rheumatoid arthritis ([place])   ? ? ?Past Surgical History:  ?Procedure Laterality Date  ? TOTAL ABDOMINAL HYSTERECTOMY  1969  ? ? ?Current Outpatient Medications  ?Medication Sig Dispense Refill  ? acetaminophen (TYLENOL) 500 MG tablet Take 1,000 mg by mouth every 6 (six) hours as needed for moderate pain.    ? amiodarone (PACERONE) 200 MG tablet Take 0.5 tablets (100 mg total) by mouth daily. 15 tablet 3  ? amLODipine (NORVASC) 5 MG tablet Take 1 tablet (5 mg total) by mouth daily. 90 tablet 3  ? apixaban (ELIQUIS) 5 MG TABS tablet TAKE 1 TABLET BY MOUTH TWICE A DAY 60 tablet 5  ? Bismuth Tribromoph-Petrolatum (XEROFORM PETROLAT GAUZE 5"X9") MISC APPLY 1 APPLICATION TOPICALLY DAILY. (BOTH LEGS) 30 each 2  ? fluticasone (FLONASE) 50 MCG/ACT nasal spray Place 2 sprays into both  nostrils daily. 16 g 6  ? lisinopril (ZESTRIL) 20 MG tablet Take 1 tablet (20 mg total) by mouth daily. 90 tablet 1  ? metoprolol tartrate (LOPRESSOR) 25 MG tablet Take 0.5 tablets (12.5 mg total) by mouth 2 (two) times daily. 30 tablet 6  ? PROAIR HFA 108 (90 Base) MCG/ACT inhaler Inhale 1-2 puffs into the lungs as needed for wheezing or shortness of breath. 18 g 3  ? Probiotic Product (PROBIOTIC DAILY PO) Take 1 tablet by mouth daily.    ? umeclidinium-vilanterol (ANORO ELLIPTA) 62.5-25 MCG/INH AEPB Inhale 1 puff into the lungs daily. 60 each 3  ? DULoxetine (CYMBALTA) 30 MG capsule Take 30 mg by mouth daily. (Patient not taking: Reported on 02/14/2022)    ? ?No current facility-administered medications for this visit.  ? ?Allergies:  Darvocet [propoxyphene n-acetaminophen], Doxycycline, Nitrofurantoin monohyd macro, Plavix [clopidogrel bisulfate], Sulfa antibiotics, Amitriptyline, Gabapentin, Omnicef [cefdinir], and Penicillins  ? ?ROS: No chest pain or syncope. ? ?Physical Exam: ?VS:  BP (!) 144/70   Pulse (!) 56   Wt 193 lb (87.5 kg)   SpO2 97%   BMI 31.15 kg/m? , BMI Body mass index is 31.15 kg/m?. ? ?Wt Readings from Last 3 Encounters:  ?02/14/22 193 lb (87.5 kg)  ?08/22/21 182 lb (82.6 kg)  ?07/25/21 189 lb (85.7 kg)  ?  ?General: Patient appears comfortable at rest. ?HEENT: Conjunctiva  and lids normal, wearing a mask. ?Neck: Supple, no elevated JVP or carotid bruits, no thyromegaly. ?Lungs: Clear to auscultation, nonlabored breathing at rest. ?Cardiac: Regular rate and rhythm, no S3 or significant systolic murmur, no pericardial rub. ?Extremities: Chronic appearing lymphedema, stable. ? ?ECG:  An ECG dated 07/18/2019 was personally reviewed today and demonstrated:  Sinus bradycardia. ? ?Recent Labwork: ?02/19/2021: BUN 13; Creatinine, Ser 1.11; Hemoglobin 10.9; Platelets 171; Potassium 4.9; Sodium 143  ?   ?Component Value Date/Time  ? CHOL 141 08/29/2020 0835  ? TRIG 118 08/29/2020 0835  ? HDL 50  08/29/2020 0835  ? CHOLHDL 2.8 08/29/2020 0835  ? CHOLHDL 3.7 08/02/2018 0417  ? VLDL 38 08/02/2018 0417  ? [place] 70 08/29/2020 0835  ? ? ?Other Studies Reviewed Today: ? ?Echocardiogram 08/02/2018: ?- Left ventricle: The cavity size was normal. Wall thickness was  ?  increased increased in a pattern of mild to moderate LVH.  ?  Systolic function was normal. The estimated ejection fraction was  ?  in the range of 60% to 65%. Wall motion was normal; there were no  ?  regional wall motion abnormalities. Doppler parameters are  ?  consistent with abnormal left ventricular relaxation (grade 1  ?  diastolic dysfunction).  ?- Aortic valve: Moderately calcified annulus. Trileaflet.  ?- Mitral valve: Mildly calcified annulus. There was trivial  ?  regurgitation.  ?- Right atrium: Central venous pressure (est): 3 mm Hg.  ?- Atrial septum: No defect or patent foramen ovale was identified.  ?- Tricuspid valve: There was trivial regurgitation.  ?- Pulmonary arteries: Systolic pressure could not be accurately  ?  estimated.  ?- Pericardium, extracardiac: There was no pericardial effusion.  ? ?Lexiscan Myoview 08/30/2018: ?There was no ST segment deviation noted during stress. ?The study is normal. No ischemia or scar. ?This is a low risk study. ?Nuclear stress EF: 77%. ? ?Assessment and Plan: ? ?1.  Persistent atrial fibrillation with CHA2DS2-VASc score of 4.  She is in sinus rhythm by ECG today and continues on Lopressor, amiodarone, and Eliquis.  Check CBC, CMET, and TSH.  ECG reviewed. ? ?2.  Essential hypertension, no changes made to current regimen today.  She is also on lisinopril and Norvasc. ? ?Medication Adjustments/Labs and Tests Ordered: ?Current medicines are reviewed at length with the patient today.  Concerns regarding medicines are outlined above.  ? ?Tests Ordered: ?Orders Placed This Encounter  ?Procedures  ? Comprehensive metabolic panel  ? CBC  ? TSH  ? EKG 12-Lead  ? ? ?Medication Changes: ?No orders of the  defined types were placed in this encounter. ? ? ?Disposition:  Follow up  6 months. ? ?Signed, ?Satira Sark, MD, [facility] ?02/14/2022 4:08 PM    ?[place] at [facility] ?[place], [place],  [address] ?Phone: [phone]; Fax: [phone]  ?

## 2022-02-14 NOTE — Patient Instructions (Signed)
Medication Instructions:  ?Continue all current medications. ? ?Labwork: ?CMET, TSH, CBC - orders given today ?Office will contact with results via phone or letter.    ? ?Testing/Procedures: ?none ? ?Follow-Up: ?6 months  ? ?Any Other Special Instructions Will Be Listed Below (If Applicable). ? ? ?If you need a refill on your cardiac medications before your next appointment, please call your pharmacy.  ?

## 2022-02-19 DIAGNOSIS — I4819 Other persistent atrial fibrillation: Secondary | ICD-10-CM | POA: Diagnosis not present

## 2022-03-11 ENCOUNTER — Ambulatory Visit (INDEPENDENT_AMBULATORY_CARE_PROVIDER_SITE_OTHER): Payer: Medicare Other

## 2022-03-11 VITALS — Wt 190.0 lb

## 2022-03-11 DIAGNOSIS — Z0000 Encounter for general adult medical examination without abnormal findings: Secondary | ICD-10-CM | POA: Diagnosis not present

## 2022-03-11 NOTE — Progress Notes (Signed)
? ?Subjective:  ? Courtney Berry is a 88 y.o. female who presents for Medicare Annual (Subsequent) preventive examination. ? ?Virtual Visit via Telephone Note ? ?I connected with  Courtney Berry on 03/11/22 at 11:15 AM EDT by telephone and verified that I am speaking with the correct person using two identifiers. ? ?Location: ?Patient: Home ?Provider: [facility] ?Persons participating in the virtual visit: patient/Nurse Health Advisor ?  ?I discussed the limitations, risks, security and privacy concerns of performing an evaluation and management service by telephone and the availability of in person appointments. The patient expressed understanding and agreed to proceed. ? ?Interactive audio and video telecommunications were attempted between this nurse and patient, however failed, due to patient having technical difficulties OR patient did not have access to video capability.  We continued and completed visit with audio only. ? ?Some vital signs may be absent or patient reported.  ? ?Anik Wesch Hurman Horn, LPN  ? ?Review of Systems    ? ?Cardiac Risk Factors include: advanced age (>2men, >29 women);obesity (BMI >30kg/m2);sedentary lifestyle;dyslipidemia;Other (see comment), Risk factor comments: PAD, A.FIB, COPD ? ?   ?Objective:  ?  ?Today's Vitals  ? 03/11/22 1118  ?Weight: 190 lb (86.2 kg)  ? ?Body mass index is 30.67 kg/m?. ? ? ?  03/11/2022  ? 11:42 AM 03/07/2021  ? 10:14 AM 03/17/2020  ?  3:13 PM 10/26/2019  ? 12:45 PM 10/18/2019  ? 10:54 AM 08/01/2018  ?  2:50 PM 08/01/2018  ?  9:54 AM  ?Advanced Directives  ?Does Patient Have a Medical Advance Directive? Yes Yes No Yes No No Yes  ?Type of Advance Directive Living will;Healthcare Power of [employer] Power of [employer] Power of [employer] Power of Attorney  ?Does patient want to make changes to medical advance directive?      No - Patient declined No - Patient declined  ?Copy of Healthcare Power of Attorney in Chart? No - copy requested Yes -  validated most recent copy scanned in chart (See row information)  No - copy requested   No - copy requested  ?Would patient like information on creating a medical advance directive?   No - Patient declined   No - Patient declined   ? ? ?Current Medications (verified) ?Outpatient Encounter Medications as of 03/11/2022  ?Medication Sig  ? acetaminophen (TYLENOL) 500 MG tablet Take 1,000 mg by mouth every 6 (six) hours as needed for moderate pain.  ? amiodarone (PACERONE) 200 MG tablet Take 0.5 tablets (100 mg total) by mouth daily.  ? apixaban (ELIQUIS) 5 MG TABS tablet TAKE 1 TABLET BY MOUTH TWICE A DAY  ? lisinopril (ZESTRIL) 20 MG tablet Take 1 tablet (20 mg total) by mouth daily.  ? metoprolol tartrate (LOPRESSOR) 25 MG tablet Take 0.5 tablets (12.5 mg total) by mouth 2 (two) times daily.  ? PROAIR HFA 108 (90 Base) MCG/ACT inhaler Inhale 1-2 puffs into the lungs as needed for wheezing or shortness of breath.  ? Probiotic Product (PROBIOTIC DAILY PO) Take 1 tablet by mouth daily.  ? umeclidinium-vilanterol (ANORO ELLIPTA) 62.5-25 MCG/INH AEPB Inhale 1 puff into the lungs daily.  ? amLODipine (NORVASC) 5 MG tablet Take 1 tablet (5 mg total) by mouth daily.  ? [DISCONTINUED] Bismuth Tribromoph-Petrolatum (XEROFORM PETROLAT GAUZE 5"X9") MISC APPLY 1 APPLICATION TOPICALLY DAILY. (BOTH LEGS) (Patient not taking: Reported on 03/11/2022)  ? [DISCONTINUED] DULoxetine (CYMBALTA) 30 MG capsule Take 30 mg by mouth daily. (Patient not taking: Reported on  02/14/2022)  ? [DISCONTINUED] fluticasone (FLONASE) 50 MCG/ACT nasal spray Place 2 sprays into both nostrils daily. (Patient not taking: Reported on 03/11/2022)  ? ?No facility-administered encounter medications on file as of 03/11/2022.  ? ? ?Allergies (verified) ?Darvocet [propoxyphene n-acetaminophen], Doxycycline, Nitrofurantoin monohyd macro, Plavix [clopidogrel bisulfate], Sulfa antibiotics, Amitriptyline, Gabapentin, Omnicef [cefdinir], and Penicillins  ? ?History: ?Past  Medical History:  ?Diagnosis Date  ? COPD (chronic obstructive pulmonary disease) (HCC)   ? Diverticulosis   ? Essential hypertension   ? History of chest pain   ? Low risk Cardiolite 2010  ? Lymphedema   ? Mixed hyperlipidemia   ? PAF (paroxysmal atrial fibrillation) (HCC)   ? Rheumatoid arthritis (HCC)   ? ?Past Surgical History:  ?Procedure Laterality Date  ? TOTAL ABDOMINAL HYSTERECTOMY  11/18/1967  ? ?Family History  ?Problem Relation Age of Onset  ? Colon cancer Father   ? Cancer Mother   ? Heart disease Sister   ?     CABG age 68  ? Cancer Brother   ? ?Social History  ? ?Socioeconomic History  ? Marital status: Married  ?  Spouse name: Not on file  ? Number of children: 2  ? Years of education: Not on file  ? Highest education level: Not on file  ?Occupational History  ? Occupation: Retired  ?Tobacco Use  ? Smoking status: Never  ? Smokeless tobacco: Never  ? Tobacco comments:  ?  Tobacco use-no  ?Vaping Use  ? Vaping Use: Never used  ?Substance and Sexual Activity  ? Alcohol use: No  ?  Alcohol/week: 0.0 standard drinks  ? Drug use: No  ? Sexual activity: Not on file  ?Other Topics Concern  ? Not on file  ?Social History Narrative  ? Lives with her husband - one level living, handicap accessible home - Son and daughter both live very close by.  ? ?Social Determinants of Health  ? ?Financial Resource Strain: Low Risk   ? Difficulty of Paying Living Expenses: Not hard at all  ?Food Insecurity: No Food Insecurity  ? Worried About Programme researcher, broadcasting/film/video in the Last Year: Never true  ? Ran Out of Food in the Last Year: Never true  ?Transportation Needs: No Transportation Needs  ? Lack of Transportation (Medical): No  ? Lack of Transportation (Non-Medical): No  ?Physical Activity: Insufficiently Active  ? Days of Exercise per Week: 7 days  ? Minutes of Exercise per Session: 10 min  ?Stress: No Stress Concern Present  ? Feeling of Stress : Not at all  ?Social Connections: Socially Integrated  ? Frequency of  Communication with Friends and Family: More than three times a week  ? Frequency of Social Gatherings with Friends and Family: More than three times a week  ? Attends Religious Services: More than 4 times per year  ? Active Member of Clubs or Organizations: Yes  ? Attends Banker Meetings: More than 4 times per year  ? Marital Status: Married  ? ? ?Tobacco Counseling ?Counseling given: Not Answered ?Tobacco comments: Tobacco use-no ? ? ?Clinical Intake: ? ?Pre-visit preparation completed: Yes ? ?Pain : No/denies pain ? ?  ? ?BMI - recorded: 30.67 ?Nutritional Status: BMI > 30  Obese ?Nutritional Risks: None ?Diabetes: No ? ?How often do you need to have someone help you when you read instructions, pamphlets, or other written materials from your doctor or pharmacy?: 1 - Never ? ?Diabetic? no ? ?Interpreter Needed?: No ? ?Information entered by ::  Romi Rathel, LPN ? ? ?Activities of Daily Living ? ?  03/11/2022  ? 11:41 AM  ?In your present state of health, do you have any difficulty performing the following activities:  ?Hearing? 0  ?Vision? 0  ?Difficulty concentrating or making decisions? 0  ?Walking or climbing stairs? 0  ?Dressing or bathing? 0  ?Doing errands, shopping? 1  ?Comment She doesn't drive - husband drives her  ?Preparing Food and eating ? N  ?Using the Toilet? N  ?In the past six months, have you accidently leaked urine? N  ?Do you have problems with loss of bowel control? N  ?Managing your Medications? Y  ?Comment husband manages  ?Managing your Finances? Y  ?Comment duaghter manages  ?Housekeeping or managing your Housekeeping? N  ? ? ?Patient Care Team: ?Gwenlyn FudgeJoyce, Britney F, FNP as PCP - General (Family Medicine) ?Jonelle SidleMcDowell, Samuel G, MD as PCP - Cardiology (Cardiology) ?Kari BaarsHawkins, Edward, MD as Consulting Physician (Pulmonary Disease) ?Rourk, Gerrit Friendsobert M, MD as Consulting Physician (Gastroenterology) ? ?Indicate any recent Medical Services you may have received from other than [facility] providers in  the past year (date may be approximate). ? ?   ?Assessment:  ? This is a routine wellness examination for [place]. ? ?Hearing/Vision screen ?Hearing Screening - Comments:: Denies hearing difficulties   ?Vision S

## 2022-03-11 NOTE — Patient Instructions (Signed)
Ms. Derickson , ?Thank you for taking time to come for your Medicare Wellness Visit. I appreciate your ongoing commitment to your health goals. Please review the following plan we discussed and let me know if I can assist you in the future.  ? ?Screening recommendations/referrals: ?Colonoscopy: no longer required ?Mammogram: no longer required ?Bone Density: no longer required ?Recommended yearly ophthalmology/optometry visit for glaucoma screening and checkup ?Recommended yearly dental visit for hygiene and checkup ? ?Vaccinations: ?Influenza vaccine: RECOMMENDED EVERY FALL ?Pneumococcal vaccine: RECOMMEND ONCE PER LIFETIME PREVNAR-20 ?Tdap vaccine: RECOMMENDED EVERY 10 YEARS ?Shingles vaccine: RECOMMEND. Shingrix is 2 doses 2-6 months apart and over 90% effective     ?Covid-19:RECOMMEND 2 DOSES ONE MONTH APART AND BOOSTER 6 MONTHS LATER ? ?Advanced directives: Please bring a copy of your health care power of attorney and living will to the office to be added to your chart at your convenience.  ? ?Conditions/risks identified: Aim for 30 minutes of exercise or brisk walking, 6-8 glasses of water, and 5 servings of fruits and vegetables each day.  ? ?Next appointment: Follow up in one year for your annual wellness visit  ? ? ?Preventive Care 36 Years and Older, Female ?Preventive care refers to lifestyle choices and visits with your health care provider that can promote health and wellness. ?What does preventive care include? ?A yearly physical exam. This is also called an annual well check. ?Dental exams once or twice a year. ?Routine eye exams. Ask your health care provider how often you should have your eyes checked. ?Personal lifestyle choices, including: ?Daily care of your teeth and gums. ?Regular physical activity. ?Eating a healthy diet. ?Avoiding tobacco and drug use. ?Limiting alcohol use. ?Practicing safe sex. ?Taking low-dose aspirin every day. ?Taking vitamin and mineral supplements as recommended by your  health care provider. ?What happens during an annual well check? ?The services and screenings done by your health care provider during your annual well check will depend on your age, overall health, lifestyle risk factors, and family history of disease. ?Counseling  ?Your health care provider may ask you questions about your: ?Alcohol use. ?Tobacco use. ?Drug use. ?Emotional well-being. ?Home and relationship well-being. ?Sexual activity. ?Eating habits. ?History of falls. ?Memory and ability to understand (cognition). ?Work and work Statistician. ?Reproductive health. ?Screening  ?You may have the following tests or measurements: ?Height, weight, and BMI. ?Blood pressure. ?Lipid and cholesterol levels. These may be checked every 5 years, or more frequently if you are over 23 years old. ?Skin check. ?Lung cancer screening. You may have this screening every year starting at age 66 if you have a 30-pack-year history of smoking and currently smoke or have quit within the past 15 years. ?Fecal occult blood test (FOBT) of the stool. You may have this test every year starting at age 62. ?Flexible sigmoidoscopy or colonoscopy. You may have a sigmoidoscopy every 5 years or a colonoscopy every 10 years starting at age 79. ?Hepatitis C blood test. ?Hepatitis B blood test. ?Sexually transmitted disease (STD) testing. ?Diabetes screening. This is done by checking your blood sugar (glucose) after you have not eaten for a while (fasting). You may have this done every 1-3 years. ?Bone density scan. This is done to screen for osteoporosis. You may have this done starting at age 37. ?Mammogram. This may be done every 1-2 years. Talk to your health care provider about how often you should have regular mammograms. ?Talk with your health care provider about your test results, treatment options, and if  necessary, the need for more tests. ?Vaccines  ?Your health care provider may recommend certain vaccines, such as: ?Influenza vaccine.  This is recommended every year. ?Tetanus, diphtheria, and acellular pertussis (Tdap, Td) vaccine. You may need a Td booster every 10 years. ?Zoster vaccine. You may need this after age 45. ?Pneumococcal 13-valent conjugate (PCV13) vaccine. One dose is recommended after age 78. ?Pneumococcal polysaccharide (PPSV23) vaccine. One dose is recommended after age 20. ?Talk to your health care provider about which screenings and vaccines you need and how often you need them. ?This information is not intended to replace advice given to you by your health care provider. Make sure you discuss any questions you have with your health care provider. ?Document Released: 11/30/2015 Document Revised: 07/23/2016 Document Reviewed: 09/04/2015 ?Elsevier Interactive Patient Education ? 2017 [employer]. ? ?Fall Prevention in the Home ?Falls can cause injuries. They can happen to people of all ages. There are many things you can do to make your home safe and to help prevent falls. ?What can I do on the outside of my home? ?Regularly fix the edges of walkways and driveways and fix any cracks. ?Remove anything that might make you trip as you walk through a door, such as a raised step or threshold. ?Trim any bushes or trees on the path to your home. ?Use bright outdoor lighting. ?Clear any walking paths of anything that might make someone trip, such as rocks or tools. ?Regularly check to see if handrails are loose or broken. Make sure that both sides of any steps have handrails. ?Any raised decks and porches should have guardrails on the edges. ?Have any leaves, snow, or ice cleared regularly. ?Use sand or salt on walking paths during winter. ?Clean up any spills in your garage right away. This includes oil or grease spills. ?What can I do in the bathroom? ?Use night lights. ?Install grab bars by the toilet and in the tub and shower. Do not use towel bars as grab bars. ?Use non-skid mats or decals in the tub or shower. ?If you need to sit  down in the shower, use a plastic, non-slip stool. ?Keep the floor dry. Clean up any water that spills on the floor as soon as it happens. ?Remove soap buildup in the tub or shower regularly. ?Attach bath mats securely with double-sided non-slip rug tape. ?Do not have throw rugs and other things on the floor that can make you trip. ?What can I do in the bedroom? ?Use night lights. ?Make sure that you have a light by your bed that is easy to reach. ?Do not use any sheets or blankets that are too big for your bed. They should not hang down onto the floor. ?Have a firm chair that has side arms. You can use this for support while you get dressed. ?Do not have throw rugs and other things on the floor that can make you trip. ?What can I do in the kitchen? ?Clean up any spills right away. ?Avoid walking on wet floors. ?Keep items that you use a lot in easy-to-reach places. ?If you need to reach something above you, use a strong step stool that has a grab bar. ?Keep electrical cords out of the way. ?Do not use floor polish or wax that makes floors slippery. If you must use wax, use non-skid floor wax. ?Do not have throw rugs and other things on the floor that can make you trip. ?What can I do with my stairs? ?Do not leave any items  on the stairs. ?Make sure that there are handrails on both sides of the stairs and use them. Fix handrails that are broken or loose. Make sure that handrails are as long as the stairways. ?Check any carpeting to make sure that it is firmly attached to the stairs. Fix any carpet that is loose or worn. ?Avoid having throw rugs at the top or bottom of the stairs. If you do have throw rugs, attach them to the floor with carpet tape. ?Make sure that you have a light switch at the top of the stairs and the bottom of the stairs. If you do not have them, ask someone to add them for you. ?What else can I do to help prevent falls? ?Wear shoes that: ?Do not have high heels. ?Have rubber bottoms. ?Are  comfortable and fit you well. ?Are closed at the toe. Do not wear sandals. ?If you use a stepladder: ?Make sure that it is fully opened. Do not climb a closed stepladder. ?Make sure that both sides of the step

## 2022-03-12 ENCOUNTER — Telehealth: Payer: Self-pay | Admitting: *Deleted

## 2022-03-12 MED ORDER — APIXABAN 2.5 MG PO TABS: 2.5000 mg | ORAL_TABLET | Freq: Two times a day (BID) | ORAL | 6 refills | Status: DC

## 2022-03-12 NOTE — Telephone Encounter (Signed)
Patient's daughter [name] informed. Copy sent to PCP ?

## 2022-03-12 NOTE — Telephone Encounter (Signed)
-----   Message from Jonelle Sidle, MD sent at 03/11/2022  7:10 PM EDT ----- ?Results reviewed.  Potassium normal, LFTs normal, hemoglobin normal, and TSH normal.  Renal function has changed since last assessment, creatinine is 1.63.  This means we should reduce Eliquis to 2.5 mg twice daily. ?

## 2022-03-20 ENCOUNTER — Ambulatory Visit: Payer: Self-pay | Admitting: Cardiology

## 2022-04-24 ENCOUNTER — Other Ambulatory Visit: Payer: Self-pay | Admitting: Cardiology

## 2022-05-06 ENCOUNTER — Ambulatory Visit: Payer: Medicare Other | Admitting: Family Medicine

## 2022-05-13 ENCOUNTER — Ambulatory Visit: Payer: Medicare Other | Admitting: Family Medicine

## 2022-05-22 ENCOUNTER — Telehealth: Payer: Self-pay | Admitting: Cardiology

## 2022-05-22 MED ORDER — METOPROLOL TARTRATE 25 MG PO TABS: 12.5000 mg | ORAL_TABLET | Freq: Two times a day (BID) | ORAL | 6 refills | Status: DC

## 2022-05-22 NOTE — Telephone Encounter (Signed)
Informed that lopressor refill sent to [facility]

## 2022-05-22 NOTE — Telephone Encounter (Signed)
*  STAT* If patient is at the pharmacy, call can be transferred to refill team.   1. Which medications need to be refilled? (please list name of each medication and dose if known)   metoprolol tartrate (LOPRESSOR) 25 MG tablet    2. Which pharmacy/location (including street and city if local pharmacy) is medication to be sent to? [id number] [place], [place] - [address]  3. Do they need a 30 day or 90 day supply?  30 day   Pt is completely out of medication. Please advise

## 2022-05-27 ENCOUNTER — Ambulatory Visit (INDEPENDENT_AMBULATORY_CARE_PROVIDER_SITE_OTHER): Payer: Medicare Other | Admitting: Family Medicine

## 2022-05-27 ENCOUNTER — Encounter: Payer: Self-pay | Admitting: Family Medicine

## 2022-05-27 VITALS — BP 122/48 | HR 56 | Temp 97.8°F

## 2022-05-27 DIAGNOSIS — I480 Paroxysmal atrial fibrillation: Secondary | ICD-10-CM

## 2022-05-27 DIAGNOSIS — I739 Peripheral vascular disease, unspecified: Secondary | ICD-10-CM | POA: Diagnosis not present

## 2022-05-27 DIAGNOSIS — J449 Chronic obstructive pulmonary disease, unspecified: Secondary | ICD-10-CM | POA: Diagnosis not present

## 2022-05-27 DIAGNOSIS — I10 Essential (primary) hypertension: Secondary | ICD-10-CM

## 2022-05-27 NOTE — Progress Notes (Signed)
Assessment & Plan:  1. Essential hypertension Well controlled on current regimen. Managed by cardiology. - CBC with Differential/Platelet - CMP14+EGFR - TSH  2. PAF (paroxysmal atrial fibrillation) (HCC) Well controlled on current regimen. Managed by cardiology. - CBC with Differential/Platelet - CMP14+EGFR  3. Peripheral vascular disease ([place]) Managed by cardiology.  4. Chronic obstructive pulmonary disease, unspecified COPD type ([place]) Controlled.   Return in about 3 months (around 08/27/2022) for annual physical with Je.  Hendricks Limes, MSN, APRN, FNP-C [facility]  Subjective:    Patient ID: Courtney Berry, female    DOB: [birth year], 88 y.o.   MRN: [id number]  Patient Care Team: Loman Brooklyn, FNP as PCP - General (Family Medicine) Satira Sark, MD as PCP - Cardiology (Cardiology) Sinda Du, MD as Consulting Physician (Pulmonary Disease) Daneil Dolin, MD as Consulting Physician (Gastroenterology)   Chief Complaint:  Chief Complaint  Patient presents with   Medical Management of Chronic Issues    HPI: Courtney Berry is a 88 y.o. female presenting on 05/27/2022 for Medical Management of Chronic Issues  Patient is accompanied by her daughter who she is okay with being present.  A-Fib: managed by cardiology.   Hypertension: managed by cardiology.  COPD: Patient reports she has not needed her Anoro or Proair in quite some time.  New complaints: None   Social history:  Relevant past medical, surgical, family and social history reviewed and updated as indicated. Interim medical history since our last visit reviewed.  Allergies and medications reviewed and updated.  DATA REVIEWED: CHART IN EPIC  ROS: Negative unless specifically indicated above in HPI.    Current Outpatient Medications:    acetaminophen (TYLENOL) 500 MG tablet, Take 1,000 mg by mouth every 6 (six) hours as needed for moderate pain., Disp: ,  Rfl:    amiodarone (PACERONE) 200 MG tablet, Take 0.5 tablets (100 mg total) by mouth daily., Disp: 15 tablet, Rfl: 3   apixaban (ELIQUIS) 2.5 MG TABS tablet, Take 1 tablet (2.5 mg total) by mouth 2 (two) times daily., Disp: 60 tablet, Rfl: 6   lisinopril (ZESTRIL) 20 MG tablet, TAKE 1 TABLET BY MOUTH EVERY DAY, Disp: 90 tablet, Rfl: 1   metoprolol tartrate (LOPRESSOR) 25 MG tablet, Take 0.5 tablets (12.5 mg total) by mouth 2 (two) times daily., Disp: 30 tablet, Rfl: 6   PROAIR HFA 108 (90 Base) MCG/ACT inhaler, Inhale 1-2 puffs into the lungs as needed for wheezing or shortness of breath., Disp: 18 g, Rfl: 3   Probiotic Product (PROBIOTIC DAILY PO), Take 1 tablet by mouth daily., Disp: , Rfl:    umeclidinium-vilanterol (ANORO ELLIPTA) 62.5-25 MCG/INH AEPB, Inhale 1 puff into the lungs daily., Disp: 60 each, Rfl: 3   amLODipine (NORVASC) 5 MG tablet, Take 1 tablet (5 mg total) by mouth daily., Disp: 90 tablet, Rfl: 3   Allergies  Allergen Reactions   Darvocet [Propoxyphene N-Acetaminophen] Other (See Comments)    unknown   Doxycycline Itching   Nitrofurantoin Monohyd Macro Other (See Comments)    unknown   Plavix [Clopidogrel Bisulfate] Swelling    Face swells   Sulfa Antibiotics Other (See Comments)    "face got puffy"   Amitriptyline Other (See Comments)    Made her "talk out of her head" and sleep until lunch the next day.   Gabapentin Other (See Comments)    Made her "talk out of her head".   Omnicef [Cefdinir] Nausea And Vomiting and Rash   Penicillins  Rash    Has patient had a PCN reaction causing immediate rash, facial/tongue/throat swelling, SOB or lightheadedness with hypotension: Yes Has patient had a PCN reaction causing severe rash involving mucus membranes or skin necrosis: No Has patient had a PCN reaction that required hospitalization: No Has patient had a PCN reaction occurring within the last 10 years: No If all of the above answers are "NO", then may proceed with  Cephalosporin use.    Past Medical History:  Diagnosis Date   COPD (chronic obstructive pulmonary disease) ([place])    Diverticulosis    Essential hypertension    History of chest pain    Low risk Cardiolite 2010   Lymphedema    Mixed hyperlipidemia    PAF (paroxysmal atrial fibrillation) (HCC)    Rheumatoid arthritis (HCC)     Past Surgical History:  Procedure Laterality Date   TOTAL ABDOMINAL HYSTERECTOMY  11/18/1967    Social History   Socioeconomic History   Marital status: Married    Spouse name: Not on file   Number of children: 2   Years of education: Not on file   Highest education level: Not on file  Occupational History   Occupation: Retired  Tobacco Use   Smoking status: Never   Smokeless tobacco: Never   Tobacco comments:    Tobacco use-no  Vaping Use   Vaping Use: Never used  Substance and Sexual Activity   Alcohol use: No    Alcohol/week: 0.0 standard drinks of alcohol   Drug use: No   Sexual activity: Not on file  Other Topics Concern   Not on file  Social History Narrative   Lives with her husband - one level living, handicap accessible home - Son and daughter both live very close by.   Social Determinants of Health   Financial Resource Strain: Low Risk  (03/11/2022)   Overall Financial Resource Strain (CARDIA)    Difficulty of Paying Living Expenses: Not hard at all  Food Insecurity: No Food Insecurity (03/11/2022)   Hunger Vital Sign    Worried About Running Out of Food in the Last Year: Never true    Ran Out of Food in the Last Year: Never true  Transportation Needs: No Transportation Needs (03/11/2022)   PRAPARE - Hydrologist (Medical): No    Lack of Transportation (Non-Medical): No  Physical Activity: Insufficiently Active (03/11/2022)   Exercise Vital Sign    Days of Exercise per Week: 7 days    Minutes of Exercise per Session: 10 min  Stress: No Stress Concern Present (03/11/2022)   [place]    Feeling of Stress : Not at all  Social Connections: [place] (03/11/2022)   Social Connection and Isolation Panel [NHANES]    Frequency of Communication with Friends and Family: More than three times a week    Frequency of Social Gatherings with Friends and Family: More than three times a week    Attends Religious Services: More than 4 times per year    Active Member of [employer] or Organizations: Yes    Attends Archivist Meetings: More than 4 times per year    Marital Status: Married  Human resources officer Violence: Not At Risk (03/11/2022)   Humiliation, Afraid, Rape, and Kick questionnaire    Fear of Current or Ex-Partner: No    Emotionally Abused: No    Physically Abused: No    Sexually Abused: No  Objective:    BP (!) 122/48   Pulse (!) 56   Temp 97.8 F (36.6 C)   SpO2 96%   Wt Readings from Last 3 Encounters:  03/11/22 190 lb (86.2 kg)  02/14/22 193 lb (87.5 kg)  08/22/21 182 lb (82.6 kg)    Physical Exam Vitals reviewed.  Constitutional:      General: She is not in acute distress.    Appearance: Normal appearance. She is not ill-appearing, toxic-appearing or diaphoretic.  HENT:     Head: Normocephalic and atraumatic.  Eyes:     General: No scleral icterus.       Right eye: No discharge.        Left eye: No discharge.     Conjunctiva/sclera: Conjunctivae normal.  Cardiovascular:     Rate and Rhythm: Normal rate and regular rhythm.     Heart sounds: Normal heart sounds. No murmur heard.    No friction rub. No gallop.  Pulmonary:     Effort: Pulmonary effort is normal. No respiratory distress.     Breath sounds: Normal breath sounds. No stridor. No wheezing, rhonchi or rales.  Musculoskeletal:        General: Normal range of motion.     Cervical back: Normal range of motion.     Right lower leg: Edema present.     Left lower leg: Edema present.  Skin:    General: Skin is warm and  dry.     Capillary Refill: Capillary refill takes less than 2 seconds.  Neurological:     General: No focal deficit present.     Mental Status: She is alert and oriented to person, place, and time. Mental status is at baseline.  Psychiatric:        Mood and Affect: Mood normal.        Behavior: Behavior normal.        Thought Content: Thought content normal.        Judgment: Judgment normal.     Lab Results  Component Value Date   TSH 3.798 02/04/2021   Lab Results  Component Value Date   WBC 8.2 02/19/2021   HGB 10.9 (L) 02/19/2021   HCT 34.6 02/19/2021   MCV 90 02/19/2021   PLT 171 02/19/2021   Lab Results  Component Value Date   NA 143 02/19/2021   K 4.9 02/19/2021   CO2 26 02/19/2021   GLUCOSE 95 02/19/2021   BUN 13 02/19/2021   CREATININE 1.11 (H) 02/19/2021   BILITOT 0.8 02/04/2021   ALKPHOS 102 02/04/2021   AST 22 02/04/2021   ALT 17 02/04/2021   PROT 6.8 02/04/2021   ALBUMIN 2.9 (L) 02/04/2021   CALCIUM 9.2 02/19/2021   ANIONGAP 7 02/04/2021   EGFR 48 (L) 02/19/2021   Lab Results  Component Value Date   CHOL 141 08/29/2020   Lab Results  Component Value Date   HDL 50 08/29/2020   Lab Results  Component Value Date   LDLCALC 70 08/29/2020   Lab Results  Component Value Date   TRIG 118 08/29/2020   Lab Results  Component Value Date   CHOLHDL 2.8 08/29/2020   No results found for: "HGBA1C"

## 2022-05-29 ENCOUNTER — Encounter: Payer: Self-pay | Admitting: Family Medicine

## 2022-05-29 DIAGNOSIS — N183 Chronic kidney disease, stage 3 unspecified: Secondary | ICD-10-CM | POA: Insufficient documentation

## 2022-05-30 LAB — CMP14+EGFR
ALT: 13 IU/L (ref 0–32)
AST: 19 IU/L (ref 0–40)
Albumin/Globulin Ratio: 0.9 — ABNORMAL LOW (ref 1.2–2.2)
Albumin: 3.5 g/dL — ABNORMAL LOW (ref 3.7–4.7)
Alkaline Phosphatase: 120 IU/L (ref 44–121)
BUN/Creatinine Ratio: 18 (ref 12–28)
BUN: 26 mg/dL (ref 8–27)
Bilirubin Total: 0.3 mg/dL (ref 0.0–1.2)
CO2: 25 mmol/L (ref 20–29)
Calcium: 9 mg/dL (ref 8.7–10.3)
Chloride: 105 mmol/L (ref 96–106)
Creatinine, Ser: 1.46 mg/dL — ABNORMAL HIGH (ref 0.57–1.00)
Globulin, Total: 4 g/dL (ref 1.5–4.5)
Glucose: 109 mg/dL — ABNORMAL HIGH (ref 70–99)
Potassium: 4.3 mmol/L (ref 3.5–5.2)
Sodium: 143 mmol/L (ref 134–144)
Total Protein: 7.5 g/dL (ref 6.0–8.5)
eGFR: 35 mL/min/{1.73_m2} — ABNORMAL LOW (ref >59)

## 2022-05-30 LAB — CBC WITH DIFFERENTIAL/PLATELET
Basophils Absolute: 0.1 10*3/uL (ref 0.0–0.2)
Basos: 1 %
EOS (ABSOLUTE): 0.4 10*3/uL (ref 0.0–0.4)
Eos: 7 %
Hematocrit: 34.6 % (ref 34.0–46.6)
Hemoglobin: 11.3 g/dL (ref 11.1–15.9)
Immature Grans (Abs): 0 10*3/uL (ref 0.0–0.1)
Immature Granulocytes: 0 %
Lymphocytes Absolute: 2.3 10*3/uL (ref 0.7–3.1)
Lymphs: 38 %
MCH: 31.1 pg (ref 26.6–33.0)
MCHC: 32.7 g/dL (ref 31.5–35.7)
MCV: 95 fL (ref 79–97)
Monocytes Absolute: 0.7 10*3/uL (ref 0.1–0.9)
Monocytes: 11 %
Neutrophils Absolute: 2.7 10*3/uL (ref 1.4–7.0)
Neutrophils: 43 %
Platelets: 169 10*3/uL (ref 150–450)
RBC: 3.63 x10E6/uL — ABNORMAL LOW (ref 3.77–5.28)
RDW: 11.5 % — ABNORMAL LOW (ref 11.7–15.4)
WBC: 6.1 10*3/uL (ref 3.4–10.8)

## 2022-05-30 LAB — TSH: TSH: 1.87 u[IU]/mL (ref 0.450–4.500)

## 2022-06-09 ENCOUNTER — Telehealth: Payer: Self-pay | Admitting: Nurse Practitioner

## 2022-06-18 ENCOUNTER — Other Ambulatory Visit: Payer: Self-pay | Admitting: Nurse Practitioner

## 2022-06-18 DIAGNOSIS — M79606 Pain in leg, unspecified: Secondary | ICD-10-CM

## 2022-06-18 NOTE — Telephone Encounter (Signed)
I will order  a doppler ultrasound to rule out PAD

## 2022-06-19 ENCOUNTER — Other Ambulatory Visit: Payer: Self-pay

## 2022-06-19 ENCOUNTER — Other Ambulatory Visit: Payer: Self-pay | Admitting: Nurse Practitioner

## 2022-06-19 ENCOUNTER — Telehealth: Payer: Self-pay | Admitting: Nurse Practitioner

## 2022-06-19 DIAGNOSIS — M79606 Pain in leg, unspecified: Secondary | ICD-10-CM

## 2022-06-24 ENCOUNTER — Ambulatory Visit (HOSPITAL_COMMUNITY)
Admission: RE | Admit: 2022-06-24 | Discharge: 2022-06-24 | Disposition: A | Discharge status: Home / Self Care | Payer: Medicare Other | Source: Ambulatory Visit | Attending: Nurse Practitioner | Admitting: Nurse Practitioner

## 2022-06-24 DIAGNOSIS — M25561 Pain in right knee: Secondary | ICD-10-CM | POA: Diagnosis not present

## 2022-06-24 DIAGNOSIS — M25562 Pain in left knee: Secondary | ICD-10-CM | POA: Diagnosis not present

## 2022-06-24 DIAGNOSIS — M79606 Pain in leg, unspecified: Secondary | ICD-10-CM | POA: Diagnosis not present

## 2022-06-25 ENCOUNTER — Other Ambulatory Visit: Payer: Self-pay | Admitting: Cardiology

## 2022-08-13 ENCOUNTER — Other Ambulatory Visit: Payer: Self-pay | Admitting: *Deleted

## 2022-08-13 MED ORDER — AMLODIPINE BESYLATE 5 MG PO TABS: 5.0000 mg | ORAL_TABLET | Freq: Every day | ORAL | 3 refills | Status: DC

## 2022-08-18 ENCOUNTER — Encounter: Payer: Self-pay | Admitting: Cardiology

## 2022-08-18 ENCOUNTER — Ambulatory Visit: Payer: Medicare Other | Attending: Cardiology | Admitting: Cardiology

## 2022-08-18 VITALS — BP 158/70 | HR 68 | Ht 66.0 in | Wt 197.4 lb

## 2022-08-18 DIAGNOSIS — I10 Essential (primary) hypertension: Secondary | ICD-10-CM | POA: Diagnosis not present

## 2022-08-18 DIAGNOSIS — I890 Lymphedema, not elsewhere classified: Secondary | ICD-10-CM

## 2022-08-18 DIAGNOSIS — I4819 Other persistent atrial fibrillation: Secondary | ICD-10-CM

## 2022-08-18 NOTE — Progress Notes (Signed)
Cardiology Office Note  Date: 08/18/2022   ID: Courtney Berry, DOB 11-26-1932, MRN [id number]  PCP:  Courtney Drown, NP  Cardiologist:  Courtney Dell, MD Electrophysiologist:  None   Chief Complaint  Patient presents with   Cardiac follow-up    History of Present Illness: Courtney Berry is an 88 y.o. female last seen in March.  She is here for a follow-up visit.  She does not report any palpitations or chest pain.  Mainly limited by chronic leg pain which is multifactorial in the setting of lymphedema and arthritis.  I reviewed her lab work from July.  She does not report any spontaneous bleeding problems on Eliquis which remains at lower dose, creatinine 1.46 and hemoglobin 11.3.  We went over her cardiac regimen.  She remains on low-dose amiodarone along with Lopressor, heart rate is regular today.  TSH and LFTs were normal in July.  Past Medical History:  Diagnosis Date   CKD (chronic kidney disease) stage 3, GFR 30-59 ml/min (HCC)    COPD (chronic obstructive pulmonary disease) (HCC)    Diverticulosis    Essential hypertension    History of chest pain    Low risk Cardiolite 2010   Lymphedema    Mixed hyperlipidemia    PAF (paroxysmal atrial fibrillation) (HCC)    Rheumatoid arthritis (HCC)     Past Surgical History:  Procedure Laterality Date   TOTAL ABDOMINAL HYSTERECTOMY  11/18/1967    Current Outpatient Medications  Medication Sig Dispense Refill   acetaminophen (TYLENOL) 500 MG tablet Take 1,000 mg by mouth every 6 (six) hours as needed for moderate pain.     amiodarone (PACERONE) 200 MG tablet TAKE 1/2 TABLET BY MOUTH DAILY 15 tablet 6   amLODipine (NORVASC) 5 MG tablet Take 1 tablet (5 mg total) by mouth daily. 90 tablet 3   apixaban (ELIQUIS) 2.5 MG TABS tablet Take 1 tablet (2.5 mg total) by mouth 2 (two) times daily. 60 tablet 6   lisinopril (ZESTRIL) 20 MG tablet TAKE 1 TABLET BY MOUTH EVERY DAY 90 tablet 1   metoprolol tartrate (LOPRESSOR) 25  MG tablet Take 0.5 tablets (12.5 mg total) by mouth 2 (two) times daily. 30 tablet 6   PROAIR HFA 108 (90 Base) MCG/ACT inhaler Inhale 1-2 puffs into the lungs as needed for wheezing or shortness of breath. 18 g 3   Probiotic Product (PROBIOTIC DAILY PO) Take 1 tablet by mouth daily.     umeclidinium-vilanterol (ANORO ELLIPTA) 62.5-25 MCG/INH AEPB Inhale 1 puff into the lungs daily. 60 each 3   No current facility-administered medications for this visit.   Allergies:  Darvocet [propoxyphene n-acetaminophen], Doxycycline, Nitrofurantoin monohyd macro, Plavix [clopidogrel bisulfate], Sulfa antibiotics, Amitriptyline, Gabapentin, Omnicef [cefdinir], and Penicillins   ROS: No orthopnea or PND.  Physical Exam: VS:  BP (!) 158/70   Pulse 68   Ht 5\' 6"  (1.676 m)   Wt 197 lb 6.4 oz (89.5 kg)   SpO2 97%   BMI 31.86 kg/m , BMI Body mass index is 31.86 kg/m.  Wt Readings from Last 3 Encounters:  08/18/22 197 lb 6.4 oz (89.5 kg)  03/11/22 190 lb (86.2 kg)  02/14/22 193 lb (87.5 kg)    General: Patient appears comfortable at rest. HEENT: Conjunctiva and lids normal Neck: Supple, no elevated JVP or carotid bruits, no thyromegaly. Lungs: Clear to auscultation, nonlabored breathing at rest. Cardiac: Regular rate and rhythm, no S3, 1/6 systolic murmur. Extremities: Lymphedema.  ECG:  An ECG  dated 02/14/2022 was personally reviewed today and demonstrated:  Sinus bradycardia with decreased R wave progression and nonspecific T wave changes.  Recent Labwork: 05/27/2022: ALT 13; AST 19; BUN 26; Creatinine, Ser 1.46; Hemoglobin 11.3; Platelets 169; Potassium 4.3; Sodium 143; TSH 1.870     Component Value Date/Time   CHOL 141 08/29/2020 0835   TRIG 118 08/29/2020 0835   HDL 50 08/29/2020 0835   CHOLHDL 2.8 08/29/2020 0835   CHOLHDL 3.7 08/02/2018 0417   VLDL 38 08/02/2018 0417   LDLCALC 70 08/29/2020 0835    Other Studies Reviewed Today:  Echocardiogram 08/02/2018: - Left ventricle: The  cavity size was normal. Wall thickness was    increased increased in a pattern of mild to moderate LVH.    Systolic function was normal. The estimated ejection fraction was    in the range of 60% to 65%. Wall motion was normal; there were no    regional wall motion abnormalities. Doppler parameters are    consistent with abnormal left ventricular relaxation (grade 1    diastolic dysfunction).  - Aortic valve: Moderately calcified annulus. Trileaflet.  - Mitral valve: Mildly calcified annulus. There was trivial    regurgitation.  - Right atrium: Central venous pressure (est): 3 mm Hg.  - Atrial septum: No defect or patent foramen ovale was identified.  - Tricuspid valve: There was trivial regurgitation.  - Pulmonary arteries: Systolic pressure could not be accurately    estimated.  - Pericardium, extracardiac: There was no pericardial effusion.    Lexiscan Myoview 08/30/2018: There was no ST segment deviation noted during stress. The study is normal. No ischemia or scar. This is a low risk study. Nuclear stress EF: 77%.  Assessment and Plan:  1.  Persistent atrial fibrillation, maintaining sinus rhythm recently on current regimen including low-dose amiodarone and Lopressor.  CHA2DS2-VASc score is 4 and she is on Eliquis for stroke prophylaxis.  Continue current medication doses, lab work reviewed.  2.  Essential hypertension, on Norvasc and lisinopril.  No changes were made today.  3.  Lymphedema.  She does have renal insufficiency, CKD stage IIIb and currently not on a diuretic.  Not clear that addition of diuretic would make a tremendous impact on her lymphedema without further affecting renal insufficiency, could be considered for referral for PT and mechanical compression if this worsens.  Medication Adjustments/Labs and Tests Ordered: Current medicines are reviewed at length with the patient today.  Concerns regarding medicines are outlined above.   Tests Ordered: No orders of  the defined types were placed in this encounter.   Medication Changes: No orders of the defined types were placed in this encounter.   Disposition:  Follow up  6 months.  Signed, Satira Sark, MD, [facility] 08/18/2022 1:07 PM    [facility] [place], [place], [place] [address] Phone: [phone]; Fax: [phone]

## 2022-08-18 NOTE — Patient Instructions (Signed)

## 2022-08-26 ENCOUNTER — Encounter: Payer: Medicare Other | Admitting: Nurse Practitioner

## 2022-08-28 ENCOUNTER — Encounter: Payer: Medicare Other | Admitting: Family Medicine

## 2022-08-29 ENCOUNTER — Encounter: Payer: Medicare Other | Admitting: Nurse Practitioner

## 2022-10-07 ENCOUNTER — Encounter: Payer: Medicare Other | Admitting: Nurse Practitioner

## 2022-10-15 DIAGNOSIS — R059 Cough, unspecified: Secondary | ICD-10-CM | POA: Diagnosis not present

## 2022-10-15 DIAGNOSIS — R062 Wheezing: Secondary | ICD-10-CM | POA: Diagnosis not present

## 2022-10-15 DIAGNOSIS — Z20822 Contact with and (suspected) exposure to covid-19: Secondary | ICD-10-CM | POA: Diagnosis not present

## 2022-10-15 DIAGNOSIS — J40 Bronchitis, not specified as acute or chronic: Secondary | ICD-10-CM | POA: Diagnosis not present

## 2022-10-16 ENCOUNTER — Telehealth: Payer: Self-pay | Admitting: Cardiology

## 2022-10-16 NOTE — Telephone Encounter (Signed)
Pt's daughter Harriette Ohara came into the office stating that the pt has Bronchitis and the Dr wanted to start her on Levofloxacin 250mg  1x a day and he wanted to be sure it was ok for her to take due to her heart conditions.   Please give a call @ [phone]

## 2022-10-16 NOTE — Telephone Encounter (Signed)
Patient is on amiodarone which prolong Qtc and levaquin also prolong Qtc. Patient QTC 498 on 31/01/2022. Combined use of drugs that prolong the QTc interval may further increase the risk for serious toxicities. Recommend switching to alternate antibiotic.

## 2022-10-16 NOTE — Telephone Encounter (Signed)
lmtcb

## 2022-10-16 NOTE — Telephone Encounter (Signed)
Patient's daughter calling back. 

## 2022-10-16 NOTE — Telephone Encounter (Signed)
Per DPR, patients daughter Zella Ball made aware. States that she is going to reach out to the prescribing doctor and have the antibiotic swtiched to something else.

## 2022-11-18 ENCOUNTER — Other Ambulatory Visit: Payer: Self-pay | Admitting: Cardiology

## 2022-11-18 ENCOUNTER — Encounter: Payer: Medicare Other | Admitting: Nurse Practitioner

## 2022-11-18 NOTE — Telephone Encounter (Signed)
Prescription refill request for Eliquis received. Indication: PAF Last office visit: 08/18/22  Courtney Gip MD Scr: 1.46 on 05/27/22 Age: 89 Weight: 89.5kg  Per LOV with Dr Domenic Polite will continue Eliquis 2.5mg  twice daily based on age and borderline Scr.  Refill approved.

## 2022-11-19 ENCOUNTER — Encounter: Payer: Self-pay | Admitting: Nurse Practitioner

## 2022-11-22 ENCOUNTER — Other Ambulatory Visit: Payer: Self-pay | Admitting: Cardiology

## 2022-12-25 ENCOUNTER — Other Ambulatory Visit: Payer: Self-pay | Admitting: Cardiology

## 2023-01-06 ENCOUNTER — Ambulatory Visit (INDEPENDENT_AMBULATORY_CARE_PROVIDER_SITE_OTHER): Payer: Medicare Other | Admitting: Family Medicine

## 2023-01-06 ENCOUNTER — Encounter: Payer: Self-pay | Admitting: Family Medicine

## 2023-01-06 VITALS — BP 159/67 | HR 63 | Temp 98.5°F | Ht 66.0 in | Wt 202.0 lb

## 2023-01-06 DIAGNOSIS — I890 Lymphedema, not elsewhere classified: Secondary | ICD-10-CM | POA: Diagnosis not present

## 2023-01-06 DIAGNOSIS — N1832 Chronic kidney disease, stage 3b: Secondary | ICD-10-CM

## 2023-01-06 DIAGNOSIS — L03115 Cellulitis of right lower limb: Secondary | ICD-10-CM

## 2023-01-06 DIAGNOSIS — Z6832 Body mass index (BMI) 32.0-32.9, adult: Secondary | ICD-10-CM

## 2023-01-06 DIAGNOSIS — E669 Obesity, unspecified: Secondary | ICD-10-CM

## 2023-01-06 DIAGNOSIS — I129 Hypertensive chronic kidney disease with stage 1 through stage 4 chronic kidney disease, or unspecified chronic kidney disease: Secondary | ICD-10-CM

## 2023-01-06 DIAGNOSIS — L03116 Cellulitis of left lower limb: Secondary | ICD-10-CM

## 2023-01-06 DIAGNOSIS — R739 Hyperglycemia, unspecified: Secondary | ICD-10-CM | POA: Diagnosis not present

## 2023-01-06 LAB — BAYER DCA HB A1C WAIVED: HB A1C (BAYER DCA - WAIVED): 5.4 % (ref 4.8–5.6)

## 2023-01-06 MED ORDER — CEPHALEXIN 250 MG PO CAPS: 250.0000 mg | ORAL_CAPSULE | Freq: Four times a day (QID) | ORAL | 0 refills | Status: AC

## 2023-01-06 NOTE — Progress Notes (Addendum)
New Patient Office Visit  Subjective    Patient ID: Courtney Berry, female    DOB: 1933-11-11  Age: 89 y.o. MRN: [id number]  CC:  Chief Complaint  Patient presents with   Establish Care    Former Courtney Berry, Courtney Berry patient     HPI Courtney Berry presents to establish care.  Today her main concern is her lymphedema.  States she has no issues with compliance of medications. Helped by husband.  Managed by cardiology for atrial fibrillation, vascular dx, and hypertension.  States that the swelling has been going on for several weeks, but is getting worse. She is unable ot sleep due to pain. Has not tried anything to help it. States that she was previously shown how to wrap her legs and states she has supplies but has not been using it. States her right leg is worse. Legs are increasingly sore, erythematous, and warm to touch. Denies fever.   Outpatient Encounter Medications as of 01/06/2023  Medication Sig   acetaminophen (TYLENOL) 500 MG tablet Take 1,000 mg by mouth every 6 (six) hours as needed for moderate pain.   amiodarone (PACERONE) 200 MG tablet TAKE 1/2 TABLET BY MOUTH DAILY   amLODipine (NORVASC) 5 MG tablet Take 1 tablet (5 mg total) by mouth daily.   apixaban (ELIQUIS) 2.5 MG TABS tablet TAKE 1 TABLET BY MOUTH TWICE A DAY   lisinopril (ZESTRIL) 20 MG tablet TAKE 1 TABLET BY MOUTH EVERY DAY   metoprolol tartrate (LOPRESSOR) 25 MG tablet TAKE 0.5 TABLETS BY MOUTH 2 TIMES DAILY.   Probiotic Product (PROBIOTIC DAILY PO) Take 1 tablet by mouth daily.   albuterol (PROVENTIL) (2.5 MG/3ML) 0.083% nebulizer solution SMARTSIG:3 Milliliter(s) Via Nebulizer Every 4 Hours PRN (Patient not taking: Reported on 01/06/2023)   PROAIR HFA 108 (90 Base) MCG/ACT inhaler Inhale 1-2 puffs into the lungs as needed for wheezing or shortness of breath. (Patient not taking: Reported on 01/06/2023)   umeclidinium-vilanterol (ANORO ELLIPTA) 62.5-25 MCG/INH AEPB Inhale 1 puff into the lungs daily.  (Patient not taking: Reported on 01/06/2023)   No facility-administered encounter medications on file as of 01/06/2023.    Past Medical History:  Diagnosis Date   CKD (chronic kidney disease) stage 3, GFR 30-59 ml/min (HCC)    COPD (chronic obstructive pulmonary disease) (HCC)    Diverticulosis    Essential hypertension    History of chest pain    Low risk Cardiolite 2010   Lymphedema    Mixed hyperlipidemia    PAF (paroxysmal atrial fibrillation) (HCC)    Rheumatoid arthritis (HCC)     Past Surgical History:  Procedure Laterality Date   TOTAL ABDOMINAL HYSTERECTOMY  11/18/1967    Family History  Problem Relation Age of Onset   Colon cancer Father    Cancer Mother    Heart disease Sister        CABG age 14   Cancer Brother     Social History   Socioeconomic History   Marital status: Married    Spouse name: Not on file   Number of children: 2   Years of education: Not on file   Highest education level: Not on file  Occupational History   Occupation: Retired  Tobacco Use   Smoking status: Never   Smokeless tobacco: Never   Tobacco comments:    Tobacco use-no  Vaping Use   Vaping Use: Never used  Substance and Sexual Activity   Alcohol use: No    Alcohol/week: 0.0 standard  drinks of alcohol   Drug use: No   Sexual activity: Not on file  Other Topics Concern   Not on file  Social History Narrative   Lives with her husband - one level living, handicap accessible home - Son and daughter both live very close by.   Social Determinants of Health   Financial Resource Strain: Low Risk  (03/11/2022)   Overall Financial Resource Strain (CARDIA)    Difficulty of Paying Living Expenses: Not hard at all  Food Insecurity: No Food Insecurity (03/11/2022)   Hunger Vital Sign    Worried About Running Out of Food in the Last Year: Never true    Ran Out of Food in the Last Year: Never true  Transportation Needs: No Transportation Needs (03/11/2022)   PRAPARE -  Hydrologist (Medical): No    Lack of Transportation (Non-Medical): No  Physical Activity: Insufficiently Active (03/11/2022)   Exercise Vital Sign    Days of Exercise per Week: 7 days    Minutes of Exercise per Session: 10 min  Stress: No Stress Concern Present (03/11/2022)   [place]    Feeling of Stress : Not at all  Social Connections: [place] (03/11/2022)   Social Connection and Isolation Panel [NHANES]    Frequency of Communication with Friends and Family: More than three times a week    Frequency of Social Gatherings with Friends and Family: More than three times a week    Attends Religious Services: More than 4 times per year    Active Member of [employer] or Organizations: Yes    Attends Music therapist: More than 4 times per year    Marital Status: Married  Human resources officer Violence: Not At Risk (03/11/2022)   Humiliation, Afraid, Rape, and Kick questionnaire    Fear of Current or Ex-Partner: No    Emotionally Abused: No    Physically Abused: No    Sexually Abused: No    ROS As per HPI     Objective    BP (!) 159/67   Pulse 63   Temp 98.5 F (36.9 C)   Ht 5' 6"$  (1.676 m)   Wt 202 lb (91.6 kg)   SpO2 97%   BMI 32.60 kg/m   Physical Exam Constitutional:      General: She is not in acute distress.    Appearance: Normal appearance. She is not ill-appearing, toxic-appearing or diaphoretic.  Cardiovascular:     Rate and Rhythm: Normal rate.     Pulses: Normal pulses.     Heart sounds: No murmur heard.    No gallop.  Pulmonary:     Effort: Pulmonary effort is normal. No respiratory distress.     Breath sounds: Normal breath sounds. No stridor. No wheezing, rhonchi or rales.  Musculoskeletal:     Right lower leg: Swelling present. 4+ Pitting Edema present.     Left lower leg: Swelling present. 4+ Pitting Edema present.     Comments: Skin flaky  and hardened, erythematous bilateral lower legs   Skin:    General: Skin is warm.     Capillary Refill: Capillary refill takes less than 2 seconds.  Neurological:     General: No focal deficit present.     Mental Status: She is alert and oriented to person, place, and time. Mental status is at baseline.     Motor: No weakness.  Psychiatric:  Mood and Affect: Mood normal.        Behavior: Behavior normal.        Thought Content: Thought content normal.        Judgment: Judgment normal.       Assessment & Plan:  1. Stage 3b chronic kidney disease ([place]) Labs as below. Will communicate results to patient once available. Keflex renal dosed. Will monitor at chronic condition follow up.  - CMP14+EGFR - CBC with Differential/Platelet - Brain natriuretic peptide  2. Lymphedema of both lower extremities Referral as below. Unaboot placed today in clinic. Pt educated on use of unaboot and when to return to remove it.  - Ambulatory referral to Physical Therapy  3. Bilateral cellulitis of lower leg Medication as below. Discussed with pt that she has tolerated keflex in the past well.  - cephALEXin (KEFLEX) 250 MG capsule; Take 1 capsule (250 mg total) by mouth 4 (four) times daily for 7 days.  Dispense: 28 capsule; Refill: 0  4. Obesity (BMI 30.0-34.9) Given risk factors for diabetes and previously elevated glucose on labs will complete labs as below. Will communicate results to patient once available.  - Bayer DCA Hb A1c Waived  The above assessment and management plan was discussed with the patient. The patient verbalized understanding of and has agreed to the management plan using shared-decision making. Patient is aware to call the clinic if they develop any new symptoms or if symptoms fail to improve or worsen. Patient is aware when to return to the clinic for a follow-up visit. Patient educated on when it is appropriate to go to the emergency department.   Donzetta Kohut,  DNP-FNP [facility] [address] [place], [place] [address] [phone]

## 2023-01-06 NOTE — Addendum Note (Signed)
Addended by: Geryl Rankins D on: 01/06/2023 02:33 PM   Modules accepted: Orders

## 2023-01-07 ENCOUNTER — Telehealth: Payer: Self-pay | Admitting: Family Medicine

## 2023-01-07 LAB — CMP14+EGFR
ALT: 12 IU/L (ref 0–32)
AST: 19 IU/L (ref 0–40)
Albumin/Globulin Ratio: 0.9 — ABNORMAL LOW (ref 1.2–2.2)
Albumin: 3.5 g/dL — ABNORMAL LOW (ref 3.7–4.7)
Alkaline Phosphatase: 139 IU/L — ABNORMAL HIGH (ref 44–121)
BUN/Creatinine Ratio: 14 (ref 12–28)
BUN: 17 mg/dL (ref 8–27)
Bilirubin Total: 0.4 mg/dL (ref 0.0–1.2)
CO2: 27 mmol/L (ref 20–29)
Calcium: 9 mg/dL (ref 8.7–10.3)
Chloride: 104 mmol/L (ref 96–106)
Creatinine, Ser: 1.2 mg/dL — ABNORMAL HIGH (ref 0.57–1.00)
Globulin, Total: 3.7 g/dL (ref 1.5–4.5)
Glucose: 88 mg/dL (ref 70–99)
Potassium: 4.6 mmol/L (ref 3.5–5.2)
Sodium: 142 mmol/L (ref 134–144)
Total Protein: 7.2 g/dL (ref 6.0–8.5)
eGFR: 44 mL/min/{1.73_m2} — ABNORMAL LOW (ref >59)

## 2023-01-07 LAB — CBC WITH DIFFERENTIAL/PLATELET
Basophils Absolute: 0.1 10*3/uL (ref 0.0–0.2)
Basos: 1 %
EOS (ABSOLUTE): 0.5 10*3/uL — ABNORMAL HIGH (ref 0.0–0.4)
Eos: 7 %
Hematocrit: 37.2 % (ref 34.0–46.6)
Hemoglobin: 12.2 g/dL (ref 11.1–15.9)
Immature Grans (Abs): 0 10*3/uL (ref 0.0–0.1)
Immature Granulocytes: 0 %
Lymphocytes Absolute: 2.6 10*3/uL (ref 0.7–3.1)
Lymphs: 37 %
MCH: 31.6 pg (ref 26.6–33.0)
MCHC: 32.8 g/dL (ref 31.5–35.7)
MCV: 96 fL (ref 79–97)
Monocytes Absolute: 0.8 10*3/uL (ref 0.1–0.9)
Monocytes: 11 %
Neutrophils Absolute: 3.1 10*3/uL (ref 1.4–7.0)
Neutrophils: 44 %
Platelets: 174 10*3/uL (ref 150–450)
RBC: 3.86 x10E6/uL (ref 3.77–5.28)
RDW: 11.4 % — ABNORMAL LOW (ref 11.7–15.4)
WBC: 7.1 10*3/uL (ref 3.4–10.8)

## 2023-01-07 LAB — BRAIN NATRIURETIC PEPTIDE: BNP: 56.4 pg/mL (ref 0.0–100.0)

## 2023-01-07 NOTE — Addendum Note (Signed)
Addended by: Donzetta Kohut on: 01/07/2023 12:18 PM   Modules accepted: Orders

## 2023-01-08 NOTE — Telephone Encounter (Signed)
R/c

## 2023-01-08 NOTE — Telephone Encounter (Signed)
Patients daughter aware

## 2023-01-08 NOTE — Telephone Encounter (Signed)
Left message to call back  

## 2023-01-09 ENCOUNTER — Ambulatory Visit (INDEPENDENT_AMBULATORY_CARE_PROVIDER_SITE_OTHER): Payer: Medicare Other | Admitting: Family

## 2023-01-09 ENCOUNTER — Encounter: Payer: Self-pay | Admitting: Family

## 2023-01-09 VITALS — BP 150/70 | HR 56 | Temp 97.6°F | Ht 66.0 in

## 2023-01-09 DIAGNOSIS — L03116 Cellulitis of left lower limb: Secondary | ICD-10-CM

## 2023-01-09 DIAGNOSIS — L03115 Cellulitis of right lower limb: Secondary | ICD-10-CM | POA: Diagnosis not present

## 2023-01-09 DIAGNOSIS — N1832 Chronic kidney disease, stage 3b: Secondary | ICD-10-CM

## 2023-01-09 MED ORDER — FUROSEMIDE 20 MG PO TABS: 20.0000 mg | ORAL_TABLET | Freq: Every day | ORAL | 0 refills | Status: DC

## 2023-01-09 NOTE — Progress Notes (Signed)
   Subjective:    Patient ID: Courtney Berry, female    DOB: 11-Apr-1933, 89 y.o.   MRN: [id number]  Chief Complaint  Patient presents with   Leg Swelling    HPI  Pt presents to the office today to follow up on cellulitis of bilateral extremity. She was seen on 01/06/23 and started on Keflex QID and unna boots applied. States her redness is greatly improved. Denies any pain.   PT states her Cardiologists told her she could only take lasix in "small doses".   Review of Systems  All other systems reviewed and are negative.      Objective:   Physical Exam Vitals reviewed.  Constitutional:      General: She is not in acute distress.    Appearance: She is well-developed. She is obese.  HENT:     Head: Normocephalic and atraumatic.  Eyes:     Pupils: Pupils are equal, round, and reactive to light.  Neck:     Thyroid: No thyromegaly.  Cardiovascular:     Rate and Rhythm: Normal rate and regular rhythm.     Heart sounds: Normal heart sounds. No murmur heard. Pulmonary:     Effort: Pulmonary effort is normal. No respiratory distress.     Breath sounds: Normal breath sounds. No wheezing.  Abdominal:     General: Bowel sounds are normal. There is no distension.     Palpations: Abdomen is soft.     Tenderness: There is no abdominal tenderness.  Musculoskeletal:        General: No tenderness. Normal range of motion.     Cervical back: Normal range of motion and neck supple.     Right lower leg: Edema (3+) present.     Left lower leg: Edema (3+) present.     Comments: Bilateral erythemas, mildly warm  Skin:    General: Skin is warm and dry.  Neurological:     Mental Status: She is alert and oriented to person, place, and time.     Cranial Nerves: No cranial nerve deficit.     Deep Tendon Reflexes: Reflexes are normal and symmetric.  Psychiatric:        Behavior: Behavior normal.        Thought Content: Thought content normal.        Judgment: Judgment normal.      BP  (!) 150/70   Pulse (!) 56   Temp 97.6 F (36.4 C) (Temporal)   Ht 5' 6"$  (1.676 m)   SpO2 96%   BMI 32.60 kg/m       Assessment & Plan:  Courtney Berry comes in today with chief complaint of Leg Swelling   Diagnosis and orders addressed:  1. Bilateral cellulitis of lower leg Bilateral legs wrapped with ACE bandages Elevated Continue Keflex Will give 3-5 days of lasix, denies any allergic reaction of HIVES, SOB, or facial edema  RTO in 5 days to recheck  - furosemide (LASIX) 20 MG tablet; Take 1 tablet (20 mg total) by mouth daily.  Dispense: 5 tablet; Refill: 0  2. Stage 3b chronic kidney disease ([place])    Evelina Dun, FNP

## 2023-01-09 NOTE — Patient Instructions (Signed)

## 2023-01-12 ENCOUNTER — Ambulatory Visit: Payer: Medicare Other

## 2023-01-12 ENCOUNTER — Ambulatory Visit (INDEPENDENT_AMBULATORY_CARE_PROVIDER_SITE_OTHER): Payer: Medicare Other | Admitting: Family

## 2023-01-12 ENCOUNTER — Encounter: Payer: Self-pay | Admitting: Family

## 2023-01-12 VITALS — BP 138/79 | HR 67 | Temp 97.3°F | Resp 20

## 2023-01-12 DIAGNOSIS — N1832 Chronic kidney disease, stage 3b: Secondary | ICD-10-CM | POA: Diagnosis not present

## 2023-01-12 DIAGNOSIS — L03115 Cellulitis of right lower limb: Secondary | ICD-10-CM | POA: Diagnosis not present

## 2023-01-12 DIAGNOSIS — I129 Hypertensive chronic kidney disease with stage 1 through stage 4 chronic kidney disease, or unspecified chronic kidney disease: Secondary | ICD-10-CM | POA: Diagnosis not present

## 2023-01-12 DIAGNOSIS — L03116 Cellulitis of left lower limb: Secondary | ICD-10-CM

## 2023-01-12 DIAGNOSIS — R609 Edema, unspecified: Secondary | ICD-10-CM | POA: Diagnosis not present

## 2023-01-12 NOTE — Patient Instructions (Signed)
Edema  Edema is an abnormal buildup of fluids in the body tissues and under the skin. Swelling of the legs, feet, and ankles is a common symptom that becomes more likely as you get older. Swelling is also common in looser tissues, such as around the eyes. Pressing on the area may make a temporary dent in your skin (pitting edema). This fluid may also accumulate in your lungs (pulmonary edema). There are many possible causes of edema. Eating too much salt (sodium) and being on your feet or sitting for a long time can cause edema in your legs, feet, and ankles. Common causes of edema include: Certain medical conditions, such as heart failure, liver or kidney disease, and cancer. Weak leg blood vessels. An injury. Pregnancy. Medicines. Being obese. Low protein levels in the blood. Hot weather may make edema worse. Edema is usually painless. Your skin may look swollen or shiny. Follow these instructions at home: Medicines Take over-the-counter and prescription medicines only as told by your health care provider. Your health care provider may prescribe a medicine to help your body get rid of extra water (diuretic). Take this medicine if you are told to take it. Eating and drinking Eat a low-salt (low-sodium) diet to reduce fluid as told by your health care provider. Sometimes, eating less salt may reduce swelling. Depending on the cause of your swelling, you may need to limit how much fluid you drink (fluid restriction). General instructions Raise (elevate) the injured area above the level of your heart while you are sitting or lying down. Do not sit still or stand for long periods of time. Do not wear tight clothing. Do not wear garters on your upper legs. Exercise your legs to get your circulation going. This helps to move the fluid back into your blood vessels, and it may help the swelling go down. Wear compression stockings as told by your health care provider. These stockings help to prevent  blood clots and reduce swelling in your legs. It is important that these are the correct size. These stockings should be prescribed by your health care provider to prevent possible injuries. If elastic bandages or wraps are recommended, use them as told by your health care provider. Contact a health care provider if: Your edema does not get better with treatment. You have heart, liver, or kidney disease and have symptoms of edema. You have sudden and unexplained weight gain. Get help right away if: You develop shortness of breath or chest pain. You cannot breathe when you lie down. You develop pain, redness, or warmth in the swollen areas. You have heart, liver, or kidney disease and suddenly get edema. You have a fever and your symptoms suddenly get worse. These symptoms may be an emergency. Get help right away. Call 911. Do not wait to see if the symptoms will go away. Do not drive yourself to the hospital. Summary Edema is an abnormal buildup of fluids in the body tissues and under the skin. Eating too much salt (sodium)and being on your feet or sitting for a long time can cause edema in your legs, feet, and ankles. Raise (elevate) the injured area above the level of your heart while you are sitting or lying down. Follow your health care provider's instructions about diet and how much fluid you can drink. This information is not intended to replace advice given to you by your health care provider. Make sure you discuss any questions you have with your health care provider. Document Revised: 07/08/2021 Document   Reviewed: 07/08/2021 Elsevier Patient Education  2023 [employer]vier Inc.  

## 2023-01-12 NOTE — Progress Notes (Signed)
    Subjective:    Patient ID: Courtney Berry, female    DOB: [birth year], 89 y.o.   MRN: [id number]  Chief Complaint  Patient presents with   Follow up cellulitis    HPI Pt presents to the office today to follow up on cellulitis of bilateral extremity. She was seen on 01/06/23 and started on Keflex QID and unna boots applied. Then seen on 01/09/23 and told to complete Keflex, wrapped legs with ACE wrap, and given 5 days of lasix.   The edema in her legs have greatly improved! Denies any pain, warmth. Does have mildly erythemas bilaterally.    She has CKD and limits NSAIDs.   Review of Systems  Cardiovascular:  Positive for leg swelling.  All other systems reviewed and are negative.      Objective:   Physical Exam Vitals reviewed.  Constitutional:      General: She is not in acute distress.    Appearance: She is well-developed.  HENT:     Head: Normocephalic and atraumatic.  Eyes:     Pupils: Pupils are equal, round, and reactive to light.  Neck:     Thyroid: No thyromegaly.  Cardiovascular:     Rate and Rhythm: Normal rate and regular rhythm.     Heart sounds: Normal heart sounds. No murmur heard. Pulmonary:     Effort: Pulmonary effort is normal. No respiratory distress.     Breath sounds: Normal breath sounds. No wheezing.  Abdominal:     General: Bowel sounds are normal. There is no distension.     Palpations: Abdomen is soft.     Tenderness: There is no abdominal tenderness.  Musculoskeletal:        General: No tenderness. Normal range of motion.     Cervical back: Normal range of motion and neck supple.     Right lower leg: Edema (3+) present.     Left lower leg: Edema (3+) present.  Skin:    General: Skin is warm and dry.  Neurological:     Mental Status: She is alert and oriented to person, place, and time.     Cranial Nerves: No cranial nerve deficit.     Deep Tendon Reflexes: Reflexes are normal and symmetric.  Psychiatric:        Behavior: Behavior  normal.        Thought Content: Thought content normal.        Judgment: Judgment normal.      BP 138/79   Pulse 67   Temp (!) 97.3 F (36.3 C) (Oral)   Resp 20   SpO2 97%       Assessment & Plan:   Courtney Berry comes in today with chief complaint of Follow up cellulitis   Diagnosis and orders addressed:  1. Peripheral edema - BMP8+EGFR  2. Stage 3b chronic kidney disease ([place])  3. Bilateral cellulitis of lower leg Resolved   Wear compression hose daily Will check BMP Keep leg elevated  Follow up plan: Keep follow up with PCP  Evelina Dun, FNP

## 2023-01-13 ENCOUNTER — Telehealth: Payer: Self-pay

## 2023-01-13 LAB — BMP8+EGFR
BUN/Creatinine Ratio: 14 (ref 12–28)
BUN: 18 mg/dL (ref 8–27)
CO2: 27 mmol/L (ref 20–29)
Calcium: 9 mg/dL (ref 8.7–10.3)
Chloride: 104 mmol/L (ref 96–106)
Creatinine, Ser: 1.25 mg/dL — ABNORMAL HIGH (ref 0.57–1.00)
Glucose: 85 mg/dL (ref 70–99)
Potassium: 4.3 mmol/L (ref 3.5–5.2)
Sodium: 144 mmol/L (ref 134–144)
eGFR: 42 mL/min/{1.73_m2} — ABNORMAL LOW (ref >59)

## 2023-01-13 NOTE — Telephone Encounter (Signed)
FYI, daughter called and reports that mother's "heart is acting up and beating funny and we think she is having a reaction to her antibiotic that was given to her."  I advised because of the time of day she called and her mother's history of a-fib that she should be taken to the ER for evaluation.  Daughter agreed and will follow up with [place] as needed.

## 2023-01-16 ENCOUNTER — Ambulatory Visit: Payer: Medicare Other | Admitting: Family Medicine

## 2023-01-21 ENCOUNTER — Ambulatory Visit: Payer: Medicare Other | Admitting: Family Medicine

## 2023-01-22 ENCOUNTER — Ambulatory Visit: Payer: Medicare Other | Admitting: Family Medicine

## 2023-01-22 DIAGNOSIS — R509 Fever, unspecified: Secondary | ICD-10-CM | POA: Diagnosis not present

## 2023-01-22 DIAGNOSIS — R410 Disorientation, unspecified: Secondary | ICD-10-CM | POA: Diagnosis not present

## 2023-01-22 DIAGNOSIS — R0902 Hypoxemia: Secondary | ICD-10-CM | POA: Diagnosis not present

## 2023-01-22 DIAGNOSIS — Z743 Need for continuous supervision: Secondary | ICD-10-CM | POA: Diagnosis not present

## 2023-01-23 ENCOUNTER — Encounter: Payer: Self-pay | Admitting: Family Medicine

## 2023-01-23 DIAGNOSIS — I480 Paroxysmal atrial fibrillation: Secondary | ICD-10-CM | POA: Diagnosis not present

## 2023-01-23 DIAGNOSIS — Z882 Allergy status to sulfonamides status: Secondary | ICD-10-CM | POA: Diagnosis not present

## 2023-01-23 DIAGNOSIS — R112 Nausea with vomiting, unspecified: Secondary | ICD-10-CM | POA: Diagnosis not present

## 2023-01-23 DIAGNOSIS — Z79899 Other long term (current) drug therapy: Secondary | ICD-10-CM | POA: Diagnosis not present

## 2023-01-23 DIAGNOSIS — J449 Chronic obstructive pulmonary disease, unspecified: Secondary | ICD-10-CM | POA: Diagnosis not present

## 2023-01-23 DIAGNOSIS — R1110 Vomiting, unspecified: Secondary | ICD-10-CM | POA: Diagnosis not present

## 2023-01-23 DIAGNOSIS — R8271 Bacteriuria: Secondary | ICD-10-CM | POA: Diagnosis not present

## 2023-01-23 DIAGNOSIS — Z7901 Long term (current) use of anticoagulants: Secondary | ICD-10-CM | POA: Diagnosis not present

## 2023-01-23 DIAGNOSIS — E785 Hyperlipidemia, unspecified: Secondary | ICD-10-CM | POA: Diagnosis not present

## 2023-01-23 DIAGNOSIS — I2510 Atherosclerotic heart disease of native coronary artery without angina pectoris: Secondary | ICD-10-CM | POA: Insufficient documentation

## 2023-01-23 DIAGNOSIS — M069 Rheumatoid arthritis, unspecified: Secondary | ICD-10-CM | POA: Diagnosis not present

## 2023-01-23 DIAGNOSIS — I129 Hypertensive chronic kidney disease with stage 1 through stage 4 chronic kidney disease, or unspecified chronic kidney disease: Secondary | ICD-10-CM | POA: Diagnosis not present

## 2023-01-23 DIAGNOSIS — D72829 Elevated white blood cell count, unspecified: Secondary | ICD-10-CM | POA: Diagnosis not present

## 2023-01-23 DIAGNOSIS — I700 Atherosclerosis of aorta: Secondary | ICD-10-CM | POA: Diagnosis not present

## 2023-01-23 DIAGNOSIS — N10 Acute tubulo-interstitial nephritis: Secondary | ICD-10-CM | POA: Diagnosis not present

## 2023-01-23 DIAGNOSIS — I890 Lymphedema, not elsewhere classified: Secondary | ICD-10-CM | POA: Diagnosis not present

## 2023-01-23 DIAGNOSIS — Z792 Long term (current) use of antibiotics: Secondary | ICD-10-CM | POA: Diagnosis not present

## 2023-01-23 DIAGNOSIS — R109 Unspecified abdominal pain: Secondary | ICD-10-CM | POA: Diagnosis not present

## 2023-01-23 DIAGNOSIS — N183 Chronic kidney disease, stage 3 unspecified: Secondary | ICD-10-CM | POA: Diagnosis not present

## 2023-01-23 DIAGNOSIS — E876 Hypokalemia: Secondary | ICD-10-CM | POA: Diagnosis not present

## 2023-01-23 DIAGNOSIS — N189 Chronic kidney disease, unspecified: Secondary | ICD-10-CM | POA: Diagnosis not present

## 2023-01-23 DIAGNOSIS — R531 Weakness: Secondary | ICD-10-CM | POA: Insufficient documentation

## 2023-01-23 DIAGNOSIS — Z7951 Long term (current) use of inhaled steroids: Secondary | ICD-10-CM | POA: Diagnosis not present

## 2023-01-23 DIAGNOSIS — K8020 Calculus of gallbladder without cholecystitis without obstruction: Secondary | ICD-10-CM | POA: Diagnosis not present

## 2023-01-23 DIAGNOSIS — R350 Frequency of micturition: Secondary | ICD-10-CM | POA: Diagnosis not present

## 2023-01-23 DIAGNOSIS — Z880 Allergy status to penicillin: Secondary | ICD-10-CM | POA: Diagnosis not present

## 2023-01-23 DIAGNOSIS — R059 Cough, unspecified: Secondary | ICD-10-CM | POA: Diagnosis not present

## 2023-01-23 DIAGNOSIS — Z1152 Encounter for screening for COVID-19: Secondary | ICD-10-CM | POA: Diagnosis not present

## 2023-01-23 DIAGNOSIS — K529 Noninfective gastroenteritis and colitis, unspecified: Secondary | ICD-10-CM | POA: Diagnosis not present

## 2023-01-23 DIAGNOSIS — N12 Tubulo-interstitial nephritis, not specified as acute or chronic: Secondary | ICD-10-CM | POA: Diagnosis not present

## 2023-01-23 DIAGNOSIS — Z888 Allergy status to other drugs, medicaments and biological substances status: Secondary | ICD-10-CM | POA: Diagnosis not present

## 2023-01-23 DIAGNOSIS — N3091 Cystitis, unspecified with hematuria: Secondary | ICD-10-CM | POA: Diagnosis not present

## 2023-01-23 DIAGNOSIS — R197 Diarrhea, unspecified: Secondary | ICD-10-CM | POA: Diagnosis not present

## 2023-01-23 DIAGNOSIS — K5730 Diverticulosis of large intestine without perforation or abscess without bleeding: Secondary | ICD-10-CM | POA: Diagnosis not present

## 2023-01-24 DIAGNOSIS — A0472 Enterocolitis due to Clostridium difficile, not specified as recurrent: Secondary | ICD-10-CM | POA: Insufficient documentation

## 2023-01-26 ENCOUNTER — Telehealth: Payer: Self-pay | Admitting: Family Medicine

## 2023-01-26 NOTE — Telephone Encounter (Signed)
I spoke to pt and she declines outpatient PT stating she doesn't want it.

## 2023-01-26 NOTE — Telephone Encounter (Signed)
[facility] Social Worker) called to inform patients PCP that patient needs outpatient PT at [facility] and the hospital can't send the order. Needs PCP to send.   Fax# is [phone]

## 2023-01-27 ENCOUNTER — Encounter: Payer: Self-pay | Admitting: *Deleted

## 2023-01-27 ENCOUNTER — Telehealth: Payer: Self-pay | Admitting: *Deleted

## 2023-01-27 NOTE — Transitions of Care (Post Inpatient/ED Visit) (Signed)
01/27/2023  Name: Courtney Berry MRN: [id number] DOB: 1933/03/14  Today's TOC FU Call Status: Today's TOC FU Call Status:: Successful TOC FU Call Competed TOC FU Call Complete Date: 01/27/23  Transition Care Management Follow-up Telephone Call Date of Discharge: 01/26/23 Discharge Facility: Other ([facility]) Name of Other ([facility]) Discharge Facility: [place] Type of Discharge: Inpatient Admission Primary Inpatient Discharge Diagnosis:: acute pylenephritis; N/V/D How have you been since you were released from the hospital?: Better ("I am doing just fine.  Doing everything I am supposed to and am able to take care of myself.  I don't need any help and don't need any nurse calling me to check in on me") Any questions or concerns?: No  Items Reviewed: Did you receive and understand the discharge instructions provided?: Yes (reviewed minimally with patient-- she declined full review; states she understands what she is supposed to do and will do it) Medications obtained and verified?: Yes (Medications Reviewed) (declined medication review-- stated she has and is taking all post-hospital discharge medications; denies questions/ concerns around medications today; self-manages medications with assistance from her husband) Any new allergies since your discharge?: No Dietary orders reviewed?: Yes Type of Diet Ordered:: BRAT Do you have support at home?: Yes People in Home: spouse Name of Support/Comfort Primary Source: reports independent in self-care activities; resides with husband who assists as needed/ granddaughter also assists if needed  Home Care and Equipment/Supplies: [place] Ordered?: NA (home health services were recommended at time of hospital discharge-- patient declinied/ refused; also declined SNF rehabilitation visit) Any new equipment or medical supplies ordered?: No  Functional Questionnaire: Do you need assistance with  bathing/showering or dressing?: No Do you need assistance with meal preparation?: No Do you need assistance with eating?: No Do you have difficulty maintaining continence: No Do you need assistance with getting out of bed/getting out of a chair/moving?: No Do you have difficulty managing or taking your medications?: Yes (husband assists- fills pill box for patient)  Folllow up appointments reviewed: PCP Follow-up appointment confirmed?: Yes (care coordination outreach in real-time with scheduling care guide to successfully schedule hospital follow up PCP appointment 02/02/23) Date of PCP follow-up appointment?: 02/02/23 Follow-up Provider: PCP- covering provider [facility] Follow-up appointment confirmed?: NA (verified no specialist provider appointment recommended from hospital discharge notes from outside hospital) Do you need transportation to your follow-up appointment?: No Do you understand care options if your condition(s) worsen?: Yes-patient verbalized understanding  SDOH Interventions Today    Flowsheet Row Most Recent Value  SDOH Interventions   Food Insecurity Interventions Intervention Not Indicated  Transportation Interventions Intervention Not Indicated  [husband provides transportation]      TOC Interventions Today    Flowsheet Row Most Recent Value  TOC Interventions   TOC Interventions Discussed/Reviewed TOC Interventions Discussed, Arranged PCP follow up within 7 days/Care Guide scheduled  [care coordination outreach in real-time with scheduling care guide to successfully schedule hospital follow up PCP appointment]      Interventions Today    Flowsheet Row Most Recent Value  Chronic Disease   Chronic disease during today's visit Other  [acute pylenephritis in setting of CKD]  General Interventions   General Interventions Discussed/Reviewed General Interventions Discussed, Doctor Visits  [pt. adamantly declined further care coordination outreach  post-TOC call today]  Doctor Visits Discussed/Reviewed Doctor Visits Discussed, PCP  PCP/Specialist Visits Compliance with follow-up visit  Nutrition Interventions   Nutrition Discussed/Reviewed Nutrition Discussed  Pharmacy Interventions   Pharmacy Dicussed/Reviewed Pharmacy  Topics Discussed      Oneta Rack, RN, BSN, CCRN Alumnus RN CM Care Coordination/ Transition of [place] Management [phone]: direct office

## 2023-01-28 ENCOUNTER — Other Ambulatory Visit: Payer: Self-pay | Admitting: Cardiology

## 2023-02-02 ENCOUNTER — Inpatient Hospital Stay: Payer: Medicare Other | Admitting: Nurse Practitioner

## 2023-02-03 ENCOUNTER — Inpatient Hospital Stay: Payer: Medicare Other

## 2023-02-12 ENCOUNTER — Encounter: Payer: Self-pay | Admitting: Family Medicine

## 2023-02-12 ENCOUNTER — Ambulatory Visit (INDEPENDENT_AMBULATORY_CARE_PROVIDER_SITE_OTHER): Payer: Medicare Other | Admitting: Family Medicine

## 2023-02-12 VITALS — BP 130/65 | HR 57 | Temp 98.2°F | Ht 66.0 in | Wt 194.0 lb

## 2023-02-12 DIAGNOSIS — J449 Chronic obstructive pulmonary disease, unspecified: Secondary | ICD-10-CM | POA: Diagnosis not present

## 2023-02-12 DIAGNOSIS — D6489 Other specified anemias: Secondary | ICD-10-CM | POA: Diagnosis not present

## 2023-02-12 DIAGNOSIS — E876 Hypokalemia: Secondary | ICD-10-CM

## 2023-02-12 DIAGNOSIS — N12 Tubulo-interstitial nephritis, not specified as acute or chronic: Secondary | ICD-10-CM

## 2023-02-12 DIAGNOSIS — R609 Edema, unspecified: Secondary | ICD-10-CM

## 2023-02-12 DIAGNOSIS — Z7901 Long term (current) use of anticoagulants: Secondary | ICD-10-CM

## 2023-02-12 DIAGNOSIS — R6889 Other general symptoms and signs: Secondary | ICD-10-CM | POA: Diagnosis not present

## 2023-02-12 NOTE — Progress Notes (Signed)
Acute Office Visit  Subjective:  Patient ID: Courtney Berry, female    DOB: [birth year], 89 y.o.   MRN: [id number]  Chief Complaint  Patient presents with   Hospitalization Follow-up    Ckd, acute pyelonephritis    HPI Patient is in today for follow up after hospitalization for C. Diff and Acute Pyelonephritis  Competed course of vancomycin  Per patient had an infection in her "sides" and states that she didn't know what was going on with her while she was at the hospital.  Currently Denies fever, N/V/D  Denies blood in stool. States that prior was really green  Is able to take all medications.  Denies cramping. States that stomach hurts minimally when she eats.  States that it still burns for her to urinate with every urination. However, clarifies that it burns while she is wiping. Denies rash, but then states that it "breaks out a little bit". Pt attributes this to sitting in the recliner. Has been using healing vaseline on it. States that it helps some, not completely   Follows with Cardiology on Tuesday   COPD  Does not take inhalers. States that she has not taken inhalers in several years.   ROS As per HPI   Objective:  BP 130/65   Pulse (!) 57   Temp 98.2 F (36.8 C)   Ht 5\' 6"  (1.676 m)   Wt 194 lb (88 kg)   SpO2 95%   BMI 31.31 kg/m   Physical Exam Chaperone present: pt declined GU exam.  Constitutional:      General: She is not in acute distress.    Appearance: Normal appearance. She is not ill-appearing, toxic-appearing or diaphoretic.  Cardiovascular:     Rate and Rhythm: Normal rate.     Pulses: Normal pulses.     Heart sounds: Normal heart sounds. No murmur heard.    No gallop.  Pulmonary:     Effort: Pulmonary effort is normal. No respiratory distress.     Breath sounds: Normal breath sounds. No stridor. No wheezing, rhonchi or rales.  Musculoskeletal:     Right lower leg: 1+ Edema present.     Left lower leg: 1+ Edema present.  Skin:     General: Skin is warm.     Capillary Refill: Capillary refill takes less than 2 seconds.  Neurological:     General: No focal deficit present.     Mental Status: She is alert and oriented to person, place, and time. Mental status is at baseline.     Motor: No weakness.  Psychiatric:        Mood and Affect: Mood normal.        Behavior: Behavior normal.        Thought Content: Thought content normal.        Judgment: Judgment normal.   Assessment & Plan:  1. Hypokalemia Labs as below. Will communicate results to patient once available.  - CMP14+EGFR  2. Anemia due to other cause, not classified Labs as below. Will communicate results to patient once available.  - Anemia Profile B  3. Pyelonephritis Labs as below. Will communicate results to patient once available.  Pt states that she may not be able to complete test today. May have to have daughter drop it off. - Urinalysis, Routine w reflex microscopic - Urine Culture  4. Chronic anticoagulation Followed by Cardiology. Has appointment on Tuesday.   5. Chronic obstructive pulmonary disease, unspecified COPD type ([place]) Is not following with  Pulmonology. Symptoms are well controlled. Patient and husband unable to state what medications patient is currently taking for COPD. Requested patient and husband to bring in medications at next visit.   6. Peripheral edema Improving significantly. Due to renal function, Lasix is not recommended on a regular basis. Pt states that edema worsened in hospital due to fluids. Will continue to monitor.   Requested close follow up   The above assessment and management plan was discussed with the patient. The patient verbalized understanding of and has agreed to the management plan using shared-decision making. Patient is aware to call the clinic if they develop any new symptoms or if symptoms fail to improve or worsen. Patient is aware when to return to the clinic for a follow-up visit. Patient  educated on when it is appropriate to go to the emergency department.    Donzetta Kohut, DNP-FNP [facility] [address] [place], [place] [address] [phone]

## 2023-02-12 NOTE — Patient Instructions (Signed)
Lotrimin, Desitin

## 2023-02-13 LAB — ANEMIA PROFILE B
Basophils Absolute: 0.1 10*3/uL (ref 0.0–0.2)
Basos: 1 %
EOS (ABSOLUTE): 0.4 10*3/uL (ref 0.0–0.4)
Eos: 6 %
Ferritin: 55 ng/mL (ref 15–150)
Folate: 8.7 ng/mL (ref >3.0)
Hematocrit: 35 % (ref 34.0–46.6)
Hemoglobin: 11.2 g/dL (ref 11.1–15.9)
Immature Grans (Abs): 0 10*3/uL (ref 0.0–0.1)
Immature Granulocytes: 0 %
Iron Saturation: 30 % (ref 15–55)
Iron: 79 ug/dL (ref 27–139)
Lymphocytes Absolute: 2.4 10*3/uL (ref 0.7–3.1)
Lymphs: 42 %
MCH: 31.3 pg (ref 26.6–33.0)
MCHC: 32 g/dL (ref 31.5–35.7)
MCV: 98 fL — ABNORMAL HIGH (ref 79–97)
Monocytes Absolute: 0.7 10*3/uL (ref 0.1–0.9)
Monocytes: 12 %
Neutrophils Absolute: 2.2 10*3/uL (ref 1.4–7.0)
Neutrophils: 39 %
Platelets: 184 10*3/uL (ref 150–450)
RBC: 3.58 x10E6/uL — ABNORMAL LOW (ref 3.77–5.28)
RDW: 11.4 % — ABNORMAL LOW (ref 11.7–15.4)
Retic Ct Pct: 1.1 % (ref 0.6–2.6)
Total Iron Binding Capacity: 260 ug/dL (ref 250–450)
UIBC: 181 ug/dL (ref 118–369)
Vitamin B-12: 344 pg/mL (ref 232–1245)
WBC: 5.7 10*3/uL (ref 3.4–10.8)

## 2023-02-13 LAB — CMP14+EGFR
ALT: 9 IU/L (ref 0–32)
AST: 20 IU/L (ref 0–40)
Albumin/Globulin Ratio: 0.9 — ABNORMAL LOW (ref 1.2–2.2)
Albumin: 3.2 g/dL — ABNORMAL LOW (ref 3.7–4.7)
Alkaline Phosphatase: 134 IU/L — ABNORMAL HIGH (ref 44–121)
BUN/Creatinine Ratio: 11 — ABNORMAL LOW (ref 12–28)
BUN: 17 mg/dL (ref 8–27)
Bilirubin Total: 0.3 mg/dL (ref 0.0–1.2)
CO2: 25 mmol/L (ref 20–29)
Calcium: 8.7 mg/dL (ref 8.7–10.3)
Chloride: 104 mmol/L (ref 96–106)
Creatinine, Ser: 1.48 mg/dL — ABNORMAL HIGH (ref 0.57–1.00)
Globulin, Total: 3.7 g/dL (ref 1.5–4.5)
Glucose: 107 mg/dL — ABNORMAL HIGH (ref 70–99)
Potassium: 5.2 mmol/L (ref 3.5–5.2)
Sodium: 141 mmol/L (ref 134–144)
Total Protein: 6.9 g/dL (ref 6.0–8.5)
eGFR: 34 mL/min/{1.73_m2} — ABNORMAL LOW (ref >59)

## 2023-02-17 ENCOUNTER — Ambulatory Visit: Payer: Medicare Other | Attending: Nurse Practitioner | Admitting: Nurse Practitioner

## 2023-02-17 ENCOUNTER — Encounter: Payer: Self-pay | Admitting: Nurse Practitioner

## 2023-02-17 VITALS — BP 128/64 | HR 60 | Ht 66.0 in | Wt 192.6 lb

## 2023-02-17 DIAGNOSIS — Z7901 Long term (current) use of anticoagulants: Secondary | ICD-10-CM

## 2023-02-17 DIAGNOSIS — I10 Essential (primary) hypertension: Secondary | ICD-10-CM

## 2023-02-17 DIAGNOSIS — N1832 Chronic kidney disease, stage 3b: Secondary | ICD-10-CM | POA: Diagnosis not present

## 2023-02-17 DIAGNOSIS — I480 Paroxysmal atrial fibrillation: Secondary | ICD-10-CM | POA: Diagnosis not present

## 2023-02-17 DIAGNOSIS — I890 Lymphedema, not elsewhere classified: Secondary | ICD-10-CM

## 2023-02-17 NOTE — Progress Notes (Unsigned)
Office Visit    Patient Name: Courtney Berry Date of Encounter: 02/17/2023  PCP:  [facility] Ochoa, [place]  Cardiologist:  Rozann Lesches, MD *** Advanced Practice Provider:  No care team member to display Electrophysiologist:  None  {Press F2 to show EP APP, CHF, sleep or structural heart MD               :[id number]  { Click here to update then REFRESH NOTE - MD (PCP) or APP (Team Member)  Change PCP Type for MD, Specialty for APP is either Cardiology or Clinical Cardiac Electrophysiology  :[id number]  Chief Complaint    Courtney Berry is a 89 y.o. female with a history of persistent atrial fibrillation, hypertension, mixed HLD, lymphedema, CKD stage IIIb, hypertension, rheumatoid arthritis, and COPD, who presents today for 37-month follow-up.  Past Medical History    Past Medical History:  Diagnosis Date   CKD (chronic kidney disease) stage 3, GFR 30-59 ml/min (HCC)    COPD (chronic obstructive pulmonary disease) (HCC)    Diverticulosis    Essential hypertension    History of chest pain    Low risk Cardiolite 2010   Lymphedema    Mixed hyperlipidemia    PAF (paroxysmal atrial fibrillation) (HCC)    Rheumatoid arthritis ([place])    Past Surgical History:  Procedure Laterality Date   TOTAL ABDOMINAL HYSTERECTOMY  11/18/1967    Allergies  Allergies  Allergen Reactions   Clopidogrel Bisulfate Swelling    Face swells    Darvocet [Propoxyphene N-Acetaminophen] Other (See Comments)    unknown   Doxycycline Itching   Lasix [Furosemide]    Nitrofurantoin Monohyd Macro Other (See Comments)    unknown   Plavix [Clopidogrel Bisulfate] Swelling    Face swells   Sulfa Antibiotics Other (See Comments)    "face got puffy"   Amitriptyline Other (See Comments)    Made her "talk out of her head" and sleep until lunch the next day.   Gabapentin Other (See Comments)    Made her "talk out of her head".   Omnicef [Cefdinir] Nausea  And Vomiting and Rash   Penicillins Rash    Has patient had a PCN reaction causing immediate rash, facial/tongue/throat swelling, SOB or lightheadedness with hypotension: Yes Has patient had a PCN reaction causing severe rash involving mucus membranes or skin necrosis: No Has patient had a PCN reaction that required hospitalization: No Has patient had a PCN reaction occurring within the last 10 years: No If all of the above answers are "NO", then may proceed with Cephalosporin use.     History of Present Illness    Courtney Berry is a 89 y.o. female with a PMH as mentioned above.  Last seen by Dr. Domenic Polite on August 18, 2022.  She was overall doing well from a cardiac perspective.  Was not currently at that time on a diuretic due to having renal insufficiency.  Addition of diuretic was felt that would not tremendously impact her lymphedema.  Dr. Domenic Polite recommended that in future could consider PT referral/mechanical compression.  In the interim, was admitted last month for what was felt to be acute pyelonephritis, tested positive for C. difficile.  Was recommended for SNF placement, patient declined.  Today she presents for regular follow-up.  She states    The following studies were reviewed today: ***  EKG:  EKG is *** ordered today.  The ekg ordered today demonstrates ***  Recent  Labs: 05/27/2022: TSH 1.870 01/06/2023: BNP 56.4 02/12/2023: ALT 9; BUN 17; Creatinine, Ser 1.48; Hemoglobin 11.2; Platelets 184; Potassium 5.2; Sodium 141  Recent Lipid Panel    Component Value Date/Time   CHOL 141 08/29/2020 0835   TRIG 118 08/29/2020 0835   HDL 50 08/29/2020 0835   CHOLHDL 2.8 08/29/2020 0835   CHOLHDL 3.7 08/02/2018 0417   VLDL 38 08/02/2018 0417   LDLCALC 70 08/29/2020 0835    Risk Assessment/Calculations:  {Does this patient have ATRIAL FIBRILLATION?[phone]}  Home Medications   No outpatient medications have been marked as taking for the 02/17/23 encounter  (Appointment) with Finis Bud, NP.     Review of Systems   ***   All other systems reviewed and are otherwise negative except as noted above.  Physical Exam    VS:  There were no vitals taken for this visit. , BMI There is no height or weight on file to calculate BMI.  Wt Readings from Last 3 Encounters:  02/12/23 194 lb (88 kg)  01/06/23 202 lb (91.6 kg)  08/18/22 197 lb 6.4 oz (89.5 kg)     GEN: Well nourished, well developed, in no acute distress. HEENT: normal. Neck: Supple, no JVD, carotid bruits, or masses. Cardiac: ***RRR, no murmurs, rubs, or gallops. No clubbing, cyanosis, edema.  ***Radials/PT 2+ and equal bilaterally.  Respiratory:  ***Respirations regular and unlabored, clear to auscultation bilaterally. GI: Soft, nontender, nondistended. MS: No deformity or atrophy. Skin: Warm and dry, no rash. Neuro:  Strength and sensation are intact. Psych: Normal affect.  Assessment & Plan    ***  {Are you ordering a CV Procedure (e.g. stress test, cath, DCCV, TEE, etc)?   Press F2        :[id number]      Disposition: Follow up {follow up:[id number]} with Rozann Lesches, MD or APP.  Signed, Finis Bud, NP 02/17/2023, 8:29 AM [place]

## 2023-02-17 NOTE — Patient Instructions (Signed)
Medication Instructions:  Your physician has recommended you make the following change in your medication:  STOP lasix Continue all other medications as directed  Labwork: none  Testing/Procedures: none  Follow-Up:  Your physician recommends that you schedule a follow-up appointment in: 6 months  Any Other Special Instructions Will Be Listed Below (If Applicable).  You have been referred to Nephrology   If you need a refill on your cardiac medications before your next appointment, please call your pharmacy.

## 2023-02-20 ENCOUNTER — Other Ambulatory Visit: Payer: Medicare Other

## 2023-02-20 DIAGNOSIS — N12 Tubulo-interstitial nephritis, not specified as acute or chronic: Secondary | ICD-10-CM | POA: Diagnosis not present

## 2023-02-20 LAB — URINALYSIS, ROUTINE W REFLEX MICROSCOPIC
Bilirubin, UA: NEGATIVE
Glucose, UA: NEGATIVE
Ketones, UA: NEGATIVE
Leukocytes,UA: NEGATIVE
Nitrite, UA: NEGATIVE
Protein,UA: NEGATIVE
RBC, UA: NEGATIVE
Specific Gravity, UA: 1.02 (ref 1.005–1.030)
Urobilinogen, Ur: 0.2 mg/dL (ref 0.2–1.0)
pH, UA: 5 (ref 5.0–7.5)

## 2023-02-24 LAB — URINE CULTURE

## 2023-03-12 ENCOUNTER — Ambulatory Visit: Payer: Medicare Other | Admitting: Family Medicine

## 2023-03-13 ENCOUNTER — Ambulatory Visit (INDEPENDENT_AMBULATORY_CARE_PROVIDER_SITE_OTHER): Payer: Medicare Other

## 2023-03-13 VITALS — Ht 66.0 in | Wt 190.0 lb

## 2023-03-13 DIAGNOSIS — Z0000 Encounter for general adult medical examination without abnormal findings: Secondary | ICD-10-CM

## 2023-03-13 NOTE — Patient Instructions (Signed)
Courtney Berry , Thank you for taking time to come for your Medicare Wellness Visit. I appreciate your ongoing commitment to your health goals. Please review the following plan we discussed and let me know if I can assist you in the future.   These are the goals we discussed:  Goals      DIET - INCREASE WATER INTAKE     Weight (lb) < 200 lb (90.7 kg)     Reduce breads and sweets.        This is a list of the screening recommended for you and due dates:  Health Maintenance  Topic Date Due   COVID-19 Vaccine (1) Never done   Zoster (Shingles) Vaccine (1 of 2) 04/06/2023*   Pneumonia Vaccine (1 of 1 - PCV) 05/28/2023*   DEXA scan (bone density measurement)  05/28/2023*   Flu Shot  06/18/2023   Medicare Annual Wellness Visit  03/12/2024   HPV Vaccine  Aged Out   DTaP/Tdap/Td vaccine  Discontinued  *Topic was postponed. The date shown is not the original due date.    Advanced directives: Please bring a copy of your health care power of attorney and living will to the office to be added to your chart at your convenience.   Conditions/risks identified: Aim for 30 minutes of exercise or brisk walking, 6-8 glasses of water, and 5 servings of fruits and vegetables each day.   Next appointment: Follow up in one year for your annual wellness visit    Preventive Care 65 Years and Older, Female Preventive care refers to lifestyle choices and visits with your health care provider that can promote health and wellness. What does preventive care include? A yearly physical exam. This is also called an annual well check. Dental exams once or twice a year. Routine eye exams. Ask your health care provider how often you should have your eyes checked. Personal lifestyle choices, including: Daily care of your teeth and gums. Regular physical activity. Eating a healthy diet. Avoiding tobacco and drug use. Limiting alcohol use. Practicing safe sex. Taking low-dose aspirin every day. Taking vitamin  and mineral supplements as recommended by your health care provider. What happens during an annual well check? The services and screenings done by your health care provider during your annual well check will depend on your age, overall health, lifestyle risk factors, and family history of disease. Counseling  Your health care provider may ask you questions about your: Alcohol use. Tobacco use. Drug use. Emotional well-being. Home and relationship well-being. Sexual activity. Eating habits. History of falls. Memory and ability to understand (cognition). Work and work Astronomer. Reproductive health. Screening  You may have the following tests or measurements: Height, weight, and BMI. Blood pressure. Lipid and cholesterol levels. These may be checked every 5 years, or more frequently if you are over 71 years old. Skin check. Lung cancer screening. You may have this screening every year starting at age 64 if you have a 30-pack-year history of smoking and currently smoke or have quit within the past 15 years. Fecal occult blood test (FOBT) of the stool. You may have this test every year starting at age 42. Flexible sigmoidoscopy or colonoscopy. You may have a sigmoidoscopy every 5 years or a colonoscopy every 10 years starting at age 73. Hepatitis C blood test. Hepatitis B blood test. Sexually transmitted disease (STD) testing. Diabetes screening. This is done by checking your blood sugar (glucose) after you have not eaten for a while (fasting). You may have  this done every 1-3 years. Bone density scan. This is done to screen for osteoporosis. You may have this done starting at age 47. Mammogram. This may be done every 1-2 years. Talk to your health care provider about how often you should have regular mammograms. Talk with your health care provider about your test results, treatment options, and if necessary, the need for more tests. Vaccines  Your health care provider may recommend  certain vaccines, such as: Influenza vaccine. This is recommended every year. Tetanus, diphtheria, and acellular pertussis (Tdap, Td) vaccine. You may need a Td booster every 10 years. Zoster vaccine. You may need this after age 70. Pneumococcal 13-valent conjugate (PCV13) vaccine. One dose is recommended after age 8. Pneumococcal polysaccharide (PPSV23) vaccine. One dose is recommended after age 67. Talk to your health care provider about which screenings and vaccines you need and how often you need them. This information is not intended to replace advice given to you by your health care provider. Make sure you discuss any questions you have with your health care provider. Document Released: 11/30/2015 Document Revised: 07/23/2016 Document Reviewed: 09/04/2015 Elsevier Interactive Patient Education  2017 [place] Prevention in the Home Falls can cause injuries. They can happen to people of all ages. There are many things you can do to make your home safe and to help prevent falls. What can I do on the outside of my home? Regularly fix the edges of walkways and driveways and fix any cracks. Remove anything that might make you trip as you walk through a door, such as a raised step or threshold. Trim any bushes or trees on the path to your home. Use bright outdoor lighting. Clear any walking paths of anything that might make someone trip, such as rocks or tools. Regularly check to see if handrails are loose or broken. Make sure that both sides of any steps have handrails. Any raised decks and porches should have guardrails on the edges. Have any leaves, snow, or ice cleared regularly. Use sand or salt on walking paths during winter. Clean up any spills in your garage right away. This includes oil or grease spills. What can I do in the bathroom? Use night lights. Install grab bars by the toilet and in the tub and shower. Do not use towel bars as grab bars. Use non-skid mats or  decals in the tub or shower. If you need to sit down in the shower, use a plastic, non-slip stool. Keep the floor dry. Clean up any water that spills on the floor as soon as it happens. Remove soap buildup in the tub or shower regularly. Attach bath mats securely with double-sided non-slip rug tape. Do not have throw rugs and other things on the floor that can make you trip. What can I do in the bedroom? Use night lights. Make sure that you have a light by your bed that is easy to reach. Do not use any sheets or blankets that are too big for your bed. They should not hang down onto the floor. Have a firm chair that has side arms. You can use this for support while you get dressed. Do not have throw rugs and other things on the floor that can make you trip. What can I do in the kitchen? Clean up any spills right away. Avoid walking on wet floors. Keep items that you use a lot in easy-to-reach places. If you need to reach something above you, use a strong step stool  that has a grab bar. Keep electrical cords out of the way. Do not use floor polish or wax that makes floors slippery. If you must use wax, use non-skid floor wax. Do not have throw rugs and other things on the floor that can make you trip. What can I do with my stairs? Do not leave any items on the stairs. Make sure that there are handrails on both sides of the stairs and use them. Fix handrails that are broken or loose. Make sure that handrails are as long as the stairways. Check any carpeting to make sure that it is firmly attached to the stairs. Fix any carpet that is loose or worn. Avoid having throw rugs at the top or bottom of the stairs. If you do have throw rugs, attach them to the floor with carpet tape. Make sure that you have a light switch at the top of the stairs and the bottom of the stairs. If you do not have them, ask someone to add them for you. What else can I do to help prevent falls? Wear shoes that: Do not  have high heels. Have rubber bottoms. Are comfortable and fit you well. Are closed at the toe. Do not wear sandals. If you use a stepladder: Make sure that it is fully opened. Do not climb a closed stepladder. Make sure that both sides of the stepladder are locked into place. Ask someone to hold it for you, if possible. Clearly mark and make sure that you can see: Any grab bars or handrails. First and last steps. Where the edge of each step is. Use tools that help you move around (mobility aids) if they are needed. These include: Canes. Walkers. Scooters. Crutches. Turn on the lights when you go into a dark area. Replace any light bulbs as soon as they burn out. Set up your furniture so you have a clear path. Avoid moving your furniture around. If any of your floors are uneven, fix them. If there are any pets around you, be aware of where they are. Review your medicines with your doctor. Some medicines can make you feel dizzy. This can increase your chance of falling. Ask your doctor what other things that you can do to help prevent falls. This information is not intended to replace advice given to you by your health care provider. Make sure you discuss any questions you have with your health care provider. Document Released: 08/30/2009 Document Revised: 04/10/2016 Document Reviewed: 12/08/2014 Elsevier Interactive Patient Education  2017 [employer].

## 2023-03-13 NOTE — Progress Notes (Signed)
Subjective:   Courtney Berry is a 89 y.o. female who presents for Medicare Annual (Subsequent) preventive examination. I connected with  Courtney Berry on 03/13/23 by a audio enabled telemedicine application and verified that I am speaking with the correct person using two identifiers.  Patient Location: Home  Provider Location: Home Office  I discussed the limitations of evaluation and management by telemedicine. The patient expressed understanding and agreed to proceed.  Review of Systems     Cardiac Risk Factors include: advanced age (>61men, >68 women);hypertension;dyslipidemia     Objective:    Today's Vitals   03/13/23 1115  Weight: 190 lb (86.2 kg)  Height: 5\' 6"  (1.676 m)   Body mass index is 30.67 kg/m.     03/13/2023   11:18 AM 03/11/2022   11:42 AM 03/07/2021   10:14 AM 03/17/2020    3:13 PM 10/26/2019   12:45 PM 10/18/2019   10:54 AM 08/01/2018    2:50 PM  Advanced Directives  Does Patient Have a Medical Advance Directive? Yes Yes Yes No Yes No No  Type of Estate agent of [place];Living will Living will;Healthcare Power of [employer] Power of [employer] Power of Attorney    Does patient want to make changes to medical advance directive?       No - Patient declined  Copy of Healthcare Power of Attorney in Chart? No - copy requested No - copy requested Yes - validated most recent copy scanned in chart (See row information)  No - copy requested    Would patient like information on creating a medical advance directive?    No - Patient declined   No - Patient declined    Current Medications (verified) Outpatient Encounter Medications as of 03/13/2023  Medication Sig   acetaminophen (TYLENOL) 500 MG tablet Take 1,000 mg by mouth every 6 (six) hours as needed for moderate pain.   albuterol (PROVENTIL) (2.5 MG/3ML) 0.083% nebulizer solution    amiodarone (PACERONE) 200 MG tablet TAKE 1/2 TABLET BY MOUTH DAILY   amLODipine  (NORVASC) 5 MG tablet Take 1 tablet (5 mg total) by mouth daily.   apixaban (ELIQUIS) 2.5 MG TABS tablet TAKE 1 TABLET BY MOUTH TWICE A DAY   lisinopril (ZESTRIL) 20 MG tablet TAKE 1 TABLET BY MOUTH EVERY DAY   metoprolol tartrate (LOPRESSOR) 25 MG tablet TAKE 0.5 TABLETS BY MOUTH 2 TIMES DAILY.   PROAIR HFA 108 (90 Base) MCG/ACT inhaler Inhale 1-2 puffs into the lungs as needed for wheezing or shortness of breath.   Probiotic Product (PROBIOTIC DAILY PO) Take 1 tablet by mouth daily.   umeclidinium-vilanterol (ANORO ELLIPTA) 62.5-25 MCG/INH AEPB Inhale 1 puff into the lungs daily.   vancomycin (VANCOCIN) 125 MG capsule Take 125 mg by mouth 4 (four) times daily. (Patient not taking: Reported on 03/13/2023)   No facility-administered encounter medications on file as of 03/13/2023.    Allergies (verified) Clopidogrel bisulfate, Darvocet [propoxyphene n-acetaminophen], Doxycycline, Lasix [furosemide], Nitrofurantoin monohyd macro, Plavix [clopidogrel bisulfate], Sulfa antibiotics, Amitriptyline, Gabapentin, Omnicef [cefdinir], and Penicillins   History: Past Medical History:  Diagnosis Date   CKD (chronic kidney disease) stage 3, GFR 30-59 ml/min (HCC)    COPD (chronic obstructive pulmonary disease) (HCC)    Diverticulosis    Essential hypertension    History of chest pain    Low risk Cardiolite 2010   Lymphedema    Mixed hyperlipidemia    PAF (paroxysmal atrial fibrillation) (HCC)    Rheumatoid arthritis (  HCC)    Past Surgical History:  Procedure Laterality Date   TOTAL ABDOMINAL HYSTERECTOMY  11/18/1967   Family History  Problem Relation Age of Onset   Colon cancer Father    Cancer Mother    Heart disease Sister        CABG age 59   Cancer Brother    Social History   Socioeconomic History   Marital status: Married    Spouse name: Not on file   Number of children: 2   Years of education: Not on file   Highest education level: Not on file  Occupational History    Occupation: Retired  Tobacco Use   Smoking status: Never    Passive exposure: Never   Smokeless tobacco: Never   Tobacco comments:    Tobacco use-no  Vaping Use   Vaping Use: Never used  Substance and Sexual Activity   Alcohol use: No    Alcohol/week: 0.0 standard drinks of alcohol   Drug use: No   Sexual activity: Not on file  Other Topics Concern   Not on file  Social History Narrative   Lives with her husband - one level living, handicap accessible home - Son and daughter both live very close by.   Social Determinants of Health   Financial Resource Strain: Low Risk  (03/13/2023)   Overall Financial Resource Strain (CARDIA)    Difficulty of Paying Living Expenses: Not hard at all  Food Insecurity: No Food Insecurity (03/13/2023)   Hunger Vital Sign    Worried About Running Out of Food in the Last Year: Never true    Ran Out of Food in the Last Year: Never true  Transportation Needs: No Transportation Needs (03/13/2023)   PRAPARE - Administrator, Civil Service (Medical): No    Lack of Transportation (Non-Medical): No  Physical Activity: Insufficiently Active (03/13/2023)   Exercise Vital Sign    Days of Exercise per Week: 3 days    Minutes of Exercise per Session: 30 min  Stress: No Stress Concern Present (03/13/2023)   [employer] of Occupational Health - Occupational Stress Questionnaire    Feeling of Stress : Not at all  Social Connections: Moderately Integrated (03/13/2023)   Social Connection and Isolation Panel [NHANES]    Frequency of Communication with Friends and Family: More than three times a week    Frequency of Social Gatherings with Friends and Family: More than three times a week    Attends Religious Services: More than 4 times per year    Active Member of [employer] or Organizations: No    Attends Engineer, structural: Never    Marital Status: Married    Tobacco Counseling Counseling given: Not Answered Tobacco comments: Tobacco  use-no   Clinical Intake:  Pre-visit preparation completed: Yes  Pain : No/denies pain     Nutritional Risks: None Diabetes: No  How often do you need to have someone help you when you read instructions, pamphlets, or other written materials from your doctor or pharmacy?: 1 - Never  Diabetic?no   Interpreter Needed?: No  Information entered by :: Renie Ora, LPN   Activities of Daily Living    03/13/2023   11:19 AM  In your present state of health, do you have any difficulty performing the following activities:  Hearing? 0  Vision? 0  Difficulty concentrating or making decisions? 0  Walking or climbing stairs? 0  Dressing or bathing? 0  Doing errands, shopping? 0  Preparing Food  and eating ? N  Using the Toilet? N  In the past six months, have you accidently leaked urine? N  Do you have problems with loss of bowel control? N  Managing your Medications? N  Managing your Finances? N  Housekeeping or managing your Housekeeping? N    Patient Care Team: Milian, Aleen Campi, FNP as PCP - General (Family Medicine) Jonelle Sidle, MD as PCP - Cardiology (Cardiology) Kari Baars, MD as Consulting Physician (Pulmonary Disease) Corbin Ade, MD as Consulting Physician (Gastroenterology) Sharlene Dory, NP as Nurse Practitioner (Cardiology)  Indicate any recent Medical Services you may have received from other than [facility] providers in the past year (date may be approximate).     Assessment:   This is a routine wellness examination for [place].  Hearing/Vision screen Vision Screening - Comments:: Wears rx glasses - up to date with routine eye exams with  Dr.Davis   Dietary issues and exercise activities discussed: Current Exercise Habits: Home exercise routine, Type of exercise: walking, Time (Minutes): 30, Frequency (Times/Week): 3, Weekly Exercise (Minutes/Week): 90, Intensity: Mild, Exercise limited by: None identified   Goals Addressed              This Visit's Progress    DIET - INCREASE WATER INTAKE         Depression Screen    03/13/2023   11:18 AM 02/12/2023    3:19 PM 01/12/2023    2:06 PM 01/09/2023   10:41 AM 01/06/2023    1:13 PM 05/27/2022    2:43 PM 03/11/2022   11:29 AM  PHQ 2/9 Scores  PHQ - 2 Score 0 0 0 0 0 0 0  PHQ- 9 Score 0 2 1 1 1 6      Fall Risk    03/13/2023   11:16 AM 02/12/2023    3:20 PM 01/12/2023    2:06 PM 01/09/2023   10:41 AM 01/06/2023    1:13 PM  Fall Risk   Falls in the past year? 0 0 0 0 0  Number falls in past yr: 0 0   0  Injury with Fall? 0 0   0  Risk for fall due to : No Fall Risks No Fall Risks   No Fall Risks  Follow up Falls prevention discussed Falls evaluation completed Falls evaluation completed  Falls evaluation completed    FALL RISK PREVENTION PERTAINING TO THE HOME:  Any stairs in or around the home? No  If so, are there any without handrails? No  Home free of loose throw rugs in walkways, pet beds, electrical cords, etc? Yes  Adequate lighting in your home to reduce risk of falls? Yes   ASSISTIVE DEVICES UTILIZED TO PREVENT FALLS:  Life alert? No  Use of a cane, walker or w/c? Yes  Grab bars in the bathroom? Yes  Shower chair or bench in shower? Yes  Elevated toilet seat or a handicapped toilet? Yes       03/10/2018   11:37 AM  MMSE - Mini Mental State Exam  Orientation to time 5  Orientation to Place 4  Registration 3  Attention/ Calculation 3  Recall 2  Language- name 2 objects 2  Language- repeat 0  Language- follow 3 step command 3  Language- read & follow direction 1  Write a sentence 1  Copy design 1  Total score 25        03/13/2023   11:19 AM 03/11/2022   11:30 AM  6CIT Screen  What  Year? 0 points 0 points  What month? 0 points 0 points  What time? 0 points 0 points  Count back from 20 0 points 0 points  Months in reverse 0 points 0 points  Repeat phrase 0 points 8 points  Total Score 0 points 8 points    Immunizations  There is  no immunization history on file for this patient.  TDAP status: Due, Education has been provided regarding the importance of this vaccine. Advised may receive this vaccine at local pharmacy or Health Dept. Aware to provide a copy of the vaccination record if obtained from local pharmacy or Health Dept. Verbalized acceptance and understanding.  Flu Vaccine status: Declined, Education has been provided regarding the importance of this vaccine but patient still declined. Advised may receive this vaccine at local pharmacy or Health Dept. Aware to provide a copy of the vaccination record if obtained from local pharmacy or Health Dept. Verbalized acceptance and understanding.  Pneumococcal vaccine status: Declined,  Education has been provided regarding the importance of this vaccine but patient still declined. Advised may receive this vaccine at local pharmacy or Health Dept. Aware to provide a copy of the vaccination record if obtained from local pharmacy or Health Dept. Verbalized acceptance and understanding.   Covid-19 vaccine status: Declined, Education has been provided regarding the importance of this vaccine but patient still declined. Advised may receive this vaccine at local pharmacy or Health Dept.or vaccine clinic. Aware to provide a copy of the vaccination record if obtained from local pharmacy or Health Dept. Verbalized acceptance and understanding.  Qualifies for Shingles Vaccine? Yes   Zostavax completed No   Shingrix Completed?: No.    Education has been provided regarding the importance of this vaccine. Patient has been advised to call insurance company to determine out of pocket expense if they have not yet received this vaccine. Advised may also receive vaccine at local pharmacy or Health Dept. Verbalized acceptance and understanding.  Screening Tests Health Maintenance  Topic Date Due   COVID-19 Vaccine (1) Never done   Zoster Vaccines- Shingrix (1 of 2) 04/06/2023 (Originally  01/16/1952)   Pneumonia Vaccine 47+ Years old (1 of 1 - PCV) 05/28/2023 (Originally 01/17/2000)   DEXA SCAN  05/28/2023 (Originally 01/17/2000)   INFLUENZA VACCINE  06/18/2023   Medicare Annual Wellness (AWV)  03/12/2024   HPV VACCINES  Aged Out   DTaP/Tdap/Td  Discontinued    Health Maintenance  Health Maintenance Due  Topic Date Due   COVID-19 Vaccine (1) Never done    Colorectal cancer screening: No longer required.   Mammogram status: No longer required due to age .  Bone Density status: Ordered declined . Pt provided with contact info and advised to call to schedule appt.  Lung Cancer Screening: (Low Dose CT Chest recommended if Age 96-80 years, 30 pack-year currently smoking OR have quit w/in 15years.) does not qualify.   Lung Cancer Screening Referral: n/a  Additional Screening:  Hepatitis C Screening: does not qualify;  Vision Screening: Recommended annual ophthalmology exams for early detection of glaucoma and other disorders of the eye. Is the patient up to date with their annual eye exam?  Yes  Who is the provider or what is the name of the office in which the patient attends annual eye exams? Dr.Davis  If pt is not established with a provider, would they like to be referred to a provider to establish care? No .   Dental Screening: Recommended annual dental exams for proper oral  hygiene  Community Resource Referral / Chronic Care Management: CRR required this visit?  No   CCM required this visit?  No      Plan:     I have personally reviewed and noted the following in the patient's chart:   Medical and social history Use of alcohol, tobacco or illicit drugs  Current medications and supplements including opioid prescriptions. Patient is not currently taking opioid prescriptions. Functional ability and status Nutritional status Physical activity Advanced directives List of other physicians Hospitalizations, surgeries, and ER visits in previous 12  months Vitals Screenings to include cognitive, depression, and falls Referrals and appointments  In addition, I have reviewed and discussed with patient certain preventive protocols, quality metrics, and best practice recommendations. A written personalized care plan for preventive services as well as general preventive health recommendations were provided to patient.     Lorrene Reid, LPN   1/61/0960   Nurse Notes: No vaccines on file patient declines all vaccines

## 2023-03-16 DIAGNOSIS — I129 Hypertensive chronic kidney disease with stage 1 through stage 4 chronic kidney disease, or unspecified chronic kidney disease: Secondary | ICD-10-CM | POA: Diagnosis not present

## 2023-03-16 DIAGNOSIS — N1832 Chronic kidney disease, stage 3b: Secondary | ICD-10-CM | POA: Diagnosis not present

## 2023-03-16 DIAGNOSIS — D638 Anemia in other chronic diseases classified elsewhere: Secondary | ICD-10-CM | POA: Diagnosis not present

## 2023-03-24 DIAGNOSIS — N1832 Chronic kidney disease, stage 3b: Secondary | ICD-10-CM | POA: Diagnosis not present

## 2023-03-24 DIAGNOSIS — D638 Anemia in other chronic diseases classified elsewhere: Secondary | ICD-10-CM | POA: Diagnosis not present

## 2023-03-24 DIAGNOSIS — I129 Hypertensive chronic kidney disease with stage 1 through stage 4 chronic kidney disease, or unspecified chronic kidney disease: Secondary | ICD-10-CM | POA: Diagnosis not present

## 2023-04-11 ENCOUNTER — Emergency Department (HOSPITAL_COMMUNITY): Payer: Medicare Other

## 2023-04-11 ENCOUNTER — Emergency Department (HOSPITAL_COMMUNITY)
Admission: EM | Admit: 2023-04-11 | Discharge: 2023-04-11 | Disposition: A | Discharge status: Home / Self Care | Payer: Medicare Other | Attending: Emergency Medicine | Admitting: Emergency Medicine

## 2023-04-11 ENCOUNTER — Other Ambulatory Visit: Payer: Self-pay

## 2023-04-11 ENCOUNTER — Encounter (HOSPITAL_COMMUNITY): Payer: Self-pay | Admitting: *Deleted

## 2023-04-11 DIAGNOSIS — J40 Bronchitis, not specified as acute or chronic: Secondary | ICD-10-CM | POA: Diagnosis not present

## 2023-04-11 DIAGNOSIS — J449 Chronic obstructive pulmonary disease, unspecified: Secondary | ICD-10-CM | POA: Insufficient documentation

## 2023-04-11 DIAGNOSIS — Z20822 Contact with and (suspected) exposure to covid-19: Secondary | ICD-10-CM | POA: Diagnosis not present

## 2023-04-11 DIAGNOSIS — R059 Cough, unspecified: Secondary | ICD-10-CM | POA: Diagnosis not present

## 2023-04-11 DIAGNOSIS — Z7901 Long term (current) use of anticoagulants: Secondary | ICD-10-CM | POA: Insufficient documentation

## 2023-04-11 LAB — CBC WITH DIFFERENTIAL/PLATELET
Abs Immature Granulocytes: 0.03 10*3/uL (ref 0.00–0.07)
Basophils Absolute: 0.1 10*3/uL (ref 0.0–0.1)
Basophils Relative: 0 %
Eosinophils Absolute: 0.3 10*3/uL (ref 0.0–0.5)
Eosinophils Relative: 2 %
HCT: 36.9 % (ref 36.0–46.0)
Hemoglobin: 11.7 g/dL — ABNORMAL LOW (ref 12.0–15.0)
Immature Granulocytes: 0 %
Lymphocytes Relative: 11 %
Lymphs Abs: 1.5 10*3/uL (ref 0.7–4.0)
MCH: 32.1 pg (ref 26.0–34.0)
MCHC: 31.7 g/dL (ref 30.0–36.0)
MCV: 101.1 fL — ABNORMAL HIGH (ref 80.0–100.0)
Monocytes Absolute: 1.5 10*3/uL — ABNORMAL HIGH (ref 0.1–1.0)
Monocytes Relative: 11 %
Neutro Abs: 10.5 10*3/uL — ABNORMAL HIGH (ref 1.7–7.7)
Neutrophils Relative %: 76 %
Platelets: 201 10*3/uL (ref 150–400)
RBC: 3.65 MIL/uL — ABNORMAL LOW (ref 3.87–5.11)
RDW: 12.7 % (ref 11.5–15.5)
WBC: 14 10*3/uL — ABNORMAL HIGH (ref 4.0–10.5)
nRBC: 0 % (ref 0.0–0.2)

## 2023-04-11 LAB — COMPREHENSIVE METABOLIC PANEL
ALT: 15 U/L (ref 0–44)
AST: 19 U/L (ref 15–41)
Albumin: 2.9 g/dL — ABNORMAL LOW (ref 3.5–5.0)
Alkaline Phosphatase: 126 U/L (ref 38–126)
Anion gap: 10 (ref 5–15)
BUN: 21 mg/dL (ref 8–23)
CO2: 27 mmol/L (ref 22–32)
Calcium: 8.5 mg/dL — ABNORMAL LOW (ref 8.9–10.3)
Chloride: 98 mmol/L (ref 98–111)
Creatinine, Ser: 1.23 mg/dL — ABNORMAL HIGH (ref 0.44–1.00)
GFR, Estimated: 42 mL/min — ABNORMAL LOW (ref >60)
Glucose, Bld: 130 mg/dL — ABNORMAL HIGH (ref 70–99)
Potassium: 4 mmol/L (ref 3.5–5.1)
Sodium: 135 mmol/L (ref 135–145)
Total Bilirubin: 0.8 mg/dL (ref 0.3–1.2)
Total Protein: 7.6 g/dL (ref 6.5–8.1)

## 2023-04-11 LAB — SARS CORONAVIRUS 2 BY RT PCR: SARS Coronavirus 2 by RT PCR: NEGATIVE

## 2023-04-11 MED ORDER — ALBUTEROL SULFATE (2.5 MG/3ML) 0.083% IN NEBU
INHALATION_SOLUTION | RESPIRATORY_TRACT | Status: AC
  Administered 2023-04-11: 2.5 mg
  Filled 2023-04-11: qty 3

## 2023-04-11 MED ORDER — ALBUTEROL SULFATE (2.5 MG/3ML) 0.083% IN NEBU: 2.5000 mg | INHALATION_SOLUTION | RESPIRATORY_TRACT | 2 refills | Status: DC | PRN

## 2023-04-11 MED ORDER — ALBUTEROL SULFATE HFA 108 (90 BASE) MCG/ACT IN AERS
2.0000 | INHALATION_SPRAY | Freq: Once | RESPIRATORY_TRACT | Status: AC
  Administered 2023-04-11: 2 via RESPIRATORY_TRACT
  Filled 2023-04-11: qty 6.7

## 2023-04-11 MED ORDER — PREDNISONE 20 MG PO TABS
20.0000 mg | ORAL_TABLET | Freq: Once | ORAL | Status: AC
  Administered 2023-04-11: 20 mg via ORAL
  Filled 2023-04-11: qty 1

## 2023-04-11 MED ORDER — PREDNISONE 20 MG PO TABS: ORAL_TABLET | ORAL | 0 refills | Status: DC

## 2023-04-11 MED ORDER — IPRATROPIUM-ALBUTEROL 0.5-2.5 (3) MG/3ML IN SOLN
3.0000 mL | Freq: Once | RESPIRATORY_TRACT | Status: AC
  Administered 2023-04-11: 3 mL via RESPIRATORY_TRACT
  Filled 2023-04-11: qty 3

## 2023-04-11 NOTE — ED Triage Notes (Signed)
Pt with dry cough since Wednesday morning, chest sore from coughing. Unknown of fevers.

## 2023-04-12 NOTE — ED Provider Notes (Signed)
[facility]ERGENCY DEPARTMENT AT [facility] Provider Note   CSN: [id number] Arrival date & time: 04/11/23  1837     History  Chief Complaint  Patient presents with   Cough    Courtney Berry is a 89 y.o. female.  Patient complains of a cough and congestion.  Patient reports that she has a history of COPD.  Patient reports being out of her nebulizer solution at home.  The history is provided by the patient and a relative. No language interpreter was used.  Cough Cough characteristics:  Non-productive Sputum characteristics:  Nondescript Severity:  Moderate Timing:  Constant Progression:  Worsening Chronicity:  New Relieved by:  Nothing Worsened by:  Nothing Ineffective treatments:  None tried Associated symptoms: shortness of breath   Associated symptoms: no fever        Home Medications Prior to Admission medications   Medication Sig Start Date End Date Taking? Authorizing Provider  albuterol (PROVENTIL) (2.5 MG/3ML) 0.083% nebulizer solution Take 3 mLs (2.5 mg total) by nebulization every 4 (four) hours as needed for wheezing or shortness of breath. 04/11/23 04/10/24 Yes Elson Areas, PA-C  predniSONE (DELTASONE) 20 MG tablet One tablet a day 04/11/23  Yes Cheron Schaumann K, PA-C  acetaminophen (TYLENOL) 500 MG tablet Take 1,000 mg by mouth every 6 (six) hours as needed for moderate pain.    [provider]  amiodarone (PACERONE) 200 MG tablet TAKE 1/2 TABLET BY MOUTH DAILY 12/25/22   Jonelle Sidle, MD  amLODipine (NORVASC) 5 MG tablet Take 1 tablet (5 mg total) by mouth daily. 08/13/22   Jonelle Sidle, MD  apixaban (ELIQUIS) 2.5 MG TABS tablet TAKE 1 TABLET BY MOUTH TWICE A DAY 11/18/22   Jonelle Sidle, MD  lisinopril (ZESTRIL) 20 MG tablet TAKE 1 TABLET BY MOUTH EVERY DAY 01/28/23   Jonelle Sidle, MD  metoprolol tartrate (LOPRESSOR) 25 MG tablet TAKE 0.5 TABLETS BY MOUTH 2 TIMES DAILY. 11/24/22   Jonelle Sidle, MD  PROAIR HFA 108  [phone] Base) MCG/ACT inhaler Inhale 1-2 puffs into the lungs as needed for wheezing or shortness of breath. 02/26/21   Gwenlyn Fudge, FNP  Probiotic Product (PROBIOTIC DAILY PO) Take 1 tablet by mouth daily.    [provider]  umeclidinium-vilanterol (ANORO ELLIPTA) 62.5-25 MCG/INH AEPB Inhale 1 puff into the lungs daily. 02/26/21   Gwenlyn Fudge, FNP  vancomycin (VANCOCIN) 125 MG capsule Take 125 mg by mouth 4 (four) times daily. Patient not taking: Reported on 03/13/2023 01/26/23   [provider]      Allergies    Clopidogrel bisulfate, Darvocet [propoxyphene n-acetaminophen], Doxycycline, Lasix [furosemide], Nitrofurantoin monohyd macro, Plavix [clopidogrel bisulfate], Sulfa antibiotics, Amitriptyline, Gabapentin, Omnicef [cefdinir], and Penicillins    Review of Systems   Review of Systems  Constitutional:  Negative for fever.  Respiratory:  Positive for cough and shortness of breath.   All other systems reviewed and are negative.   Physical Exam Updated Vital Signs BP (!) 147/54   Pulse 70   Temp 100.2 F (37.9 C) (Oral)   Resp 19   Ht 5\' 6"  (1.676 m)   Wt 85.7 kg   SpO2 92%   BMI 30.51 kg/m  Physical Exam Vitals and nursing note reviewed.  Constitutional:      Appearance: She is well-developed.  HENT:     Head: Normocephalic.     Mouth/Throat:     Mouth: Mucous membranes are moist.  Cardiovascular:  Rate and Rhythm: Normal rate.  Pulmonary:     Effort: Pulmonary effort is normal.  Abdominal:     General: Abdomen is flat. There is no distension.  Musculoskeletal:        General: Normal range of motion.     Cervical back: Normal range of motion.  Skin:    General: Skin is warm.  Neurological:     Mental Status: She is alert and oriented to person, place, and time.     ED Results / Procedures / Treatments   Labs (all labs ordered are listed, but only abnormal results are displayed) Labs Reviewed  CBC WITH DIFFERENTIAL/PLATELET -  Abnormal; Notable for the following components:      Result Value   WBC 14.0 (*)    RBC 3.65 (*)    Hemoglobin 11.7 (*)    MCV 101.1 (*)    Neutro Abs 10.5 (*)    Monocytes Absolute 1.5 (*)    All other components within normal limits  COMPREHENSIVE METABOLIC PANEL - Abnormal; Notable for the following components:   Glucose, Bld 130 (*)    Creatinine, Ser 1.23 (*)    Calcium 8.5 (*)    Albumin 2.9 (*)    GFR, Estimated 42 (*)    All other components within normal limits  SARS CORONAVIRUS 2 BY RT PCR    EKG None  Radiology DG Chest Portable 1 View  Result Date: 04/11/2023 CLINICAL DATA:  Cough EXAM: PORTABLE CHEST 1 VIEW COMPARISON:  01/23/2023 FINDINGS: Heart and mediastinal contours are within normal limits. No focal opacities or effusions. No acute bony abnormality. IMPRESSION: No active disease. Electronically Signed   By: Charlett Nose M.D.   On: 04/11/2023 19:34    Procedures Procedures    Medications Ordered in ED Medications  ipratropium-albuterol (DUONEB) 0.5-2.5 (3) MG/3ML nebulizer solution 3 mL (3 mLs Nebulization Given 04/11/23 2036)  albuterol (PROVENTIL) (2.5 MG/3ML) 0.083% nebulizer solution (2.5 mg  Given 04/11/23 2035)  predniSONE (DELTASONE) tablet 20 mg (20 mg Oral Given 04/11/23 2225)  albuterol (VENTOLIN HFA) 108 (90 Base) MCG/ACT inhaler 2 puff (2 puffs Inhalation Given 04/11/23 2221)    ED Course/ Medical Decision Making/ A&P                             Medical Decision Making Patient complains of a cough and congestion.  Patient has past medical history of COPD  Amount and/or Complexity of Data Reviewed Independent Historian:     Details: Patient here with family member who is supportive External Data Reviewed: notes.    Details: Primary care nephrology notes reviewed Labs: ordered. Decision-making details documented in ED Course.    Details: Abs ordered reviewed and interpreted COVID is negative Radiology: ordered and independent interpretation  performed. Decision-making details documented in ED Course.    Details: X-ray shows no acute cardiopulmonary disease  Risk Prescription drug management. Risk Details: Patient given an DuoNeb here with some relief of symptoms.  Patient states that she can tolerate low doses of prednisone patient is given an albuterol inhaler to take home she is given a dose of prednisone here patient is given a prescription for neb solution as well as 5 days of prednisone.  Patient is advised to follow-up with her primary care physician for recheck           Final Clinical Impression(s) / ED Diagnoses Final diagnoses:  Bronchitis    Rx / DC Orders  ED Discharge Orders          Ordered    albuterol (PROVENTIL) (2.5 MG/3ML) 0.083% nebulizer solution  Every 4 hours PRN        04/11/23 2141    predniSONE (DELTASONE) 20 MG tablet        04/11/23 2141           An After Visit Summary was printed and given to the patient.    Osie Cheeks 04/12/23 1706    Benjiman Core, MD 04/12/23 [phone]

## 2023-04-15 ENCOUNTER — Telehealth: Payer: Self-pay | Admitting: *Deleted

## 2023-04-15 NOTE — Telephone Encounter (Signed)
Transition Care Management Unsuccessful Follow-up Telephone Call  Date of discharge and from where:  [facility] 04/11/2023  Attempts:  1st Attempt  Reason for unsuccessful TCM follow-up call:  Left voice message

## 2023-04-16 ENCOUNTER — Telehealth: Payer: Self-pay | Admitting: *Deleted

## 2023-04-16 NOTE — Telephone Encounter (Signed)
Transition Care Management Unsuccessful Follow-up Telephone Call  Date of discharge and from where:  [facility] 04/11/2023  Attempts:  2nd Attempt  Reason for unsuccessful TCM follow-up call:  No answer/busy

## 2023-04-17 ENCOUNTER — Encounter: Payer: Self-pay | Admitting: Family Medicine

## 2023-04-17 ENCOUNTER — Ambulatory Visit (INDEPENDENT_AMBULATORY_CARE_PROVIDER_SITE_OTHER): Payer: Medicare Other | Admitting: Family Medicine

## 2023-04-17 VITALS — BP 157/67 | HR 66 | Temp 97.6°F | Ht 66.0 in | Wt 195.6 lb

## 2023-04-17 DIAGNOSIS — I10 Essential (primary) hypertension: Secondary | ICD-10-CM | POA: Diagnosis not present

## 2023-04-17 DIAGNOSIS — J441 Chronic obstructive pulmonary disease with (acute) exacerbation: Secondary | ICD-10-CM

## 2023-04-17 MED ORDER — BREZTRI AEROSPHERE 160-9-4.8 MCG/ACT IN AERO
2.0000 | INHALATION_SPRAY | Freq: Two times a day (BID) | RESPIRATORY_TRACT | 11 refills | Status: DC
Start: 2023-04-17 — End: 2023-09-03

## 2023-04-17 MED ORDER — ALBUTEROL SULFATE (2.5 MG/3ML) 0.083% IN NEBU
2.5000 mg | INHALATION_SOLUTION | RESPIRATORY_TRACT | 0 refills | Status: DC | PRN
Start: 2023-04-17 — End: 2023-09-03

## 2023-04-17 NOTE — Progress Notes (Signed)
Acute Office Visit  Subjective:  Patient ID: Courtney Berry, female    DOB: 1933-05-06, 89 y.o.   MRN: [id number]  HPI Patient is in today for ED follow up. She presented to [facility] ED on 04/11/23  and was diagnosed with COPD exacerbation. She states that she believes it started a couple of weeks ago after she was working in the yard, that she was reacting to the pollen and dirt. She was treated with nebulizer and prednisone burst from ED. She has completed prednisone and only has one dose of nebulizer solution left. She has been using nebulizer solution every 4 hours. Reports that she is unable to sleep at night due to the coughing. Reports that she is having a dry cough, she is not producing any sputum at this time. Reports that she feels her energy is improving. She is using inhaler as well with spacer. She is sleeping in the recliner currently. She completed steroid burst. She reports swelling in her right leg. Reports that she completed a breathing treatment before coming.   ROS As per HPI  Objective:  BP (!) 152/62   Pulse 66   Temp 97.6 F (36.4 C)   Ht 5\' 6"  (1.676 m)   Wt 195 lb 9.6 oz (88.7 kg)   SpO2 95%   BMI 31.57 kg/m   Physical Exam Constitutional:      General: She is awake. She is not in acute distress.    Appearance: Normal appearance. She is well-developed and well-groomed. She is not ill-appearing, toxic-appearing or diaphoretic.  Cardiovascular:     Rate and Rhythm: Normal rate.     Pulses: Normal pulses.          Radial pulses are 2+ on the right side and 2+ on the left side.       Posterior tibial pulses are 2+ on the right side and 2+ on the left side.     Heart sounds: Normal heart sounds. No murmur heard.    No gallop.  Pulmonary:     Effort: Pulmonary effort is normal. No respiratory distress.     Breath sounds: No stridor. Examination of the right-middle field reveals wheezing, rhonchi and rales. Examination of the right-lower field reveals  wheezing, rhonchi and rales. Wheezing, rhonchi and rales present.  Musculoskeletal:     Cervical back: Full passive range of motion without pain and neck supple.     Right lower leg: 2+ Edema present.     Left lower leg: No edema.     Comments: Wheelchair bound Right lower leg with edema, non pitting. Patient is wearing knee high compression stockings.    Skin:    General: Skin is warm.     Capillary Refill: Capillary refill takes less than 2 seconds.  Neurological:     General: No focal deficit present.     Mental Status: She is alert, oriented to person, place, and time and easily aroused. Mental status is at baseline.     GCS: GCS eye subscore is 4. GCS verbal subscore is 5. GCS motor subscore is 6.     Motor: No weakness.  Psychiatric:        Attention and Perception: Attention and perception normal.        Mood and Affect: Mood and affect normal.        Speech: Speech normal.        Behavior: Behavior normal. Behavior is cooperative.        Thought Content:  Thought content normal. Thought content does not include homicidal or suicidal ideation. Thought content does not include homicidal or suicidal plan.        Cognition and Memory: Cognition and memory normal.        Judgment: Judgment normal.    Assessment & Plan:  1. Chronic obstructive pulmonary disease with acute exacerbation (HCC) Will provide 2 more days of nebulizer for patient. Instructed patient to utilize either nebulizer or rescue inhaler, but not both. May switch to proventil after nebulizer is completed. Will start Breztri as below and refer to pulmonology. Unsure if patient has been evaluated for COPD in the past by pulmonology.  - Budeson-Glycopyrrol-Formoterol (BREZTRI AEROSPHERE) 160-9-4.8 MCG/ACT AERO; Inhale 2 puffs into the lungs 2 (two) times daily.  Dispense: 10.7 g; Refill: 11 - Ambulatory referral to Pulmonology - albuterol (PROVENTIL) (2.5 MG/3ML) 0.083% nebulizer solution; Take 3 mLs (2.5 mg total) by  nebulization every 4 (four) hours as needed for wheezing or shortness of breath.  Dispense: 15 mL; Refill: 0  2. Primary hypertension Patient BP elevated in office today. States that this is due to completing nebulizer treatment before arriving. Will have patient monitor BP at home and report measurements to determine next steps.   The above assessment and management plan was discussed with the patient. The patient verbalized understanding of and has agreed to the management plan using shared-decision making. Patient is aware to call the clinic if they develop any new symptoms or if symptoms fail to improve or worsen. Patient is aware when to return to the clinic for a follow-up visit. Patient educated on when it is appropriate to go to the emergency department.   Return if symptoms worsen or fail to improve. Instructed patient to follow up in 6 -8 weeks for BP   Neale Burly, DNP-FNP [facility] [address] [place], [place] [address] [phone]

## 2023-04-17 NOTE — Patient Instructions (Addendum)
Follow up if you experience symptoms such as new-onset fever, difficulty breathing, symptoms lasting >3 to 4 weeks, or bloody sputum  Breztri  Chronic obstructive pulmonary disease: Metered-dose inhaler (budesonide 160 mcg/glycopyrrolate 9 mcg/formoterol 4.8 mcg per actuation): Oral inhalation: 2 inhalations twice daily; maximum dose: 2 inhalations twice daily.

## 2023-04-27 DIAGNOSIS — R809 Proteinuria, unspecified: Secondary | ICD-10-CM | POA: Diagnosis not present

## 2023-04-27 DIAGNOSIS — E559 Vitamin D deficiency, unspecified: Secondary | ICD-10-CM | POA: Diagnosis not present

## 2023-04-27 DIAGNOSIS — I129 Hypertensive chronic kidney disease with stage 1 through stage 4 chronic kidney disease, or unspecified chronic kidney disease: Secondary | ICD-10-CM | POA: Diagnosis not present

## 2023-05-17 ENCOUNTER — Other Ambulatory Visit: Payer: Self-pay | Admitting: Cardiology

## 2023-05-17 DIAGNOSIS — I480 Paroxysmal atrial fibrillation: Secondary | ICD-10-CM

## 2023-05-18 NOTE — Telephone Encounter (Signed)
Renal function has improved. Recommend increasing to 5mg  BID

## 2023-05-18 NOTE — Telephone Encounter (Signed)
Prescription refill request for Eliquis received. Indication: AF Last office visit: 02/17/23  Shawnie Dapper NP Scr: 1.23 on 04/11/23  Epic Age: 89 Weight: 87.4kg  Pt is on Eliquis 2.5mg  twice daily.  Based on above findings 5mg  twice daily is the appropriate dose.  Message sent to PharmD to evaluate dose.

## 2023-05-18 NOTE — Telephone Encounter (Signed)
Pt notified we are increasing Eliquis dose to 5mg  twice daily due to improved renal function. New Rx sent to pharmacy.

## 2023-05-29 ENCOUNTER — Ambulatory Visit: Payer: Medicare Other | Admitting: Family Medicine

## 2023-06-04 ENCOUNTER — Ambulatory Visit (INDEPENDENT_AMBULATORY_CARE_PROVIDER_SITE_OTHER): Payer: Medicare Other | Admitting: Family Medicine

## 2023-06-04 ENCOUNTER — Encounter: Payer: Self-pay | Admitting: Family Medicine

## 2023-06-04 VITALS — BP 139/70 | HR 60 | Temp 98.3°F | Ht 66.0 in | Wt 197.0 lb

## 2023-06-04 DIAGNOSIS — Z7901 Long term (current) use of anticoagulants: Secondary | ICD-10-CM

## 2023-06-04 DIAGNOSIS — H9202 Otalgia, left ear: Secondary | ICD-10-CM

## 2023-06-04 DIAGNOSIS — I480 Paroxysmal atrial fibrillation: Secondary | ICD-10-CM | POA: Diagnosis not present

## 2023-06-04 DIAGNOSIS — D6489 Other specified anemias: Secondary | ICD-10-CM | POA: Diagnosis not present

## 2023-06-04 DIAGNOSIS — J449 Chronic obstructive pulmonary disease, unspecified: Secondary | ICD-10-CM | POA: Diagnosis not present

## 2023-06-04 DIAGNOSIS — I10 Essential (primary) hypertension: Secondary | ICD-10-CM

## 2023-06-04 DIAGNOSIS — N1832 Chronic kidney disease, stage 3b: Secondary | ICD-10-CM | POA: Diagnosis not present

## 2023-06-04 DIAGNOSIS — R6889 Other general symptoms and signs: Secondary | ICD-10-CM | POA: Diagnosis not present

## 2023-06-04 DIAGNOSIS — I890 Lymphedema, not elsewhere classified: Secondary | ICD-10-CM

## 2023-06-04 NOTE — Progress Notes (Signed)
Acute Office Visit  Subjective:  Patient ID: Courtney Berry, female    DOB: 11/14/1933, 89 y.o.   MRN: [id number]  Chief Complaint  Patient presents with   Medical Management of Chronic Issues    Blood pressure, COPD follow up   HPI Patient is in today for follow up of chronic conditions  Stage 3b chronic kidney disease (HCC) States that she is following with Nephrology.  Started vitamin D1 supplement.   Chronic obstructive pulmonary disease, unspecified COPD type (HCC) Has appt next week with Dr. Sherene Sires for evaluation.  Continues with hacking cough, when she is talking for long periods of time. Using albuterol once daily. Denies PND. States that she completed   Primary hypertension Managed by Dr. Diona Browner.  States that she is taking BP at home sometimes.  Reports that it is "good" unable to define.   Afib  States that the pharmacy increased her dose to 5 mg daily. States "someone called" to tell her but she did not know who. States she was told to speak with her doctor before starting it.   Left ear pain States left ear is somewhat painful. Sates that she felt something was in it. So she dug in it and fears she hurt her ear.   Lymphedema  States that she has not been wearing compression stockings.  Walks around the house holding onto things States she tries to elevate them.   ROS As per HPI   Objective:  BP 139/70   Pulse 60   Temp 98.3 F (36.8 C)   Ht 5\' 6"  (1.676 m)   Wt 197 lb (89.4 kg)   SpO2 97%   BMI 31.80 kg/m   Physical Exam Constitutional:      General: She is awake. She is not in acute distress.    Appearance: Normal appearance. She is well-developed, well-groomed and overweight. She is ill-appearing (chronically ill appearing). She is not toxic-appearing or diaphoretic.  Cardiovascular:     Rate and Rhythm: Normal rate and regular rhythm.     Pulses: Normal pulses.          Radial pulses are 2+ on the right side and 2+ on the left side.        Posterior tibial pulses are 2+ on the right side and 2+ on the left side.     Heart sounds: Normal heart sounds. No murmur heard.    No gallop.  Pulmonary:     Effort: Pulmonary effort is normal. No respiratory distress.     Breath sounds: Normal breath sounds. No stridor. No wheezing, rhonchi or rales.  Musculoskeletal:     Cervical back: Full passive range of motion without pain and neck supple.     Right lower leg: 3+ Edema present.     Left lower leg: 3+ Edema present.     Comments: Wheelchair   Skin:    General: Skin is warm.     Capillary Refill: Capillary refill takes less than 2 seconds.  Neurological:     General: No focal deficit present.     Mental Status: She is alert, oriented to person, place, and time and easily aroused. Mental status is at baseline.     GCS: GCS eye subscore is 4. GCS verbal subscore is 5. GCS motor subscore is 6.     Motor: No weakness.  Psychiatric:        Attention and Perception: Attention and perception normal.        Mood and  Affect: Mood and affect normal.        Speech: Speech normal.        Behavior: Behavior normal. Behavior is cooperative.        Thought Content: Thought content normal. Thought content does not include homicidal or suicidal ideation. Thought content does not include homicidal or suicidal plan.        Cognition and Memory: Cognition and memory normal.        Judgment: Judgment normal.      06/04/2023    3:13 PM 04/17/2023    2:06 PM 03/13/2023   11:18 AM  Depression screen PHQ 2/9  Decreased Interest 0 0 0  Down, Depressed, Hopeless 0 0 0  PHQ - 2 Score 0 0 0  Altered sleeping 1  0  Tired, decreased energy 1  0  Change in appetite 0  0  Feeling bad or failure about yourself  0  0  Trouble concentrating 0  0  Moving slowly or fidgety/restless 0  0  Suicidal thoughts 0  0  PHQ-9 Score 2  0  Difficult doing work/chores Somewhat difficult  Not difficult at all      06/04/2023    3:14 PM 01/12/2023    2:06 PM  01/06/2023    1:13 PM 05/27/2022    2:43 PM  GAD 7 : Generalized Anxiety Score  Nervous, Anxious, on Edge 0 0 0 0  Control/stop worrying 0 0 0 0  Worry too much - different things 1 0 1 1  Trouble relaxing 0 0 0 1  Restless 0 0 0 0  Easily annoyed or irritable 0 0 0 0  Afraid - awful might happen 0 0 0 0  Total GAD 7 Score 1 0 1 2  Anxiety Difficulty Somewhat difficult  Somewhat difficult Somewhat difficult   Assessment & Plan:  1. Stage 3b chronic kidney disease (HCC) Labs as below. Will communicate results to patient once available. Will await results to determine next steps.  Patient to follow up with Nephrology.  - CMP14+EGFR  2. PAF (paroxysmal atrial fibrillation) (HCC) Managed by Cardiology. Will consult provider to determine correct dose of eliquis for patient. Reiterated to patient to not stop taking eliquis.   3. Chronic obstructive pulmonary disease, unspecified COPD type (HCC) Patient has follow up with Dr. Sherene Sires next week.   4. Primary hypertension Not at goal today. Instructed patient to keep a log and bring measurements to follow up appt with Cardiology.   5. Chronic anticoagulation Managed by Cardiology. Will consult provider to determine correct dose of eliquis for patient. Reiterated to patient to not stop taking eliquis.   6. Lymphedema of both lower extremities Discussed with patient wearing compression stockings, elevation, and ambulating as much as possible. Patient states that she only uses wheelchair when she is coming to appts. States that she walks when she is at home. Patient declined referral to lymphedema clinic. She is not currently on diuretic. Reviewed notes from Cardiology, Sharlene Dory, NP 02/17/2023, does not believe that Lasix will be effective.   7. Anemia due to other cause, not classified Labs as below. Will communicate results to patient once available. Will await results to determine next steps.  - Anemia Profile B  The above assessment and  management plan was discussed with the patient. The patient verbalized understanding of and has agreed to the management plan using shared-decision making. Patient is aware to call the clinic if they develop any new symptoms or if symptoms  fail to improve or worsen. Patient is aware when to return to the clinic for a follow-up visit. Patient educated on when it is appropriate to go to the emergency department.   Return in about 6 months (around 12/05/2023).  Neale Burly, DNP-FNP [facility] [address] [place], [place] [address] [phone]

## 2023-06-05 LAB — CMP14+EGFR
ALT: 9 IU/L (ref 0–32)
AST: 17 IU/L (ref 0–40)
Albumin: 3.4 g/dL — ABNORMAL LOW (ref 3.7–4.7)
Alkaline Phosphatase: 129 IU/L — ABNORMAL HIGH (ref 44–121)
BUN/Creatinine Ratio: 17 (ref 12–28)
BUN: 22 mg/dL (ref 8–27)
Calcium: 9 mg/dL (ref 8.7–10.3)
Sodium: 143 mmol/L (ref 134–144)
Total Protein: 6.7 g/dL (ref 6.0–8.5)

## 2023-06-05 LAB — ANEMIA PROFILE B
Basophils Absolute: 0 10*3/uL (ref 0.0–0.2)
Eos: 6 %
Iron: 84 ug/dL (ref 27–139)
MCHC: 32.5 g/dL (ref 31.5–35.7)
MCV: 99 fL — ABNORMAL HIGH (ref 79–97)
Monocytes: 10 %
Platelets: 168 10*3/uL (ref 150–450)
RBC: 3.76 x10E6/uL — ABNORMAL LOW (ref 3.77–5.28)
RDW: 11.7 % (ref 11.7–15.4)
Retic Ct Pct: 1.3 % (ref 0.6–2.6)
Total Iron Binding Capacity: 316 ug/dL (ref 250–450)
WBC: 7 10*3/uL (ref 3.4–10.8)

## 2023-06-05 NOTE — Progress Notes (Signed)
Collaborated with Philis Nettle, NP Cardiology. Given recommendations, would like patient to come in for evaluation for unna boot.

## 2023-06-08 LAB — ANEMIA PROFILE B
Basos: 1 %
EOS (ABSOLUTE): 0.4 10*3/uL (ref 0.0–0.4)
Ferritin: 42 ng/mL (ref 15–150)
Hematocrit: 37.2 % (ref 34.0–46.6)
Hemoglobin: 12.1 g/dL (ref 11.1–15.9)
Immature Grans (Abs): 0 10*3/uL (ref 0.0–0.1)
Immature Granulocytes: 0 %
Iron Saturation: 27 % (ref 15–55)
Lymphocytes Absolute: 2.3 10*3/uL (ref 0.7–3.1)
Lymphs: 33 %
MCH: 32.2 pg (ref 26.6–33.0)
Monocytes Absolute: 0.7 10*3/uL (ref 0.1–0.9)
Neutrophils Absolute: 3.5 10*3/uL (ref 1.4–7.0)
Neutrophils: 50 %
UIBC: 232 ug/dL (ref 118–369)
Vitamin B-12: 414 pg/mL (ref 232–1245)

## 2023-06-08 LAB — CMP14+EGFR
Bilirubin Total: 0.3 mg/dL (ref 0.0–1.2)
CO2: 24 mmol/L (ref 20–29)
Chloride: 106 mmol/L (ref 96–106)
Creatinine, Ser: 1.33 mg/dL — ABNORMAL HIGH (ref 0.57–1.00)
Globulin, Total: 3.3 g/dL (ref 1.5–4.5)
Glucose: 78 mg/dL (ref 70–99)
Potassium: 4.6 mmol/L (ref 3.5–5.2)
eGFR: 38 mL/min/{1.73_m2} — ABNORMAL LOW (ref >59)

## 2023-06-09 NOTE — Progress Notes (Signed)
Crt and GFR stable. Patient to establish with Dr. Wolfgang Phoenix. Albumin slightly decreased but improved from previous. Recommend continuing to increase protein in diet. Alk phos slightly elevated. Patient was not fasting at last visit. Will repeat while fasting. RBC and MCV with slight variation from normal, anemia has resolved. Will monitor on future labs.

## 2023-06-10 ENCOUNTER — Telehealth: Payer: Self-pay | Admitting: Family Medicine

## 2023-06-15 ENCOUNTER — Institutional Professional Consult (permissible substitution): Payer: Medicare Other | Admitting: Internal Medicine

## 2023-06-17 NOTE — Telephone Encounter (Signed)
Spoke to patients daughter per signed DPR. She verbalized understanding

## 2023-07-08 NOTE — Progress Notes (Deleted)
Courtney Berry, female    DOB: 06-06-1933    MRN: [id number]   Brief patient profile:  ***  yo*** *** referred to pulmonary clinic in [place]  07/09/2023 by *** for ***      History of Present Illness  07/09/2023  Pulmonary/ 1st office eval/ Sherene Sires / [place] [facility]  No chief complaint on file.    Dyspnea:  *** Cough: *** Sleep: *** SABA use: *** 02: *** Lung cancer screen: ***   Outpatient Medications Prior to Visit  Medication Sig Dispense Refill   acetaminophen (TYLENOL) 500 MG tablet Take 1,000 mg by mouth every 6 (six) hours as needed for moderate pain.     albuterol (PROVENTIL) (2.5 MG/3ML) 0.083% nebulizer solution Take 3 mLs (2.5 mg total) by nebulization every 4 (four) hours as needed for wheezing or shortness of breath. 15 mL 0   amiodarone (PACERONE) 200 MG tablet TAKE 1/2 TABLET BY MOUTH DAILY 45 tablet 2   amLODipine (NORVASC) 5 MG tablet Take 1 tablet (5 mg total) by mouth daily. 90 tablet 3   apixaban (ELIQUIS) 5 MG TABS tablet Take 1 tablet (5 mg total) by mouth 2 (two) times daily. 60 tablet 5   Budeson-Glycopyrrol-Formoterol (BREZTRI AEROSPHERE) 160-9-4.8 MCG/ACT AERO Inhale 2 puffs into the lungs 2 (two) times daily. 10.7 g 11   lisinopril (ZESTRIL) 20 MG tablet TAKE 1 TABLET BY MOUTH EVERY DAY 90 tablet 1   metoprolol tartrate (LOPRESSOR) 25 MG tablet TAKE 0.5 TABLETS BY MOUTH 2 TIMES DAILY. 90 tablet 2   predniSONE (DELTASONE) 20 MG tablet One tablet a day 5 tablet 0   PROAIR HFA 108 (90 Base) MCG/ACT inhaler Inhale 1-2 puffs into the lungs as needed for wheezing or shortness of breath. 18 g 3   Probiotic Product (PROBIOTIC DAILY PO) Take 1 tablet by mouth daily.     vancomycin (VANCOCIN) 125 MG capsule Take 125 mg by mouth 4 (four) times daily.     No facility-administered medications prior to visit.    Past Medical History:  Diagnosis Date   CKD (chronic kidney disease) stage 3, GFR 30-59 ml/min (HCC)    COPD (chronic obstructive pulmonary  disease) (HCC)    Diverticulosis    Essential hypertension    History of chest pain    Low risk Cardiolite 2010   Lymphedema    Mixed hyperlipidemia    PAF (paroxysmal atrial fibrillation) (HCC)    Rheumatoid arthritis (HCC)       Objective:     There were no vitals taken for this visit.         Assessment   No problem-specific Assessment & Plan notes found for this encounter.     Sandrea Hughs, MD 07/08/2023

## 2023-07-09 ENCOUNTER — Institutional Professional Consult (permissible substitution): Payer: Medicare Other | Admitting: Internal Medicine

## 2023-07-26 ENCOUNTER — Other Ambulatory Visit: Payer: Self-pay | Admitting: Cardiology

## 2023-07-27 DIAGNOSIS — N189 Chronic kidney disease, unspecified: Secondary | ICD-10-CM | POA: Diagnosis not present

## 2023-07-27 DIAGNOSIS — I480 Paroxysmal atrial fibrillation: Secondary | ICD-10-CM | POA: Diagnosis not present

## 2023-07-27 DIAGNOSIS — D638 Anemia in other chronic diseases classified elsewhere: Secondary | ICD-10-CM | POA: Diagnosis not present

## 2023-07-27 DIAGNOSIS — N1832 Chronic kidney disease, stage 3b: Secondary | ICD-10-CM | POA: Diagnosis not present

## 2023-07-27 DIAGNOSIS — I129 Hypertensive chronic kidney disease with stage 1 through stage 4 chronic kidney disease, or unspecified chronic kidney disease: Secondary | ICD-10-CM | POA: Diagnosis not present

## 2023-08-05 NOTE — Progress Notes (Signed)
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  08/06/2023 by Neale Burly with persistent cough since dec 203 and no better on Breztri though technique very poor.   History of Present Illness  08/06/2023  Pulmonary/ 1st office eval/ [facility] / [place] [facility]  Chief Complaint  Patient presents with   Consult    Pulmonary disease   Dyspnea:  very little activity activity due lymphedema but not really limied by doe Cough: day time mild and really bad with URI's sev times a year  Sleep: bed is flat/ one pillow s resp  SABA use: none now  02: none   No obvious day to day or daytime pattern/variability or assoc excess/ purulent sputum or mucus plugs or hemoptysis or cp or chest tightness, subjective wheeze or overt sinus or hb symptoms.    Also denies any obvious fluctuation of symptoms with weather or environmental changes or other aggravating or alleviating factors except as outlined above   No unusual exposure hx or h/o childhood pna/ asthma or knowledge of premature birth.  Current Allergies, Complete Past Medical History, Past Surgical History, Family History, and Social History were reviewed in [employer] record.  ROS  The following are not active complaints unless bolded Hoarseness, sore throat/globus  dysphagia, dental problems, itching, sneezing,  nasal congestion or discharge of excess mucus or purulent secretions, ear ache,   fever, chills, sweats, unintended wt loss or wt gain, classically pleuritic or exertional cp,  orthopnea pnd or arm/hand swelling  or leg swelling, presyncope, palpitations, abdominal pain, anorexia, nausea, vomiting, diarrhea  or change in bowel habits or change in bladder habits, change in stools or change in urine, dysuria, hematuria,   rash, arthralgias, visual complaints, headache, numbness, weakness or ataxia or problems with walking or coordination,  change in mood or  memory.            Outpatient Medications Prior to Visit  Medication Sig Dispense Refill   acetaminophen (TYLENOL) 500 MG tablet Take 1,000 mg by mouth every 6 (six) hours as needed for moderate pain.     amiodarone (PACERONE) 200 MG tablet TAKE 1/2 TABLET BY MOUTH DAILY 45 tablet 2   amLODipine (NORVASC) 5 MG tablet Take 1 tablet (5 mg total) by mouth daily. 90 tablet 3   apixaban (ELIQUIS) 5 MG TABS tablet Take 1 tablet (5 mg total) by mouth 2 (two) times daily. 60 tablet 5   Budeson-Glycopyrrol-Formoterol (BREZTRI AEROSPHERE) 160-9-4.8 MCG/ACT AERO Inhale 2 puffs into the lungs 2 (two) times daily. 10.7 g 11   lisinopril (ZESTRIL) 20 MG tablet TAKE 1 TABLET BY MOUTH EVERY DAY 90 tablet 2   metoprolol tartrate (LOPRESSOR) 25 MG tablet TAKE 0.5 TABLETS BY MOUTH 2 TIMES DAILY. 90 tablet 2   predniSONE (DELTASONE) 20 MG tablet One tablet a day 5 tablet 0   Probiotic Product (PROBIOTIC DAILY PO) Take 1 tablet by mouth daily.     Vitamin D, Ergocalciferol, (DRISDOL) 1.25 MG (50000 UNIT) CAPS capsule Take 50,000 Units by mouth once a week.     albuterol (PROVENTIL) (2.5 MG/3ML) 0.083% nebulizer solution Take 3 mLs (2.5 mg total) by nebulization every 4 (four) hours as needed for wheezing or shortness of breath. (Patient not taking: Reported  on 08/06/2023) 15 mL 0   PROAIR HFA 108 (90 Base) MCG/ACT inhaler Inhale 1-2 puffs into the lungs as needed for wheezing or shortness of breath. (Patient not taking: Reported on 08/06/2023) 18 g 3   vancomycin (VANCOCIN) 125 MG capsule Take 125 mg by mouth 4 (four) times daily. (Patient not taking: Reported on 08/06/2023)     No facility-administered medications prior to visit.    Past Medical History:  Diagnosis Date   CKD (chronic kidney disease) stage 3, GFR 30-59 ml/min (HCC)    COPD (chronic obstructive pulmonary  disease) (HCC)    Diverticulosis    Essential hypertension    History of chest pain    Low risk Cardiolite 2010   Lymphedema    Mixed hyperlipidemia    PAF (paroxysmal atrial fibrillation) (HCC)    Rheumatoid arthritis (HCC)       Objective:     BP (!) 146/54   Pulse (!) 57   Ht 5\' 6"  (1.676 m)   Wt 197 lb (89.4 kg)   SpO2 94% Comment: RA  BMI 31.80 kg/m   SpO2: 94 % (RA)  W/C bound slt hoarse wf nad    HEENT : Oropharynx  clear/ no teeth      Nasal turbinates nl    NECK :  without  apparent JVD/ palpable Nodes/TM    LUNGS: no acc muscle use,  Nl contour chest which is clear to A and P bilaterally without cough on insp or exp maneuvers   CV:  RRR  no s3 or murmur or increase in P2, and mod  lymphedema both LEs  ABD:  soft and nontender with nl inspiratory excursion in the supine position. No bruits or organomegaly appreciated   MS:  Nl gait/ ext warm without deformities Or obvious joint restrictions  calf tenderness, cyanosis or clubbing    SKIN: warm and dry without lesions    NEURO:  alert, approp, nl sensorium with  no motor or cerebellar deficits apparent.     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   Primary hypertension D/c  lisinopril and started on olmesartan 20 mg  due to cough / pseudo asthma  ACE inhibitors are problematic in  pts with airway complaints because  even experienced pulmonologists can't always distinguish ace effects from copd/asthma.  By themselves they don't actually cause a problem, much like oxygen can't by itself start a fire, but they certainly serve as a powerful catalyst or enhancer for any "fire"  or inflammatory process in the upper airway, be it caused by an ET  tube or more commonly reflux (especially in the obese or pts with known  GERD or who are on biphoshonates).    In the era of ARB near equivalency until we have a better handle on the reversibility of the airway problem, it just makes sense to avoid ACEI  entirely in the short run and then decide later, having established a level of airway control using a reasonable limited regimen, whether to add back ace but even then being very careful to observe the pt for worsening airway control and number of meds used/ needed to control symptoms.    >>> try benicar 20 mg daily and self monitor, return 6 weeks with all meds in hand using a trust but verify approach to confirm accurate Medication  Reconciliation The principal here is that until we are certain that the  patients are doing what we've asked, it makes no sense to ask them to do more.          Each maintenance medication was reviewed in detail including emphasizing most importantly the difference between maintenance and prns and under what circumstances the prns are to be triggered using an action plan format where appropriate.  Total time for H and P, chart review, counseling, reviewing hfa/neb device(s) and generating customized AVS unique to this office visit / same day charting > 45 min with pt new to me.            Sandrea Hughs, MD 08/06/2023

## 2023-08-06 ENCOUNTER — Encounter: Payer: Self-pay | Admitting: Internal Medicine

## 2023-08-06 ENCOUNTER — Ambulatory Visit: Payer: Medicare Other | Admitting: Internal Medicine

## 2023-08-06 VITALS — BP 146/54 | HR 57 | Ht 66.0 in | Wt 197.0 lb

## 2023-08-06 DIAGNOSIS — I10 Essential (primary) hypertension: Secondary | ICD-10-CM

## 2023-08-06 DIAGNOSIS — J4530 Mild persistent asthma, uncomplicated: Secondary | ICD-10-CM

## 2023-08-06 DIAGNOSIS — J45909 Unspecified asthma, uncomplicated: Secondary | ICD-10-CM | POA: Insufficient documentation

## 2023-08-06 MED ORDER — OLMESARTAN MEDOXOMIL 20 MG PO TABS: 20.0000 mg | ORAL_TABLET | Freq: Every day | ORAL | 11 refills | Status: DC

## 2023-08-06 NOTE — Assessment & Plan Note (Signed)
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

## 2023-08-06 NOTE — Patient Instructions (Addendum)
Stop lisinopril   Start olmesartan 20 mg one daily in its place   Please schedule a follow up office visit in 6 weeks, call sooner if needed with all medications /inhalers/ solutions in hand so we can verify exactly what you are taking. This includes all medications from all doctors and over the counters

## 2023-08-06 NOTE — Assessment & Plan Note (Addendum)
D/c lisinopril and started on olmesartan 20 mg  due to cough / pseudo asthma  ACE inhibitors are problematic in  pts with airway complaints because  even experienced pulmonologists can't always distinguish ace effects from copd/asthma.  By themselves they don't actually cause a problem, much like oxygen can't by itself start a fire, but they certainly serve as a powerful catalyst or enhancer for any "fire"  or inflammatory process in the upper airway, be it caused by an ET  tube or more commonly reflux (especially in the obese or pts with known GERD or who are on biphoshonates).    In the era of ARB near equivalency until we have a better handle on the reversibility of the airway problem, it just makes sense to avoid ACEI  entirely in the short run and then decide later, having established a level of airway control using a reasonable limited regimen, whether to add back ace but even then being very careful to observe the pt for worsening airway control and number of meds used/ needed to control symptoms.    >>> try benicar 20 mg daily and self monitor, return 6 weeks with all meds in hand using a trust but verify approach to confirm accurate Medication  Reconciliation The principal here is that until we are certain that the  patients are doing what we've asked, it makes no sense to ask them to do more.          Each maintenance medication was reviewed in detail including emphasizing most importantly the difference between maintenance and prns and under what circumstances the prns are to be triggered using an action plan format where appropriate.  Total time for H and P, chart review, counseling, reviewing hfa/neb device(s) and generating customized AVS unique to this office visit / same day charting > 45 min with pt new to me.

## 2023-08-07 DIAGNOSIS — N1832 Chronic kidney disease, stage 3b: Secondary | ICD-10-CM | POA: Diagnosis not present

## 2023-08-07 DIAGNOSIS — D638 Anemia in other chronic diseases classified elsewhere: Secondary | ICD-10-CM | POA: Diagnosis not present

## 2023-08-07 DIAGNOSIS — I129 Hypertensive chronic kidney disease with stage 1 through stage 4 chronic kidney disease, or unspecified chronic kidney disease: Secondary | ICD-10-CM | POA: Diagnosis not present

## 2023-08-23 ENCOUNTER — Other Ambulatory Visit: Payer: Self-pay | Admitting: Cardiology

## 2023-09-03 ENCOUNTER — Emergency Department (HOSPITAL_COMMUNITY): Payer: Medicare Other

## 2023-09-03 ENCOUNTER — Encounter (HOSPITAL_COMMUNITY): Payer: Self-pay | Admitting: Emergency Medicine

## 2023-09-03 ENCOUNTER — Inpatient Hospital Stay (HOSPITAL_COMMUNITY)
Admission: EM | Admit: 2023-09-03 | Discharge: 2023-09-06 | DRG: 872 | Disposition: A | Discharge status: Home / Self Care | Payer: Medicare Other | Attending: Family Medicine | Admitting: Family Medicine

## 2023-09-03 ENCOUNTER — Other Ambulatory Visit: Payer: Self-pay

## 2023-09-03 ENCOUNTER — Ambulatory Visit: Payer: Medicare Other | Admitting: Cardiology

## 2023-09-03 DIAGNOSIS — N1832 Chronic kidney disease, stage 3b: Secondary | ICD-10-CM | POA: Diagnosis not present

## 2023-09-03 DIAGNOSIS — Z6835 Body mass index (BMI) 35.0-35.9, adult: Secondary | ICD-10-CM

## 2023-09-03 DIAGNOSIS — E66812 Obesity, class 2: Secondary | ICD-10-CM | POA: Diagnosis present

## 2023-09-03 DIAGNOSIS — E872 Acidosis, unspecified: Secondary | ICD-10-CM | POA: Insufficient documentation

## 2023-09-03 DIAGNOSIS — Z7952 Long term (current) use of systemic steroids: Secondary | ICD-10-CM

## 2023-09-03 DIAGNOSIS — J449 Chronic obstructive pulmonary disease, unspecified: Secondary | ICD-10-CM | POA: Diagnosis present

## 2023-09-03 DIAGNOSIS — R739 Hyperglycemia, unspecified: Secondary | ICD-10-CM | POA: Diagnosis not present

## 2023-09-03 DIAGNOSIS — I10 Essential (primary) hypertension: Secondary | ICD-10-CM | POA: Diagnosis not present

## 2023-09-03 DIAGNOSIS — Z888 Allergy status to other drugs, medicaments and biological substances status: Secondary | ICD-10-CM

## 2023-09-03 DIAGNOSIS — L03119 Cellulitis of unspecified part of limb: Secondary | ICD-10-CM | POA: Insufficient documentation

## 2023-09-03 DIAGNOSIS — Z881 Allergy status to other antibiotic agents status: Secondary | ICD-10-CM

## 2023-09-03 DIAGNOSIS — R6520 Severe sepsis without septic shock: Secondary | ICD-10-CM

## 2023-09-03 DIAGNOSIS — Z79899 Other long term (current) drug therapy: Secondary | ICD-10-CM | POA: Diagnosis not present

## 2023-09-03 DIAGNOSIS — R059 Cough, unspecified: Secondary | ICD-10-CM | POA: Diagnosis not present

## 2023-09-03 DIAGNOSIS — Z8249 Family history of ischemic heart disease and other diseases of the circulatory system: Secondary | ICD-10-CM

## 2023-09-03 DIAGNOSIS — I482 Chronic atrial fibrillation, unspecified: Secondary | ICD-10-CM | POA: Insufficient documentation

## 2023-09-03 DIAGNOSIS — A419 Sepsis, unspecified organism: Principal | ICD-10-CM | POA: Diagnosis present

## 2023-09-03 DIAGNOSIS — Z1152 Encounter for screening for COVID-19: Secondary | ICD-10-CM | POA: Diagnosis not present

## 2023-09-03 DIAGNOSIS — I480 Paroxysmal atrial fibrillation: Secondary | ICD-10-CM | POA: Diagnosis not present

## 2023-09-03 DIAGNOSIS — M069 Rheumatoid arthritis, unspecified: Secondary | ICD-10-CM | POA: Diagnosis not present

## 2023-09-03 DIAGNOSIS — I129 Hypertensive chronic kidney disease with stage 1 through stage 4 chronic kidney disease, or unspecified chronic kidney disease: Secondary | ICD-10-CM | POA: Diagnosis not present

## 2023-09-03 DIAGNOSIS — D539 Nutritional anemia, unspecified: Secondary | ICD-10-CM | POA: Insufficient documentation

## 2023-09-03 DIAGNOSIS — Z6828 Body mass index (BMI) 28.0-28.9, adult: Secondary | ICD-10-CM

## 2023-09-03 DIAGNOSIS — R112 Nausea with vomiting, unspecified: Secondary | ICD-10-CM | POA: Diagnosis not present

## 2023-09-03 DIAGNOSIS — L03116 Cellulitis of left lower limb: Secondary | ICD-10-CM | POA: Diagnosis present

## 2023-09-03 DIAGNOSIS — Z882 Allergy status to sulfonamides status: Secondary | ICD-10-CM

## 2023-09-03 DIAGNOSIS — E782 Mixed hyperlipidemia: Secondary | ICD-10-CM | POA: Diagnosis not present

## 2023-09-03 DIAGNOSIS — L03115 Cellulitis of right lower limb: Secondary | ICD-10-CM | POA: Diagnosis not present

## 2023-09-03 DIAGNOSIS — Z885 Allergy status to narcotic agent status: Secondary | ICD-10-CM

## 2023-09-03 DIAGNOSIS — N183 Chronic kidney disease, stage 3 unspecified: Secondary | ICD-10-CM | POA: Diagnosis present

## 2023-09-03 DIAGNOSIS — I890 Lymphedema, not elsewhere classified: Secondary | ICD-10-CM | POA: Diagnosis present

## 2023-09-03 DIAGNOSIS — Z7901 Long term (current) use of anticoagulants: Secondary | ICD-10-CM

## 2023-09-03 DIAGNOSIS — Z7951 Long term (current) use of inhaled steroids: Secondary | ICD-10-CM

## 2023-09-03 DIAGNOSIS — Z880 Allergy status to penicillin: Secondary | ICD-10-CM

## 2023-09-03 DIAGNOSIS — R000 Tachycardia, unspecified: Secondary | ICD-10-CM | POA: Diagnosis not present

## 2023-09-03 DIAGNOSIS — Z90710 Acquired absence of both cervix and uterus: Secondary | ICD-10-CM

## 2023-09-03 DIAGNOSIS — Z713 Dietary counseling and surveillance: Secondary | ICD-10-CM

## 2023-09-03 DIAGNOSIS — I517 Cardiomegaly: Secondary | ICD-10-CM | POA: Diagnosis not present

## 2023-09-03 HISTORY — DX: Acidosis, unspecified: E87.20

## 2023-09-03 HISTORY — DX: Severe sepsis without septic shock: R65.20

## 2023-09-03 HISTORY — DX: Sepsis, unspecified organism: A41.9

## 2023-09-03 LAB — CBC WITH DIFFERENTIAL/PLATELET
Abs Immature Granulocytes: 0.08 10*3/uL — ABNORMAL HIGH (ref 0.00–0.07)
Basophils Absolute: 0 10*3/uL (ref 0.0–0.1)
Basophils Relative: 0 %
Eosinophils Absolute: 0.3 10*3/uL (ref 0.0–0.5)
Eosinophils Relative: 1 %
HCT: 38.8 % (ref 36.0–46.0)
Hemoglobin: 11.9 g/dL — ABNORMAL LOW (ref 12.0–15.0)
Immature Granulocytes: 0 %
Lymphocytes Relative: 7 %
Lymphs Abs: 1.2 10*3/uL (ref 0.7–4.0)
MCH: 31.3 pg (ref 26.0–34.0)
MCHC: 30.7 g/dL (ref 30.0–36.0)
MCV: 102.1 fL — ABNORMAL HIGH (ref 80.0–100.0)
Monocytes Absolute: 0.8 10*3/uL (ref 0.1–1.0)
Monocytes Relative: 4 %
Neutro Abs: 16.5 10*3/uL — ABNORMAL HIGH (ref 1.7–7.7)
Neutrophils Relative %: 88 %
Platelets: 152 10*3/uL (ref 150–400)
RBC: 3.8 MIL/uL — ABNORMAL LOW (ref 3.87–5.11)
RDW: 12.3 % (ref 11.5–15.5)
WBC: 18.9 10*3/uL — ABNORMAL HIGH (ref 4.0–10.5)
nRBC: 0 % (ref 0.0–0.2)

## 2023-09-03 LAB — COMPREHENSIVE METABOLIC PANEL
ALT: 14 U/L (ref 0–44)
AST: 20 U/L (ref 15–41)
Albumin: 3.2 g/dL — ABNORMAL LOW (ref 3.5–5.0)
Alkaline Phosphatase: 104 U/L (ref 38–126)
Anion gap: 9 (ref 5–15)
BUN: 21 mg/dL (ref 8–23)
CO2: 26 mmol/L (ref 22–32)
Calcium: 8.6 mg/dL — ABNORMAL LOW (ref 8.9–10.3)
Chloride: 102 mmol/L (ref 98–111)
Creatinine, Ser: 1.41 mg/dL — ABNORMAL HIGH (ref 0.44–1.00)
GFR, Estimated: 36 mL/min — ABNORMAL LOW (ref >60)
Glucose, Bld: 172 mg/dL — ABNORMAL HIGH (ref 70–99)
Potassium: 4.4 mmol/L (ref 3.5–5.1)
Sodium: 137 mmol/L (ref 135–145)
Total Bilirubin: 0.7 mg/dL (ref 0.3–1.2)
Total Protein: 7.3 g/dL (ref 6.5–8.1)

## 2023-09-03 LAB — APTT: aPTT: 33 s (ref 24–36)

## 2023-09-03 LAB — PROTIME-INR
INR: 1.3 — ABNORMAL HIGH (ref 0.8–1.2)
Prothrombin Time: 16.8 s — ABNORMAL HIGH (ref 11.4–15.2)

## 2023-09-03 LAB — RESP PANEL BY RT-PCR (RSV, FLU A&B, COVID)  RVPGX2
Influenza A by PCR: NEGATIVE
Influenza B by PCR: NEGATIVE
Resp Syncytial Virus by PCR: NEGATIVE
SARS Coronavirus 2 by RT PCR: NEGATIVE

## 2023-09-03 LAB — LACTIC ACID, PLASMA
Lactic Acid, Venous: 2.2 mmol/L (ref 0.5–1.9)
Lactic Acid, Venous: 2.6 mmol/L (ref 0.5–1.9)
Lactic Acid, Venous: 1.1 mmol/L (ref 0.5–1.9)

## 2023-09-03 LAB — SARS CORONAVIRUS 2 BY RT PCR: SARS Coronavirus 2 by RT PCR: NEGATIVE

## 2023-09-03 MED ORDER — LEVOFLOXACIN IN D5W 750 MG/150ML IV SOLN: 750.0000 mg | INTRAVENOUS | Status: DC

## 2023-09-03 MED ORDER — ONDANSETRON HCL 4 MG PO TABS: 4.0000 mg | ORAL_TABLET | Freq: Four times a day (QID) | ORAL | Status: DC | PRN

## 2023-09-03 MED ORDER — ONDANSETRON HCL 4 MG/2ML IJ SOLN: 4.0000 mg | Freq: Four times a day (QID) | INTRAMUSCULAR | Status: DC | PRN

## 2023-09-03 MED ORDER — LACTATED RINGERS IV SOLN: INTRAVENOUS | Status: AC

## 2023-09-03 MED ORDER — ONDANSETRON HCL 4 MG/2ML IJ SOLN
4.0000 mg | Freq: Once | INTRAMUSCULAR | Status: AC
  Administered 2023-09-03: 4 mg via INTRAVENOUS
  Filled 2023-09-03: qty 2

## 2023-09-03 MED ORDER — LEVOFLOXACIN IN D5W 750 MG/150ML IV SOLN
750.0000 mg | Freq: Once | INTRAVENOUS | Status: AC
  Administered 2023-09-03: 750 mg via INTRAVENOUS
  Filled 2023-09-03: qty 150

## 2023-09-03 MED ORDER — ACETAMINOPHEN 10 MG/ML IV SOLN
1000.0000 mg | Freq: Four times a day (QID) | INTRAVENOUS | Status: AC
  Administered 2023-09-04 (×3): 1000 mg via INTRAVENOUS
  Filled 2023-09-03 (×4): qty 100

## 2023-09-03 MED ORDER — ACETAMINOPHEN 500 MG PO TABS
1000.0000 mg | ORAL_TABLET | Freq: Once | ORAL | Status: AC
  Administered 2023-09-03: 1000 mg via ORAL
  Filled 2023-09-03: qty 2

## 2023-09-03 MED ORDER — VANCOMYCIN HCL IN DEXTROSE 1-5 GM/200ML-% IV SOLN: 1000.0000 mg | Freq: Once | INTRAVENOUS | Status: DC

## 2023-09-03 MED ORDER — LACTATED RINGERS IV BOLUS
500.0000 mL | Freq: Once | INTRAVENOUS | Status: AC
  Administered 2023-09-03: 500 mL via INTRAVENOUS

## 2023-09-03 MED ORDER — VANCOMYCIN HCL 1500 MG/300ML IV SOLN
1500.0000 mg | INTRAVENOUS | Status: DC
  Administered 2023-09-05: 1500 mg via INTRAVENOUS
  Filled 2023-09-03: qty 300

## 2023-09-03 MED ORDER — VANCOMYCIN HCL 1750 MG/350ML IV SOLN
1750.0000 mg | Freq: Once | INTRAVENOUS | Status: AC
  Administered 2023-09-03: 1750 mg via INTRAVENOUS
  Filled 2023-09-03: qty 350

## 2023-09-03 NOTE — ED Notes (Signed)
Date and time results received: 09/03/23 1529   Test: Lactic acid  Critical Value: 2.2  Name of Provider Notified: Eber Hong

## 2023-09-03 NOTE — H&P (Addendum)
History and Physical    Patient: Courtney Berry:[id number] DOB: 1933-06-01 DOA: 09/03/2023 DOS: the patient was seen and examined on 09/03/2023 PCP: Arrie Senate, FNP  Patient coming from: Home  Chief Complaint:  Chief Complaint  Patient presents with   Fever   HPI: Courtney Berry is a 89 y.o. female with medical history significant of Atrial fibrillation on Eliquis, hypertension, CKD 3B, COPD who presents to the emergency department due to 4-day onset of feeling sick and this became worse due to increased vomiting and shakiness.  She endorsed baseline history of lymphedema, but this has resulted in increased swelling, redness and tenderness in her legs within the last 24 hours.  Nonbilious vomiting and fever started today.  She has not taken any medication prior to presenting to the ED and she denies any sick contact.  ED Course:  In the emergency department, patient was febrile with a temperature of 102.2, other vital signs are within normal range.  Workup in the ED showed leukocytosis, macrocytic anemia.  BMP was normal except for blood glucose of 172, creatinine 1.41 (creatinine is within baseline range).  Lactic acid 2.2 > 2.6.  Influenza A, B, SARS coronavirus 2, RSV was negative.  Blood culture pending. Chest x-ray showed no active disease. Patient was started on vancomycin and Levaquin (patient has penicillin allergy).  Tylenol was given due to fever.  IV hydration was provided. Hospitalist was asked to admit patient for further evaluation and management.  Review of Systems: Review of systems as noted in the HPI. All other systems reviewed and are negative.   Past Medical History:  Diagnosis Date   CKD (chronic kidney disease) stage 3, GFR 30-59 ml/min (HCC)    COPD (chronic obstructive pulmonary disease) (HCC)    Diverticulosis    Essential hypertension    History of chest pain    Low risk Cardiolite 2010   Lymphedema    Mixed hyperlipidemia    PAF  (paroxysmal atrial fibrillation) (HCC)    Rheumatoid arthritis (HCC)    Past Surgical History:  Procedure Laterality Date   TOTAL ABDOMINAL HYSTERECTOMY  11/18/1967    Social History:  reports that she has never smoked. She has never been exposed to tobacco smoke. She has never used smokeless tobacco. She reports that she does not drink alcohol and does not use drugs.   Allergies  Allergen Reactions   Clopidogrel Bisulfate Swelling    Face swells    Darvocet [Propoxyphene N-Acetaminophen] Other (See Comments)    unknown   Doxycycline Itching   Lasix [Furosemide]    Nitrofurantoin Monohyd Macro Other (See Comments)    unknown   Plavix [Clopidogrel Bisulfate] Swelling    Face swells   Sulfa Antibiotics Other (See Comments)    "face got puffy"   Amitriptyline Other (See Comments)    Made her "talk out of her head" and sleep until lunch the next day.   Gabapentin Other (See Comments)    Made her "talk out of her head".   Omnicef [Cefdinir] Nausea And Vomiting and Rash   Penicillins Rash    immediate rash, facial/tongue/throat swelling, SOB or lightheadedness with hypotension     Family History  Problem Relation Age of Onset   Colon cancer Father    Cancer Mother    Heart disease Sister        CABG age 67   Cancer Brother      Prior to Admission medications   Medication Sig Start Date End  Date Taking? Authorizing Provider  amLODipine (NORVASC) 10 MG tablet Take 10 mg by mouth daily. 07/25/23  Yes [provider]  acetaminophen (TYLENOL) 500 MG tablet Take 1,000 mg by mouth every 6 (six) hours as needed for moderate pain.    [provider]  albuterol (PROVENTIL) (2.5 MG/3ML) 0.083% nebulizer solution Take 3 mLs (2.5 mg total) by nebulization every 4 (four) hours as needed for wheezing or shortness of breath. Patient not taking: Reported on 08/06/2023 04/17/23   Arrie Senate, FNP  amiodarone (PACERONE) 200 MG tablet TAKE 1/2 TABLET BY MOUTH DAILY  12/25/22   Jonelle Sidle, MD  amLODipine (NORVASC) 5 MG tablet TAKE 1 TABLET (5 MG TOTAL) BY MOUTH DAILY. 08/24/23   Jonelle Sidle, MD  apixaban (ELIQUIS) 5 MG TABS tablet Take 1 tablet (5 mg total) by mouth 2 (two) times daily. 05/18/23   Jonelle Sidle, MD  Budeson-Glycopyrrol-Formoterol (BREZTRI AEROSPHERE) 160-9-4.8 MCG/ACT AERO Inhale 2 puffs into the lungs 2 (two) times daily. 04/17/23   Arrie Senate, FNP  lisinopril (ZESTRIL) 20 MG tablet TAKE 1 TABLET BY MOUTH EVERY DAY 07/27/23   Jonelle Sidle, MD  metoprolol tartrate (LOPRESSOR) 25 MG tablet TAKE 1/2 TABLET TWICE A DAY BY MOUTH 08/24/23   Jonelle Sidle, MD  olmesartan (BENICAR) 20 MG tablet Take 1 tablet (20 mg total) by mouth daily. 08/06/23   Nyoka Cowden, MD  predniSONE (DELTASONE) 20 MG tablet One tablet a day 04/11/23   Osie Cheeks  [facility] HFA 108 [phone] Base) MCG/ACT inhaler Inhale 1-2 puffs into the lungs as needed for wheezing or shortness of breath. Patient not taking: Reported on 08/06/2023 02/26/21   Gwenlyn Fudge, FNP  Probiotic Product (PROBIOTIC DAILY PO) Take 1 tablet by mouth daily.    [provider]  vancomycin (VANCOCIN) 125 MG capsule Take 125 mg by mouth 4 (four) times daily. Patient not taking: Reported on 08/06/2023 01/26/23   [provider]  Vitamin D, Ergocalciferol, (DRISDOL) 1.25 MG (50000 UNIT) CAPS capsule Take 50,000 Units by mouth once a week. 04/27/23   [provider]    Physical Exam: BP (!) 111/53   Pulse 70   Temp 99 F (37.2 C) (Oral)   Resp (!) 22   Ht 5\' 8"  (1.727 m)   Wt 85.7 kg   SpO2 93%   BMI 28.74 kg/m   General: 89 y.o. year-old female well developed well nourished in no acute distress.  Alert and oriented x3. HEENT: NCAT, EOMI Neck: Supple, trachea medial Cardiovascular: Regular rate and rhythm with no rubs or gallops.  No thyromegaly or JVD noted.  2/4 pulses in all 4 extremities. Respiratory: Clear to auscultation with  no wheezes or rales. Good inspiratory effort. Abdomen: Soft, nontender nondistended with normal bowel sounds x4 quadrants. Muskuloskeletal: Bilateral lower extremity edema, warm, erythematous and tender to touch   Neuro: CN II-XII intact, strength 5/5 x 4, sensation, reflexes intact Skin: Bilateral lower extremity edema, warm, erythematous and tender to touch Psychiatry: Mood is appropriate for condition and setting          Labs on Admission:  Basic Metabolic Panel: Recent Labs  Lab 09/03/23 1609  NA 137  K 4.4  CL 102  CO2 26  GLUCOSE 172*  BUN 21  CREATININE 1.41*  CALCIUM 8.6*   Liver Function Tests: Recent Labs  Lab 09/03/23 1609  AST 20  ALT 14  ALKPHOS 104  BILITOT 0.7  PROT  7.3  ALBUMIN 3.2*   No results for input(s): "LIPASE", "AMYLASE" in the last 168 hours. No results for input(s): "AMMONIA" in the last 168 hours. CBC: Recent Labs  Lab 09/03/23 1609  WBC 18.9*  NEUTROABS 16.5*  HGB 11.9*  HCT 38.8  MCV 102.1*  PLT 152   Cardiac Enzymes: No results for input(s): "CKTOTAL", "CKMB", "CKMBINDEX", "TROPONINI" in the last 168 hours.  BNP (last 3 results) Recent Labs    01/06/23 1435  BNP 56.4    ProBNP (last 3 results) No results for input(s): "PROBNP" in the last 8760 hours.  CBG: No results for input(s): "GLUCAP" in the last 168 hours.  Radiological Exams on Admission: DG Chest Portable 1 View  Result Date: 09/03/2023 CLINICAL DATA:  Cough EXAM: PORTABLE CHEST 1 VIEW COMPARISON:  Chest x-ray May 25, 24. FINDINGS: Enlarged cardiac silhouette, similar. Both lungs are clear. No visible pleural effusions or pneumothorax. No acute osseous abnormality. IMPRESSION: 1. No active disease. 2. Chronic cardiomegaly. Electronically Signed   By: Feliberto Harts M.D.   On: 09/03/2023 16:58    EKG: I independently viewed the EKG done and my findings are as followed: Normal sinus rhythm at a rate of 85 bpm  Assessment/Plan Present on Admission:  Severe  sepsis (HCC)  CKD (chronic kidney disease) stage 3, GFR 30-59 ml/min (HCC)  Chronic obstructive pulmonary disease (HCC)  Essential hypertension  Nausea & vomiting  Principal Problem:   Severe sepsis (HCC) Active Problems:   Essential hypertension   Chronic obstructive pulmonary disease (HCC)   Nausea & vomiting   CKD (chronic kidney disease) stage 3, GFR 30-59 ml/min (HCC)   Cellulitis of lower extremity   Atrial fibrillation, chronic (HCC)   Macrocytic anemia   Hyperglycemia  Severe sepsis possibly due to cellulitis of lower extremity Lactic acidosis in setting of above Patient met SIRS criteria due to fever and leukocytosis (met SIRS criteria) and source of infection being cellulitis of the leg (met sepsis criteria).  Sepsis is severe due to lactic acid being greater than 2.0 (2.2 > 2.6). Patient was started on IV vancomycin and Levaquin, we shall continue with same at this time Continue IV hydration and continue to trend lactic acid  Nausea and vomiting Continue Zofran as needed  Macrocytic anemia MCV 102.1, hemoglobin 11.9, hematocrit 38.8 Folate and vitamin B12 levels will be checked  Hyperglycemia possibly reactive Hemoglobin A1c in February 2024 was 5.4 Continue to monitor blood glucose level and consider checking hemoglobin A1c if blood glucose level continue to stay elevated.  CKD 3B Creatinine 4.1, this is within baseline range. Renally adjust medications, avoid nephrotoxic agents/dehydration/hypotension  Chronic atrial fibrillation EKG personally reviewed showed normal sinus rhythm at a rate of 85 bpm Continue amiodarone and Eliquis Metoprolol will be temporarily held at this time due to soft BP  Essential hypertension BP meds will be held at this time due to soft BP  COPD Continue Breztri  DVT prophylaxis: Eliquis   Advance Care Planning: CODE STATUS: Full code  Consults: None  Family Communication: Husband and daughter at bedside (all questions  answered to satisfaction)  Severity of Illness: The appropriate patient status for this patient is INPATIENT. Inpatient status is judged to be reasonable and necessary in order to provide the required intensity of service to ensure the patient's safety. The patient's presenting symptoms, physical exam findings, and initial radiographic and laboratory data in the context of their chronic comorbidities is felt to place them at high risk for further clinical  deterioration. Furthermore, it is not anticipated that the patient will be medically stable for discharge from the hospital within 2 midnights of admission.   * I certify that at the point of admission it is my clinical judgment that the patient will require inpatient hospital care spanning beyond 2 midnights from the point of admission due to high intensity of service, high risk for further deterioration and high frequency of surveillance required.*  Author: Frankey Shown, DO 09/03/2023 9:04 PM  For on call review [email or web address].

## 2023-09-03 NOTE — Sepsis Progress Note (Signed)
Repeat lactic acid level was 2.6. Notified provider of need to order lactic acid levels.

## 2023-09-03 NOTE — Sepsis Progress Note (Signed)
Notified bedside nurse of need to draw repeat lactic acid. 

## 2023-09-03 NOTE — Sepsis Progress Note (Signed)
Elink monitoring for the code sepsis protocol.  

## 2023-09-03 NOTE — ED Provider Notes (Signed)
[place] EMERGENCY DEPARTMENT AT [facility] Provider Note   CSN: [id number] Arrival date & time: 09/03/23  1446     History  Chief Complaint  Patient presents with   Fever    Courtney Berry is a 89 y.o. female.   Fever  This patient is an 89 year old female, she has a history of atrial fibrillation on amiodarone and Eliquis, it appears that she also takes an ACE inhibitor lisinopril, metoprolol, olmesartan, she presents to the hospital with a fever, states that she has been sick for approximately 4 days and feels like she was getting worse, today she started to have increasing vomiting and feeling like she was shaky.  She has not been seen by her family doctor, has not had any testing and has not been on any antibiotics, she has not been around anybody who has been sick, her significant other is with her in the room and he has not been ill, the daughter has not been ill.  The patient denies abdominal pain coughing but has had a little bit of shortness of breath, little bit of a runny nose, she reports having "lymphedema" of her legs but they are usually not red or swollen or tender and today they are very red and tender.  That started yesterday and has gotten worse`    Home Medications Prior to Admission medications   Medication Sig Start Date End Date Taking? Authorizing Provider  acetaminophen (TYLENOL) 500 MG tablet Take 1,000 mg by mouth every 6 (six) hours as needed for moderate pain.    [provider]  albuterol (PROVENTIL) (2.5 MG/3ML) 0.083% nebulizer solution Take 3 mLs (2.5 mg total) by nebulization every 4 (four) hours as needed for wheezing or shortness of breath. Patient not taking: Reported on 08/06/2023 04/17/23   Arrie Senate, FNP  amiodarone (PACERONE) 200 MG tablet TAKE 1/2 TABLET BY MOUTH DAILY 12/25/22   Jonelle Sidle, MD  amLODipine (NORVASC) 5 MG tablet TAKE 1 TABLET (5 MG TOTAL) BY MOUTH DAILY. 08/24/23   Jonelle Sidle, MD   apixaban (ELIQUIS) 5 MG TABS tablet Take 1 tablet (5 mg total) by mouth 2 (two) times daily. 05/18/23   Jonelle Sidle, MD  Budeson-Glycopyrrol-Formoterol (BREZTRI AEROSPHERE) 160-9-4.8 MCG/ACT AERO Inhale 2 puffs into the lungs 2 (two) times daily. 04/17/23   Arrie Senate, FNP  lisinopril (ZESTRIL) 20 MG tablet TAKE 1 TABLET BY MOUTH EVERY DAY 07/27/23   Jonelle Sidle, MD  metoprolol tartrate (LOPRESSOR) 25 MG tablet TAKE 1/2 TABLET TWICE A DAY BY MOUTH 08/24/23   Jonelle Sidle, MD  olmesartan (BENICAR) 20 MG tablet Take 1 tablet (20 mg total) by mouth daily. 08/06/23   Nyoka Cowden, MD  predniSONE (DELTASONE) 20 MG tablet One tablet a day 04/11/23   Osie Cheeks  [facility] HFA [phone] Base) MCG/ACT inhaler Inhale 1-2 puffs into the lungs as needed for wheezing or shortness of breath. Patient not taking: Reported on 08/06/2023 02/26/21   Gwenlyn Fudge, FNP  Probiotic Product (PROBIOTIC DAILY PO) Take 1 tablet by mouth daily.    [provider]  vancomycin (VANCOCIN) 125 MG capsule Take 125 mg by mouth 4 (four) times daily. Patient not taking: Reported on 08/06/2023 01/26/23   [provider]  Vitamin D, Ergocalciferol, (DRISDOL) 1.25 MG (50000 UNIT) CAPS capsule Take 50,000 Units by mouth once a week. 04/27/23   [provider]      Allergies  Clopidogrel bisulfate, Darvocet [propoxyphene n-acetaminophen], Doxycycline, Lasix [furosemide], Nitrofurantoin monohyd macro, Plavix [clopidogrel bisulfate], Sulfa antibiotics, Amitriptyline, Gabapentin, Omnicef [cefdinir], and Penicillins    Review of Systems   Review of Systems  Constitutional:  Positive for fever.  All other systems reviewed and are negative.   Physical Exam Updated Vital Signs BP 117/84   Pulse 92   Temp (!) 102.2 F (39 C) (Oral)   Resp 20   Ht 1.727 m (5\' 8" )   Wt 85.7 kg   SpO2 94%   BMI 28.74 kg/m  Physical Exam Vitals and nursing note reviewed.   Constitutional:      General: She is in acute distress.     Appearance: She is well-developed. She is ill-appearing.  HENT:     Head: Normocephalic and atraumatic.     Mouth/Throat:     Pharynx: No oropharyngeal exudate.  Eyes:     General: No scleral icterus.       Right eye: No discharge.        Left eye: No discharge.     Conjunctiva/sclera: Conjunctivae normal.     Pupils: Pupils are equal, round, and reactive to light.  Neck:     Thyroid: No thyromegaly.     Vascular: No JVD.  Cardiovascular:     Rate and Rhythm: Normal rate and regular rhythm.     Heart sounds: Normal heart sounds. No murmur heard.    No friction rub. No gallop.     Comments: Borderline tachycardia, normal pulses, bilateral lower extremity edema Pulmonary:     Effort: Pulmonary effort is normal. No respiratory distress.     Breath sounds: Normal breath sounds. No wheezing or rales.  Abdominal:     General: Bowel sounds are normal. There is no distension.     Palpations: Abdomen is soft. There is no mass.     Tenderness: There is no abdominal tenderness.  Musculoskeletal:        General: No tenderness. Normal range of motion.     Cervical back: Normal range of motion and neck supple.     Right lower leg: Edema present.     Left lower leg: Edema present.  Lymphadenopathy:     Cervical: No cervical adenopathy.  Skin:    General: Skin is warm and dry.     Findings: Erythema and rash present.  Neurological:     General: No focal deficit present.     Mental Status: She is alert.     Coordination: Coordination normal.  Psychiatric:        Behavior: Behavior normal.     ED Results / Procedures / Treatments   Labs (all labs ordered are listed, but only abnormal results are displayed) Labs Reviewed  SARS CORONAVIRUS 2 BY RT PCR  RESP PANEL BY RT-PCR (RSV, FLU A&B, COVID)  [id number]  CULTURE, BLOOD (ROUTINE X 2)  CULTURE, BLOOD (ROUTINE X 2)  URINALYSIS, ROUTINE W REFLEX MICROSCOPIC  CBC WITH  DIFFERENTIAL/PLATELET  LACTIC ACID, PLASMA  LACTIC ACID, PLASMA  COMPREHENSIVE METABOLIC PANEL  PROTIME-INR  APTT  URINALYSIS, W/ REFLEX TO CULTURE (INFECTION SUSPECTED)    EKG None  Radiology No results found.  Procedures .Critical Care  Performed by: Eber Hong, MD Authorized by: Eber Hong, MD   Critical care provider statement:    Critical care time (minutes):  45   Critical care time was exclusive of:  Separately billable procedures and treating other patients   Critical care was necessary to treat or prevent  imminent or life-threatening deterioration of the following conditions:  Sepsis   Critical care was time spent personally by me on the following activities:  Development of treatment plan with patient or surrogate, discussions with consultants, evaluation of patient's response to treatment, examination of patient, obtaining history from patient or surrogate, review of old charts, re-evaluation of patient's condition, pulse oximetry, ordering and review of radiographic studies, ordering and review of laboratory studies and ordering and performing treatments and interventions   I assumed direction of critical care for this patient from another provider in my specialty: no     Care discussed with: admitting provider   Comments:           Medications Ordered in ED Medications  lactated ringers infusion (has no administration in time range)  vancomycin (VANCOCIN) IVPB 1000 mg/200 mL premix (has no administration in time range)  levofloxacin (LEVAQUIN) IVPB 750 mg (has no administration in time range)  acetaminophen (TYLENOL) tablet 1,000 mg (1,000 mg Oral Given 09/03/23 1530)    ED Course/ Medical Decision Making/ A&P                                 Medical Decision Making Amount and/or Complexity of Data Reviewed Labs: ordered. Radiology: ordered.  Risk OTC drugs. Prescription drug management. Decision regarding hospitalization.    This patient  presents to the ED for concern of fever vomiting and feeling very poorly, this involves an extensive number of treatment options, and is a complaint that carries with it a high risk of complications and morbidity.  The differential diagnosis includes sepsis, severe sepsis, cellulitis, could have other sources of infection given the vomiting so workup will ensue and a septic workup   Co morbidities that complicate the patient evaluation  Obesity, hypertension   Additional history obtained:  Additional history obtained from medical record External records from outside source obtained and reviewed including prior office visits and prior admissions to the hospital Echocardiogram from 2019 showing ejection fraction of 60 to 65% with grade 1 diastolic dysfunction, stress test performed October 2019, shows ejection fraction of 77% and a low risk study without ST segment deviation   Lab Tests:  I Ordered, and personally interpreted labs.  The pertinent results include:  leukocytosis at 19,000, lactic elevated at 2.2 then rose to 2.6,     Cardiac Monitoring: / EKG:  The patient was maintained on a cardiac monitor.  I personally viewed and interpreted the cardiac monitored which showed an underlying rhythm of: bordering tachycardia   Consultations Obtained:  I requested consultation with the hositpiast,  and discussed lab and imaging findings as well as pertinent plan - they recommend: admit   Problem List / ED Course / Critical interventions / Medication management  Has sepsis syndrome with fever, WBC elevation and source of infection - she is ill appearing, vomiting and needs abx. I ordered medication including abx  for sepsis  Reevaluation of the patient after these medicines showed that the patient improved I have reviewed the patients home medicines and have made adjustments as needed   Social Determinants of Health:  Septic, elderly - needs admission   Test / Admission -  Considered:  Admit to hospitalist.         Final Clinical Impression(s) / ED Diagnoses Final diagnoses:  Severe sepsis (HCC)    Rx / DC Orders ED Discharge Orders     None  Eber Hong, MD 09/04/23 [phone]

## 2023-09-03 NOTE — ED Triage Notes (Signed)
Pt presents, chills, fever, emesis and body aches for several days.

## 2023-09-03 NOTE — Progress Notes (Signed)
Pharmacy Antibiotic Note  Courtney Berry is a 89 y.o. female admitted on 09/03/2023 with cellulitis.  Pharmacy has been consulted for vancomycin and levaquin dosing.  Plan: Vancomycin 1750 mg IV x 1 dose. Vancomycin 1500 mg IV every 48 hours. Levaquin 750 mg IV every 48 hours. Monitor labs, c/s, and vanco levels as indicated  Height: 5\' 8"  (172.7 cm) Weight: 85.7 kg (189 lb) IBW/kg (Calculated) : 63.9  Temp (24hrs), Avg:100.5 F (38.1 C), Min:98.4 F (36.9 C), Max:102.2 F (39 C)  Recent Labs  Lab 09/03/23 1609  WBC 18.9*  CREATININE 1.41*  LATICACIDVEN 2.2*    Estimated Creatinine Clearance: 31.6 mL/min (A) (by C-G formula based on SCr of 1.41 mg/dL (H)).    Allergies  Allergen Reactions   Clopidogrel Bisulfate Swelling    Face swells    Darvocet [Propoxyphene N-Acetaminophen] Other (See Comments)    unknown   Doxycycline Itching   Lasix [Furosemide]    Nitrofurantoin Monohyd Macro Other (See Comments)    unknown   Plavix [Clopidogrel Bisulfate] Swelling    Face swells   Sulfa Antibiotics Other (See Comments)    "face got puffy"   Amitriptyline Other (See Comments)    Made her "talk out of her head" and sleep until lunch the next day.   Gabapentin Other (See Comments)    Made her "talk out of her head".   Omnicef [Cefdinir] Nausea And Vomiting and Rash   Penicillins Rash    Has patient had a PCN reaction causing immediate rash, facial/tongue/throat swelling, SOB or lightheadedness with hypotension: Yes Has patient had a PCN reaction causing severe rash involving mucus membranes or skin necrosis: No Has patient had a PCN reaction that required hospitalization: No Has patient had a PCN reaction occurring within the last 10 years: No If all of the above answers are "NO", then may proceed with Cephalosporin use.     Antimicrobials this admission: Vanco 10/17 >> Levaquin 10/17 >>  Microbiology results: 10/17 BCx: pending   Thank you for allowing pharmacy  to be a part of this patient's care.  Judeth Cornfield, PharmD Clinical Pharmacist 09/03/2023 5:34 PM

## 2023-09-04 DIAGNOSIS — N1832 Chronic kidney disease, stage 3b: Secondary | ICD-10-CM | POA: Diagnosis not present

## 2023-09-04 DIAGNOSIS — E872 Acidosis, unspecified: Secondary | ICD-10-CM

## 2023-09-04 DIAGNOSIS — L03119 Cellulitis of unspecified part of limb: Secondary | ICD-10-CM | POA: Diagnosis not present

## 2023-09-04 DIAGNOSIS — I482 Chronic atrial fibrillation, unspecified: Secondary | ICD-10-CM | POA: Diagnosis not present

## 2023-09-04 DIAGNOSIS — A419 Sepsis, unspecified organism: Secondary | ICD-10-CM | POA: Diagnosis not present

## 2023-09-04 LAB — PHOSPHORUS: Phosphorus: 3.3 mg/dL (ref 2.5–4.6)

## 2023-09-04 LAB — CBC
HCT: 30.6 % — ABNORMAL LOW (ref 36.0–46.0)
Hemoglobin: 9.5 g/dL — ABNORMAL LOW (ref 12.0–15.0)
MCH: 32.1 pg (ref 26.0–34.0)
MCHC: 31 g/dL (ref 30.0–36.0)
MCV: 103.4 fL — ABNORMAL HIGH (ref 80.0–100.0)
Platelets: 135 10*3/uL — ABNORMAL LOW (ref 150–400)
RBC: 2.96 MIL/uL — ABNORMAL LOW (ref 3.87–5.11)
RDW: 12.3 % (ref 11.5–15.5)
WBC: 20.4 10*3/uL — ABNORMAL HIGH (ref 4.0–10.5)
nRBC: 0 % (ref 0.0–0.2)

## 2023-09-04 LAB — CREATININE, SERUM
Creatinine, Ser: 1.41 mg/dL — ABNORMAL HIGH (ref 0.44–1.00)
GFR, Estimated: 36 mL/min — ABNORMAL LOW (ref >60)

## 2023-09-04 LAB — URINALYSIS, W/ REFLEX TO CULTURE (INFECTION SUSPECTED)
Bilirubin Urine: NEGATIVE
Glucose, UA: NEGATIVE mg/dL
Ketones, ur: NEGATIVE mg/dL
Leukocytes,Ua: NEGATIVE
Nitrite: NEGATIVE
Protein, ur: NEGATIVE mg/dL
Specific Gravity, Urine: 1.016 (ref 1.005–1.030)
pH: 5 (ref 5.0–8.0)

## 2023-09-04 LAB — VITAMIN B12: Vitamin B-12: 200 pg/mL (ref 180–914)

## 2023-09-04 LAB — FOLATE: Folate: 12.5 ng/mL (ref >5.9)

## 2023-09-04 LAB — MAGNESIUM: Magnesium: 1.9 mg/dL (ref 1.7–2.4)

## 2023-09-04 MED ORDER — UMECLIDINIUM BROMIDE 62.5 MCG/ACT IN AEPB
1.0000 | INHALATION_SPRAY | Freq: Every day | RESPIRATORY_TRACT | Status: DC
  Filled 2023-09-04: qty 7

## 2023-09-04 MED ORDER — VITAMIN B-12 1000 MCG PO TABS
1000.0000 ug | ORAL_TABLET | Freq: Every day | ORAL | Status: DC
  Administered 2023-09-04 – 2023-09-06 (×3): 1000 ug via ORAL
  Filled 2023-09-04 (×4): qty 1

## 2023-09-04 MED ORDER — METOPROLOL TARTRATE 25 MG PO TABS
12.5000 mg | ORAL_TABLET | Freq: Two times a day (BID) | ORAL | Status: DC
Start: 2023-09-04 — End: 2023-09-04

## 2023-09-04 MED ORDER — APIXABAN 5 MG PO TABS
5.0000 mg | ORAL_TABLET | Freq: Two times a day (BID) | ORAL | Status: DC
  Administered 2023-09-04 – 2023-09-06 (×6): 5 mg via ORAL
  Filled 2023-09-04 (×7): qty 1

## 2023-09-04 MED ORDER — METOPROLOL TARTRATE 25 MG PO TABS
12.5000 mg | ORAL_TABLET | Freq: Two times a day (BID) | ORAL | Status: DC
  Administered 2023-09-05 – 2023-09-06 (×3): 12.5 mg via ORAL
  Filled 2023-09-04 (×4): qty 1

## 2023-09-04 MED ORDER — BUDESON-GLYCOPYRROL-FORMOTEROL 160-9-4.8 MCG/ACT IN AERO: 2.0000 | INHALATION_SPRAY | Freq: Two times a day (BID) | RESPIRATORY_TRACT | Status: DC

## 2023-09-04 MED ORDER — TRAMADOL HCL 50 MG PO TABS: 50.0000 mg | ORAL_TABLET | Freq: Four times a day (QID) | ORAL | Status: DC | PRN

## 2023-09-04 MED ORDER — MOMETASONE FURO-FORMOTEROL FUM 100-5 MCG/ACT IN AERO
2.0000 | INHALATION_SPRAY | Freq: Two times a day (BID) | RESPIRATORY_TRACT | Status: DC
  Filled 2023-09-04: qty 8.8

## 2023-09-04 MED ORDER — ACETAMINOPHEN 325 MG PO TABS
650.0000 mg | ORAL_TABLET | Freq: Four times a day (QID) | ORAL | Status: DC | PRN
  Administered 2023-09-04: 650 mg via ORAL
  Filled 2023-09-04: qty 2

## 2023-09-04 MED ORDER — AMIODARONE HCL 200 MG PO TABS
100.0000 mg | ORAL_TABLET | Freq: Every day | ORAL | Status: DC
  Administered 2023-09-04 – 2023-09-06 (×3): 100 mg via ORAL
  Filled 2023-09-04 (×4): qty 1

## 2023-09-04 NOTE — Progress Notes (Signed)
[facility]                                                                               Courtney Berry, is a 89 y.o. female, DOB - 20-Jul-1933, ZOX:[id number] Admit date - 09/03/2023    Outpatient Primary MD for the patient is Milian, Aleen Campi, FNP  LOS - 1  days    Brief summary   Courtney Berry is a 89 y.o. female with medical history significant of Atrial fibrillation on Eliquis, hypertension, CKD 3B, COPD who presents to the emergency department due to 4-day onset of feeling sick and this became worse due to increased vomiting and shakiness.  She endorsed baseline history of lymphedema, but this has resulted in increased swelling, redness and tenderness in her legs within the last 24 hours.   Assessment & Plan    Assessment and Plan:   Severe sepsis secondary to lower extremity cellulitis  Fever, leukocytosis, cellulitis and lactic acid >2 on admission.  She was empirically started on IV antibiotics, recommend to continue broad spectrum IV antibiotics for another 24 hours.  Follow up cultures.  Elevated the legs.     Nausea and vomiting resolved.    Macrocytic anemia:  Low normal B 12 Levels.  Supplementation added.  Baseline hemoglobin appears to be around 11, dropped to 9.5 today.  Check stool for occult blood and ferritin and iron levels.     Stage 3b CKD Creatinine appears to be at baseline.    Chronic atrial fibrillation Rate controlled with amiodarone and metoprolol and on eliquis for anti coagulation.   Essential hypertension;  BP med on hold for soft BP.  Restart them in am.    H/O copd;  Pt reports not using any inhalers or nebs.  No wheezing heard.  Elevated lactic acid Normalized with fluids.   Mild thrombocytopenia No bleeding episodes.     Estimated body mass index is 28.74 kg/m as calculated from the following:   Height as of this encounter: 5\' 8"  (1.727 m).   Weight as of this encounter: 85.7 kg.  Code  Status: full code.  DVT Prophylaxis:  SCDs Start: 09/03/23 1956 apixaban (ELIQUIS) tablet 5 mg   Level of Care: Level of care: Telemetry Family Communication: none at bedside.   Disposition Plan:     Remains inpatient appropriate:  IV antibiotics for atleast 48 hours.   Procedures:  None.   Consultants:   None.   Antimicrobials:   Anti-infectives (From admission, onward)    Start     Dose/Rate Route Frequency Ordered Stop   09/05/23 1600  levofloxacin (LEVAQUIN) IVPB 750 mg        750 mg 100 mL/hr over 90 Minutes Intravenous Every 48 hours 09/03/23 1731     09/05/23 1500  vancomycin (VANCOREADY) IVPB 1500 mg/300 mL        1,500 mg 150 mL/hr over 120 Minutes Intravenous Every 48 hours 09/03/23 1733     09/03/23 1600  vancomycin (VANCOCIN) IVPB 1000 mg/200 mL premix  Status:  Discontinued        1,000 mg 200 mL/hr over 60 Minutes Intravenous  Once 09/03/23 1547 09/03/23 1553  09/03/23 1600  levofloxacin (LEVAQUIN) IVPB 750 mg        750 mg 100 mL/hr over 90 Minutes Intravenous  Once 09/03/23 1547 09/03/23 1928   09/03/23 1600  vancomycin (VANCOREADY) IVPB 1750 mg/350 mL        1,750 mg 175 mL/hr over 120 Minutes Intravenous  Once 09/03/23 1553 09/03/23 1929        Medications  Scheduled Meds:  amiodarone  100 mg Oral Daily   apixaban  5 mg Oral BID   vitamin B-12  1,000 mcg Oral Daily   mometasone-formoterol  2 puff Inhalation BID   umeclidinium bromide  1 puff Inhalation Daily   Continuous Infusions:  acetaminophen 1,000 mg (09/04/23 0527)   [START ON 09/05/2023] levofloxacin (LEVAQUIN) IV     [START ON 09/05/2023] vancomycin     PRN Meds:.ondansetron **OR** ondansetron (ZOFRAN) IV    Subjective:   Courtney Berry was seen and examined today.  She reports having intermittent episodes of redness and leg pain.   Objective:   Vitals:   09/04/23 0514 09/04/23 0600 09/04/23 0900 09/04/23 0957  BP:  (!) 126/44 (!) 130/50   Pulse:  62 60   Resp:  19 18    Temp: 98.5 F (36.9 C)   98.1 F (36.7 C)  TempSrc: Oral   Oral  SpO2:  94% 93%   Weight:      Height:        Intake/Output Summary (Last 24 hours) at 09/04/2023 1207 Last data filed at 09/04/2023 0048 Gross per 24 hour  Intake 606.67 ml  Output --  Net 606.67 ml   Filed Weights   09/03/23 1522  Weight: 85.7 kg     Exam General exam: Appears calm and comfortable  Respiratory system: Clear to auscultation. Respiratory effort normal. Cardiovascular system: S1 & S2 heard, RRR.  Gastrointestinal system: Abdomen is nondistended, soft and nontender.  Central nervous system: Alert and oriented. Extremities: lower extremity chronic dependent dermatitis, tenderness and erythema present Skin: No rashes, lesions or ulcers Psychiatry: mood is appropriate.    Data Reviewed:  I have personally reviewed following labs and imaging studies   CBC Lab Results  Component Value Date   WBC 20.4 (H) 09/04/2023   RBC 2.96 (L) 09/04/2023   HGB 9.5 (L) 09/04/2023   HCT 30.6 (L) 09/04/2023   MCV 103.4 (H) 09/04/2023   MCH 32.1 09/04/2023   PLT 135 (L) 09/04/2023   MCHC 31.0 09/04/2023   RDW 12.3 09/04/2023   LYMPHSABS 1.2 09/03/2023   MONOABS 0.8 09/03/2023   EOSABS 0.3 09/03/2023   BASOSABS 0.0 09/03/2023     Last metabolic panel Lab Results  Component Value Date   NA 137 09/03/2023   K 4.4 09/03/2023   CL 102 09/03/2023   CO2 26 09/03/2023   BUN 21 09/03/2023   CREATININE 1.41 (H) 09/04/2023   GLUCOSE 172 (H) 09/03/2023   GFRNONAA 36 (L) 09/04/2023   GFRAA 60 08/29/2020   CALCIUM 8.6 (L) 09/03/2023   PHOS 3.3 09/04/2023   PROT 7.3 09/03/2023   ALBUMIN 3.2 (L) 09/03/2023   LABGLOB 3.3 06/04/2023   AGRATIO 0.9 (L) 02/12/2023   BILITOT 0.7 09/03/2023   ALKPHOS 104 09/03/2023   AST 20 09/03/2023   ALT 14 09/03/2023   ANIONGAP 9 09/03/2023    CBG (last 3)  No results for input(s): "GLUCAP" in the last 72 hours.    Coagulation Profile: Recent Labs  Lab  09/03/23 1609  INR 1.3*  Radiology Studies: DG Chest Portable 1 View  Result Date: 09/03/2023 CLINICAL DATA:  Cough EXAM: PORTABLE CHEST 1 VIEW COMPARISON:  Chest x-ray May 25, 24. FINDINGS: Enlarged cardiac silhouette, similar. Both lungs are clear. No visible pleural effusions or pneumothorax. No acute osseous abnormality. IMPRESSION: 1. No active disease. 2. Chronic cardiomegaly. Electronically Signed   By: Feliberto Harts M.D.   On: 09/03/2023 16:58       Kathlen Mody M.D. [facility] 09/04/2023, 12:07 PM  Available via Epic secure chat 7am-7pm After 7 pm, please refer to night coverage provider listed on amion.

## 2023-09-04 NOTE — Progress Notes (Signed)
   09/04/23 1104  TOC Brief Assessment  Insurance and Status Reviewed  Patient has primary care physician Yes  Home environment has been reviewed from home  Prior level of function: independent  Prior/Current Home Services No current home services  Social Determinants of Health Reivew SDOH reviewed no interventions necessary  Readmission risk has been reviewed Yes  Transition of care needs no transition of care needs at this time    Transition of Care Department [facility]) has reviewed patient and no TOC needs have been identified at this time. We will continue to monitor patient advancement through interdisciplinary progression rounds. If new patient transition needs arise, please place a TOC consult.

## 2023-09-04 NOTE — Plan of Care (Signed)
[facility] Tonsillectomy/Adenoidectomy, Postoperative PEDS care plan entered in error.

## 2023-09-04 NOTE — Progress Notes (Signed)
4098 Patient declined Elwin Sleight and Incruse she is not clear why she needs the medications( does not use anything at home) and would like to speak with doctor about them.

## 2023-09-05 DIAGNOSIS — R6520 Severe sepsis without septic shock: Secondary | ICD-10-CM | POA: Diagnosis not present

## 2023-09-05 DIAGNOSIS — A419 Sepsis, unspecified organism: Secondary | ICD-10-CM | POA: Diagnosis not present

## 2023-09-05 LAB — BASIC METABOLIC PANEL
Anion gap: 6 (ref 5–15)
BUN: 17 mg/dL (ref 8–23)
CO2: 30 mmol/L (ref 22–32)
Calcium: 8.3 mg/dL — ABNORMAL LOW (ref 8.9–10.3)
Chloride: 103 mmol/L (ref 98–111)
Creatinine, Ser: 1.25 mg/dL — ABNORMAL HIGH (ref 0.44–1.00)
GFR, Estimated: 41 mL/min — ABNORMAL LOW (ref >60)
Glucose, Bld: 91 mg/dL (ref 70–99)
Potassium: 4.5 mmol/L (ref 3.5–5.1)
Sodium: 139 mmol/L (ref 135–145)

## 2023-09-05 LAB — CBC WITH DIFFERENTIAL/PLATELET
Abs Immature Granulocytes: 0.03 10*3/uL (ref 0.00–0.07)
Basophils Absolute: 0 10*3/uL (ref 0.0–0.1)
Basophils Relative: 0 %
Eosinophils Absolute: 0.4 10*3/uL (ref 0.0–0.5)
Eosinophils Relative: 5 %
HCT: 32 % — ABNORMAL LOW (ref 36.0–46.0)
Hemoglobin: 9.7 g/dL — ABNORMAL LOW (ref 12.0–15.0)
Immature Granulocytes: 0 %
Lymphocytes Relative: 19 %
Lymphs Abs: 1.8 10*3/uL (ref 0.7–4.0)
MCH: 31.5 pg (ref 26.0–34.0)
MCHC: 30.3 g/dL (ref 30.0–36.0)
MCV: 103.9 fL — ABNORMAL HIGH (ref 80.0–100.0)
Monocytes Absolute: 0.9 10*3/uL (ref 0.1–1.0)
Monocytes Relative: 9 %
Neutro Abs: 6.1 10*3/uL (ref 1.7–7.7)
Neutrophils Relative %: 67 %
Platelets: 126 10*3/uL — ABNORMAL LOW (ref 150–400)
RBC: 3.08 MIL/uL — ABNORMAL LOW (ref 3.87–5.11)
RDW: 12.2 % (ref 11.5–15.5)
WBC: 9.3 10*3/uL (ref 4.0–10.5)
nRBC: 0 % (ref 0.0–0.2)

## 2023-09-05 LAB — IRON AND TIBC
Iron: 26 ug/dL — ABNORMAL LOW (ref 28–170)
Saturation Ratios: 11 % (ref 10.4–31.8)
TIBC: 238 ug/dL — ABNORMAL LOW (ref 250–450)
UIBC: 212 ug/dL

## 2023-09-05 LAB — FERRITIN: Ferritin: 59 ng/mL (ref 11–307)

## 2023-09-05 MED ORDER — AMLODIPINE BESYLATE 5 MG PO TABS
5.0000 mg | ORAL_TABLET | Freq: Every day | ORAL | Status: DC
  Administered 2023-09-05 – 2023-09-06 (×2): 5 mg via ORAL
  Filled 2023-09-05 (×2): qty 1

## 2023-09-05 MED ORDER — SODIUM CHLORIDE 0.9 % IV SOLN
1.0000 g | INTRAVENOUS | Status: DC
  Administered 2023-09-05: 1 g via INTRAVENOUS
  Filled 2023-09-05: qty 10

## 2023-09-05 MED ORDER — FERROUS SULFATE 325 (65 FE) MG PO TABS
325.0000 mg | ORAL_TABLET | Freq: Every day | ORAL | Status: DC
  Administered 2023-09-06: 325 mg via ORAL
  Filled 2023-09-05: qty 1

## 2023-09-05 MED ORDER — IRBESARTAN 150 MG PO TABS
150.0000 mg | ORAL_TABLET | Freq: Every day | ORAL | Status: DC
  Administered 2023-09-05 – 2023-09-06 (×2): 150 mg via ORAL
  Filled 2023-09-05 (×2): qty 1

## 2023-09-05 NOTE — Progress Notes (Signed)
[facility]                                                                               Courtney Berry, is a 89 y.o. female, DOB - December 14, 1932, DGU:[id number] Admit date - 09/03/2023    Outpatient Primary MD for the patient is Milian, Aleen Campi, FNP  LOS - 2  days    Brief summary   Courtney Berry is a 89 y.o. female with medical history significant of Atrial fibrillation on Eliquis, hypertension, CKD 3B, COPD who presents to the emergency department due to 4-day onset of feeling sick and this became worse due to increased vomiting and shakiness.  She endorsed baseline history of lymphedema, but this has resulted in increased swelling, redness and tenderness in her legs within the last 24 hours.   Assessment & Plan    Assessment and Plan:  1)Severe sepsis secondary to lower extremity cellulitis  Fever, leukocytosis, cellulitis and lactic acid >2 on admission.  As per pt erythema and tenderness improving -De-escalate antibiotics to Rocephin from Levaquin -Continue vancomycin  2)Macrocytic anemia: --- Low normal B 12 Levels. --MCV 103.9 -Give B12 supplements -Borderline low low serum iron noted -Ferritin is not -Hgb 9.7 was 9.5 yesterday -Start iron supplementation -Consider outpatient GI follow-up  3)CKD stage -3B -Renal Function appears to be close to baseline at this time renally adjust medications, avoid nephrotoxic agents / dehydration  / hypotension   4)Chronic atrial fibrillation Rate controlled with amiodarone and metoprolol and on eliquis for anti coagulation.   5)HTN-- -BP trending higher okay to restart amlodipine, metoprolol and losartan  Estimated body mass index is 35.04 kg/m as calculated from the following:   Height as of this encounter: 5\' 5"  (1.651 m).   Weight as of this encounter: 95.5 kg.  Code Status: full code.  DVT Prophylaxis:  SCDs Start: 09/03/23 1956 apixaban (ELIQUIS) tablet 5 mg   Level of Care: Level of care:  Telemetry Family Communication: none at bedside.   Disposition Plan:     Remains inpatient appropriate:      Antimicrobials:   Anti-infectives (From admission, onward)    Start     Dose/Rate Route Frequency Ordered Stop   09/05/23 2200  cefTRIAXone (ROCEPHIN) 1 g in sodium chloride 0.9 % 100 mL IVPB        1 g 200 mL/hr over 30 Minutes Intravenous Every 24 hours 09/05/23 1010     09/05/23 1600  levofloxacin (LEVAQUIN) IVPB 750 mg  Status:  Discontinued        750 mg 100 mL/hr over 90 Minutes Intravenous Every 48 hours 09/03/23 1731 09/05/23 1010   09/05/23 1500  vancomycin (VANCOREADY) IVPB 1500 mg/300 mL        1,500 mg 150 mL/hr over 120 Minutes Intravenous Every 48 hours 09/03/23 1733     09/03/23 1600  vancomycin (VANCOCIN) IVPB 1000 mg/200 mL premix  Status:  Discontinued        1,000 mg 200 mL/hr over 60 Minutes Intravenous  Once 09/03/23 1547 09/03/23 1553   09/03/23 1600  levofloxacin (LEVAQUIN) IVPB 750 mg        750 mg 100 mL/hr over 90  Minutes Intravenous  Once 09/03/23 1547 09/03/23 1928   09/03/23 1600  vancomycin (VANCOREADY) IVPB 1750 mg/350 mL        1,750 mg 175 mL/hr over 120 Minutes Intravenous  Once 09/03/23 1553 09/03/23 1929       Medications  Scheduled Meds:  amiodarone  100 mg Oral Daily   amLODipine  5 mg Oral Daily   apixaban  5 mg Oral BID   vitamin B-12  1,000 mcg Oral Daily   irbesartan  150 mg Oral Daily   metoprolol tartrate  12.5 mg Oral BID   Continuous Infusions:  cefTRIAXone (ROCEPHIN)  IV     vancomycin 1,500 mg (09/05/23 1417)   PRN Meds:.acetaminophen, ondansetron **OR** ondansetron (ZOFRAN) IV, traMADol   Subjective:   Courtney Berry was seen and examined today.   - As per Patient bilateral leg tenderness improving No fever  Or chills   Objective:   Vitals:   09/05/23 0014 09/05/23 0027 09/05/23 0405 09/05/23 1423  BP: (!) 141/39 (!) 136/52 (!) 146/57 (!) 149/60  Pulse: 73  74 66  Resp: 18  18 18   Temp: 98.3 F  (36.8 C)  98.6 F (37 C) 98.5 F (36.9 C)  TempSrc: Oral   Oral  SpO2: 93%  92% 95%  Weight:      Height:        Intake/Output Summary (Last 24 hours) at 09/05/2023 1808 Last data filed at 09/05/2023 1500 Gross per 24 hour  Intake 587.08 ml  Output --  Net 587.08 ml   Filed Weights   09/03/23 1522 09/04/23 1453  Weight: 85.7 kg 95.5 kg    Physical Exam  Gen:- Awake Alert, in no acute distress  HEENT:- Courtney Berry.AT, No sclera icterus Neck-Supple Neck,No JVD,.  Lungs-  CTAB , fair air movement bilaterally  CV- S1, S2 normal, RRR Abd-  +ve B.Sounds, Abd Soft, No tenderness,    Extremity/Skin:-good pedal pulses , bilateral lower extremity edema, erythema, warmth, no open draining wounds, as per patient tenderness is improving-please see photos in epic Psych-affect is appropriate, oriented x3 Neuro-generalized weakness, no new focal deficits, no tremors   Data Reviewed:  I have personally reviewed following labs and imaging studies   CBC Lab Results  Component Value Date   WBC 9.3 09/05/2023   RBC 3.08 (L) 09/05/2023   HGB 9.7 (L) 09/05/2023   HCT 32.0 (L) 09/05/2023   MCV 103.9 (H) 09/05/2023   MCH 31.5 09/05/2023   PLT 126 (L) 09/05/2023   MCHC 30.3 09/05/2023   RDW 12.2 09/05/2023   LYMPHSABS 1.8 09/05/2023   MONOABS 0.9 09/05/2023   EOSABS 0.4 09/05/2023   BASOSABS 0.0 09/05/2023    Last metabolic panel Lab Results  Component Value Date   NA 139 09/05/2023   K 4.5 09/05/2023   CL 103 09/05/2023   CO2 30 09/05/2023   BUN 17 09/05/2023   CREATININE 1.25 (H) 09/05/2023   GLUCOSE 91 09/05/2023   GFRNONAA 41 (L) 09/05/2023   GFRAA 60 08/29/2020   CALCIUM 8.3 (L) 09/05/2023   PHOS 3.3 09/04/2023   PROT 7.3 09/03/2023   ALBUMIN 3.2 (L) 09/03/2023   LABGLOB 3.3 06/04/2023   AGRATIO 0.9 (L) 02/12/2023   BILITOT 0.7 09/03/2023   ALKPHOS 104 09/03/2023   AST 20 09/03/2023   ALT 14 09/03/2023   ANIONGAP 6 09/05/2023   Coagulation Profile: Recent Labs  Lab  09/03/23 1609  INR 1.3*    Normand Damron M.D. [facility] 09/05/2023, 6:08 PM  Available via Epic secure chat 7am-7pm After 7 pm, please refer to night coverage provider listed on amion.

## 2023-09-05 NOTE — Plan of Care (Signed)

## 2023-09-06 DIAGNOSIS — A419 Sepsis, unspecified organism: Secondary | ICD-10-CM | POA: Diagnosis not present

## 2023-09-06 DIAGNOSIS — R6520 Severe sepsis without septic shock: Secondary | ICD-10-CM | POA: Diagnosis not present

## 2023-09-06 MED ORDER — FERROUS SULFATE 325 (65 FE) MG PO TABS: 325.0000 mg | ORAL_TABLET | Freq: Every day | ORAL | 3 refills | Status: DC

## 2023-09-06 MED ORDER — CYANOCOBALAMIN 1000 MCG PO TABS: 1000.0000 ug | ORAL_TABLET | Freq: Every day | ORAL | 2 refills | Status: DC

## 2023-09-06 MED ORDER — TORSEMIDE 20 MG PO TABS: 20.0000 mg | ORAL_TABLET | Freq: Every day | ORAL | 0 refills | Status: DC

## 2023-09-06 MED ORDER — LINEZOLID 600 MG PO TABS
600.0000 mg | ORAL_TABLET | Freq: Once | ORAL | Status: AC
  Administered 2023-09-06: 600 mg via ORAL
  Filled 2023-09-06: qty 1

## 2023-09-06 MED ORDER — DOXYCYCLINE HYCLATE 100 MG PO TABS: 100.0000 mg | ORAL_TABLET | Freq: Once | ORAL | Status: DC

## 2023-09-06 MED ORDER — LINEZOLID 600 MG PO TABS: 600.0000 mg | ORAL_TABLET | Freq: Two times a day (BID) | ORAL | 0 refills | Status: AC

## 2023-09-06 NOTE — Progress Notes (Signed)
Transition of Care [facility]) - Inpatient Brief Assessment   Patient Details  Name: CHAITRA NESTER MRN: [id number] Date of Birth: 12-05-1932  [facility]) CM/SW Contact:    Lesley Atkin A Bayard More, RN Phone Number: 09/06/2023, 10:46 AM   Clinical Narrative: Admitted with Sepsis. Patient assessed.  Planning to discharge back home today. TOC needs have not been identified at this time. We will continue to monitor patient advancement through interdisciplinary progression rounds. If new patient transition needs arise, please place a TOC consult. TOC signing off at this time.   Transition of Care Asessment: Insurance and Status: Insurance coverage has been reviewed Patient has primary care physician: Yes Home environment has been reviewed: from home Prior level of function:: independent Prior/Current Home Services: No current home services Social Determinants of Health Reivew: SDOH reviewed no interventions necessary Readmission risk has been reviewed: Yes Transition of care needs: no transition of care needs at this time

## 2023-09-06 NOTE — Plan of Care (Signed)

## 2023-09-06 NOTE — Discharge Instructions (Signed)
1)Very low-salt diet advised 2)Weigh yourself daily, call if you gain more than 3 pounds in 1 day or more than 5 pounds in 1 week as your diuretic medications may need to be adjusted 3)Please note that there has been several changes to her medications 4)Use TEDs/compression stockings as advised 5)Repeat CBC and BMP blood test within a week

## 2023-09-06 NOTE — Discharge Summary (Signed)
Courtney Berry, is a 89 y.o. female  DOB [birth year]  MRN [id number].  Admission date:  09/03/2023  Admitting Physician  Frankey Shown, DO  Discharge Date:  09/06/2023   Primary MD  Arrie Senate, FNP  Recommendations for primary care physician for things to follow:   1)Very low-salt diet advised 2)Weigh yourself daily, call if you gain more than 3 pounds in 1 day or more than 5 pounds in 1 week as your diuretic medications may need to be adjusted 3)Please note that there has been several changes to her medications 4)Use TEDs/compression stockings as advised 5)Repeat CBC and BMP blood test within a week  Admission Diagnosis  Severe sepsis (HCC) [A41.9, R65.20]   Discharge Diagnosis  Severe sepsis (HCC) [A41.9, R65.20]    Principal Problem:   Severe sepsis (HCC) Active Problems:   Essential hypertension   Chronic obstructive pulmonary disease (HCC)   Nausea & vomiting   CKD (chronic kidney disease) stage 3, GFR 30-59 ml/min (HCC)   Cellulitis of lower extremity   Atrial fibrillation, chronic (HCC)   Macrocytic anemia   Hyperglycemia   Lactic acidosis      Past Medical History:  Diagnosis Date   CKD (chronic kidney disease) stage 3, GFR 30-59 ml/min (HCC)    COPD (chronic obstructive pulmonary disease) (HCC)    Diverticulosis    Essential hypertension    History of chest pain    Low risk Cardiolite 2010   Lymphedema    Mixed hyperlipidemia    PAF (paroxysmal atrial fibrillation) (HCC)    Rheumatoid arthritis (HCC)     Past Surgical History:  Procedure Laterality Date   TOTAL ABDOMINAL HYSTERECTOMY  11/18/1967     HPI  from the history and physical done on the day of admission:   HPI: Courtney Berry is a 89 y.o. female with medical history significant of Atrial fibrillation on Eliquis, hypertension, CKD 3B, COPD who presents to the emergency department due to 4-day onset of  feeling sick and this became worse due to increased vomiting and shakiness.  She endorsed baseline history of lymphedema, but this has resulted in increased swelling, redness and tenderness in her legs within the last 24 hours.  Nonbilious vomiting and fever started today.  She has not taken any medication prior to presenting to the ED and she denies any sick contact.   ED Course:  In the emergency department, patient was febrile with a temperature of 102.2, other vital signs are within normal range.  Workup in the ED showed leukocytosis, macrocytic anemia.  BMP was normal except for blood glucose of 172, creatinine 1.41 (creatinine is within baseline range).  Lactic acid 2.2 > 2.6.  Influenza A, B, SARS coronavirus 2, RSV was negative.  Blood culture pending. Chest x-ray showed no active disease. Patient was started on vancomycin and Levaquin (patient has penicillin allergy).  Tylenol was given due to fever.  IV hydration was provided. Hospitalist was asked to admit patient for further evaluation and management.   Review of Systems: Review of systems as noted  in the HPI. All other systems reviewed and are negative.     Hospital Course:   1)Severe sepsis secondary to lower extremity cellulitis  Fever, leukocytosis, cellulitis and lactic acid >2 on admission.  As per pt erythema and tenderness improving -De-escalate antibiotics to Rocephin from Levaquin -Continue vancomycin   2)Macrocytic anemia: --- Low normal B 12 Levels. --MCV 103.9 -Give B12 supplements -Borderline low low serum iron noted -Ferritin is not -Hgb 9.7 was 9.5 yesterday -Started iron supplementation -Patient is 89 years old and she would like to defer to GI involvement/endoluminal evaluation   3)CKD stage -3B -Renal Function appears to be close to baseline at this time renally adjust medications, avoid nephrotoxic agents / dehydration  / hypotension     4)Chronic atrial fibrillation Rate controlled with amiodarone  and metoprolol and on eliquis for anti coagulation.    5)HTN-- -BP improved after restarting amlodipine, metoprolol and losartan  6)Class 2 Obesity- -Low calorie diet, portion control and increase physical activity discussed with patient -Body mass index is 35.04 kg/m.   Discharge Condition: ***  Follow UP   Follow-up Information     Milian, Aleen Campi, FNP. Schedule an appointment as soon as possible for a visit in 1 week(s).   Specialty: Family Medicine Why: Repeat CBC and BMP blood test in 1 week Contact information: [address] [place] [address] [phone]                  Consults obtained - ***  Diet and Activity recommendation:  As advised  Discharge Instructions    **** Discharge Instructions     Call MD for:  difficulty breathing, headache or visual disturbances   Complete by: As directed    Call MD for:  persistant dizziness or light-headedness   Complete by: As directed    Call MD for:  persistant nausea and vomiting   Complete by: As directed    Call MD for:  temperature >100.4   Complete by: As directed    Diet - low sodium heart healthy   Complete by: As directed    Discharge instructions   Complete by: As directed    1)Very low-salt diet advised 2)Weigh yourself daily, call if you gain more than 3 pounds in 1 day or more than 5 pounds in 1 week as your diuretic medications may need to be adjusted 3)Please note that there has been several changes to her medications 4)Use TEDs/compression stockings as advised 5)Repeat CBC and BMP blood test within a week   Increase activity slowly   Complete by: As directed          Discharge Medications     Allergies as of 09/06/2023       Reactions   Clopidogrel Bisulfate Swelling   Face swells   Darvocet [propoxyphene N-acetaminophen] Other (See Comments)   unknown   Doxycycline Itching   Lasix [furosemide]    Nitrofurantoin Monohyd Macro Other (See Comments)   unknown    Plavix [clopidogrel Bisulfate] Swelling   Face swells   Sulfa Antibiotics Other (See Comments)   "face got puffy"   Amitriptyline Other (See Comments)   Made her "talk out of her head" and sleep until lunch the next day.   Gabapentin Other (See Comments)   Made her "talk out of her head".   Omnicef [cefdinir] Nausea And Vomiting, Rash   Penicillins Rash   immediate rash, facial/tongue/throat swelling, SOB or lightheadedness with hypotension  Medication List     TAKE these medications    acetaminophen 500 MG tablet Commonly known as: TYLENOL Take 1,000 mg by mouth every 6 (six) hours as needed for moderate pain.   amiodarone 200 MG tablet Commonly known as: PACERONE TAKE 1/2 TABLET BY MOUTH DAILY   amLODipine 5 MG tablet Commonly known as: NORVASC TAKE 1 TABLET (5 MG TOTAL) BY MOUTH DAILY.   apixaban 5 MG Tabs tablet Commonly known as: ELIQUIS Take 1 tablet (5 mg total) by mouth 2 (two) times daily.   cyanocobalamin 1000 MCG tablet Take 1 tablet (1,000 mcg total) by mouth daily. Start taking on: September 07, 2023   ferrous sulfate 325 (65 FE) MG tablet Take 1 tablet (325 mg total) by mouth daily with breakfast. Start taking on: September 07, 2023   linezolid 600 MG tablet Commonly known as: ZYVOX Take 1 tablet (600 mg total) by mouth 2 (two) times daily for 5 days.   metoprolol tartrate 25 MG tablet Commonly known as: LOPRESSOR TAKE 1/2 TABLET TWICE A DAY BY MOUTH   olmesartan 20 MG tablet Commonly known as: Benicar Take 1 tablet (20 mg total) by mouth daily.   PROBIOTIC DAILY PO Take 1 tablet by mouth daily.   torsemide 20 MG tablet Commonly known as: DEMADEX Take 1 tablet (20 mg total) by mouth daily. For Fluid        Major procedures and Radiology Reports - PLEASE review detailed and final reports for all details, in brief -   ***  DG Chest Portable 1 View  Result Date: 09/03/2023 CLINICAL DATA:  Cough EXAM: PORTABLE CHEST 1 VIEW  COMPARISON:  Chest x-ray May 25, 24. FINDINGS: Enlarged cardiac silhouette, similar. Both lungs are clear. No visible pleural effusions or pneumothorax. No acute osseous abnormality. IMPRESSION: 1. No active disease. 2. Chronic cardiomegaly. Electronically Signed   By: Feliberto Harts M.D.   On: 09/03/2023 16:58    Micro Results   *** Recent Results (from the past 240 hour(s))  SARS Coronavirus 2 by RT PCR (hospital order, performed in [facility] hospital lab) *cepheid single result test* Anterior Nasal Swab     Status: None   Collection Time: 09/03/23  3:20 PM   Specimen: Anterior Nasal Swab  Result Value Ref Range Status   SARS Coronavirus 2 by RT PCR NEGATIVE NEGATIVE Final    Comment: (NOTE) SARS-CoV-2 target nucleic acids are NOT DETECTED.  The SARS-CoV-2 RNA is generally detectable in upper and lower respiratory specimens during the acute phase of infection. The lowest concentration of SARS-CoV-2 viral copies this assay can detect is 250 copies / mL. A negative result does not preclude SARS-CoV-2 infection and should not be used as the sole basis for treatment or other patient management decisions.  A negative result may occur with improper specimen collection / handling, submission of specimen other than nasopharyngeal swab, presence of viral mutation(s) within the areas targeted by this assay, and inadequate number of viral copies (<250 copies / mL). A negative result must be combined with clinical observations, patient history, and epidemiological information.  Fact Sheet for Patients:   RoadLapTop.co.za  Fact Sheet for Healthcare Providers: http://kim-miller.com/  This test is not yet approved or  cleared by the [place] FDA and has been authorized for detection and/or diagnosis of SARS-CoV-2 by FDA under an Emergency Use Authorization (EUA).  This EUA will remain in effect (meaning this test can be used) for the  duration of the COVID-19 declaration under Section  564(b)(1) of the Act, 21 U.S.C. section 360bbb-3(b)(1), unless the authorization is terminated or revoked sooner.  Performed at [facility], [address] [place], [place] [address]   Resp panel by RT-PCR (RSV, Flu A&B, Covid) Anterior Nasal Swab     Status: None   Collection Time: 09/03/23  3:46 PM   Specimen: Anterior Nasal Swab  Result Value Ref Range Status   SARS Coronavirus 2 by RT PCR NEGATIVE NEGATIVE Final    Comment: (NOTE) SARS-CoV-2 target nucleic acids are NOT DETECTED.  The SARS-CoV-2 RNA is generally detectable in upper respiratory specimens during the acute phase of infection. The lowest concentration of SARS-CoV-2 viral copies this assay can detect is 138 copies/mL. A negative result does not preclude SARS-Cov-2 infection and should not be used as the sole basis for treatment or other patient management decisions. A negative result may occur with  improper specimen collection/handling, submission of specimen other than nasopharyngeal swab, presence of viral mutation(s) within the areas targeted by this assay, and inadequate number of viral copies(<138 copies/mL). A negative result must be combined with clinical observations, patient history, and epidemiological information. The expected result is Negative.  Fact Sheet for Patients:  BloggerCourse.com  Fact Sheet for Healthcare Providers:  SeriousBroker.it  This test is no t yet approved or cleared by the [place] FDA and  has been authorized for detection and/or diagnosis of SARS-CoV-2 by FDA under an Emergency Use Authorization (EUA). This EUA will remain  in effect (meaning this test can be used) for the duration of the COVID-19 declaration under Section 564(b)(1) of the Act, 21 U.S.C.section 360bbb-3(b)(1), unless the authorization is terminated  or revoked sooner.       Influenza A by PCR NEGATIVE  NEGATIVE Final   Influenza B by PCR NEGATIVE NEGATIVE Final    Comment: (NOTE) The Xpert Xpress SARS-CoV-2/FLU/RSV plus assay is intended as an aid in the diagnosis of influenza from Nasopharyngeal swab specimens and should not be used as a sole basis for treatment. Nasal washings and aspirates are unacceptable for Xpert Xpress SARS-CoV-2/FLU/RSV testing.  Fact Sheet for Patients: BloggerCourse.com  Fact Sheet for Healthcare Providers: SeriousBroker.it  This test is not yet approved or cleared by the [place] FDA and has been authorized for detection and/or diagnosis of SARS-CoV-2 by FDA under an Emergency Use Authorization (EUA). This EUA will remain in effect (meaning this test can be used) for the duration of the COVID-19 declaration under Section 564(b)(1) of the Act, 21 U.S.C. section 360bbb-3(b)(1), unless the authorization is terminated or revoked.     Resp Syncytial Virus by PCR NEGATIVE NEGATIVE Final    Comment: (NOTE) Fact Sheet for Patients: BloggerCourse.com  Fact Sheet for Healthcare Providers: SeriousBroker.it  This test is not yet approved or cleared by the [place] FDA and has been authorized for detection and/or diagnosis of SARS-CoV-2 by FDA under an Emergency Use Authorization (EUA). This EUA will remain in effect (meaning this test can be used) for the duration of the COVID-19 declaration under Section 564(b)(1) of the Act, 21 U.S.C. section 360bbb-3(b)(1), unless the authorization is terminated or revoked.  Performed at [facility], [address], [place], [place] [address]   Blood Culture (routine x 2)     Status: None (Preliminary result)   Collection Time: 09/03/23  4:09 PM   Specimen: BLOOD  Result Value Ref Range Status   Specimen Description BLOOD RIGHT ANTECUBITAL  Final   Special Requests   Final    BOTTLES DRAWN  AEROBIC AND  ANAEROBIC Blood Culture adequate volume   Culture   Final    NO GROWTH 3 DAYS Performed at [facility], [address] [place], [place] [address]    Report Status PENDING  Incomplete  Blood Culture (routine x 2)     Status: None (Preliminary result)   Collection Time: 09/03/23  4:09 PM   Specimen: BLOOD  Result Value Ref Range Status   Specimen Description BLOOD LEFT ANTECUBITAL BOTTLES DRAWN AEROBIC ONLY  Final   Special Requests   Final    Blood Culture results may not be optimal due to an excessive volume of blood received in culture bottles   Culture   Final    NO GROWTH 3 DAYS Performed at [facility], [address] [place], [place] [address]    Report Status PENDING  Incomplete    Today   Subjective    Murjani Elman today has no ***         -Husband at bedside, questions answered   Patient has been seen and examined prior to discharge   Objective   Blood pressure (!) 141/67, pulse 61, temperature 98.1 F (36.7 C), temperature source Oral, resp. rate 20, height 5\' 5"  (1.651 m), weight 95.5 kg, SpO2 96%.   Intake/Output Summary (Last 24 hours) at 09/06/2023 1256 Last data filed at 09/06/2023 1049 Gross per 24 hour  Intake 854.99 ml  Output --  Net 854.99 ml    Exam Gen:- Awake Alert, no acute distress *** HEENT:- [place].AT, No sclera icterus Neck-Supple Neck,No JVD,.  Lungs-  CTAB , good air movement bilaterally CV- S1, S2 normal, regular Abd-  +ve B.Sounds, Abd Soft, No tenderness,    Extremity/Skin:- No  edema,   good pulses Psych-affect is appropriate, oriented x3 Neuro-no new focal deficits, no tremors ***   Data Review   CBC w Diff:  Lab Results  Component Value Date   WBC 9.3 09/05/2023   HGB 9.7 (L) 09/05/2023   HGB 12.1 06/04/2023   HCT 32.0 (L) 09/05/2023   HCT 37.2 06/04/2023   PLT 126 (L) 09/05/2023   PLT 168 06/04/2023   LYMPHOPCT 19 09/05/2023   MONOPCT 9 09/05/2023   EOSPCT 5 09/05/2023   BASOPCT 0 09/05/2023    CMP:  Lab  Results  Component Value Date   NA 139 09/05/2023   NA 143 06/04/2023   K 4.5 09/05/2023   CL 103 09/05/2023   CO2 30 09/05/2023   BUN 17 09/05/2023   BUN 22 06/04/2023   CREATININE 1.25 (H) 09/05/2023   CREATININE 1.01 (H) 08/18/2016   PROT 7.3 09/03/2023   PROT 6.7 06/04/2023   ALBUMIN 3.2 (L) 09/03/2023   ALBUMIN 3.4 (L) 06/04/2023   BILITOT 0.7 09/03/2023   BILITOT 0.3 06/04/2023   ALKPHOS 104 09/03/2023   AST 20 09/03/2023   ALT 14 09/03/2023  .  Total Discharge time is about 33 minutes  Shon Hale M.D on 09/06/2023 at 12:56 PM  Go to [email or web address] -  for contact info  [facility] - Office  [phone]

## 2023-09-08 ENCOUNTER — Telehealth: Payer: Self-pay | Admitting: *Deleted

## 2023-09-08 LAB — CULTURE, BLOOD (ROUTINE X 2)
Culture: NO GROWTH
Culture: NO GROWTH
Special Requests: ADEQUATE

## 2023-09-08 NOTE — Transitions of Care (Post Inpatient/ED Visit) (Signed)
09/08/2023  Name: Courtney Berry MRN: [id number] DOB: 11/14/1933  Today's TOC FU Call Status: Today's TOC FU Call Status:: Successful TOC FU Call Completed TOC FU Call Complete Date: 09/08/23 Patient's Name and Date of Birth confirmed.  Transition Care Management Follow-up Telephone Call Date of Discharge: 09/06/23 [facility] Type of Discharge: Inpatient Admission Primary Inpatient Discharge Diagnosis:: Severe sepsis How have you been since you were released from the hospital?: Better Any questions or concerns?: No  Items Reviewed: Did you receive and understand the discharge instructions provided?: Yes Medications obtained,verified, and reconciled?: Yes (Medications Reviewed) Any new allergies since your discharge?: No Dietary orders reviewed?: No Do you have support at home?: Yes People in Home: spouse Name of Support/Comfort Primary Source: Alinda Money  Medications Reviewed Today: Medications Reviewed Today     Reviewed by Luella Cook, RN (Case Manager) on 09/08/23 at 1713  Med List Status: <None>   Medication Order Taking? Sig Documenting Provider Last Dose Status Informant  acetaminophen (TYLENOL) 500 MG tablet [id number] Yes Take 1,000 mg by mouth every 6 (six) hours as needed for moderate pain. [provider] Taking Active Self  amiodarone (PACERONE) 200 MG tablet [id number] Yes TAKE 1/2 TABLET BY MOUTH DAILY Jonelle Sidle, MD Taking Active Self  amLODipine (NORVASC) 5 MG tablet [id number] Yes TAKE 1 TABLET (5 MG TOTAL) BY MOUTH DAILY. Jonelle Sidle, MD Taking Active Self  apixaban (ELIQUIS) 5 MG TABS tablet [id number] Yes Take 1 tablet (5 mg total) by mouth 2 (two) times daily. Jonelle Sidle, MD Taking Active Self  cyanocobalamin 1000 MCG tablet [id number] Yes Take 1 tablet (1,000 mcg total) by mouth daily. Shon Hale, MD Taking Active   ferrous sulfate 325 (65 FE) MG tablet [id number] Yes Take 1 tablet (325 mg  total) by mouth daily with breakfast. Shon Hale, MD Taking Active   linezolid (ZYVOX) 600 MG tablet [id number] Yes Take 1 tablet (600 mg total) by mouth 2 (two) times daily for 5 days. Shon Hale, MD Taking Active   metoprolol tartrate (LOPRESSOR) 25 MG tablet [id number] Yes TAKE 1/2 TABLET TWICE A DAY BY MOUTH Jonelle Sidle, MD Taking Active Self  olmesartan (BENICAR) 20 MG tablet [id number] Yes Take 1 tablet (20 mg total) by mouth daily. Nyoka Cowden, MD Taking Active Self  Probiotic Product (PROBIOTIC DAILY PO) [id number] Yes Take 1 tablet by mouth daily. [provider] Taking Active Self  torsemide (DEMADEX) 20 MG tablet [id number] Yes Take 1 tablet (20 mg total) by mouth daily. For Fluid Shon Hale, MD Taking Active   Med List Note Landis Gandy 08/01/18 1152): [facility] [place] [address]) is preference when the [facility] in [place] is closed.             Home Care and Equipment/Supplies: Were Home Health Services Ordered?: NA Any new equipment or medical supplies ordered?: NA  Functional Questionnaire: Do you need assistance with bathing/showering or dressing?: No Do you need assistance with meal preparation?: Yes Do you need assistance with eating?: No Do you have difficulty maintaining continence: No Do you need assistance with getting out of bed/getting out of a chair/moving?: No Do you have difficulty managing or taking your medications?: No  Follow up appointments reviewed: PCP Follow-up appointment confirmed?: Yes Date of PCP follow-up appointment?: 09/11/23 Follow-up Provider: [facility] Follow-up appointment confirmed?: Yes Date of Specialist follow-up appointment?: 09/14/23 Follow-Up Specialty Provider:: [id number]  Gilman Buttner, [id number] Dr Sherene Sires  Do you need transportation to your follow-up appointment?: No Do you understand care options if your condition(s) worsen?: Yes-patient verbalized understanding  SDOH  Interventions Today    Flowsheet Row Most Recent Value  SDOH Interventions   Food Insecurity Interventions Intervention Not Indicated  Housing Interventions Intervention Not Indicated  Transportation Interventions Intervention Not Indicated, Patient Resources (Friends/Family)  Utilities Interventions Intervention Not Indicated      Interventions Today    Flowsheet Row Most Recent Value  General Interventions   General Interventions Discussed/Reviewed General Interventions Discussed, General Interventions Reviewed, Doctor Visits  Doctor Visits Discussed/Reviewed Doctor Visits Discussed, Doctor Visits Reviewed  Nutrition Interventions   Nutrition Discussed/Reviewed Nutrition Discussed, Decreasing salt  Pharmacy Interventions   Pharmacy Dicussed/Reviewed Pharmacy Topics Discussed, Pharmacy Topics Reviewed      TOC Interventions Today    Flowsheet Row Most Recent Value  TOC Interventions   TOC Interventions Discussed/Reviewed TOC Interventions Discussed, TOC Interventions Reviewed     . Patient declined further outreach from care coordination  Gean Maidens BSN RN [facility] [phone]

## 2023-09-11 ENCOUNTER — Inpatient Hospital Stay: Payer: Medicare Other | Admitting: Family Medicine

## 2023-09-14 ENCOUNTER — Ambulatory Visit: Payer: Medicare Other | Attending: Cardiology | Admitting: Cardiology

## 2023-09-14 ENCOUNTER — Encounter: Payer: Self-pay | Admitting: Cardiology

## 2023-09-14 VITALS — BP 130/64 | HR 53 | Ht 67.0 in | Wt 189.0 lb

## 2023-09-14 DIAGNOSIS — I10 Essential (primary) hypertension: Secondary | ICD-10-CM

## 2023-09-14 DIAGNOSIS — N1832 Chronic kidney disease, stage 3b: Secondary | ICD-10-CM

## 2023-09-14 DIAGNOSIS — I890 Lymphedema, not elsewhere classified: Secondary | ICD-10-CM

## 2023-09-14 DIAGNOSIS — I480 Paroxysmal atrial fibrillation: Secondary | ICD-10-CM | POA: Diagnosis not present

## 2023-09-14 MED ORDER — AMLODIPINE BESYLATE 2.5 MG PO TABS: 2.5000 mg | ORAL_TABLET | Freq: Every day | ORAL | 2 refills | Status: DC

## 2023-09-14 NOTE — Patient Instructions (Addendum)
Medication Instructions:  Your physician has recommended you make the following change in your medication:  Decrease amlodipine to 2.5 mg daily Continue all other medications as prescribed  Labwork: none  Testing/Procedures: none  Follow-Up: Your physician recommends that you schedule a follow-up appointment in: 6 months  Any Other Special Instructions Will Be Listed Below (If Applicable).  If you need a refill on your cardiac medications before your next appointment, please call your pharmacy.

## 2023-09-14 NOTE — Progress Notes (Signed)
Cardiology Office Note  Date: 09/14/2023   ID: Courtney Berry, DOB 11-10-1933, MRN [id number]  History of Present Illness: Courtney Berry is an 89 y.o. female last seen in April by Ms. Philis Nettle NP, I reviewed the note.  She is here today with her daughter for follow-up visit. Records indicate recent hospitalization in October with sepsis in the setting of lower extremity cellulitis.  She improved on antibiotic therapy.  I reviewed her medication adjustments.  Medications reviewed, current cardiac regimen includes amiodarone, Norvasc, Eliquis, Lopressor, Benicar, and Demadex.  Blood pressure has been low to low normal at home.  She does feel somewhat lightheaded when she stands up.  We discussed reducing Norvasc to 2.5 mg daily for now.  Physical Exam: VS:  BP 130/64 (BP Location: Left Arm)   Pulse (!) 53   Ht 5\' 7"  (1.702 m)   Wt 189 lb (85.7 kg)   SpO2 97%   BMI 29.60 kg/m , BMI Body mass index is 29.6 kg/m.  Wt Readings from Last 3 Encounters:  09/14/23 189 lb (85.7 kg)  09/04/23 210 lb 8.6 oz (95.5 kg)  08/06/23 197 lb (89.4 kg)    General: Patient appears comfortable at rest. HEENT: Conjunctiva and lids normal. Neck: Supple, no elevated JVP or carotid bruits. Lungs: Clear to auscultation, nonlabored breathing at rest. Cardiac: Regular rate and rhythm, no S3, 1/6 systolic murmur. Extremities: Bilateral lymphedema.  ECG:  An ECG dated 09/03/2023 was personally reviewed today and demonstrated:  Sinus rhythm with nonspecific ST changes.  Labwork: 01/06/2023: BNP 56.4 09/03/2023: ALT 14; AST 20 09/04/2023: Magnesium 1.9 09/05/2023: BUN 17; Creatinine, Ser 1.25; Hemoglobin 9.7; Platelets 126; Potassium 4.5; Sodium 139   Other Studies Reviewed Today:  No interval cardiac testing for review today.  Assessment and Plan:  1.  Persistent atrial fibrillation, CHA2DS2-VASc score of 4.  She is asymptomatic and in sinus rhythm, recent ECG reviewed.  Continue Eliquis for  stroke prophylaxis.  No change in amiodarone or Lopressor as well.  I reviewed her recent lab work.   2.  Primary hypertension.  Blood pressure low to low normal recently.  We discussed reducing Norvasc to 2.5 mg daily.  She is otherwise on Benicar.   3.  Lymphedema.  Started on low-dose Demadex recently.  Does report some mild improvement.  Would continue for now.  4.  CKD stage IIIb, creatinine 1.25 with GFR 41.  Disposition:  Follow up  6 months.  Signed, Jonelle Sidle, M.D., F.A.C.C. [facility]

## 2023-09-15 ENCOUNTER — Ambulatory Visit (INDEPENDENT_AMBULATORY_CARE_PROVIDER_SITE_OTHER): Payer: Medicare Other | Admitting: Family Medicine

## 2023-09-15 ENCOUNTER — Encounter: Payer: Self-pay | Admitting: Family Medicine

## 2023-09-15 VITALS — BP 126/61 | HR 60 | Temp 98.3°F | Ht 67.0 in | Wt 189.0 lb

## 2023-09-15 DIAGNOSIS — J449 Chronic obstructive pulmonary disease, unspecified: Secondary | ICD-10-CM

## 2023-09-15 DIAGNOSIS — I890 Lymphedema, not elsewhere classified: Secondary | ICD-10-CM

## 2023-09-15 DIAGNOSIS — N1832 Chronic kidney disease, stage 3b: Secondary | ICD-10-CM | POA: Diagnosis not present

## 2023-09-15 DIAGNOSIS — D539 Nutritional anemia, unspecified: Secondary | ICD-10-CM

## 2023-09-15 DIAGNOSIS — K1370 Unspecified lesions of oral mucosa: Secondary | ICD-10-CM

## 2023-09-15 DIAGNOSIS — R6520 Severe sepsis without septic shock: Secondary | ICD-10-CM | POA: Diagnosis not present

## 2023-09-15 DIAGNOSIS — I10 Essential (primary) hypertension: Secondary | ICD-10-CM | POA: Diagnosis not present

## 2023-09-15 DIAGNOSIS — A419 Sepsis, unspecified organism: Secondary | ICD-10-CM | POA: Diagnosis not present

## 2023-09-15 MED ORDER — CLOTRIMAZOLE 10 MG MT TROC
10.0000 mg | Freq: Every day | OROMUCOSAL | 0 refills | Status: DC
Start: 2023-09-15 — End: 2023-09-24

## 2023-09-15 NOTE — Progress Notes (Signed)
Subjective:  Patient ID: Courtney Berry, female    DOB: Feb 28, 1933, 89 y.o.   MRN: [id number]  Patient Care Team: Arrie Senate, FNP as PCP - General (Family Medicine) Jonelle Sidle, MD as PCP - Cardiology (Cardiology) Kari Baars, MD as Consulting Physician (Pulmonary Disease) Jena Gauss Gerrit Friends, MD as Consulting Physician (Gastroenterology) Sharlene Dory, NP as Nurse Practitioner (Cardiology)   Chief Complaint:  Hospitalization Follow-up (Severe sepsis)  HPI: Courtney Berry is a 89 y.o. female presenting on 09/15/2023 for Hospitalization Follow-up (Severe sepsis) Patient presents today for hospital follow up with daughter.  She was admitted 09/03/23 to [facility] for severe sepsis, cellulitis of lower extremity edema. Hospital course was complicated by COPD, Afib, CKD3, macrocytic anemia, and hyperglycemia. States that she was treated with IV antibiotics and diuretics. Reports that since coming home she has not felt energetic. She states that she is not able to walk without getting dizzy. She "gives out" while walking. Completed antibiotics - only had 5 days since she came home. She reports that she is continuing Torsemide. Reports that the cardiologist said she was doing well. Continues to see nephrology, Wolfgang Phoenix, has follow up in January 2025. She has follow up with Pulmonology 11/7. She reports that she has gotten some compression stockings, but continues to struggle to wear them.  In addition states that she has tongue pain. Believes she has thrush. Started when she got home on Sunday. States that she did have white patches coating her mouth. She believes it is due to the antibiotics she received in the hospital.    Relevant past medical, surgical, family, and social history reviewed and updated as indicated.  Allergies and medications reviewed and updated. Data reviewed: Chart in Epic.   Past Medical History:  Diagnosis Date   CKD (chronic kidney  disease) stage 3, GFR 30-59 ml/min (HCC)    COPD (chronic obstructive pulmonary disease) (HCC)    Diverticulosis    Essential hypertension    History of chest pain    Low risk Cardiolite 2010   Lymphedema    Mixed hyperlipidemia    PAF (paroxysmal atrial fibrillation) (HCC)    Rheumatoid arthritis (HCC)     Past Surgical History:  Procedure Laterality Date   TOTAL ABDOMINAL HYSTERECTOMY  11/18/1967    Social History   Socioeconomic History   Marital status: Married    Spouse name: Not on file   Number of children: 2   Years of education: Not on file   Highest education level: Not on file  Occupational History   Occupation: Retired  Tobacco Use   Smoking status: Never    Passive exposure: Never   Smokeless tobacco: Never   Tobacco comments:    Tobacco use-no  Vaping Use   Vaping status: Never Used  Substance and Sexual Activity   Alcohol use: No    Alcohol/week: 0.0 standard drinks of alcohol   Drug use: No   Sexual activity: Not on file  Other Topics Concern   Not on file  Social History Narrative   Lives with her husband - one level living, handicap accessible home - Son and daughter both live very close by.   Social Determinants of Health   Financial Resource Strain: Low Risk  (03/13/2023)   Overall Financial Resource Strain (CARDIA)    Difficulty of Paying Living Expenses: Not hard at all  Food Insecurity: No Food Insecurity (09/08/2023)   Hunger Vital Sign    Worried About  Running Out of Food in the Last Year: Never true    Ran Out of Food in the Last Year: Never true  Transportation Needs: No Transportation Needs (09/08/2023)   PRAPARE - Administrator, Civil Service (Medical): No    Lack of Transportation (Non-Medical): No  Physical Activity: Insufficiently Active (03/13/2023)   Exercise Vital Sign    Days of Exercise per Week: 3 days    Minutes of Exercise per Session: 30 min  Stress: No Stress Concern Present (03/13/2023)   [employer] of Occupational Health - Occupational Stress Questionnaire    Feeling of Stress : Not at all  Social Connections: Moderately Integrated (03/13/2023)   Social Connection and Isolation Panel [NHANES]    Frequency of Communication with Friends and Family: More than three times a week    Frequency of Social Gatherings with Friends and Family: More than three times a week    Attends Religious Services: More than 4 times per year    Active Member of [employer] or Organizations: No    Attends Banker Meetings: Never    Marital Status: Married  Catering manager Violence: Not At Risk (09/04/2023)   Humiliation, Afraid, Rape, and Kick questionnaire    Fear of Current or Ex-Partner: No    Emotionally Abused: No    Physically Abused: No    Sexually Abused: No    Outpatient Encounter Medications as of 09/15/2023  Medication Sig   acetaminophen (TYLENOL) 500 MG tablet Take 1,000 mg by mouth every 6 (six) hours as needed for moderate pain.   amiodarone (PACERONE) 200 MG tablet TAKE 1/2 TABLET BY MOUTH DAILY   amLODipine (NORVASC) 2.5 MG tablet Take 1 tablet (2.5 mg total) by mouth daily.   apixaban (ELIQUIS) 5 MG TABS tablet Take 1 tablet (5 mg total) by mouth 2 (two) times daily.   cyanocobalamin 1000 MCG tablet Take 1 tablet (1,000 mcg total) by mouth daily.   ferrous sulfate 325 (65 FE) MG tablet Take 1 tablet (325 mg total) by mouth daily with breakfast.   metoprolol tartrate (LOPRESSOR) 25 MG tablet TAKE 1/2 TABLET TWICE A DAY BY MOUTH   olmesartan (BENICAR) 20 MG tablet Take 1 tablet (20 mg total) by mouth daily.   Probiotic Product (PROBIOTIC DAILY PO) Take 1 tablet by mouth daily.   torsemide (DEMADEX) 20 MG tablet Take 1 tablet (20 mg total) by mouth daily. For Fluid   No facility-administered encounter medications on file as of 09/15/2023.    Allergies  Allergen Reactions   Clopidogrel Bisulfate Swelling    Face swells    Darvocet [Propoxyphene N-Acetaminophen]  Other (See Comments)    unknown   Doxycycline Itching   Lasix [Furosemide]    Nitrofurantoin Monohyd Macro Other (See Comments)    unknown   Plavix [Clopidogrel Bisulfate] Swelling    Face swells   Sulfa Antibiotics Other (See Comments)    "face got puffy"   Amitriptyline Other (See Comments)    Made her "talk out of her head" and sleep until lunch the next day.   Gabapentin Other (See Comments)    Made her "talk out of her head".   Omnicef [Cefdinir] Nausea And Vomiting and Rash   Penicillins Rash    immediate rash, facial/tongue/throat swelling, SOB or lightheadedness with hypotension     Review of Systems As per HPI  Objective:  BP 126/61   Pulse 60   Temp 98.3 F (36.8 C)   Ht 5\' 7"  (  1.702 m)   Wt 189 lb (85.7 kg) Comment: cardiology visit yesterday  SpO2 94%   BMI 29.60 kg/m    Wt Readings from Last 3 Encounters:  09/15/23 189 lb (85.7 kg)  09/14/23 189 lb (85.7 kg)  09/04/23 210 lb 8.6 oz (95.5 kg)    Physical Exam Constitutional:      General: She is awake. She is not in acute distress.    Appearance: Normal appearance. She is well-developed and well-groomed. She is not ill-appearing, toxic-appearing or diaphoretic.  HENT:     Mouth/Throat:      Comments: Singular white patch on upper left palate  Cardiovascular:     Rate and Rhythm: Normal rate.     Pulses: Normal pulses.          Radial pulses are 2+ on the right side and 2+ on the left side.       Posterior tibial pulses are 2+ on the right side and 2+ on the left side.     Heart sounds: Normal heart sounds. No murmur heard.    No gallop.  Pulmonary:     Effort: Pulmonary effort is normal. No respiratory distress.     Breath sounds: Normal breath sounds. No stridor. No wheezing, rhonchi or rales.  Musculoskeletal:     Cervical back: Full passive range of motion without pain and neck supple.     Right lower leg: 4+ Edema present.     Left lower leg: 4+ Edema present.     Comments: Wheelchair    Skin:    General: Skin is warm.     Capillary Refill: Capillary refill takes less than 2 seconds.     Comments: Dry, flaky, hardened skin bilateral lower extremities. Between 3rd and 4th toe on bilateral feet - skin breakdown with flaking, erythematous, granulation tissue   Neurological:     General: No focal deficit present.     Mental Status: She is alert, oriented to person, place, and time and easily aroused. Mental status is at baseline.     GCS: GCS eye subscore is 4. GCS verbal subscore is 5. GCS motor subscore is 6.     Motor: No weakness.  Psychiatric:        Attention and Perception: Attention and perception normal.        Mood and Affect: Mood and affect normal.        Speech: Speech normal.        Behavior: Behavior normal. Behavior is cooperative.        Thought Content: Thought content normal. Thought content does not include homicidal or suicidal ideation. Thought content does not include homicidal or suicidal plan.        Cognition and Memory: Cognition and memory normal.        Judgment: Judgment normal.                Results for orders placed or performed during the hospital encounter of 09/03/23  SARS Coronavirus 2 by RT PCR (hospital order, performed in [facility] hospital lab) *cepheid single result test* Anterior Nasal Swab   Specimen: Anterior Nasal Swab  Result Value Ref Range   SARS Coronavirus 2 by RT PCR NEGATIVE NEGATIVE  Resp panel by RT-PCR (RSV, Flu A&B, Covid) Anterior Nasal Swab   Specimen: Anterior Nasal Swab  Result Value Ref Range   SARS Coronavirus 2 by RT PCR NEGATIVE NEGATIVE   Influenza A by PCR NEGATIVE NEGATIVE   Influenza B by PCR  NEGATIVE NEGATIVE   Resp Syncytial Virus by PCR NEGATIVE NEGATIVE  Blood Culture (routine x 2)   Specimen: BLOOD  Result Value Ref Range   Specimen Description BLOOD RIGHT ANTECUBITAL    Special Requests      BOTTLES DRAWN AEROBIC AND ANAEROBIC Blood Culture adequate volume   Culture      NO  GROWTH 5 DAYS Performed at [facility], [address] [place], [place] [address]    Report Status 09/08/2023 FINAL   Blood Culture (routine x 2)   Specimen: BLOOD  Result Value Ref Range   Specimen Description BLOOD LEFT ANTECUBITAL BOTTLES DRAWN AEROBIC ONLY    Special Requests      Blood Culture results may not be optimal due to an excessive volume of blood received in culture bottles   Culture      NO GROWTH 5 DAYS Performed at [facility], [address] [place], [place] [address]    Report Status 09/08/2023 FINAL   CBC with Differential  Result Value Ref Range   WBC 18.9 (H) 4.0 - 10.5 K/uL   RBC 3.80 (L) 3.87 - 5.11 MIL/uL   Hemoglobin 11.9 (L) 12.0 - 15.0 g/dL   HCT 56.3 87.5 - 64.3 %   MCV 102.1 (H) 80.0 - 100.0 fL   MCH 31.3 26.0 - 34.0 pg   MCHC 30.7 30.0 - 36.0 g/dL   RDW 32.9 51.8 - 84.1 %   Platelets 152 150 - 400 K/uL   nRBC 0.0 0.0 - 0.2 %   Neutrophils Relative % 88 %   Neutro Abs 16.5 (H) 1.7 - 7.7 K/uL   Lymphocytes Relative 7 %   Lymphs Abs 1.2 0.7 - 4.0 K/uL   Monocytes Relative 4 %   Monocytes Absolute 0.8 0.1 - 1.0 K/uL   Eosinophils Relative 1 %   Eosinophils Absolute 0.3 0.0 - 0.5 K/uL   Basophils Relative 0 %   Basophils Absolute 0.0 0.0 - 0.1 K/uL   Immature Granulocytes 0 %   Abs Immature Granulocytes 0.08 (H) 0.00 - 0.07 K/uL  Lactic acid, plasma  Result Value Ref Range   Lactic Acid, Venous 2.2 (HH) 0.5 - 1.9 mmol/L  Lactic acid, plasma  Result Value Ref Range   Lactic Acid, Venous 2.6 (HH) 0.5 - 1.9 mmol/L  Comprehensive metabolic panel  Result Value Ref Range   Sodium 137 135 - 145 mmol/L   Potassium 4.4 3.5 - 5.1 mmol/L   Chloride 102 98 - 111 mmol/L   CO2 26 22 - 32 mmol/L   Glucose, Bld 172 (H) 70 - 99 mg/dL   BUN 21 8 - 23 mg/dL   Creatinine, Ser 6.60 (H) 0.44 - 1.00 mg/dL   Calcium 8.6 (L) 8.9 - 10.3 mg/dL   Total Protein 7.3 6.5 - 8.1 g/dL   Albumin 3.2 (L) 3.5 - 5.0 g/dL   AST 20 15 - 41 U/L   ALT 14 0 - 44 U/L    Alkaline Phosphatase 104 38 - 126 U/L   Total Bilirubin 0.7 0.3 - 1.2 mg/dL   GFR, Estimated 36 (L) >60 mL/min   Anion gap 9 5 - 15  Protime-INR  Result Value Ref Range   Prothrombin Time 16.8 (H) 11.4 - 15.2 seconds   INR 1.3 (H) 0.8 - 1.2  APTT  Result Value Ref Range   aPTT 33 24 - 36 seconds  Urinalysis, w/ Reflex to Culture (Infection Suspected) -Urine, Clean Catch  Result Value Ref Range  Specimen Source URINE, CLEAN CATCH    Color, Urine YELLOW YELLOW   APPearance CLEAR CLEAR   Specific Gravity, Urine 1.016 1.005 - 1.030   pH 5.0 5.0 - 8.0   Glucose, UA NEGATIVE NEGATIVE mg/dL   Hgb urine dipstick MODERATE (A) NEGATIVE   Bilirubin Urine NEGATIVE NEGATIVE   Ketones, ur NEGATIVE NEGATIVE mg/dL   Protein, ur NEGATIVE NEGATIVE mg/dL   Nitrite NEGATIVE NEGATIVE   Leukocytes,Ua NEGATIVE NEGATIVE   RBC / HPF 0-5 0 - 5 RBC/hpf   WBC, UA 0-5 0 - 5 WBC/hpf   Bacteria, UA RARE (A) NONE SEEN   Squamous Epithelial / HPF 0-5 0 - 5 /HPF   Mucus PRESENT    Hyaline Casts, UA PRESENT   Creatinine, serum  Result Value Ref Range   Creatinine, Ser 1.41 (H) 0.44 - 1.00 mg/dL   GFR, Estimated 36 (L) >60 mL/min  Vitamin B12  Result Value Ref Range   Vitamin B-12 200 180 - 914 pg/mL  Folate  Result Value Ref Range   Folate 12.5 >5.9 ng/mL  CBC  Result Value Ref Range   WBC 20.4 (H) 4.0 - 10.5 K/uL   RBC 2.96 (L) 3.87 - 5.11 MIL/uL   Hemoglobin 9.5 (L) 12.0 - 15.0 g/dL   HCT 84.1 (L) 66.0 - 63.0 %   MCV 103.4 (H) 80.0 - 100.0 fL   MCH 32.1 26.0 - 34.0 pg   MCHC 31.0 30.0 - 36.0 g/dL   RDW 16.0 10.9 - 32.3 %   Platelets 135 (L) 150 - 400 K/uL   nRBC 0.0 0.0 - 0.2 %  Magnesium  Result Value Ref Range   Magnesium 1.9 1.7 - 2.4 mg/dL  Phosphorus  Result Value Ref Range   Phosphorus 3.3 2.5 - 4.6 mg/dL  Lactic acid, plasma  Result Value Ref Range   Lactic Acid, Venous 1.1 0.5 - 1.9 mmol/L  CBC with Differential/Platelet  Result Value Ref Range   WBC 9.3 4.0 - 10.5 K/uL   RBC  3.08 (L) 3.87 - 5.11 MIL/uL   Hemoglobin 9.7 (L) 12.0 - 15.0 g/dL   HCT 55.7 (L) 32.2 - 02.5 %   MCV 103.9 (H) 80.0 - 100.0 fL   MCH 31.5 26.0 - 34.0 pg   MCHC 30.3 30.0 - 36.0 g/dL   RDW 42.7 06.2 - 37.6 %   Platelets 126 (L) 150 - 400 K/uL   nRBC 0.0 0.0 - 0.2 %   Neutrophils Relative % 67 %   Neutro Abs 6.1 1.7 - 7.7 K/uL   Lymphocytes Relative 19 %   Lymphs Abs 1.8 0.7 - 4.0 K/uL   Monocytes Relative 9 %   Monocytes Absolute 0.9 0.1 - 1.0 K/uL   Eosinophils Relative 5 %   Eosinophils Absolute 0.4 0.0 - 0.5 K/uL   Basophils Relative 0 %   Basophils Absolute 0.0 0.0 - 0.1 K/uL   Immature Granulocytes 0 %   Abs Immature Granulocytes 0.03 0.00 - 0.07 K/uL  Basic metabolic panel  Result Value Ref Range   Sodium 139 135 - 145 mmol/L   Potassium 4.5 3.5 - 5.1 mmol/L   Chloride 103 98 - 111 mmol/L   CO2 30 22 - 32 mmol/L   Glucose, Bld 91 70 - 99 mg/dL   BUN 17 8 - 23 mg/dL   Creatinine, Ser 2.83 (H) 0.44 - 1.00 mg/dL   Calcium 8.3 (L) 8.9 - 10.3 mg/dL   GFR, Estimated 41 (L) >60 mL/min  Anion gap 6 5 - 15  Iron and TIBC  Result Value Ref Range   Iron 26 (L) 28 - 170 ug/dL   TIBC 132 (L) 440 - 102 ug/dL   Saturation Ratios 11 10.4 - 31.8 %   UIBC 212 ug/dL  Ferritin  Result Value Ref Range   Ferritin 59 11 - 307 ng/mL       09/15/2023    3:05 PM 06/04/2023    3:13 PM 04/17/2023    2:06 PM 03/13/2023   11:18 AM 02/12/2023    3:19 PM  Depression screen PHQ 2/9  Decreased Interest 0 0 0 0 0  Down, Depressed, Hopeless 0 0 0 0 0  PHQ - 2 Score 0 0 0 0 0  Altered sleeping 1 1  0 1  Tired, decreased energy 1 1  0 1  Change in appetite 0 0  0 0  Feeling bad or failure about yourself  0 0  0 0  Trouble concentrating 0 0  0 0  Moving slowly or fidgety/restless 0 0  0 0  Suicidal thoughts 0 0  0 0  PHQ-9 Score 2 2  0 2  Difficult doing work/chores Extremely dIfficult Somewhat difficult  Not difficult at all Somewhat difficult       09/15/2023    3:05 PM 06/04/2023     3:14 PM 01/12/2023    2:06 PM 01/06/2023    1:13 PM  GAD 7 : Generalized Anxiety Score  Nervous, Anxious, on Edge 0 0 0 0  Control/stop worrying 0 0 0 0  Worry too much - different things 0 1 0 1  Trouble relaxing 0 0 0 0  Restless 0 0 0 0  Easily annoyed or irritable 0 0 0 0  Afraid - awful might happen 0 0 0 0  Total GAD 7 Score 0 1 0 1  Anxiety Difficulty Not difficult at all Somewhat difficult  Somewhat difficult   Pertinent labs & imaging results that were available during my care of the patient were reviewed by me and considered in my medical decision making.  Assessment & Plan:  Lavetta was seen today for hospitalization follow-up.  Diagnoses and all orders for this visit:  Severe sepsis [facility]) Patient improving. Labs as below. Will communicate results to patient once available. Will await results to determine next steps. Will refer to home health as below for patient to receive PT/OT eval after hospitalization for decrease mobility.  -     CBC with Differential/Platelet -     CMP14+EGFR -     Home Health  Lymphedema of both lower extremities Labs as below. Will communicate results to patient once available. Will await results to determine next steps.  Referral placed as below for further evaluation.  Reiterated to patient compression stockings, elevation, and ambulation as tolerated.  -     CBC with Differential/Platelet -     CMP14+EGFR -     Home Health -     Ambulatory referral to Orthopedic Surgery  Chronic obstructive pulmonary disease, unspecified COPD type (HCC) Established with Pulmonology. Follow up in November.   Essential hypertension Well controlled. Continue current regimen.   Macrocytic anemia Vitamin B12 within goal during hospitalization. Provided B12 supplementation in hospital per discharge note on 09/10/23. Will monitor labs and repeat B12 in 3 months.   Stage 3b chronic kidney disease (HCC)  Oral lesion Will start medication as below for  potential thrush.  -     clotrimazole (  MYCELEX) 10 MG troche; Take 1 tablet (10 mg total) by mouth 5 (five) times daily.  Continue all other maintenance medications.  Follow up plan: Return in about 4 weeks (around 10/13/2023) for Lymphedema .   Continue healthy lifestyle choices, including diet (rich in fruits, vegetables, and lean proteins, and low in salt and simple carbohydrates) and exercise (at least 30 minutes of moderate physical activity daily).  Written and verbal instructions provided   The above assessment and management plan was discussed with the patient. The patient verbalized understanding of and has agreed to the management plan. Patient is aware to call the clinic if they develop any new symptoms or if symptoms persist or worsen. Patient is aware when to return to the clinic for a follow-up visit. Patient educated on when it is appropriate to go to the emergency department.   Neale Burly, DNP-FNP [facility] [address] [place], [place] [address] [phone]

## 2023-09-16 LAB — CBC WITH DIFFERENTIAL/PLATELET
Basophils Absolute: 0.1 10*3/uL (ref 0.0–0.2)
Basos: 1 %
EOS (ABSOLUTE): 0.5 10*3/uL — ABNORMAL HIGH (ref 0.0–0.4)
Eos: 7 %
Hematocrit: 34.7 % (ref 34.0–46.6)
Hemoglobin: 11.2 g/dL (ref 11.1–15.9)
Immature Grans (Abs): 0 10*3/uL (ref 0.0–0.1)
Immature Granulocytes: 0 %
Lymphocytes Absolute: 2 10*3/uL (ref 0.7–3.1)
Lymphs: 30 %
MCH: 31.7 pg (ref 26.6–33.0)
MCHC: 32.3 g/dL (ref 31.5–35.7)
MCV: 98 fL — ABNORMAL HIGH (ref 79–97)
Monocytes Absolute: 0.5 10*3/uL (ref 0.1–0.9)
Monocytes: 7 %
Neutrophils Absolute: 3.8 10*3/uL (ref 1.4–7.0)
Neutrophils: 55 %
Platelets: 163 10*3/uL (ref 150–450)
RBC: 3.53 x10E6/uL — ABNORMAL LOW (ref 3.77–5.28)
RDW: 11.6 % — ABNORMAL LOW (ref 11.7–15.4)
WBC: 6.8 10*3/uL (ref 3.4–10.8)

## 2023-09-16 LAB — CMP14+EGFR
ALT: 8 [IU]/L (ref 0–32)
AST: 18 [IU]/L (ref 0–40)
Albumin: 3.5 g/dL — ABNORMAL LOW (ref 3.7–4.7)
Alkaline Phosphatase: 112 [IU]/L (ref 44–121)
BUN/Creatinine Ratio: 18 (ref 12–28)
BUN: 29 mg/dL — ABNORMAL HIGH (ref 8–27)
Bilirubin Total: 0.3 mg/dL (ref 0.0–1.2)
CO2: 29 mmol/L (ref 20–29)
Calcium: 9 mg/dL (ref 8.7–10.3)
Chloride: 100 mmol/L (ref 96–106)
Creatinine, Ser: 1.58 mg/dL — ABNORMAL HIGH (ref 0.57–1.00)
Globulin, Total: 3.5 g/dL (ref 1.5–4.5)
Glucose: 118 mg/dL — ABNORMAL HIGH (ref 70–99)
Potassium: 4.5 mmol/L (ref 3.5–5.2)
Sodium: 143 mmol/L (ref 134–144)
Total Protein: 7 g/dL (ref 6.0–8.5)
eGFR: 31 mL/min/{1.73_m2} — ABNORMAL LOW (ref >59)

## 2023-09-16 NOTE — Progress Notes (Signed)
Anemia has resolved. Some slight changes on CBC, not concerning at this time. GFR stable. Recommend patient follow up with [facility]. Slightly low albumin, recommend patient increase protein intake. Avoid NSAIDs.

## 2023-09-20 ENCOUNTER — Other Ambulatory Visit: Payer: Self-pay | Admitting: Cardiology

## 2023-09-24 ENCOUNTER — Encounter: Payer: Self-pay | Admitting: Internal Medicine

## 2023-09-24 ENCOUNTER — Ambulatory Visit: Payer: Medicare Other | Admitting: Internal Medicine

## 2023-09-24 VITALS — BP 133/50 | HR 61 | Ht 67.0 in | Wt 189.0 lb

## 2023-09-24 DIAGNOSIS — I10 Essential (primary) hypertension: Secondary | ICD-10-CM

## 2023-09-24 DIAGNOSIS — J4530 Mild persistent asthma, uncomplicated: Secondary | ICD-10-CM | POA: Diagnosis not present

## 2023-09-24 NOTE — Progress Notes (Signed)
Courtney Berry, female    DOB: [birth year]    MRN: [id number]   Brief patient profile:  40  yowf  never smoker late 5s sent to [facility] for pft's for cough /sob from [employer] with nl lung test  referred to pulmonary clinic in [place]  08/06/2023 by Neale Burly with persistent cough since dec 2023 and no better on Breztri though technique very poor.   History of Present Illness  08/06/2023  Pulmonary/ 1st office eval/ Elmina Hendel / [place] [facility]  Chief Complaint  Patient presents with   Consult    Pulmonary disease   Dyspnea:  very little activity activity due lymphedema but not really limied by doe Cough: day time mild and really bad with URI's sev times a year  Sleep: bed is flat/ one pillow s resp  SABA use: none now  02: none  Rec Stop lisinopril  Start olmesartan 20 mg one daily in its place  Please schedule a follow up office visit in 6 weeks, call sooner if needed with all medications /inhalers/ solutions in hand    09/24/2023  f/u ov/[place] office/[facility] Cuttino re: ? ACei case   maint on olmesartan  / did not bring meds as requested  Chief Complaint  Patient presents with   Cough   Shortness of Breath   Dyspnea:  25-50 ft limited by feet and legs give out  Cough: gone  Sleeping: flat bed one pillow s  resp cc  SABA use: none  02: none    No obvious day to day or daytime variability or assoc excess/ purulent sputum or mucus plugs or hemoptysis or cp or chest tightness, subjective wheeze or overt sinus or hb symptoms.    Also denies any obvious fluctuation of symptoms with weather or environmental changes or other aggravating or alleviating factors except as outlined above   No unusual exposure hx or h/o childhood pna/ asthma or knowledge of premature birth.  Current Allergies, Complete Past Medical History, Past Surgical History, Family History, and Social History were reviewed in [employer] record.  ROS  The following are not active  complaints unless bolded Hoarseness, sore throat, dysphagia, dental problems, itching, sneezing,  nasal congestion or discharge of excess mucus or purulent secretions, ear ache,   fever, chills, sweats, unintended wt loss or wt gain, classically pleuritic or exertional cp,  orthopnea pnd or arm/hand swelling  or leg swelling, presyncope, palpitations, abdominal pain, anorexia, nausea, vomiting, diarrhea  or change in bowel habits or change in bladder habits, change in stools or change in urine, dysuria, hematuria,  rash, arthralgias, visual complaints, headache, numbness, weakness or ataxia or problems with walking or coordination,  change in mood or  memory.        Current Meds  Medication Sig   acetaminophen (TYLENOL) 500 MG tablet Take 1,000 mg by mouth every 6 (six) hours as needed for moderate pain.   amiodarone (PACERONE) 200 MG tablet TAKE 1/2 TABLET BY MOUTH DAILY   amLODipine (NORVASC) 2.5 MG tablet Take 1 tablet (2.5 mg total) by mouth daily.   apixaban (ELIQUIS) 5 MG TABS tablet Take 1 tablet (5 mg total) by mouth 2 (two) times daily.   cyanocobalamin 1000 MCG tablet Take 1 tablet (1,000 mcg total) by mouth daily.   ferrous sulfate 325 (65 FE) MG tablet Take 1 tablet (325 mg total) by mouth daily with breakfast.   metoprolol tartrate (LOPRESSOR) 25 MG tablet TAKE 1/2 TABLET TWICE A DAY BY MOUTH  olmesartan (BENICAR) 20 MG tablet Take 1 tablet (20 mg total) by mouth daily.   Probiotic Product (PROBIOTIC DAILY PO) Take 1 tablet by mouth daily.   torsemide (DEMADEX) 20 MG tablet Take 1 tablet (20 mg total) by mouth daily. For Fluid (Patient taking differently: Take 20 mg by mouth daily. For Fluid REPORTS TAKES PRN)   [DISCONTINUED] clotrimazole (MYCELEX) 10 MG troche Take 1 tablet (10 mg total) by mouth 5 (five) times daily.           Past Medical History:  Diagnosis Date   CKD (chronic kidney disease) stage 3, GFR 30-59 ml/min (HCC)    COPD (chronic obstructive pulmonary disease)  (HCC)    Diverticulosis    Essential hypertension    History of chest pain    Low risk Cardiolite 2010   Lymphedema    Mixed hyperlipidemia    PAF (paroxysmal atrial fibrillation) (HCC)    Rheumatoid arthritis (HCC)       Objective:    Wts   09/24/2023       189   09/15/23 189 lb (85.7 kg)  09/14/23 189 lb (85.7 kg)  09/04/23 210 lb 8.6 oz (95.5 kg)      Vital signs reviewed  09/24/2023  - Note at rest 02 sats  94% on RA   General appearance:    w/c bound elerly wf   no teeth    HEENT : Oropharynx  clear          NECK :  without  apparent JVD/ palpable Nodes/TM    LUNGS: no acc muscle use,  Nl contour chest which is clear to A and P bilaterally without cough on insp or exp maneuvers   CV:  RRR  no s3 or murmur or increase in P2, and  mod  lymphedema both LEs    ABD:  obese soft and nontender   MS:   ext warm without deformities Or obvious joint restrictions  calf tenderness, cyanosis or clubbing    SKIN: warm and dry without lesions    NEURO:  alert, approp, nl sensorium with  no motor or cerebellar deficits apparent.          Assessment

## 2023-09-24 NOTE — Assessment & Plan Note (Signed)
Never smoker with heavy textile exposure  - PFTs  09/2016 with FEV1/VC  = 55% and FEV1 54%  with classic mild concave curvature and no response to saba  - 08/06/2023  After extensive coaching inhaler device,  effectiveness =    60% from baseline around 30%(short Ti)   She may have a component of ACOS or chronic asthma s reversible component but all her symptoms have resolved off acei so can just use saba prn and f/u here if worsen off acei   Discussed in detail all the  indications, usual  risks and alternatives  relative to the benefits with patient who agrees to proceed with conservative Rx as outlined.           Each maintenance medication was reviewed in detail including emphasizing most importantly the difference between maintenance and prns and under what circumstances the prns are to be triggered using an action plan format where appropriate.  Total time for H and P, chart review, counseling, reviewing hfa device(s) and generating customized AVS unique to this office visit / same day charting = 31 final summary f/u ov

## 2023-09-24 NOTE — Assessment & Plan Note (Signed)
D/c lisinopril and started on olmesartan 20 mg  due to cough / pseudo asthma - all airway symptoms resolved off acei and off all inhalers   Although even in retrospect it may not be clear the ACEi contributed to the pt's symptoms,  Pt improved off them and adding them back at this point or in the future would risk confusion in interpretation of non-specific respiratory symptoms to which this patient is prone  ie  Better not to muddy the waters here.   >>> pulmonary f/u can be prn

## 2023-09-24 NOTE — Patient Instructions (Signed)
No pulmonary follow up is needed at this point  I strongly recommend you take all medications to all providers

## 2023-09-28 ENCOUNTER — Ambulatory Visit: Payer: Medicare Other | Admitting: Orthopedic Surgery

## 2023-09-28 ENCOUNTER — Encounter: Payer: Self-pay | Admitting: Orthopedic Surgery

## 2023-09-28 DIAGNOSIS — I890 Lymphedema, not elsewhere classified: Secondary | ICD-10-CM

## 2023-09-28 DIAGNOSIS — I872 Venous insufficiency (chronic) (peripheral): Secondary | ICD-10-CM

## 2023-09-28 NOTE — Progress Notes (Signed)
Office Visit Note   Patient: Courtney Berry           Date of Birth: Sep 13, 1933           MRN: [id number] Visit Date: 09/28/2023              Requested by: Arrie Senate, FNP [address] [place],  [place] [address] PCP: Arrie Senate, FNP  Chief Complaint  Patient presents with   Left Leg - Edema   Right Leg - Edema      HPI: Patient is an 89 year old woman who presents with venous and lymphatic insufficiency both lower extremities.  Patient is currently wearing knee-high compression socks.  She states she has used knee-high compression pumps in the past that did not work.  Patient is currently on Demadex to help with fluid retention.  Assessment & Plan: Visit Diagnoses:  1. Lymphedema   2. Venous stasis dermatitis     Plan: Patient will continue with her compression socks.  We will repeat measurements in 4 weeks.  Will try to get her set up with the lymphedema pumps.  Follow-Up Instructions: Return in about 4 weeks (around 10/26/2023).   Ortho Exam  Patient is alert, oriented, no adenopathy, well-dressed, normal affect, normal respiratory effort. Examination of both lower extremities patient has venous and lymphatic insufficiency.  She has bilateral swelling with stage III changes with papillomata's changes.  Patient has tried exercise elevation and is currently using 15 to 20 mm of compression.  Her previous lymphedema pumps only went up to her knee.  Patient has had over 4 weeks of conservative therapy.  Measurement at this time right ankle circumference 27 cm, left ankle circumference 30 cm. Right calf circumference 39 cm, left calf circumference 39 cm. Right knee circumference 47 cm, left knee circumference 47 cm. Right thigh circumference 50 cm, left thigh circumference 49 cm. Suprapubic hip circumference 120 cm on the right, and 120 cm on the left in circumference.  Imaging: No results found. No images are attached to the  encounter.  Labs: Lab Results  Component Value Date   HGBA1C 5.4 01/06/2023   ESRSEDRATE 36 02/04/2018   LABURIC 7.1 02/04/2018   REPTSTATUS 09/08/2023 FINAL 09/03/2023   REPTSTATUS 09/08/2023 FINAL 09/03/2023   CULT  09/03/2023    NO GROWTH 5 DAYS Performed at [facility], [address], [place], [place] [address]    CULT  09/03/2023    NO GROWTH 5 DAYS Performed at [facility], [address] [place], [place] [address]    Imelda Pillow ENTEROCOCCUS FAECALIS (A) 10/26/2019     Lab Results  Component Value Date   ALBUMIN 3.5 (L) 09/15/2023   ALBUMIN 3.2 (L) 09/03/2023   ALBUMIN 3.4 (L) 06/04/2023    Lab Results  Component Value Date   MG 1.9 09/04/2023   MG 1.7 10/26/2019   MG 1.7 08/02/2018   No results found for: "VD25OH"  No results found for: "PREALBUMIN"    Latest Ref Rng & Units 09/15/2023    3:34 PM 09/05/2023    4:47 AM 09/04/2023    3:48 AM  CBC EXTENDED  WBC 3.4 - 10.8 x10E3/uL 6.8  9.3  20.4   RBC 3.77 - 5.28 x10E6/uL 3.53  3.08  2.96   Hemoglobin 11.1 - 15.9 g/dL 21.3  9.7  9.5   HCT 08.6 - 46.6 % 34.7  32.0  30.6   Platelets 150 - 450 x10E3/uL 163  126  135   NEUT#  1.4 - 7.0 x10E3/uL 3.8  6.1    Lymph# 0.7 - 3.1 x10E3/uL 2.0  1.8       There is no height or weight on file to calculate BMI.  Orders:  No orders of the defined types were placed in this encounter.  No orders of the defined types were placed in this encounter.    Procedures: No procedures performed  Clinical Data: No additional findings.  ROS:  All other systems negative, except as noted in the HPI. Review of Systems  Objective: Vital Signs: There were no vitals taken for this visit.  Specialty Comments:  No specialty comments available.  PMFS History: Patient Active Problem List   Diagnosis Date Noted   Severe sepsis (HCC) 09/03/2023   Cellulitis of lower extremity 09/03/2023   Atrial fibrillation, chronic (HCC) 09/03/2023   Macrocytic anemia 09/03/2023    Hyperglycemia 09/03/2023   Lactic acidosis 09/03/2023   Asthmatic bronchitis 08/06/2023   Clostridium difficile diarrhea 01/24/2023   Coronary atherosclerosis 01/23/2023   Weakness 01/23/2023   CKD (chronic kidney disease) stage 3, GFR 30-59 ml/min (HCC) 05/29/2022   Secondary hypercoagulable state (HCC) 01/11/2021   Nausea & vomiting 11/22/2019   Neuropathy 03/02/2019   PAF (paroxysmal atrial fibrillation) (HCC) 08/01/2018   Chronic obstructive pulmonary disease (HCC) 08/01/2018   Lymphedema of both lower extremities 02/09/2017   Rheumatoid arthritis (HCC) 10/20/2016   Peripheral vascular disease (HCC) 10/20/2016   Chronic anticoagulation 02/14/2016   Dyslipidemia 09/03/2009   Essential hypertension 09/03/2009   Past Medical History:  Diagnosis Date   CKD (chronic kidney disease) stage 3, GFR 30-59 ml/min (HCC)    COPD (chronic obstructive pulmonary disease) (HCC)    Diverticulosis    Essential hypertension    History of chest pain    Low risk Cardiolite 2010   Lymphedema    Mixed hyperlipidemia    PAF (paroxysmal atrial fibrillation) (HCC)    Rheumatoid arthritis (HCC)     Family History  Problem Relation Age of Onset   Colon cancer Father    Cancer Mother    Heart disease Sister        CABG age 53   Cancer Brother     Past Surgical History:  Procedure Laterality Date   TOTAL ABDOMINAL HYSTERECTOMY  11/18/1967   Social History   Occupational History   Occupation: Retired  Tobacco Use   Smoking status: Never    Passive exposure: Never   Smokeless tobacco: Never   Tobacco comments:    Tobacco use-no  Vaping Use   Vaping status: Never Used  Substance and Sexual Activity   Alcohol use: No    Alcohol/week: 0.0 standard drinks of alcohol   Drug use: No   Sexual activity: Not on file

## 2023-11-08 ENCOUNTER — Other Ambulatory Visit: Payer: Self-pay | Admitting: Cardiology

## 2023-11-08 DIAGNOSIS — I480 Paroxysmal atrial fibrillation: Secondary | ICD-10-CM

## 2023-11-09 DIAGNOSIS — N1832 Chronic kidney disease, stage 3b: Secondary | ICD-10-CM | POA: Diagnosis not present

## 2023-11-09 DIAGNOSIS — I129 Hypertensive chronic kidney disease with stage 1 through stage 4 chronic kidney disease, or unspecified chronic kidney disease: Secondary | ICD-10-CM | POA: Diagnosis not present

## 2023-11-09 DIAGNOSIS — N2581 Secondary hyperparathyroidism of renal origin: Secondary | ICD-10-CM | POA: Diagnosis not present

## 2023-11-09 DIAGNOSIS — N189 Chronic kidney disease, unspecified: Secondary | ICD-10-CM | POA: Diagnosis not present

## 2023-11-09 DIAGNOSIS — D638 Anemia in other chronic diseases classified elsewhere: Secondary | ICD-10-CM | POA: Diagnosis not present

## 2023-11-09 NOTE — Telephone Encounter (Signed)
Prescription refill request for Eliquis received. Indication:afib Last office visit:10/24 Scr:1.58  10/24 Age: 89 Weight:85.7  kg  Prescription refilled

## 2023-11-16 ENCOUNTER — Ambulatory Visit: Payer: Medicare Other | Admitting: Orthopedic Surgery

## 2023-11-30 ENCOUNTER — Ambulatory Visit: Payer: Medicare Other | Admitting: Orthopedic Surgery

## 2023-12-03 ENCOUNTER — Ambulatory Visit: Payer: Medicare Other | Admitting: Family Medicine

## 2023-12-10 DIAGNOSIS — N1832 Chronic kidney disease, stage 3b: Secondary | ICD-10-CM | POA: Diagnosis not present

## 2023-12-10 DIAGNOSIS — I480 Paroxysmal atrial fibrillation: Secondary | ICD-10-CM | POA: Diagnosis not present

## 2023-12-10 DIAGNOSIS — D638 Anemia in other chronic diseases classified elsewhere: Secondary | ICD-10-CM | POA: Diagnosis not present

## 2023-12-10 DIAGNOSIS — I4811 Longstanding persistent atrial fibrillation: Secondary | ICD-10-CM | POA: Diagnosis not present

## 2023-12-14 ENCOUNTER — Ambulatory Visit: Payer: Medicare Other | Admitting: Orthopedic Surgery

## 2023-12-14 DIAGNOSIS — I872 Venous insufficiency (chronic) (peripheral): Secondary | ICD-10-CM | POA: Diagnosis not present

## 2023-12-14 DIAGNOSIS — I890 Lymphedema, not elsewhere classified: Secondary | ICD-10-CM

## 2023-12-15 ENCOUNTER — Ambulatory Visit: Payer: Medicare Other | Admitting: Family Medicine

## 2023-12-22 ENCOUNTER — Encounter: Payer: Self-pay | Admitting: Orthopedic Surgery

## 2023-12-22 NOTE — Progress Notes (Signed)
Office Visit Note   Patient: Courtney Berry           Date of Birth: 13-Jun-1933           MRN: [id number] Visit Date: 12/14/2023              Requested by: Arrie Senate, FNP [address] [place],  [place] [address] PCP: Arrie Senate, FNP  Chief Complaint  Patient presents with   Right Leg - Follow-up   Left Leg - Follow-up      HPI: Patient is an 89 year old woman with venous and lymphatic insufficiency.  Patient states she has previously used pumps that stopped mid thigh.  She used these 30 minutes a day.  Patient states these burned her legs and could not use them anymore.  Assessment & Plan: Visit Diagnoses:  1. Lymphedema   2. Venous stasis dermatitis     Plan: Measurements were obtained patient will need new lymphedema pumps.  Follow-Up Instructions: Return in about 4 weeks (around 01/11/2024).   Ortho Exam  Patient is alert, oriented, no adenopathy, well-dressed, normal affect, normal respiratory effort. Examination patient has dermatitis with papillomata's changes to both legs there is no ulcers or cellulitis.  She does have a palpable pulse bilaterally.  Circumferential measurements of the lower extremities show the ankle 30 cm in circumference on the right 28 cm in circumference on the left.  Calf circumference 40 cm on the right and 40 cm on the left.  Knee circumference 51 cm on the right and 46 cm on the left.  Thigh circumference 50 cm on the right and 50 cm on the left.  Hip circumference 130 cm.  Imaging: No results found. No images are attached to the encounter.  Labs: Lab Results  Component Value Date   HGBA1C 5.4 01/06/2023   ESRSEDRATE 36 02/04/2018   LABURIC 7.1 02/04/2018   REPTSTATUS 09/08/2023 FINAL 09/03/2023   REPTSTATUS 09/08/2023 FINAL 09/03/2023   CULT  09/03/2023    NO GROWTH 5 DAYS Performed at [facility], [address], [place], [place] [address]    CULT  09/03/2023    NO GROWTH 5 DAYS Performed at  [facility], [address], [place], [place] [address]    Imelda Pillow ENTEROCOCCUS FAECALIS (A) 10/26/2019     Lab Results  Component Value Date   ALBUMIN 3.5 (L) 09/15/2023   ALBUMIN 3.2 (L) 09/03/2023   ALBUMIN 3.4 (L) 06/04/2023    Lab Results  Component Value Date   MG 1.9 09/04/2023   MG 1.7 10/26/2019   MG 1.7 08/02/2018   No results found for: "VD25OH"  No results found for: "PREALBUMIN"    Latest Ref Rng & Units 09/15/2023    3:34 PM 09/05/2023    4:47 AM 09/04/2023    3:48 AM  CBC EXTENDED  WBC 3.4 - 10.8 x10E3/uL 6.8  9.3  20.4   RBC 3.77 - 5.28 x10E6/uL 3.53  3.08  2.96   Hemoglobin 11.1 - 15.9 g/dL 21.3  9.7  9.5   HCT 08.6 - 46.6 % 34.7  32.0  30.6   Platelets 150 - 450 x10E3/uL 163  126  135   NEUT# 1.4 - 7.0 x10E3/uL 3.8  6.1    Lymph# 0.7 - 3.1 x10E3/uL 2.0  1.8       There is no height or weight on file to calculate BMI.  Orders:  No orders of the defined types were placed in this encounter.  No  orders of the defined types were placed in this encounter.    Procedures: No procedures performed  Clinical Data: No additional findings.  ROS:  All other systems negative, except as noted in the HPI. Review of Systems  Objective: Vital Signs: There were no vitals taken for this visit.  Specialty Comments:  No specialty comments available.  PMFS History: Patient Active Problem List   Diagnosis Date Noted   Severe sepsis (HCC) 09/03/2023   Cellulitis of lower extremity 09/03/2023   Atrial fibrillation, chronic (HCC) 09/03/2023   Macrocytic anemia 09/03/2023   Hyperglycemia 09/03/2023   Lactic acidosis 09/03/2023   Asthmatic bronchitis 08/06/2023   Clostridium difficile diarrhea 01/24/2023   Coronary atherosclerosis 01/23/2023   Weakness 01/23/2023   CKD (chronic kidney disease) stage 3, GFR 30-59 ml/min (HCC) 05/29/2022   Secondary hypercoagulable state (HCC) 01/11/2021   Nausea & vomiting 11/22/2019   Neuropathy 03/02/2019   PAF  (paroxysmal atrial fibrillation) (HCC) 08/01/2018   Chronic obstructive pulmonary disease (HCC) 08/01/2018   Lymphedema of both lower extremities 02/09/2017   Rheumatoid arthritis (HCC) 10/20/2016   Peripheral vascular disease (HCC) 10/20/2016   Chronic anticoagulation 02/14/2016   Dyslipidemia 09/03/2009   Essential hypertension 09/03/2009   Past Medical History:  Diagnosis Date   CKD (chronic kidney disease) stage 3, GFR 30-59 ml/min (HCC)    COPD (chronic obstructive pulmonary disease) (HCC)    Diverticulosis    Essential hypertension    History of chest pain    Low risk Cardiolite 2010   Lymphedema    Mixed hyperlipidemia    PAF (paroxysmal atrial fibrillation) (HCC)    Rheumatoid arthritis (HCC)     Family History  Problem Relation Age of Onset   Colon cancer Father    Cancer Mother    Heart disease Sister        CABG age 28   Cancer Brother     Past Surgical History:  Procedure Laterality Date   TOTAL ABDOMINAL HYSTERECTOMY  11/18/1967   Social History   Occupational History   Occupation: Retired  Tobacco Use   Smoking status: Never    Passive exposure: Never   Smokeless tobacco: Never   Tobacco comments:    Tobacco use-no  Vaping Use   Vaping status: Never Used  Substance and Sexual Activity   Alcohol use: No    Alcohol/week: 0.0 standard drinks of alcohol   Drug use: No   Sexual activity: Not on file

## 2023-12-30 ENCOUNTER — Other Ambulatory Visit: Payer: Self-pay | Admitting: Cardiology

## 2024-01-05 ENCOUNTER — Telehealth: Payer: Self-pay | Admitting: Family Medicine

## 2024-01-05 ENCOUNTER — Other Ambulatory Visit: Payer: Self-pay | Admitting: Family Medicine

## 2024-01-05 MED ORDER — FERROUS SULFATE 325 (65 FE) MG PO TABS: 325.0000 mg | ORAL_TABLET | Freq: Every day | ORAL | 3 refills | Status: DC

## 2024-01-05 NOTE — Telephone Encounter (Signed)
 Copied from CRM [id number]. Topic: Clinical - Medication Refill >> Jan 05, 2024  9:56 AM Shelah Lewandowsky wrote: Most Recent Primary Care Visit:  Provider: Neale Burly CAPPS  Department: [facility] MED  Visit Type: HOSPITAL FU  Date: 09/15/2023  Medication: ferrous sulfate 325 (65 FE) MG tablet  Has the patient contacted their pharmacy? No (Agent: If no, request that the patient contact the pharmacy for the refill. If patient does not wish to contact the pharmacy document the reason why and proceed with request.) (Agent: If yes, when and what did the pharmacy advise?)  Is this the correct pharmacy for this prescription? Yes If no, delete pharmacy and type the correct one.  This is the patient's preferred pharmacy:  [id number] - [place], [place] - [address] [place] [address] Phone: [phone] Fax: [phone]   Has the prescription been filled recently? Yes  Is the patient out of the medication? Yes  Has the patient been seen for an appointment in the last year OR does the patient have an upcoming appointment? Yes  Can we respond through MyChart? No  Agent: Please be advised that Rx refills may take up to 3 business days. We ask that you follow-up with your pharmacy.

## 2024-01-05 NOTE — Telephone Encounter (Signed)
 Copied from CRM [id number]. Topic: Clinical - Medication Question >> Jan 05, 2024  9:59 AM Shelah Lewandowsky wrote: Reason for CRM: ferrous sulfate 325 (65 FE) MG tablet was prescribed by pulmonologist but he has released her back to [place] so this needs to be prescribed by her now.  Refill was submitted

## 2024-01-11 ENCOUNTER — Ambulatory Visit: Payer: Medicare Other | Admitting: Orthopedic Surgery

## 2024-01-11 DIAGNOSIS — I872 Venous insufficiency (chronic) (peripheral): Secondary | ICD-10-CM

## 2024-01-11 DIAGNOSIS — I890 Lymphedema, not elsewhere classified: Secondary | ICD-10-CM

## 2024-01-12 ENCOUNTER — Encounter: Payer: Self-pay | Admitting: Orthopedic Surgery

## 2024-01-12 NOTE — Progress Notes (Signed)
 Office Visit Note   Patient: Courtney Berry           Date of Birth: 12-29-1932           MRN: [id number] Visit Date: 01/11/2024              Requested by: Arrie Senate, FNP [address] [place],  [place] [address] PCP: Arrie Senate, FNP  Chief Complaint  Patient presents with   Right Leg - Wound Check   Left Leg - Wound Check      HPI: Patient is an 89 year old woman with bilateral venous and lymphatic insufficiency.  Patient will require lymphedema pumps.  Patient states she is having difficulty sleeping due to the swelling.  She is currently using compression socks.  Assessment & Plan: Visit Diagnoses:  1. Venous stasis dermatitis   2. Lymphedema     Plan: Will pursue application for the lymphedema pumps.  Follow-Up Instructions: Return in about 4 weeks (around 02/08/2024).   Ortho Exam  Patient is alert, oriented, no adenopathy, well-dressed, normal affect, normal respiratory effort. Examination patient has no drainage or cellulitis in the leg there is brawny skin color changes with papillomata's changes.  There is pitting edema bilaterally.  Imaging: No results found. No images are attached to the encounter.  Labs: Lab Results  Component Value Date   HGBA1C 5.4 01/06/2023   ESRSEDRATE 36 02/04/2018   LABURIC 7.1 02/04/2018   REPTSTATUS 09/08/2023 FINAL 09/03/2023   REPTSTATUS 09/08/2023 FINAL 09/03/2023   CULT  09/03/2023    NO GROWTH 5 DAYS Performed at [facility], [address], [place], [place] [address]    CULT  09/03/2023    NO GROWTH 5 DAYS Performed at [facility], [address], [place], [place] [address]    Imelda Pillow ENTEROCOCCUS FAECALIS (A) 10/26/2019     Lab Results  Component Value Date   ALBUMIN 3.5 (L) 09/15/2023   ALBUMIN 3.2 (L) 09/03/2023   ALBUMIN 3.4 (L) 06/04/2023    Lab Results  Component Value Date   MG 1.9 09/04/2023   MG 1.7 10/26/2019   MG 1.7 08/02/2018   No results found for:  "VD25OH"  No results found for: "PREALBUMIN"    Latest Ref Rng & Units 09/15/2023    3:34 PM 09/05/2023    4:47 AM 09/04/2023    3:48 AM  CBC EXTENDED  WBC 3.4 - 10.8 x10E3/uL 6.8  9.3  20.4   RBC 3.77 - 5.28 x10E6/uL 3.53  3.08  2.96   Hemoglobin 11.1 - 15.9 g/dL 10.2  9.7  9.5   HCT 72.5 - 46.6 % 34.7  32.0  30.6   Platelets 150 - 450 x10E3/uL 163  126  135   NEUT# 1.4 - 7.0 x10E3/uL 3.8  6.1    Lymph# 0.7 - 3.1 x10E3/uL 2.0  1.8       There is no height or weight on file to calculate BMI.  Orders:  No orders of the defined types were placed in this encounter.  No orders of the defined types were placed in this encounter.    Procedures: No procedures performed  Clinical Data: No additional findings.  ROS:  All other systems negative, except as noted in the HPI. Review of Systems  Objective: Vital Signs: There were no vitals taken for this visit.  Specialty Comments:  No specialty comments available.  PMFS History: Patient Active Problem List   Diagnosis Date Noted   Severe sepsis (HCC) 09/03/2023  Cellulitis of lower extremity 09/03/2023   Atrial fibrillation, chronic (HCC) 09/03/2023   Macrocytic anemia 09/03/2023   Hyperglycemia 09/03/2023   Lactic acidosis 09/03/2023   Asthmatic bronchitis 08/06/2023   Clostridium difficile diarrhea 01/24/2023   Coronary atherosclerosis 01/23/2023   Weakness 01/23/2023   CKD (chronic kidney disease) stage 3, GFR 30-59 ml/min (HCC) 05/29/2022   Secondary hypercoagulable state (HCC) 01/11/2021   Nausea & vomiting 11/22/2019   Neuropathy 03/02/2019   PAF (paroxysmal atrial fibrillation) (HCC) 08/01/2018   Chronic obstructive pulmonary disease (HCC) 08/01/2018   Lymphedema of both lower extremities 02/09/2017   Rheumatoid arthritis (HCC) 10/20/2016   Peripheral vascular disease (HCC) 10/20/2016   Chronic anticoagulation 02/14/2016   Dyslipidemia 09/03/2009   Essential hypertension 09/03/2009   Past Medical  History:  Diagnosis Date   CKD (chronic kidney disease) stage 3, GFR 30-59 ml/min (HCC)    COPD (chronic obstructive pulmonary disease) (HCC)    Diverticulosis    Essential hypertension    History of chest pain    Low risk Cardiolite 2010   Lymphedema    Mixed hyperlipidemia    PAF (paroxysmal atrial fibrillation) (HCC)    Rheumatoid arthritis (HCC)     Family History  Problem Relation Age of Onset   Colon cancer Father    Cancer Mother    Heart disease Sister        CABG age 87   Cancer Brother     Past Surgical History:  Procedure Laterality Date   TOTAL ABDOMINAL HYSTERECTOMY  11/18/1967   Social History   Occupational History   Occupation: Retired  Tobacco Use   Smoking status: Never    Passive exposure: Never   Smokeless tobacco: Never   Tobacco comments:    Tobacco use-no  Vaping Use   Vaping status: Never Used  Substance and Sexual Activity   Alcohol use: No    Alcohol/week: 0.0 standard drinks of alcohol   Drug use: No   Sexual activity: Not on file

## 2024-01-28 ENCOUNTER — Ambulatory Visit: Payer: Medicare Other | Admitting: Family Medicine

## 2024-01-29 ENCOUNTER — Encounter: Payer: Self-pay | Admitting: Family Medicine

## 2024-02-02 ENCOUNTER — Encounter: Payer: Self-pay | Admitting: Family Medicine

## 2024-02-02 ENCOUNTER — Ambulatory Visit (INDEPENDENT_AMBULATORY_CARE_PROVIDER_SITE_OTHER): Admitting: Family Medicine

## 2024-02-02 VITALS — BP 135/61 | HR 60 | Temp 98.0°F | Ht 67.0 in | Wt 189.0 lb

## 2024-02-02 DIAGNOSIS — I480 Paroxysmal atrial fibrillation: Secondary | ICD-10-CM

## 2024-02-02 DIAGNOSIS — Z780 Asymptomatic menopausal state: Secondary | ICD-10-CM

## 2024-02-02 DIAGNOSIS — M05712 Rheumatoid arthritis with rheumatoid factor of left shoulder without organ or systems involvement: Secondary | ICD-10-CM | POA: Diagnosis not present

## 2024-02-02 DIAGNOSIS — I890 Lymphedema, not elsewhere classified: Secondary | ICD-10-CM | POA: Diagnosis not present

## 2024-02-02 DIAGNOSIS — Z7901 Long term (current) use of anticoagulants: Secondary | ICD-10-CM

## 2024-02-02 DIAGNOSIS — E559 Vitamin D deficiency, unspecified: Secondary | ICD-10-CM | POA: Diagnosis not present

## 2024-02-02 DIAGNOSIS — I10 Essential (primary) hypertension: Secondary | ICD-10-CM

## 2024-02-02 DIAGNOSIS — J449 Chronic obstructive pulmonary disease, unspecified: Secondary | ICD-10-CM

## 2024-02-02 DIAGNOSIS — M05711 Rheumatoid arthritis with rheumatoid factor of right shoulder without organ or systems involvement: Secondary | ICD-10-CM | POA: Diagnosis not present

## 2024-02-02 DIAGNOSIS — R296 Repeated falls: Secondary | ICD-10-CM

## 2024-02-02 DIAGNOSIS — I739 Peripheral vascular disease, unspecified: Secondary | ICD-10-CM

## 2024-02-02 DIAGNOSIS — E785 Hyperlipidemia, unspecified: Secondary | ICD-10-CM

## 2024-02-02 DIAGNOSIS — N1832 Chronic kidney disease, stage 3b: Secondary | ICD-10-CM | POA: Diagnosis not present

## 2024-02-02 MED ORDER — ALBUTEROL SULFATE HFA 108 (90 BASE) MCG/ACT IN AERS: 2.0000 | INHALATION_SPRAY | Freq: Four times a day (QID) | RESPIRATORY_TRACT | 2 refills | Status: DC | PRN

## 2024-02-02 NOTE — Progress Notes (Unsigned)
 Subjective:  Patient ID: Courtney Berry, female    DOB: Oct 17, 1933, 89 y.o.   MRN: [id number]  Patient Care Team: Arrie Senate, FNP as PCP - General (Family Medicine) Jonelle Sidle, MD as PCP - Cardiology (Cardiology) Kari Baars, MD as Consulting Physician (Pulmonary Disease) Jena Gauss Gerrit Friends, MD as Consulting Physician (Gastroenterology) Sharlene Dory, NP as Nurse Practitioner (Cardiology)   Chief Complaint:  Medical Management of Chronic Issues (4-5 month follow up/)  HPI: Courtney Berry is a 89 y.o. female presenting on 02/02/2024 for Medical Management of Chronic Issues (4-5 month follow up/)  HPI 1. Peripheral vascular disease (HCC) (Primary) She is established with Dr. Lajoyce Corners. She has follow up on Monday. She is planning to get pump.  States that she has not been able to sleep at night. States that it takes her more than an hour to fall asleep. She states that she will get up and do a puzzle and fall asleep in the recliner.   2. PAF (paroxysmal atrial fibrillation) (HCC) Established with Cardiology. States that she has appt coming up. She is compliant with eliquis.   3. Primary hypertension Has BP monitor at home Yes BP at home average states that it is normal. Less than 140 SBP  ROS denies anxiety, fatigue, changes to vision, chest pain, headaches, palpitations, sweats, SOB, PND, Orthopnea Continues to have numbness and tingling.  Taking ARB rather than ace due to cough.   4. Chronic obstructive pulmonary disease, unspecified COPD type (HCC) States that she's was only to follow up as needed. She is using albuterol once per night before bed. States that they are painting in her house and she has trouble breathing with it. She is able to lie down flat. Sleeps in the recliner due to legs.   5. Stage 3b chronic kidney disease (HCC) Established with Bhutani, MD. She has follow up with him soon.   6. Dyslipidemia She is not currently on a statin.    7. Lymphedema of both lower extremities She is established with Dr. Lajoyce Corners. Planning for pumps. She is taking torsemide. Reports numbness in bilateral feet.   8. Rheumatoid arthritis involving both shoulders with positive rheumatoid factor (HCC) Reports that she used to see a provider in [place], but she has not followed up in several years. Reports that her symptoms are controlled with tylenol.   9. Vitamin D deficiency She is taking B12 and possibly iron supplementation. She is unusre if she is taking vitamin D supplement.   10. Falls  Fell one month ago  Again last week, she was walking in socks and slipped in dining room. Unsure if she hit her head. She has not noticed any confusion, dizziness, or lightheadedness.   Relevant past medical, surgical, family, and social history reviewed and updated as indicated.  Allergies and medications reviewed and updated. Data reviewed: Chart in Epic.   Past Medical History:  Diagnosis Date   CKD (chronic kidney disease) stage 3, GFR 30-59 ml/min (HCC)    COPD (chronic obstructive pulmonary disease) (HCC)    Diverticulosis    Essential hypertension    History of chest pain    Low risk Cardiolite 2010   Lactic acidosis 09/03/2023   Lymphedema    Mixed hyperlipidemia    PAF (paroxysmal atrial fibrillation) (HCC)    Rheumatoid arthritis (HCC)    Severe sepsis (HCC) 09/03/2023    Past Surgical History:  Procedure Laterality Date   TOTAL ABDOMINAL HYSTERECTOMY  11/18/1967  Social History   Socioeconomic History   Marital status: Married    Spouse name: Not on file   Number of children: 2   Years of education: Not on file   Highest education level: Not on file  Occupational History   Occupation: Retired  Tobacco Use   Smoking status: Never    Passive exposure: Never   Smokeless tobacco: Never   Tobacco comments:    Tobacco use-no  Vaping Use   Vaping status: Never Used  Substance and Sexual Activity   Alcohol use: No     Alcohol/week: 0.0 standard drinks of alcohol   Drug use: No   Sexual activity: Not on file  Other Topics Concern   Not on file  Social History Narrative   Lives with her husband - one level living, handicap accessible home - Son and daughter both live very close by.   Social Drivers of Corporate investment banker Strain: Low Risk  (03/13/2023)   Overall Financial Resource Strain (CARDIA)    Difficulty of Paying Living Expenses: Not hard at all  Food Insecurity: No Food Insecurity (09/08/2023)   Hunger Vital Sign    Worried About Running Out of Food in the Last Year: Never true    Ran Out of Food in the Last Year: Never true  Transportation Needs: No Transportation Needs (09/08/2023)   PRAPARE - Administrator, Civil Service (Medical): No    Lack of Transportation (Non-Medical): No  Physical Activity: Insufficiently Active (03/13/2023)   Exercise Vital Sign    Days of Exercise per Week: 3 days    Minutes of Exercise per Session: 30 min  Stress: No Stress Concern Present (03/13/2023)   [employer] of Occupational Health - Occupational Stress Questionnaire    Feeling of Stress : Not at all  Social Connections: Moderately Integrated (03/13/2023)   Social Connection and Isolation Panel [NHANES]    Frequency of Communication with Friends and Family: More than three times a week    Frequency of Social Gatherings with Friends and Family: More than three times a week    Attends Religious Services: More than 4 times per year    Active Member of [employer] or Organizations: No    Attends Banker Meetings: Never    Marital Status: Married  Catering manager Violence: Not At Risk (09/04/2023)   Humiliation, Afraid, Rape, and Kick questionnaire    Fear of Current or Ex-Partner: No    Emotionally Abused: No    Physically Abused: No    Sexually Abused: No    Outpatient Encounter Medications as of 02/02/2024  Medication Sig   acetaminophen (TYLENOL) 500 MG tablet Take  1,000 mg by mouth every 6 (six) hours as needed for moderate pain.   amiodarone (PACERONE) 200 MG tablet TAKE 1/2 TABLET BY MOUTH DAILY   amLODipine (NORVASC) 2.5 MG tablet Take 1 tablet (2.5 mg total) by mouth daily.   cyanocobalamin 1000 MCG tablet Take 1 tablet (1,000 mcg total) by mouth daily.   ELIQUIS 5 MG TABS tablet TAKE 1 TABLET BY MOUTH TWICE A DAY   ferrous sulfate 325 (65 FE) MG tablet Take 1 tablet (325 mg total) by mouth daily with breakfast.   metoprolol tartrate (LOPRESSOR) 25 MG tablet TAKE 1/2 TABLET TWICE A DAY BY MOUTH   olmesartan (BENICAR) 20 MG tablet Take 1 tablet (20 mg total) by mouth daily.   Probiotic Product (PROBIOTIC DAILY PO) Take 1 tablet by mouth daily.  torsemide (DEMADEX) 20 MG tablet Take 1 tablet (20 mg total) by mouth daily. For Fluid (Patient taking differently: Take 20 mg by mouth daily. For Fluid REPORTS TAKES PRN)   No facility-administered encounter medications on file as of 02/02/2024.    Allergies  Allergen Reactions   Ace Inhibitors Cough   Clopidogrel Bisulfate Swelling    Face swells    Darvocet [Propoxyphene N-Acetaminophen] Other (See Comments)    unknown   Doxycycline Itching   Lasix [Furosemide]    Nitrofurantoin Monohyd Macro Other (See Comments)    unknown   Plavix [Clopidogrel Bisulfate] Swelling    Face swells   Sulfa Antibiotics Other (See Comments)    "face got puffy"   Amitriptyline Other (See Comments)    Made her "talk out of her head" and sleep until lunch the next day.   Gabapentin Other (See Comments)    Made her "talk out of her head".   Omnicef [Cefdinir] Nausea And Vomiting and Rash   Penicillins Rash    immediate rash, facial/tongue/throat swelling, SOB or lightheadedness with hypotension     Review of Systems As per HPI  Objective:  BP 135/61   Pulse 60   Temp 98 F (36.7 C)   Ht 5\' 7"  (1.702 m)   Wt 189 lb (85.7 kg)   SpO2 94%   BMI 29.60 kg/m    Wt Readings from Last 3 Encounters:  02/02/24  189 lb (85.7 kg)  09/24/23 189 lb (85.7 kg)  09/15/23 189 lb (85.7 kg)    Physical Exam Constitutional:      General: She is awake. She is not in acute distress.    Appearance: Normal appearance. She is well-developed and well-groomed. She is obese. She is not ill-appearing, toxic-appearing or diaphoretic.  Cardiovascular:     Rate and Rhythm: Normal rate and regular rhythm.     Pulses: Normal pulses.     Heart sounds: Normal heart sounds.  Pulmonary:     Effort: Pulmonary effort is normal.     Breath sounds: Decreased breath sounds present. No wheezing, rhonchi or rales.  Musculoskeletal:     Right lower leg: 3+ Edema present.     Left lower leg: 3+ Edema present.     Comments: Generalized weakness, using wheelchair  Skin:    General: Skin is warm.     Capillary Refill: Capillary refill takes less than 2 seconds.     Coloration: Skin is pale.  Neurological:     General: No focal deficit present.     Mental Status: She is alert, oriented to person, place, and time and easily aroused. Mental status is at baseline.  Psychiatric:        Attention and Perception: Attention and perception normal.        Mood and Affect: Mood and affect normal.        Speech: Speech normal.        Behavior: Behavior normal. Behavior is cooperative.        Thought Content: Thought content normal.        Cognition and Memory: Cognition and memory normal.        Judgment: Judgment normal.     Results for orders placed or performed in visit on 09/15/23  CBC with Differential/Platelet   Collection Time: 09/15/23  3:34 PM  Result Value Ref Range   WBC 6.8 3.4 - 10.8 x10E3/uL   RBC 3.53 (L) 3.77 - 5.28 x10E6/uL   Hemoglobin 11.2 11.1 - 15.9 g/dL  Hematocrit 34.7 34.0 - 46.6 %   MCV 98 (H) 79 - 97 fL   MCH 31.7 26.6 - 33.0 pg   MCHC 32.3 31.5 - 35.7 g/dL   RDW 44.0 (L) 34.7 - 42.5 %   Platelets 163 150 - 450 x10E3/uL   Neutrophils 55 Not Estab. %   Lymphs 30 Not Estab. %   Monocytes 7 Not  Estab. %   Eos 7 Not Estab. %   Basos 1 Not Estab. %   Neutrophils Absolute 3.8 1.4 - 7.0 x10E3/uL   Lymphocytes Absolute 2.0 0.7 - 3.1 x10E3/uL   Monocytes Absolute 0.5 0.1 - 0.9 x10E3/uL   EOS (ABSOLUTE) 0.5 (H) 0.0 - 0.4 x10E3/uL   Basophils Absolute 0.1 0.0 - 0.2 x10E3/uL   Immature Granulocytes 0 Not Estab. %   Immature Grans (Abs) 0.0 0.0 - 0.1 x10E3/uL  CMP14+EGFR   Collection Time: 09/15/23  3:34 PM  Result Value Ref Range   Glucose 118 (H) 70 - 99 mg/dL   BUN 29 (H) 8 - 27 mg/dL   Creatinine, Ser 9.56 (H) 0.57 - 1.00 mg/dL   eGFR 31 (L) >38 VF/IEP/3.29   BUN/Creatinine Ratio 18 12 - 28   Sodium 143 134 - 144 mmol/L   Potassium 4.5 3.5 - 5.2 mmol/L   Chloride 100 96 - 106 mmol/L   CO2 29 20 - 29 mmol/L   Calcium 9.0 8.7 - 10.3 mg/dL   Total Protein 7.0 6.0 - 8.5 g/dL   Albumin 3.5 (L) 3.7 - 4.7 g/dL   Globulin, Total 3.5 1.5 - 4.5 g/dL   Bilirubin Total 0.3 0.0 - 1.2 mg/dL   Alkaline Phosphatase 112 44 - 121 IU/L   AST 18 0 - 40 IU/L   ALT 8 0 - 32 IU/L       02/02/2024    3:10 PM 09/15/2023    3:05 PM 06/04/2023    3:13 PM 04/17/2023    2:06 PM 03/13/2023   11:18 AM  Depression screen PHQ 2/9  Decreased Interest 0 0 0 0 0  Down, Depressed, Hopeless 0 0 0 0 0  PHQ - 2 Score 0 0 0 0 0  Altered sleeping 2 1 1   0  Tired, decreased energy 1 1 1   0  Change in appetite 0 0 0  0  Feeling bad or failure about yourself  0 0 0  0  Trouble concentrating 0 0 0  0  Moving slowly or fidgety/restless 0 0 0  0  Suicidal thoughts 0 0 0  0  PHQ-9 Score 3 2 2   0  Difficult doing work/chores  Extremely dIfficult Somewhat difficult  Not difficult at all       02/02/2024    3:11 PM 09/15/2023    3:05 PM 06/04/2023    3:14 PM 01/12/2023    2:06 PM  GAD 7 : Generalized Anxiety Score  Nervous, Anxious, on Edge 0 0 0 0  Control/stop worrying 0 0 0 0  Worry too much - different things 0 0 1 0  Trouble relaxing 1 0 0 0  Restless 0 0 0 0  Easily annoyed or irritable 0 0 0 0   Afraid - awful might happen 0 0 0 0  Total GAD 7 Score 1 0 1 0  Anxiety Difficulty Somewhat difficult Not difficult at all Somewhat difficult    Pertinent labs & imaging results that were available during my care of the patient were reviewed by me and  considered in my medical decision making.  Assessment & Plan:  Courtney Berry was seen today for medical management of chronic issues.  Diagnoses and all orders for this visit:  1. Peripheral vascular disease (HCC) (Primary) Established with Cardiology and orthopedics. Reviewed notes from [place], MD 01/11/24. Recommend patient follow up with specialty.   2. PAF (paroxysmal atrial fibrillation) (HCC) Established with Cardiology. Reviewed notes from [place], MD 09/14/23. Patient to continue to follow with specialty.   3. Chronic obstructive pulmonary disease, unspecified COPD type (HCC) Reviewed notes from [place], MD 09/24/23. Patient to continue current regimen.  - albuterol (VENTOLIN HFA) 108 (90 Base) MCG/ACT inhaler; Inhale 2 puffs into the lungs every 6 (six) hours as needed for wheezing or shortness of breath.  Dispense: 8 g; Refill: 2  4. Stage 3b chronic kidney disease (HCC) Established with Wolfgang [place], MD. Continue current regimen. Labs as below. Will communicate results to patient once available. Will await results to determine next steps.  - Anemia Profile B - CMP14+EGFR  5. Primary hypertension Slightly above goal. Will not change regimen at this time. Patient to continue to monitor at home.   6. Dyslipidemia Last lipids (08/29/2020) were within normal range. Patient is outside of screening age. Will defer to cardiology to determine need for statin.   7. Lymphedema of both lower extremities Reviewed notes from [place], MD 01/11/24. Recommend patient follow up with specialty.   8. Rheumatoid arthritis involving both shoulders with positive rheumatoid factor (HCC) Well controlled. Continue current regimen.   9. Chronic  anticoagulation Continue current regimen.  - Anemia Profile B  10. Vitamin D deficiency Based on labs, refill provided.  - VITAMIN D 25 Hydroxy (Vit-D Deficiency, Fractures) - Vitamin D, Ergocalciferol, (DRISDOL) 1.25 MG (50000 UNIT) CAPS capsule; Take 1 capsule (50,000 Units total) by mouth every 7 (seven) days for 12 doses.  Dispense: 12 capsule; Refill: 0  11. Postmenopausal estrogen deficiency Imaging as below. Will communicate results to patient once available. Will await results to determine next steps.  - DG [facility] DEXA  12. Falls Discussed with patient safe transitions at home. Encouraged her to follow up in person if she falls and to present to ED if she hits her head as she is on anticoagulation   Continue all other maintenance medications.  Follow up plan: Return in about 6 months (around 08/04/2024) for Chronic Condition Follow up.   Continue healthy lifestyle choices, including diet (rich in fruits, vegetables, and lean proteins, and low in salt and simple carbohydrates) and exercise (at least 30 minutes of moderate physical activity daily).  Written and verbal instructions provided   The above assessment and management plan was discussed with the patient. The patient verbalized understanding of and has agreed to the management plan. Patient is aware to call the clinic if they develop any new symptoms or if symptoms persist or worsen. Patient is aware when to return to the clinic for a follow-up visit. Patient educated on when it is appropriate to go to the emergency department.   Neale Burly, DNP-FNP [facility] [address] [place], [place] [address] [phone]

## 2024-02-03 LAB — CMP14+EGFR
ALT: 10 IU/L (ref 0–32)
AST: 13 IU/L (ref 0–40)
Albumin: 3.3 g/dL — ABNORMAL LOW (ref 3.7–4.7)
Alkaline Phosphatase: 112 IU/L (ref 44–121)
BUN/Creatinine Ratio: 18 (ref 12–28)
BUN: 23 mg/dL (ref 8–27)
Bilirubin Total: 0.3 mg/dL (ref 0.0–1.2)
CO2: 28 mmol/L (ref 20–29)
Calcium: 9.1 mg/dL (ref 8.7–10.3)
Chloride: 103 mmol/L (ref 96–106)
Creatinine, Ser: 1.27 mg/dL — ABNORMAL HIGH (ref 0.57–1.00)
Globulin, Total: 3.4 g/dL (ref 1.5–4.5)
Glucose: 88 mg/dL (ref 70–99)
Potassium: 5 mmol/L (ref 3.5–5.2)
Sodium: 143 mmol/L (ref 134–144)
Total Protein: 6.7 g/dL (ref 6.0–8.5)
eGFR: 40 mL/min/1.73 — ABNORMAL LOW

## 2024-02-03 LAB — ANEMIA PROFILE B
Basophils Absolute: 0 10*3/uL (ref 0.0–0.2)
Basos: 0 %
EOS (ABSOLUTE): 0.4 10*3/uL (ref 0.0–0.4)
Eos: 5 %
Ferritin: 118 ng/mL (ref 15–150)
Folate: 12.3 ng/mL (ref >3.0)
Hematocrit: 37.8 % (ref 34.0–46.6)
Hemoglobin: 12.1 g/dL (ref 11.1–15.9)
Immature Grans (Abs): 0 10*3/uL (ref 0.0–0.1)
Immature Granulocytes: 0 %
Iron Saturation: 48 % (ref 15–55)
Iron: 122 ug/dL (ref 27–139)
Lymphocytes Absolute: 2.6 10*3/uL (ref 0.7–3.1)
Lymphs: 33 %
MCH: 32.5 pg (ref 26.6–33.0)
MCHC: 32 g/dL (ref 31.5–35.7)
MCV: 102 fL — ABNORMAL HIGH (ref 79–97)
Monocytes Absolute: 0.8 10*3/uL (ref 0.1–0.9)
Monocytes: 11 %
Neutrophils Absolute: 3.9 10*3/uL (ref 1.4–7.0)
Neutrophils: 51 %
Platelets: 192 10*3/uL (ref 150–450)
RBC: 3.72 x10E6/uL — ABNORMAL LOW (ref 3.77–5.28)
RDW: 11.8 % (ref 11.7–15.4)
Retic Ct Pct: 1.3 % (ref 0.6–2.6)
Total Iron Binding Capacity: 253 ug/dL (ref 250–450)
UIBC: 131 ug/dL (ref 118–369)
Vitamin B-12: 446 pg/mL (ref 232–1245)
WBC: 7.7 10*3/uL (ref 3.4–10.8)

## 2024-02-03 LAB — VITAMIN D 25 HYDROXY (VIT D DEFICIENCY, FRACTURES): Vit D, 25-Hydroxy: 16.8 ng/mL — ABNORMAL LOW (ref 30.0–100.0)

## 2024-02-03 MED ORDER — VITAMIN D (ERGOCALCIFEROL) 1.25 MG (50000 UNIT) PO CAPS: 50000.0000 [IU] | ORAL_CAPSULE | ORAL | 0 refills | Status: AC

## 2024-02-03 NOTE — Progress Notes (Signed)
 CBC stable. Not anemic. CMP stable. Continue to follow up with [facility], recommend increasing protein in diet. Vitamin D level is low. I have sent in a weekly supplement to take for the next 12 weeks. After that, take a daily OTC vitamin D supplement with 1000-2000 IU.

## 2024-02-08 ENCOUNTER — Ambulatory Visit: Payer: Medicare Other | Admitting: Orthopedic Surgery

## 2024-02-18 ENCOUNTER — Ambulatory Visit: Admitting: Orthopedic Surgery

## 2024-03-07 ENCOUNTER — Encounter: Payer: Self-pay | Admitting: Orthopedic Surgery

## 2024-03-07 ENCOUNTER — Ambulatory Visit: Admitting: Orthopedic Surgery

## 2024-03-07 DIAGNOSIS — I890 Lymphedema, not elsewhere classified: Secondary | ICD-10-CM

## 2024-03-07 DIAGNOSIS — I872 Venous insufficiency (chronic) (peripheral): Secondary | ICD-10-CM

## 2024-03-07 NOTE — Progress Notes (Signed)
 Office Visit Note   Patient: Courtney Berry           Date of Birth: 11-15-1933           MRN: [id number] Visit Date: 03/07/2024              Requested by: Chrystine Crate, FNP [address] [place],  [place] [address] PCP: Chrystine Crate, FNP  Chief Complaint  Patient presents with   Right Leg - Follow-up   Left Leg - Follow-up      HPI: Patient was seen today for demonstration of her lymphedema pumps.  Assessment & Plan: Visit Diagnoses:  1. Venous stasis dermatitis   2. Lymphedema     Plan: Patient will follow-up if she develops any ulcers or problems with her legs.  Follow-Up Instructions: Return if symptoms worsen or fail to improve.   Ortho Exam  Patient is alert, oriented, no adenopathy, well-dressed, normal affect, normal respiratory effort. No open ulcers on either leg.  Patient was seen today for in-service for the lymphedema pumps.  Imaging: No results found. No images are attached to the encounter.  Labs: Lab Results  Component Value Date   HGBA1C 5.4 01/06/2023   ESRSEDRATE 36 02/04/2018   LABURIC 7.1 02/04/2018   REPTSTATUS 09/08/2023 FINAL 09/03/2023   REPTSTATUS 09/08/2023 FINAL 09/03/2023   CULT  09/03/2023    NO GROWTH 5 DAYS Performed at [facility], [address] [place], [place] [address]    CULT  09/03/2023    NO GROWTH 5 DAYS Performed at [facility], [address] [place], [place] [address]    Joe Murders ENTEROCOCCUS FAECALIS (A) 10/26/2019     Lab Results  Component Value Date   ALBUMIN 3.3 (L) 02/02/2024   ALBUMIN 3.5 (L) 09/15/2023   ALBUMIN 3.2 (L) 09/03/2023    Lab Results  Component Value Date   MG 1.9 09/04/2023   MG 1.7 10/26/2019   MG 1.7 08/02/2018   Lab Results  Component Value Date   VD25OH 16.8 (L) 02/02/2024    No results found for: "PREALBUMIN"    Latest Ref Rng & Units 02/02/2024    3:28 PM 09/15/2023    3:34 PM 09/05/2023    4:47 AM  CBC EXTENDED  WBC 3.4 - 10.8  x10E3/uL 7.7  6.8  9.3   RBC 3.77 - 5.28 x10E6/uL 3.72  3.53  3.08   Hemoglobin 11.1 - 15.9 g/dL 10.2  72.5  9.7   HCT 36.6 - 46.6 % 37.8  34.7  32.0   Platelets 150 - 450 x10E3/uL 192  163  126   NEUT# 1.4 - 7.0 x10E3/uL 3.9  3.8  6.1   Lymph# 0.7 - 3.1 x10E3/uL 2.6  2.0  1.8      There is no height or weight on file to calculate BMI.  Orders:  No orders of the defined types were placed in this encounter.  No orders of the defined types were placed in this encounter.    Procedures: No procedures performed  Clinical Data: No additional findings.  ROS:  All other systems negative, except as noted in the HPI. Review of Systems  Objective: Vital Signs: There were no vitals taken for this visit.  Specialty Comments:  No specialty comments available.  PMFS History: Patient Active Problem List   Diagnosis Date Noted   Cellulitis of lower extremity 09/03/2023   Atrial fibrillation, chronic (HCC) 09/03/2023   Macrocytic anemia 09/03/2023   Hyperglycemia 09/03/2023  Asthmatic bronchitis 08/06/2023   Clostridium difficile diarrhea 01/24/2023   Coronary atherosclerosis 01/23/2023   Weakness 01/23/2023   CKD (chronic kidney disease) stage 3, GFR 30-59 ml/min (HCC) 05/29/2022   Secondary hypercoagulable state (HCC) 01/11/2021   Nausea & vomiting 11/22/2019   Neuropathy 03/02/2019   PAF (paroxysmal atrial fibrillation) (HCC) 08/01/2018   Chronic obstructive pulmonary disease (HCC) 08/01/2018   Lymphedema of both lower extremities 02/09/2017   Rheumatoid arthritis (HCC) 10/20/2016   Peripheral vascular disease (HCC) 10/20/2016   Chronic anticoagulation 02/14/2016   Dyslipidemia 09/03/2009   Essential hypertension 09/03/2009   Past Medical History:  Diagnosis Date   CKD (chronic kidney disease) stage 3, GFR 30-59 ml/min (HCC)    COPD (chronic obstructive pulmonary disease) (HCC)    Diverticulosis    Essential hypertension    History of chest pain    Low risk  Cardiolite 2010   Lactic acidosis 09/03/2023   Lymphedema    Mixed hyperlipidemia    PAF (paroxysmal atrial fibrillation) (HCC)    Rheumatoid arthritis (HCC)    Severe sepsis (HCC) 09/03/2023    Family History  Problem Relation Age of Onset   Colon cancer Father    Cancer Mother    Heart disease Sister        CABG age 55   Cancer Brother     Past Surgical History:  Procedure Laterality Date   TOTAL ABDOMINAL HYSTERECTOMY  11/18/1967   Social History   Occupational History   Occupation: Retired  Tobacco Use   Smoking status: Never    Passive exposure: Never   Smokeless tobacco: Never   Tobacco comments:    Tobacco use-no  Vaping Use   Vaping status: Never Used  Substance and Sexual Activity   Alcohol use: No    Alcohol/week: 0.0 standard drinks of alcohol   Drug use: No   Sexual activity: Not on file

## 2024-03-09 DIAGNOSIS — I890 Lymphedema, not elsewhere classified: Secondary | ICD-10-CM | POA: Diagnosis not present

## 2024-03-09 DIAGNOSIS — I872 Venous insufficiency (chronic) (peripheral): Secondary | ICD-10-CM | POA: Diagnosis not present

## 2024-03-14 ENCOUNTER — Encounter: Payer: Self-pay | Admitting: Cardiology

## 2024-03-14 ENCOUNTER — Ambulatory Visit: Payer: Medicare Other | Attending: Cardiology | Admitting: Cardiology

## 2024-03-14 VITALS — BP 126/82 | HR 60 | Ht 67.0 in | Wt 189.0 lb

## 2024-03-14 DIAGNOSIS — I4819 Other persistent atrial fibrillation: Secondary | ICD-10-CM | POA: Diagnosis not present

## 2024-03-14 DIAGNOSIS — I890 Lymphedema, not elsewhere classified: Secondary | ICD-10-CM

## 2024-03-14 DIAGNOSIS — N1832 Chronic kidney disease, stage 3b: Secondary | ICD-10-CM | POA: Diagnosis not present

## 2024-03-14 DIAGNOSIS — I10 Essential (primary) hypertension: Secondary | ICD-10-CM

## 2024-03-14 NOTE — Patient Instructions (Addendum)

## 2024-03-14 NOTE — Progress Notes (Signed)
    Cardiology Office Note  Date: 03/14/2024   ID: [name], DOB 1933-05-09, MRN [id number]  History of Present Illness: Courtney Berry is an 89 y.o. female last seen in October 2024.  She is here today for a routine visit.  Reports no palpitations or chest discomfort.  She just started using home mechanical compression for treatment of lymphedema.  Weight has been stable and she has had improvement in prior orthostatic symptoms since reducing Norvasc .  We went over her medications.  She does not describe any spontaneous bleeding problems on Eliquis .  Reports compliance with her medications.  I reviewed her lab work which is noted below.  Physical Exam: VS:  BP 126/82   Pulse 60   Ht 5\' 7"  (1.702 m)   Wt 189 lb (85.7 kg)   SpO2 95%   BMI 29.60 kg/m , BMI Body mass index is 29.6 kg/m.  Wt Readings from Last 3 Encounters:  03/14/24 189 lb (85.7 kg)  02/02/24 189 lb (85.7 kg)  09/24/23 189 lb (85.7 kg)    General: Patient appears comfortable at rest. HEENT: Conjunctiva and lids normal. Neck: Supple, no elevated JVP.Courtney Berry Lungs: Clear to auscultation, nonlabored breathing at rest. Cardiac: Regular rate and rhythm, no S3, 1/6 systolic murmur. Extremities: Lymphedema with compression stockings in place.  ECG:  An ECG dated 09/03/2023 was personally reviewed today and demonstrated:  Sinus rhythm with nonspecific ST changes.  Labwork: 09/04/2023: Magnesium  1.9 02/02/2024: ALT 10; AST 13; BUN 23; Creatinine, Ser 1.27; Hemoglobin 12.1; Platelets 192; Potassium 5.0; Sodium 143   Other Studies Reviewed Today:  No interval cardiac testing for review today.  Assessment and Plan:  1.  Persistent atrial fibrillation, CHA2DS2-VASc score of 4.  Heart rate regular today and she reports no interval palpitations.  Continue Eliquis  5 mg twice daily for stroke prophylaxis.  She is also on amiodarone  100 mg daily and Lopressor  12.5 mg twice daily.   2.  Primary hypertension.  Continue  Benicar  20 mg daily and Norvasc  2.5 mg daily.   3.  Lymphedema..  Now using mechanical compression pump at home.  Additionally she is on Demadex  20 mg daily.   4.  CKD stage IIIb, creatinine 1.27 with GFR 40 in March.  Disposition:  Follow up  6 months.  Signed, Gerard Knight, M.D., F.A.C.C.  [facility] at [facility]

## 2024-03-16 ENCOUNTER — Ambulatory Visit: Payer: Medicare Other

## 2024-03-16 VITALS — BP 126/82 | HR 60 | Ht 67.0 in | Wt 189.0 lb

## 2024-03-16 DIAGNOSIS — Z0000 Encounter for general adult medical examination without abnormal findings: Secondary | ICD-10-CM

## 2024-03-16 NOTE — Progress Notes (Signed)
 Subjective:   Courtney Berry is a 89 y.o. who presents for a Medicare Wellness preventive visit.  Visit Complete: Virtual I connected with  Courtney Berry on 03/16/24 by a audio enabled telemedicine application and verified that I am speaking with the correct person using two identifiers.  Patient Location: Home  Provider Location: Home Office  I discussed the limitations of evaluation and management by telemedicine. The patient expressed understanding and agreed to proceed.  Vital Signs: Because this visit was a virtual/telehealth visit, some criteria may be missing or patient reported. Any vitals not documented were not able to be obtained and vitals that have been documented are patient reported.  VideoDeclined- This patient declined Librarian, academic. Therefore the visit was completed with audio only.  Persons Participating in Visit: Patient.  AWV Questionnaire: No: Patient Medicare AWV questionnaire was not completed prior to this visit.  Cardiac Risk Factors include: advanced age (>52men, >31 women);dyslipidemia;hypertension (PAF)     Objective:    Today's Vitals   03/16/24 1407  BP: 126/82  Pulse: 60  Weight: 189 lb (85.7 kg)  Height: 5\' 7"  (1.702 m)   Body mass index is 29.6 kg/m.     03/16/2024    2:19 PM 09/04/2023    2:23 PM 09/03/2023    3:24 PM 04/11/2023    6:46 PM 03/13/2023   11:18 AM 03/11/2022   11:42 AM 03/07/2021   10:14 AM  Advanced Directives  Does Patient Have a Medical Advance Directive? Yes Yes Yes Yes Yes Yes Yes  Type of Estate agent of [place];Living will Healthcare Power of [place];Living will Healthcare Power of [place];Living will  Healthcare Power of [place];Living will Living will;Healthcare Power of [employer] Power of Attorney  Does patient want to make changes to medical advance directive?  No - Patient declined       Copy of Healthcare Power of Attorney in Chart? No -  copy requested No - copy requested   No - copy requested No - copy requested Yes - validated most recent copy scanned in chart (See row information)    Current Medications (verified) Outpatient Encounter Medications as of 03/16/2024  Medication Sig   acetaminophen  (TYLENOL ) 500 MG tablet Take 1,000 mg by mouth every 6 (six) hours as needed for moderate pain.   albuterol  (VENTOLIN  HFA) 108 (90 Base) MCG/ACT inhaler Inhale 2 puffs into the lungs every 6 (six) hours as needed for wheezing or shortness of breath.   amiodarone  (PACERONE ) 200 MG tablet TAKE 1/2 TABLET BY MOUTH DAILY   amLODipine  (NORVASC ) 2.5 MG tablet Take 1 tablet (2.5 mg total) by mouth daily.   cyanocobalamin  1000 MCG tablet Take 1 tablet (1,000 mcg total) by mouth daily.   ELIQUIS  5 MG TABS tablet TAKE 1 TABLET BY MOUTH TWICE A DAY   ferrous sulfate  325 (65 FE) MG tablet Take 1 tablet (325 mg total) by mouth daily with breakfast.   metoprolol  tartrate (LOPRESSOR ) 25 MG tablet TAKE 1/2 TABLET TWICE A DAY BY MOUTH   olmesartan  (BENICAR ) 20 MG tablet Take 1 tablet (20 mg total) by mouth daily.   Probiotic Product (PROBIOTIC DAILY PO) Take 1 tablet by mouth daily.   torsemide  (DEMADEX ) 20 MG tablet Take 1 tablet (20 mg total) by mouth daily. For Fluid   Vitamin D , Ergocalciferol , (DRISDOL ) 1.25 MG (50000 UNIT) CAPS capsule Take 1 capsule (50,000 Units total) by mouth every 7 (seven) days for 12 doses.   No facility-administered  encounter medications on file as of 03/16/2024.    Allergies (verified) Ace inhibitors, Clopidogrel bisulfate, Darvocet [propoxyphene n-acetaminophen ], Doxycycline , Lasix  [furosemide ], Nitrofurantoin monohyd macro, Plavix [clopidogrel bisulfate], Sulfa antibiotics, Amitriptyline , Gabapentin , Omnicef  [cefdinir ], and Penicillins   History: Past Medical History:  Diagnosis Date   CKD (chronic kidney disease) stage 3, GFR 30-59 ml/min (HCC)    COPD (chronic obstructive pulmonary disease) (HCC)     Diverticulosis    Essential hypertension    History of chest pain    Low risk Cardiolite 2010   Lactic acidosis 09/03/2023   Lymphedema    Mixed hyperlipidemia    PAF (paroxysmal atrial fibrillation) (HCC)    Rheumatoid arthritis (HCC)    Severe sepsis (HCC) 09/03/2023   Past Surgical History:  Procedure Laterality Date   TOTAL ABDOMINAL HYSTERECTOMY  11/18/1967   Family History  Problem Relation Age of Onset   Colon cancer Father    Cancer Mother    Heart disease Sister        CABG age 55   Cancer Brother    Social History   Socioeconomic History   Marital status: Married    Spouse name: Not on file   Number of children: 2   Years of education: Not on file   Highest education level: Not on file  Occupational History   Occupation: Retired  Tobacco Use   Smoking status: Never    Passive exposure: Never   Smokeless tobacco: Never   Tobacco comments:    Tobacco use-no  Vaping Use   Vaping status: Never Used  Substance and Sexual Activity   Alcohol use: No    Alcohol/week: 0.0 standard drinks of alcohol   Drug use: No   Sexual activity: Not on file  Other Topics Concern   Not on file  Social History Narrative   Lives with her husband - one level living, handicap accessible home - Son and daughter both live very close by.   Social Drivers of Corporate investment banker Strain: Low Risk  (03/16/2024)   Overall Financial Resource Strain (CARDIA)    Difficulty of Paying Living Expenses: Not hard at all  Food Insecurity: No Food Insecurity (03/16/2024)   Hunger Vital Sign    Worried About Running Out of Food in the Last Year: Never true    Ran Out of Food in the Last Year: Never true  Transportation Needs: No Transportation Needs (03/16/2024)   PRAPARE - Administrator, Civil Service (Medical): No    Lack of Transportation (Non-Medical): No  Physical Activity: Insufficiently Active (03/16/2024)   Exercise Vital Sign    Days of Exercise per Week: 7 days     Minutes of Exercise per Session: 10 min  Stress: No Stress Concern Present (03/16/2024)   [employer] of Occupational Health - Occupational Stress Questionnaire    Feeling of Stress : Not at all  Social Connections: Moderately Integrated (03/16/2024)   Social Connection and Isolation Panel [NHANES]    Frequency of Communication with Friends and Family: More than three times a week    Frequency of Social Gatherings with Friends and Family: More than three times a week    Attends Religious Services: More than 4 times per year    Active Member of [employer] or Organizations: No    Attends Banker Meetings: Never    Marital Status: Married    Tobacco Counseling Counseling given: Yes Tobacco comments: Tobacco use-no    Clinical Intake:  Pre-visit preparation  completed: Yes  Pain : No/denies pain     BMI - recorded: 29.6 Nutritional Status: BMI 25 -29 Overweight Nutritional Risks: None Diabetes: No  Lab Results  Component Value Date   HGBA1C 5.4 01/06/2023     How often do you need to have someone help you when you read instructions, pamphlets, or other written materials from your doctor or pharmacy?: 1 - Never  Interpreter Needed?: No  Information entered by :: Alia t/cma   Activities of Daily Living     03/16/2024    2:14 PM 09/04/2023    2:23 PM  In your present state of health, do you have any difficulty performing the following activities:  Hearing? 0 0  Vision? 1 0  Comment pt needs glasses put on   Difficulty concentrating or making decisions? 0 0  Walking or climbing stairs? 0   Dressing or bathing? 0   Doing errands, shopping? 1 1  Comment pt's takes pt to and from the dr's office   Preparing Food and eating ? N   Using the Toilet? N   In the past six months, have you accidently leaked urine? N   Do you have problems with loss of bowel control? N   Managing your Medications? N   Managing your Finances? N   Housekeeping or managing  your Housekeeping? N     Patient Care Team: Milian, Winda Hastings, FNP as PCP - General (Family Medicine) Gerard Knight, MD as PCP - Cardiology (Cardiology) Mariea Shook, MD as Consulting Physician (Pulmonary Disease) Suzette Espy, MD as Consulting Physician (Gastroenterology) Lasalle Pointer, NP as Nurse Practitioner (Cardiology)  Indicate any recent Medical Services you may have received from other than [facility] providers in the past year (date may be approximate).     Assessment:   This is a routine wellness examination for [place].  Hearing/Vision screen Hearing Screening - Comments:: Pt denies hearing dif  Vision Screening - Comments:: Pt needs her glasses to read/pt going [employer] in [place], [place]   Goals Addressed             This Visit's Progress    DIET - INCREASE WATER INTAKE   On track      Depression Screen     03/16/2024    2:14 PM 02/02/2024    3:10 PM 09/15/2023    3:05 PM 06/04/2023    3:13 PM 04/17/2023    2:06 PM 03/13/2023   11:18 AM 02/12/2023    3:19 PM  PHQ 2/9 Scores  PHQ - 2 Score 0 0 0 0 0 0 0  PHQ- 9 Score 0 3 2 2   0 2    Fall Risk     03/16/2024    2:12 PM 02/02/2024    3:10 PM 09/15/2023    3:05 PM 06/04/2023    3:14 PM 04/17/2023    2:06 PM  Fall Risk   Falls in the past year? 1 1 0 0 0  Number falls in past yr: 0 0 0 0   Injury with Fall? 0 0 0 0   Risk for fall due to : Impaired balance/gait;Impaired mobility Impaired balance/gait;Impaired mobility No Fall Risks No Fall Risks   Follow up Falls prevention discussed;Falls evaluation completed Falls evaluation completed Falls evaluation completed Falls evaluation completed     MEDICARE RISK AT HOME:  Medicare Risk at Home Any stairs in or around the home?: Yes If so, are there any without handrails?: Yes Home free of loose  throw rugs in walkways, pet beds, electrical cords, etc?: Yes Adequate lighting in your home to reduce risk of falls?: Yes Life alert?: No Use of a cane,  walker or w/c?: Yes (walker/wc) Grab bars in the bathroom?: Yes Shower chair or bench in shower?: Yes Elevated toilet seat or a handicapped toilet?: Yes  TIMED UP AND GO:  Was the test performed?  no  Cognitive Function: 6CIT completed    03/10/2018   11:37 AM  MMSE - Mini Mental State Exam  Orientation to time 5  Orientation to Place 4  Registration 3  Attention/ Calculation 3  Recall 2  Language- name 2 objects 2  Language- repeat 0  Language- follow 3 step command 3  Language- read & follow direction 1  Write a sentence 1  Copy design 1  Total score 25        03/16/2024    2:23 PM 03/13/2023   11:19 AM 03/11/2022   11:30 AM  6CIT Screen  What Year? 0 points 0 points 0 points  What month? 0 points 0 points 0 points  What time? 0 points 0 points 0 points  Count back from 20 0 points 0 points 0 points  Months in reverse 0 points 0 points 0 points  Repeat phrase 0 points 0 points 8 points  Total Score 0 points 0 points 8 points    Immunizations  There is no immunization history on file for this patient.  Screening Tests Health Maintenance  Topic Date Due   DEXA SCAN  Never done   Zoster Vaccines- Shingrix (1 of 2) 05/04/2024 (Originally 01/16/1952)   Pneumonia Vaccine 55+ Years old (1 of 2 - PCV) 02/01/2025 (Originally 01/16/1952)   COVID-19 Vaccine (1) 04/01/2025 (Originally 01/17/1938)   INFLUENZA VACCINE  06/17/2024   Medicare Annual Wellness (AWV)  03/16/2025   HPV VACCINES  Aged Out   Meningococcal B Vaccine  Aged Out   DTaP/Tdap/Td  Discontinued    Health Maintenance  Health Maintenance Due  Topic Date Due   DEXA SCAN  Never done   Health Maintenance Items Addressed: See Nurse Notes  Additional Screening:  Vision Screening: Recommended annual ophthalmology exams for early detection of glaucoma and other disorders of the eye.  Dental Screening: Recommended annual dental exams for proper oral hygiene  Community Resource Referral / Chronic Care  Management: CRR required this visit?  No   CCM required this visit?  No     Plan:     I have personally reviewed and noted the following in the patient's chart:   Medical and social history Use of alcohol, tobacco or illicit drugs  Current medications and supplements including opioid prescriptions. Patient is not currently taking opioid prescriptions. Functional ability and status Nutritional status Physical activity Advanced directives List of other physicians Hospitalizations, surgeries, and ER visits in previous 12 months Vitals Screenings to include cognitive, depression, and falls Referrals and appointments  In addition, I have reviewed and discussed with patient certain preventive protocols, quality metrics, and best practice recommendations. A written personalized care plan for preventive services as well as general preventive health recommendations were provided to patient.     Michaelle Adolphus, CMA   03/16/2024   After Visit Summary: (Declined) Due to this being a telephonic visit, with patients personalized plan was offered to patient but patient Declined AVS at this time   Notes: Nothing significant to report at this time.

## 2024-03-16 NOTE — Patient Instructions (Signed)
 [name] , Thank you for taking time to come for your Medicare Wellness Visit. I appreciate your ongoing commitment to your health goals. Please review the following plan we discussed and let me know if I can assist you in the future.   Referrals/Orders/Follow-Ups/Clinician Recommendations: n/a  This is a list of the screening recommended for you and due dates:  Health Maintenance  Topic Date Due   DEXA scan (bone density measurement)  Never done   Zoster (Shingles) Vaccine (1 of 2) 05/04/2024*   Pneumonia Vaccine (1 of 2 - PCV) 02/01/2025*   COVID-19 Vaccine (1) 04/01/2025*   Flu Shot  06/17/2024   Medicare Annual Wellness Visit  03/16/2025   HPV Vaccine  Aged Out   Meningitis B Vaccine  Aged Out   DTaP/Tdap/Td vaccine  Discontinued  *Topic was postponed. The date shown is not the original due date.    Advanced directives: (Copy Requested) Please bring a copy of your health care power of attorney and living will to the office to be added to your chart at your convenience. You can mail to [facility] [address] [place], [place] [address] or email to [email or web address]  Next Medicare Annual Wellness Visit scheduled for next year: Yes

## 2024-03-17 ENCOUNTER — Encounter: Payer: Self-pay | Admitting: *Deleted

## 2024-03-23 DIAGNOSIS — D631 Anemia in chronic kidney disease: Secondary | ICD-10-CM | POA: Diagnosis not present

## 2024-03-23 DIAGNOSIS — E211 Secondary hyperparathyroidism, not elsewhere classified: Secondary | ICD-10-CM | POA: Diagnosis not present

## 2024-03-23 DIAGNOSIS — R809 Proteinuria, unspecified: Secondary | ICD-10-CM | POA: Diagnosis not present

## 2024-03-23 DIAGNOSIS — D649 Anemia, unspecified: Secondary | ICD-10-CM | POA: Diagnosis not present

## 2024-03-23 DIAGNOSIS — N189 Chronic kidney disease, unspecified: Secondary | ICD-10-CM | POA: Diagnosis not present

## 2024-03-30 DIAGNOSIS — E559 Vitamin D deficiency, unspecified: Secondary | ICD-10-CM | POA: Diagnosis not present

## 2024-03-30 DIAGNOSIS — I129 Hypertensive chronic kidney disease with stage 1 through stage 4 chronic kidney disease, or unspecified chronic kidney disease: Secondary | ICD-10-CM | POA: Diagnosis not present

## 2024-03-30 DIAGNOSIS — N1832 Chronic kidney disease, stage 3b: Secondary | ICD-10-CM | POA: Diagnosis not present

## 2024-03-30 DIAGNOSIS — N2581 Secondary hyperparathyroidism of renal origin: Secondary | ICD-10-CM | POA: Diagnosis not present

## 2024-04-03 ENCOUNTER — Other Ambulatory Visit: Payer: Self-pay | Admitting: Family Medicine

## 2024-04-19 ENCOUNTER — Other Ambulatory Visit: Payer: Self-pay | Admitting: Family Medicine

## 2024-04-19 DIAGNOSIS — E559 Vitamin D deficiency, unspecified: Secondary | ICD-10-CM

## 2024-05-11 ENCOUNTER — Other Ambulatory Visit: Payer: Self-pay | Admitting: Cardiology

## 2024-05-11 DIAGNOSIS — I480 Paroxysmal atrial fibrillation: Secondary | ICD-10-CM

## 2024-05-11 NOTE — Telephone Encounter (Signed)
 Prescription refill request for Eliquis  received. Indication:PAF Last office visit: 03/14/24  GORMAN Sierras MD Scr: 1.27 on 02/02/24  Epic Age: 89 Weight: 85.7kg  Based on above findings Eliquis  5mg  twice daily is the appropriate dose.  Refill approved.

## 2024-05-11 NOTE — Telephone Encounter (Signed)
 Prescription refill request for Eliquis  received. Indication: PAF Last office visit: Scr: Age:  Weight:

## 2024-06-13 ENCOUNTER — Encounter: Payer: Self-pay | Admitting: Family Medicine

## 2024-06-13 ENCOUNTER — Ambulatory Visit (INDEPENDENT_AMBULATORY_CARE_PROVIDER_SITE_OTHER): Admitting: Family Medicine

## 2024-06-13 VITALS — BP 156/63 | HR 64 | Temp 98.2°F | Ht 67.0 in

## 2024-06-13 DIAGNOSIS — I10 Essential (primary) hypertension: Secondary | ICD-10-CM

## 2024-06-13 DIAGNOSIS — L03115 Cellulitis of right lower limb: Secondary | ICD-10-CM | POA: Diagnosis not present

## 2024-06-13 DIAGNOSIS — L03116 Cellulitis of left lower limb: Secondary | ICD-10-CM

## 2024-06-13 DIAGNOSIS — Z7409 Other reduced mobility: Secondary | ICD-10-CM | POA: Insufficient documentation

## 2024-06-13 DIAGNOSIS — I890 Lymphedema, not elsewhere classified: Secondary | ICD-10-CM | POA: Diagnosis not present

## 2024-06-13 MED ORDER — TORSEMIDE 20 MG PO TABS: 20.0000 mg | ORAL_TABLET | Freq: Every day | ORAL | 0 refills | Status: DC

## 2024-06-13 MED ORDER — CLINDAMYCIN HCL 150 MG PO CAPS: 450.0000 mg | ORAL_CAPSULE | Freq: Three times a day (TID) | ORAL | 0 refills | Status: AC

## 2024-06-13 NOTE — Progress Notes (Signed)
   Acute Office Visit  Subjective:     Patient ID: Courtney Berry, female    DOB: 1933-01-16, 89 y.o.   MRN: [id number]  Chief Complaint  Patient presents with   Cellulitis    HPI Patient is in today for possible cellulitis. She has a history of lymphedema in both lower leg. She has had increased erythema to bilateral legs for the last few weeks with increased tenderness and swelling. Symptoms have been unchanged. NO exudate, fever, or chills. She hasn't been able to use her compression pumps as much as she typically would because of the pain. She has not had any fluid pills at home to take. Was hospitalized last October for sepsis from cellulitis.   Also needs a handicap form. She requires a assistive device for ambulation. Primarily uses a wheelchair but does use a walker sometimes.   ROS As per HPI.      Objective:    BP (!) 156/63   Pulse 64   Temp 98.2 F (36.8 C) (Temporal)   Ht 5' 7 (1.702 m)   SpO2 93%   BMI 29.60 kg/m    Physical Exam Vitals and nursing note reviewed.  Constitutional:      General: She is not in acute distress.    Appearance: She is obese. She is not ill-appearing, toxic-appearing or diaphoretic.  Pulmonary:     Effort: Pulmonary effort is normal. No respiratory distress.  Musculoskeletal:     Right lower leg: Edema (with warmth and erythema. No exudate) present.     Left lower leg: Edema (with warmth and erythema. No exudate) present.  Skin:    General: Skin is warm and dry.  Neurological:     Mental Status: She is alert and oriented to person, place, and time. Mental status is at baseline.     Gait: Gait abnormal (arrives in wheelchair).  Psychiatric:        Mood and Affect: Mood normal.        Behavior: Behavior normal.     No results found for any visits on 06/13/24.      Assessment & Plan:   Courtney Berry was seen today for cellulitis.  Diagnoses and all orders for this visit:  Bilateral cellulitis of lower leg Lymphedema of  both lower extremities Clindamycin  as below given drug allergies. Elevation. Take fluid pill for the next few days. Use compression pumps as able. Return to office for new or worsening symptoms, or if symptoms persist.  -     clindamycin  (CLEOCIN ) 150 MG capsule; Take 3 capsules (450 mg total) by mouth 3 (three) times daily for 7 days. -     torsemide  (DEMADEX ) 20 MG tablet; Take 1 tablet (20 mg total) by mouth daily. For Fluid  Impaired mobility Handicap form completed.   Primary hypertension BP is a little elevated today. Monitor at home and notify for elevated readings.    Return if symptoms worsen or fail to improve.  The patient indicates understanding of these issues and agrees with the plan.  Courtney CHRISTELLA Search, FNP

## 2024-06-18 ENCOUNTER — Other Ambulatory Visit: Payer: Self-pay

## 2024-06-18 ENCOUNTER — Encounter (HOSPITAL_COMMUNITY): Payer: Self-pay

## 2024-06-18 ENCOUNTER — Emergency Department (HOSPITAL_COMMUNITY)
Admission: EM | Admit: 2024-06-18 | Discharge: 2024-06-18 | Disposition: A | Discharge status: Home / Self Care | Attending: Emergency Medicine | Admitting: Emergency Medicine

## 2024-06-18 ENCOUNTER — Emergency Department (HOSPITAL_COMMUNITY)

## 2024-06-18 DIAGNOSIS — I10 Essential (primary) hypertension: Secondary | ICD-10-CM | POA: Diagnosis not present

## 2024-06-18 DIAGNOSIS — I4891 Unspecified atrial fibrillation: Secondary | ICD-10-CM | POA: Diagnosis not present

## 2024-06-18 DIAGNOSIS — R600 Localized edema: Secondary | ICD-10-CM | POA: Insufficient documentation

## 2024-06-18 DIAGNOSIS — S0990XA Unspecified injury of head, initial encounter: Secondary | ICD-10-CM | POA: Diagnosis not present

## 2024-06-18 DIAGNOSIS — M47816 Spondylosis without myelopathy or radiculopathy, lumbar region: Secondary | ICD-10-CM | POA: Diagnosis not present

## 2024-06-18 DIAGNOSIS — S300XXA Contusion of lower back and pelvis, initial encounter: Secondary | ICD-10-CM | POA: Diagnosis not present

## 2024-06-18 DIAGNOSIS — S3992XA Unspecified injury of lower back, initial encounter: Secondary | ICD-10-CM | POA: Diagnosis present

## 2024-06-18 DIAGNOSIS — W19XXXA Unspecified fall, initial encounter: Secondary | ICD-10-CM | POA: Insufficient documentation

## 2024-06-18 DIAGNOSIS — R404 Transient alteration of awareness: Secondary | ICD-10-CM | POA: Diagnosis not present

## 2024-06-18 DIAGNOSIS — Z7901 Long term (current) use of anticoagulants: Secondary | ICD-10-CM | POA: Insufficient documentation

## 2024-06-18 DIAGNOSIS — I6782 Cerebral ischemia: Secondary | ICD-10-CM | POA: Diagnosis not present

## 2024-06-18 DIAGNOSIS — S79911A Unspecified injury of right hip, initial encounter: Secondary | ICD-10-CM | POA: Diagnosis not present

## 2024-06-18 LAB — CBC
HCT: 34.7 % — ABNORMAL LOW (ref 36.0–46.0)
Hemoglobin: 10.7 g/dL — ABNORMAL LOW (ref 12.0–15.0)
MCH: 33.3 pg (ref 26.0–34.0)
MCHC: 30.8 g/dL (ref 30.0–36.0)
MCV: 108.1 fL — ABNORMAL HIGH (ref 80.0–100.0)
Platelets: 167 K/uL (ref 150–400)
RBC: 3.21 MIL/uL — ABNORMAL LOW (ref 3.87–5.11)
RDW: 12 % (ref 11.5–15.5)
WBC: 9.6 K/uL (ref 4.0–10.5)
nRBC: 0 % (ref 0.0–0.2)

## 2024-06-18 LAB — COMPREHENSIVE METABOLIC PANEL WITH GFR
ALT: 10 U/L (ref 0–44)
AST: 17 U/L (ref 15–41)
Albumin: 2.7 g/dL — ABNORMAL LOW (ref 3.5–5.0)
Alkaline Phosphatase: 90 U/L (ref 38–126)
Anion gap: 13 (ref 5–15)
BUN: 25 mg/dL — ABNORMAL HIGH (ref 8–23)
CO2: 28 mmol/L (ref 22–32)
Calcium: 8.6 mg/dL — ABNORMAL LOW (ref 8.9–10.3)
Chloride: 101 mmol/L (ref 98–111)
Creatinine, Ser: 1.73 mg/dL — ABNORMAL HIGH (ref 0.44–1.00)
GFR, Estimated: 28 mL/min — ABNORMAL LOW (ref >60)
Glucose, Bld: 168 mg/dL — ABNORMAL HIGH (ref 70–99)
Potassium: 4.1 mmol/L (ref 3.5–5.1)
Sodium: 142 mmol/L (ref 135–145)
Total Bilirubin: 0.7 mg/dL (ref 0.0–1.2)
Total Protein: 6.3 g/dL — ABNORMAL LOW (ref 6.5–8.1)

## 2024-06-18 MED ORDER — OXYCODONE-ACETAMINOPHEN 5-325 MG PO TABS
1.0000 | ORAL_TABLET | Freq: Three times a day (TID) | ORAL | 0 refills | Status: DC | PRN
Start: 2024-06-18 — End: 2024-08-11

## 2024-06-18 MED ORDER — OXYCODONE-ACETAMINOPHEN 5-325 MG PO TABS
1.0000 | ORAL_TABLET | Freq: Once | ORAL | Status: AC
  Administered 2024-06-18: 1 via ORAL
  Filled 2024-06-18: qty 1

## 2024-06-18 NOTE — ED Triage Notes (Signed)
 pt brb [facility] c/o Fall; Husband witnessed Fall, pt denies LOC; hx of Falls; hx of Afib; pt denies pain; pt c/o of dizziness before fall, pt states recent rx of water pills. Having some dizziness ever since started rx. pt c/o headache, pain 8/10, denies N/V/D; last BM today; A&Ox4.

## 2024-06-18 NOTE — ED Provider Notes (Signed)
 [place] EMERGENCY DEPARTMENT AT [facility] Provider Note   CSN: [id number] Arrival date & time: 06/18/24  1557     Patient presents with: Courtney Berry is a 89 y.o. female.    Fall   This patient is an 89 year old female on Eliquis  and amiodarone  for atrial fibrillation, she has chronic lymphedema and is recently put on torsemide  because of increasing peripheral edema as well as clindamycin  because of what appeared to be some cellulitis.  She presents today after having an accidental fall while she was trying to walk from 1 room to another going down 2 steps where she landed on the ground bumped the left side of her head, she has a hematoma to the right buttock, she has some difficulty ambulating because of this.  She arrives by [facility] after the fall, she states that she does have a cane and a walker and a wheelchair but was using none of them.  She comes in because she was having some dizziness or swimmy headedness after the fall.    Prior to Admission medications   Medication Sig Start Date End Date Taking? Authorizing Provider  oxyCODONE -acetaminophen  (PERCOCET/ROXICET) 5-325 MG tablet Take 1 tablet by mouth every 8 (eight) hours as needed for severe pain (pain score 7-10). 06/18/24  Yes Cleotilde Rogue, MD  acetaminophen  (TYLENOL ) 500 MG tablet Take 1,000 mg by mouth every 6 (six) hours as needed for moderate pain.    [provider]  albuterol  (VENTOLIN  HFA) 108 (90 Base) MCG/ACT inhaler Inhale 2 puffs into the lungs every 6 (six) hours as needed for wheezing or shortness of breath. 02/02/24   Cathlene Marry Lenis, FNP  amiodarone  (PACERONE ) 200 MG tablet TAKE 1/2 TABLET BY MOUTH DAILY 05/11/24   Debera Jayson MATSU, MD  amLODipine  (NORVASC ) 2.5 MG tablet TAKE 1 TABLET BY MOUTH EVERY DAY 05/11/24   Debera Jayson MATSU, MD  clindamycin  (CLEOCIN ) 150 MG capsule Take 3 capsules (450 mg total) by mouth 3 (three) times daily for 7 days. 06/13/24  06/20/24  Joesph Annabella HERO, FNP  cyanocobalamin  1000 MCG tablet Take 1 tablet (1,000 mcg total) by mouth daily. 09/07/23   Pearlean Manus, MD  ELIQUIS  5 MG TABS tablet TAKE 1 TABLET BY MOUTH TWICE A DAY 05/11/24   Debera Jayson MATSU, MD  ferrous sulfate  325 (65 FE) MG tablet TAKE 1 TABLET BY MOUTH EVERY DAY WITH BREAKFAST 04/04/24   Cathlene Marry Lenis, FNP  metoprolol  tartrate (LOPRESSOR ) 25 MG tablet TAKE 1/2 TABLET TWICE A DAY BY MOUTH 12/30/23   Debera Jayson MATSU, MD  olmesartan  (BENICAR ) 20 MG tablet Take 1 tablet (20 mg total) by mouth daily. 08/06/23   Darlean Ozell NOVAK, MD  Probiotic Product (PROBIOTIC DAILY PO) Take 1 tablet by mouth daily.    [provider]  torsemide  (DEMADEX ) 20 MG tablet Take 1 tablet (20 mg total) by mouth daily. For Fluid 06/13/24   Joesph Annabella HERO, FNP    Allergies: Ace inhibitors, Clopidogrel bisulfate, Darvocet [propoxyphene n-acetaminophen ], Doxycycline , Lasix  [furosemide ], Nitrofurantoin monohyd macro, Plavix [clopidogrel bisulfate], Sulfa antibiotics, Amitriptyline , Gabapentin , Omnicef  [cefdinir ], and Penicillins    Review of Systems  All other systems reviewed and are negative.   Updated Vital Signs BP 123/60 (BP Location: Left Arm)   Pulse 63   Temp 98.2 F (36.8 C) (Oral)   Resp (!) 24   Ht 1.702 m (5' 7)   Wt 87.1 kg   SpO2 95%   BMI 30.07  kg/m   Physical Exam Vitals and nursing note reviewed.  Constitutional:      General: She is not in acute distress.    Appearance: She is well-developed.  HENT:     Head: Normocephalic and atraumatic.     Comments: No physical signs of head injury including tenderness over the head or scalp    Mouth/Throat:     Pharynx: No oropharyngeal exudate.  Eyes:     General: No scleral icterus.       Right eye: No discharge.        Left eye: No discharge.     Conjunctiva/sclera: Conjunctivae normal.     Pupils: Pupils are equal, round, and reactive to light.  Neck:     Thyroid : No thyromegaly.      Vascular: No JVD.  Cardiovascular:     Rate and Rhythm: Normal rate and regular rhythm.     Heart sounds: Normal heart sounds. No murmur heard.    No friction rub. No gallop.  Pulmonary:     Effort: Pulmonary effort is normal. No respiratory distress.     Breath sounds: Normal breath sounds. No wheezing or rales.  Abdominal:     General: Bowel sounds are normal. There is no distension.     Palpations: Abdomen is soft. There is no mass.     Tenderness: There is no abdominal tenderness.  Musculoskeletal:        General: Normal range of motion.     Cervical back: Normal range of motion and neck supple.     Right lower leg: Edema present.     Left lower leg: Edema present.     Comments: Bilateral lower extremities with full range of motion, limited only by a hematoma to the right buttock which I have palpated, there is no tenderness with passive range of motion internal and external rotation of the hip on the right or flexion and extension of the knee or the ankle on the right.  She has bilateral edema which is improved from prior but has erythema to the bilateral shins.  Upper extremities without any decreased range of motion, soft compartments diffusely  Lymphadenopathy:     Cervical: No cervical adenopathy.  Skin:    General: Skin is warm and dry.     Findings: No erythema or rash.  Neurological:     Mental Status: She is alert.     Coordination: Coordination normal.     Comments: Awake and alert and following my commands without difficulty.  She answers my questions perfectly and has normal strength in all 4 extremities, cranial nerves III through XII are normal  Psychiatric:        Behavior: Behavior normal.     (all labs ordered are listed, but only abnormal results are displayed) Labs Reviewed  CBC - Abnormal; Notable for the following components:      Result Value   RBC 3.21 (*)    Hemoglobin 10.7 (*)    HCT 34.7 (*)    MCV 108.1 (*)    All other components within  normal limits  COMPREHENSIVE METABOLIC PANEL WITH GFR - Abnormal; Notable for the following components:   Glucose, Bld 168 (*)    BUN 25 (*)    Creatinine, Ser 1.73 (*)    Calcium 8.6 (*)    Total Protein 6.3 (*)    Albumin 2.7 (*)    GFR, Estimated 28 (*)    All other components within normal limits  EKG: EKG Interpretation Date/Time:  Saturday June 18 2024 16:47:37 EDT Ventricular Rate:  60 PR Interval:  187 QRS Duration:  96 QT Interval:  461 QTC Calculation: 461 R Axis:   4  Text Interpretation: Normal sinus rhythm Atrial premature complex Abnormal R-wave progression, early transition Nonspecific repol abnormality, lateral leads Confirmed by Cleotilde Rogue ([id number]) on 06/18/2024 5:01:42 PM  Radiology: [facility] Hip Unilat W or Wo Pelvis 2-3 Views Right Result Date: 06/18/2024 EXAM: 2 or 3 VIEW(S) XRAY OF THE RIGHT HIP 06/18/2024 05:26:09 PM COMPARISON: CT 10/26/2019 CLINICAL HISTORY: Trauma. Per Triage: pt brb [facility] c/o Fall; Husband witnessed Fall, pt denies LOC; hx of Falls; hx of Afib; pt denies pain; pt c/o of dizziness before fall, pt states recent rx of water pills. Having some dizziness ever since started rx. pt c/o; headache, pain 8/10, denies N/V/D; last BM today; A\T\Ox4. FINDINGS: BONES AND JOINTS: No acute fracture or focal osseous lesion. The hip joint is maintained. No significant degenerative changes. Spondylotic changes in the visualized lower lumbar spine. SOFT TISSUES: The soft tissues are unremarkable. IMPRESSION: 1. No acute fracture or dislocation of the right hip or visualized pelvis. Electronically signed by: Katheleen Faes MD 06/18/2024 05:29 PM EDT RP Workstation: [id number]   CT Head Wo Contrast Result Date: 06/18/2024 CLINICAL DATA:  Witnessed fall EXAM: CT HEAD WITHOUT CONTRAST TECHNIQUE: Contiguous axial images were obtained from the base of the skull through the vertex without intravenous contrast. RADIATION DOSE REDUCTION: This exam was performed  according to the departmental dose-optimization program which includes automated exposure control, adjustment of the mA and/or kV according to patient size and/or use of iterative reconstruction technique. COMPARISON:  CT brain 10/18/2019 FINDINGS: Brain: No acute territorial infarction, hemorrhage or intracranial mass. Moderate chronic small vessel ischemic changes of the white matter. Small chronic infarcts within the right basal ganglia. Ventricles are nonenlarged. Vascular: No hyperdense vessels.  Carotid vascular calcification. Skull: Normal. Negative for fracture or focal lesion. Sinuses/Orbits: No acute finding. Other: None IMPRESSION: 1. No CT evidence for acute intracranial abnormality. 2. Atrophy and chronic small vessel ischemic changes of the white matter. Small chronic infarcts within the right basal ganglia. Electronically Signed   By: Luke Bun M.D.   On: 06/18/2024 17:27     Procedures   Medications Ordered in the ED  oxyCODONE -acetaminophen  (PERCOCET/ROXICET) 5-325 MG per tablet 1 tablet (has no administration in time range)    Clinical Course as of 06/18/24 1751  Sat Jun 18, 2024  1739 The patient's creatinine is slightly elevated compared to prior values, I suspect this is because of the recent torsemide  use.  There is no leukocytosis to suggest worsening infection and she states her legs are feeling better, the x-rays do not reveal any signs of intracranial abnormalities facial fractures or hip fractures or pelvic fractures.  Patient and family updated [BM]    Clinical Course User Index [BM] Cleotilde Rogue, MD                                 Medical Decision Making Amount and/or Complexity of Data Reviewed Labs: ordered. Radiology: ordered. ECG/medicine tests: ordered.  Risk Prescription drug management.    This patient presents to the ED for concern of trauma fall and syncope are possibilities although I feel like the patient likely had a mechanical fall, this  involves an extensive number of treatment options, and is a complaint that carries with it  a high risk of complications and morbidity.  The differential diagnosis includes trauma, hematoma, fracture, unlikely to be stroke or coronary, rhythm appears normal on the monitor   Co morbidities / Chronic conditions that complicate the patient evaluation  Chronic A-fib on Eliquis    Additional history obtained:  Additional history obtained from EMR External records from outside source obtained and reviewed including medical record, this includes recent outpatient visit to her family doctor where she was placed on clindamycin  and torsemide    Lab Tests:  I Ordered, and personally interpreted labs.  The pertinent results include: Renal insufficiency, patient is now finished torsemide    Imaging Studies ordered:  I ordered imaging studies including x-ray of the hip as well as the pelvis and the brain, no signs of fractures I independently visualized and interpreted imaging which showed no fractures, no bleeding, no hemorrhage I agree with the radiologist interpretation   Cardiac Monitoring: / EKG:  The patient was maintained on a cardiac monitor.  I personally viewed and interpreted the cardiac monitored which showed an underlying rhythm of: Normal sinus rhythm   Problem List / ED Course / Critical interventions / Medication management  Patient appears well, she has no acute findings, she was prescribed Percocet for home I ordered medication including Percocet Reevaluation of the patient after these medicines showed that the patient improvement I have reviewed the patients home medicines and have made adjustments as needed   Social Determinants of Health:  None   Test / Admission - Considered:  Patient explained all of her results, she is agreeable, no indication for admission or specialty consultation, she is willing to follow-up outpatient.  I sent a message to her primary provider  for close follow-up.  I have discussed with the patient at the bedside the results, and the meaning of these results.  They have had opportunity to ask questions,  expressed their understanding to the need for follow-up with primary care physician      Final diagnoses:  Hematoma of right buttock  Minor head injury, initial encounter    ED Discharge Orders          Ordered    oxyCODONE -acetaminophen  (PERCOCET/ROXICET) 5-325 MG tablet  Every 8 hours PRN        06/18/24 1747               Cleotilde Rogue, MD 06/18/24 1751

## 2024-06-18 NOTE — Discharge Instructions (Signed)
 Ice packs for the swelling of the buttock Will probably take a couple of weeks or even a month to get better and you may see some swelling of the buttock and the leg, if you develop severe or worsening swelling in those areas you will need to follow-up with your doctor or the ER.  Opiate medications such as Percocet or Vicodin or morphine are very strong pain medications that are also very addictive if they are taken for too long, even a single dose can predispose someone to become addicted so be very careful when taking this medication.  You should take the smallest amount possible to relieve your pain, these medications may cause constipation or nausea, please follow-up with your doctor if you are having the need for ongoing pain control despite these medications.  Additionally please be aware that these medications may cause sedation or sleepiness or alter judgment so you should not take this if you are driving a vehicle or operating heavy machinery or taking care of small children.  I have prescribed a medication called oxycodone  with Tylenol , you can take 1 tablet every 8 hours only for severe pain.  Be aware that this is a narcotic medication and can cause sleepiness, dizziness and difficulty ambulating, it can also cause constipation and nausea.  Thank you for allowing us  to treat you in the emergency department today.  After reviewing your examination and potential testing that was done it appears that you are safe to go home.  I would like for you to follow-up with your doctor within the next several days, have them obtain your records and follow-up with them to review all potential tests and results from your visit.  If you should develop severe or worsening symptoms return to the emergency department immediately

## 2024-06-22 ENCOUNTER — Telehealth: Payer: Self-pay | Admitting: Family Medicine

## 2024-06-22 NOTE — Telephone Encounter (Signed)
 Lmtcb to schedule an apt with Annabella Search

## 2024-06-22 NOTE — Telephone Encounter (Signed)
-----   Message from RMA [place] R sent at 06/21/2024  4:27 PM EDT ----- Regarding: FW: ER visit fu Needs ER follow up ----- Message ----- From: Joesph Annabella HERO, FNP Sent: 06/20/2024   7:59 AM EDT To: [facility] Subject: FW: ER visit fu                                Can we get patient scheduled for ER follow up? ----- Message ----- From: Cleotilde Rogue, MD Sent: 06/18/2024   5:49 PM EDT To: Annabella HERO Joesph, FNP Subject: ER visit fu                                    Hi Tiffany, I saw Courtney Berry today in the emergency department, she had had an accidental fall and landed on her right buttock and has quite a large hematoma.  She is anticoagulated on Eliquis  so wanted to make sure that she had good follow-up, her laboratory data also showed that she had some slight creatinine elevation after being placed on torsemide  for her lymphedema.  She has finished the torsemide  so I have asked her to stop taking it and to eat and drink as per normal.  Her legs look better with regards to the redness, she can continue the clindamycin .  Her x-ray showed no fractures of the hip the pelvis or the head.  Thanks for following up this week, can you have the office reach out to make an appointment for her to be seen within the next 7 days, thanks Rogue Cleotilde

## 2024-06-29 ENCOUNTER — Ambulatory Visit: Admitting: Family Medicine

## 2024-07-05 ENCOUNTER — Other Ambulatory Visit: Payer: Self-pay | Admitting: *Deleted

## 2024-07-05 ENCOUNTER — Other Ambulatory Visit: Payer: Self-pay | Admitting: Family Medicine

## 2024-07-05 DIAGNOSIS — J449 Chronic obstructive pulmonary disease, unspecified: Secondary | ICD-10-CM

## 2024-07-05 DIAGNOSIS — L03115 Cellulitis of right lower limb: Secondary | ICD-10-CM

## 2024-07-05 MED ORDER — ALBUTEROL SULFATE HFA 108 (90 BASE) MCG/ACT IN AERS: 2.0000 | INHALATION_SPRAY | Freq: Four times a day (QID) | RESPIRATORY_TRACT | 0 refills | Status: DC | PRN

## 2024-07-07 ENCOUNTER — Ambulatory Visit: Admitting: Family Medicine

## 2024-07-07 ENCOUNTER — Encounter: Payer: Self-pay | Admitting: Family Medicine

## 2024-07-07 VITALS — BP 136/55 | HR 97 | Temp 98.0°F | Ht 67.0 in

## 2024-07-07 DIAGNOSIS — M05712 Rheumatoid arthritis with rheumatoid factor of left shoulder without organ or systems involvement: Secondary | ICD-10-CM | POA: Diagnosis not present

## 2024-07-07 DIAGNOSIS — S300XXA Contusion of lower back and pelvis, initial encounter: Secondary | ICD-10-CM

## 2024-07-07 DIAGNOSIS — N1832 Chronic kidney disease, stage 3b: Secondary | ICD-10-CM

## 2024-07-07 DIAGNOSIS — I482 Chronic atrial fibrillation, unspecified: Secondary | ICD-10-CM

## 2024-07-07 DIAGNOSIS — I890 Lymphedema, not elsewhere classified: Secondary | ICD-10-CM | POA: Diagnosis not present

## 2024-07-07 DIAGNOSIS — Z7901 Long term (current) use of anticoagulants: Secondary | ICD-10-CM

## 2024-07-07 DIAGNOSIS — M05711 Rheumatoid arthritis with rheumatoid factor of right shoulder without organ or systems involvement: Secondary | ICD-10-CM

## 2024-07-07 DIAGNOSIS — J441 Chronic obstructive pulmonary disease with (acute) exacerbation: Secondary | ICD-10-CM

## 2024-07-07 MED ORDER — PREDNISONE 20 MG PO TABS
40.0000 mg | ORAL_TABLET | Freq: Every day | ORAL | 0 refills | Status: AC
Start: 2024-07-07 — End: 2024-07-12

## 2024-07-07 MED ORDER — IPRATROPIUM-ALBUTEROL 0.5-2.5 (3) MG/3ML IN SOLN: 3.0000 mL | Freq: Four times a day (QID) | RESPIRATORY_TRACT | 3 refills | Status: DC | PRN

## 2024-07-07 NOTE — Progress Notes (Signed)
 Established Patient Office Visit  Subjective   Patient ID: Courtney Berry, female    DOB: [birth year]  Age: 89 y.o. MRN: [id number]  Chief Complaint  Patient presents with   Medical Management of Chronic Issues    HPI  History of Present Illness   Courtney Berry is an 89 year old female who presents for an ER follow up for a hematoma of the right buttocks. Seen at [facility] on 06/18/24.  Post-fall pain and hematoma - Fall occurred on August 2nd after standing up quickly - Hit left side of her head and right buttock - Head scan showed no intracranial bleeding - Developed large hematoma on right buttock extending down right posterior thigh - Hip and pelvis x-rays revealed no fractures - Discharged with pain medication - Hematoma has been improving. Decreasing in size. Pain is improving - No bleeding -Kidney function had decreased on labs in ER - Hemoglobin was around 10 - On anticoagulation for A. Fib  Dry cough  - Dry cough for approximately one-two weeks - Cough worsens at night and is aggravated by talking - No sore throat, fever, or shortness of breath - Runny nose present - History of COPD - Using albuterol  inhaler with some relief - She is out of nebulizer solution - Recent courses of antibiotics including clindamycin  and cefalexin for cellulitis  Musculoskeletal pain and lymphedema - Hx of RA in shoulders. Used to see rheumatology but hasn't in year - Pain primarily in legs and shoulders - No swelling in hands or wrists - Shoulder discomfort attributed to sleeping in a recliner - Significant leg swelling due to lymphedema, present for approximately eight years        ROS    Objective:     BP (!) 136/55   Pulse 97   Temp 98 F (36.7 C) (Temporal)   Ht 5' 7 (1.702 m)   SpO2 96%   BMI 30.07 kg/m    Physical Exam Vitals and nursing note reviewed.  Constitutional:      General: She is not in acute distress.    Appearance: She is not ill-appearing,  toxic-appearing or diaphoretic.  Cardiovascular:     Rate and Rhythm: Normal rate. Rhythm irregularly irregular.     Heart sounds: Normal heart sounds. No murmur heard. Pulmonary:     Effort: Pulmonary effort is normal.  Musculoskeletal:     Right lower leg: 2+ Pitting Edema present.     Left lower leg: 2+ Pitting Edema present.  Skin:    General: Skin is warm and dry.     Comments: Large hematoma to right buttocks extending to upper posterior thigh. Smaller than previous.   Neurological:     Mental Status: She is alert and oriented to person, place, and time. Mental status is at baseline.     Gait: Gait abnormal (arrives in wheelchair).  Psychiatric:        Mood and Affect: Mood normal.        Behavior: Behavior normal.      No results found for any visits on 07/07/24.    The ASCVD Risk score (Arnett DK, et al., 2019) failed to calculate for the following reasons:   The 2019 ASCVD risk score is only valid for ages 76 to 82    Assessment & Plan:   Hematoma of right buttock Hematoma size and pain reduced, no fractures or intracranial bleeding on imaging. - Continue ice or heat packs for comfort.  Lymphedema of both lower  extremities Elevation, SCD pumps.   Stage 3b chronic kidney disease (HCC) Decreased kidney function under nephrologist care. - Repeat kidney function tests. -     CBC with Differential/Platelet -     BMP8+EGFR  COPD with acute exacerbation (HCC) - Prescribed prednisone  for inflammation. - Provided DuoNeb for nebulizer use. - Withheld antibiotics due to recent use. -     predniSONE ; Take 2 tablets (40 mg total) by mouth daily with breakfast for 5 days.  Dispense: 10 tablet; Refill: 0 -     Ipratropium-Albuterol ; Take 3 mLs by nebulization every 6 (six) hours as needed.  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 Lacks recent  rheumatology follow-up due to provider closure.  Chronic anticoagulation Atrial fibrillation, chronic (HCC) Continue eliquis .   Keep scheduled appt.  Sooner for new or worsening symptoms.   The patient indicates understanding of these issues and agrees with the plan.   Courtney Berry Search, FNP

## 2024-07-07 NOTE — Patient Instructions (Signed)
Hematoma A hematoma is a collection of blood under the skin, in an organ, in a body space, in a joint space, or in other tissue. The blood can thicken (clot) to form a lump that you can see and feel. The lump is often firm and may become sore and tender. Most hematomas get better in a few days to weeks. However, some hematomas may be serious and require medical care. Hematomas can range from very small to very large. What are the causes? This condition is caused by: A blunt or penetrating injury. Leakage from a blood vessel under the skin. Some medical procedures, including surgeries, such as oral surgery, face lifts, and surgeries on the joints. Some medical conditions that cause bleeding or bruising. There may be multiple hematomas that appear in different areas of the body. What increases the risk? You are more likely to develop this condition if: You are an older adult. You use blood thinners. You regularly use NSAIDs, such as ibuprofen, for pain. You play contact sports. What are the signs or symptoms?  Symptoms of this condition depend on where the hematoma is located.  Common symptoms of a hematoma that is under the skin include: A firm lump on the body. Pain and tenderness in the area. Bruising. Blue, dark blue, purple-red, or yellowish skin (discoloration) may appear at the site of the hematoma if the hematoma is close to the surface of the skin. Common symptoms of a hematoma that is deep in the tissues or body spaces may be less obvious. They include: A collection of blood in the stomach (intra-abdominal hematoma). This may cause pain in the abdomen, weakness, fainting, and shortness of breath. A collection of blood in the head (intracranial hematoma). This may cause a headache or symptoms such as weakness, trouble speaking or understanding, or a change in consciousness. How is this diagnosed? This condition is diagnosed based on: Your medical history. A physical exam. Imaging  tests, such as an ultrasound or CT scan. These may be needed if your health care provider suspects a hematoma in deeper tissues or body spaces. Blood tests. These may be needed if your health care provider believes that the hematoma is caused by a medical condition. How is this treated? Treatment for this condition depends on the cause, size, and location of the hematoma. Treatment may include: Doing nothing. The majority of hematomas do not need treatment as many of them go away on their own. Surgery or close monitoring. This may be needed for large hematomas or hematomas that affect vital organs. Medicines. Medicines may be given if there is an underlying medical cause for the hematoma. Follow these instructions at home: Managing pain, stiffness, and swelling  If directed, put ice on the injured area. To do this: Put ice in a plastic bag. Place a towel between your skin and the bag. Leave the ice on for 20 minutes, 2-3 times a day for the first couple of days. If your skin turns bright red, remove the ice right away to prevent skin damage. The risk of skin damage is higher if you cannot feel pain, heat, or cold. If directed, apply heat to the affected area as often as told by your health care provider. Use the heat source that your health care provider recommends, such as a moist heat pack or a heating pad. Place a towel between your skin and the heat source. Leave the heat on for 20-30 minutes. If your skin turns bright red, remove the heat right  away to prevent burns. The risk of burns is higher if you cannot feel pain, heat, or cold. Raise (elevate) the injured area above the level of your heart while you are sitting or lying down. If directed, wrap the affected area with an elastic bandage. The bandage applies pressure (compression) to the area, which may help to reduce swelling and promote healing. Do not wrap the bandage too tightly around the affected area. If your hematoma is on a leg  or foot (lower extremity) and is painful, your health care provider may recommend crutches. Use them as told by your health care provider. General instructions Take over-the-counter and prescription medicines only as told by your health care provider. Rest the injured area as directed by your health care provider. Keep all follow-up visits. Your health care provider may want to see how your hematoma is progressing with treatment. Contact a health care provider if: You have a fever. The swelling or discoloration gets worse. You develop more hematomas. Your pain is worse or your pain is not controlled with medicine. Your skin over the hematoma breaks or starts bleeding. Get help right away if: Your hematoma is in your chest or abdomen and you have weakness, shortness of breath, or a change in consciousness. You have a hematoma on your scalp that is caused by a fall or injury, and you also have: A headache that gets worse. Trouble speaking or understanding speech. Weakness. A change in alertness or consciousness. These symptoms may be an emergency. Get help right away. Call 911. Do not wait to see if the symptoms will go away. Do not drive yourself to the hospital. This information is not intended to replace advice given to you by your health care provider. Make sure you discuss any questions you have with your health care provider. Document Revised: 04/28/2022 Document Reviewed: 04/28/2022 Elsevier Patient Education  2024 [employer].

## 2024-07-08 ENCOUNTER — Ambulatory Visit: Payer: Self-pay | Admitting: Family Medicine

## 2024-07-08 DIAGNOSIS — E875 Hyperkalemia: Secondary | ICD-10-CM

## 2024-07-08 LAB — BMP8+EGFR
BUN/Creatinine Ratio: 14 (ref 12–28)
BUN: 18 mg/dL (ref 8–27)
CO2: 25 mmol/L (ref 20–29)
Calcium: 9 mg/dL (ref 8.7–10.3)
Chloride: 102 mmol/L (ref 96–106)
Creatinine, Ser: 1.31 mg/dL — AB (ref 0.57–1.00)
Glucose: 85 mg/dL (ref 70–99)
Potassium: 5.3 mmol/L — AB (ref 3.5–5.2)
Sodium: 139 mmol/L (ref 134–144)
eGFR: 39 mL/min/1.73 — AB (ref >59)

## 2024-07-08 LAB — CBC WITH DIFFERENTIAL/PLATELET
Basophils Absolute: 0.1 x10E3/uL (ref 0.0–0.2)
Basos: 1 %
EOS (ABSOLUTE): 0.5 x10E3/uL — ABNORMAL HIGH (ref 0.0–0.4)
Eos: 7 %
Hematocrit: 31.3 % — ABNORMAL LOW (ref 34.0–46.6)
Hemoglobin: 10.1 g/dL — ABNORMAL LOW (ref 11.1–15.9)
Immature Grans (Abs): 0 x10E3/uL (ref 0.0–0.1)
Immature Granulocytes: 0 %
Lymphocytes Absolute: 2 x10E3/uL (ref 0.7–3.1)
Lymphs: 26 %
MCH: 33.4 pg — ABNORMAL HIGH (ref 26.6–33.0)
MCHC: 32.3 g/dL (ref 31.5–35.7)
MCV: 104 fL — ABNORMAL HIGH (ref 79–97)
Monocytes Absolute: 0.8 x10E3/uL (ref 0.1–0.9)
Monocytes: 10 %
Neutrophils Absolute: 4.3 x10E3/uL (ref 1.4–7.0)
Neutrophils: 56 %
Platelets: 208 x10E3/uL (ref 150–450)
RBC: 3.02 x10E6/uL — ABNORMAL LOW (ref 3.77–5.28)
RDW: 11.1 % — ABNORMAL LOW (ref 11.7–15.4)
WBC: 7.7 x10E3/uL (ref 3.4–10.8)

## 2024-07-13 ENCOUNTER — Other Ambulatory Visit: Payer: Self-pay | Admitting: Internal Medicine

## 2024-07-14 ENCOUNTER — Encounter: Payer: Self-pay | Admitting: Family Medicine

## 2024-07-14 ENCOUNTER — Other Ambulatory Visit

## 2024-07-14 DIAGNOSIS — E875 Hyperkalemia: Secondary | ICD-10-CM | POA: Diagnosis not present

## 2024-07-15 LAB — BMP8+EGFR
BUN/Creatinine Ratio: 22 (ref 12–28)
BUN: 28 mg/dL — ABNORMAL HIGH (ref 8–27)
CO2: 22 mmol/L (ref 20–29)
Calcium: 8.9 mg/dL (ref 8.7–10.3)
Chloride: 105 mmol/L (ref 96–106)
Creatinine, Ser: 1.25 mg/dL — ABNORMAL HIGH (ref 0.57–1.00)
Glucose: 202 mg/dL — ABNORMAL HIGH (ref 70–99)
Potassium: 4.9 mmol/L (ref 3.5–5.2)
Sodium: 141 mmol/L (ref 134–144)
eGFR: 41 mL/min/1.73 — ABNORMAL LOW (ref >59)

## 2024-07-20 ENCOUNTER — Ambulatory Visit: Payer: Self-pay | Admitting: Family Medicine

## 2024-07-29 ENCOUNTER — Other Ambulatory Visit: Payer: Self-pay | Admitting: Family Medicine

## 2024-07-29 DIAGNOSIS — J449 Chronic obstructive pulmonary disease, unspecified: Secondary | ICD-10-CM

## 2024-08-04 ENCOUNTER — Ambulatory Visit: Admitting: Family Medicine

## 2024-08-04 ENCOUNTER — Other Ambulatory Visit

## 2024-08-10 ENCOUNTER — Other Ambulatory Visit: Payer: Self-pay | Admitting: Internal Medicine

## 2024-08-11 ENCOUNTER — Ambulatory Visit (INDEPENDENT_AMBULATORY_CARE_PROVIDER_SITE_OTHER): Admitting: Family Medicine

## 2024-08-11 ENCOUNTER — Other Ambulatory Visit: Payer: Self-pay | Admitting: Cardiology

## 2024-08-11 ENCOUNTER — Ambulatory Visit

## 2024-08-11 ENCOUNTER — Encounter: Payer: Self-pay | Admitting: Family Medicine

## 2024-08-11 VITALS — BP 139/56 | HR 62 | Temp 97.7°F | Ht 67.0 in

## 2024-08-11 DIAGNOSIS — M05711 Rheumatoid arthritis with rheumatoid factor of right shoulder without organ or systems involvement: Secondary | ICD-10-CM | POA: Diagnosis not present

## 2024-08-11 DIAGNOSIS — I2510 Atherosclerotic heart disease of native coronary artery without angina pectoris: Secondary | ICD-10-CM

## 2024-08-11 DIAGNOSIS — E559 Vitamin D deficiency, unspecified: Secondary | ICD-10-CM

## 2024-08-11 DIAGNOSIS — E785 Hyperlipidemia, unspecified: Secondary | ICD-10-CM | POA: Diagnosis not present

## 2024-08-11 DIAGNOSIS — I482 Chronic atrial fibrillation, unspecified: Secondary | ICD-10-CM

## 2024-08-11 DIAGNOSIS — I10 Essential (primary) hypertension: Secondary | ICD-10-CM

## 2024-08-11 DIAGNOSIS — L03116 Cellulitis of left lower limb: Secondary | ICD-10-CM | POA: Diagnosis not present

## 2024-08-11 DIAGNOSIS — M05712 Rheumatoid arthritis with rheumatoid factor of left shoulder without organ or systems involvement: Secondary | ICD-10-CM

## 2024-08-11 DIAGNOSIS — L03115 Cellulitis of right lower limb: Secondary | ICD-10-CM

## 2024-08-11 DIAGNOSIS — N1832 Chronic kidney disease, stage 3b: Secondary | ICD-10-CM

## 2024-08-11 DIAGNOSIS — I890 Lymphedema, not elsewhere classified: Secondary | ICD-10-CM | POA: Diagnosis not present

## 2024-08-11 DIAGNOSIS — I480 Paroxysmal atrial fibrillation: Secondary | ICD-10-CM | POA: Diagnosis not present

## 2024-08-11 DIAGNOSIS — J441 Chronic obstructive pulmonary disease with (acute) exacerbation: Secondary | ICD-10-CM | POA: Diagnosis not present

## 2024-08-11 LAB — LIPID PANEL

## 2024-08-11 MED ORDER — BREZTRI AEROSPHERE 160-9-4.8 MCG/ACT IN AERO: 2.0000 | INHALATION_SPRAY | Freq: Two times a day (BID) | RESPIRATORY_TRACT | 11 refills | Status: DC

## 2024-08-11 MED ORDER — PREDNISONE 20 MG PO TABS: 20.0000 mg | ORAL_TABLET | Freq: Every day | ORAL | 0 refills | Status: AC

## 2024-08-11 MED ORDER — CLINDAMYCIN HCL 150 MG PO CAPS: 450.0000 mg | ORAL_CAPSULE | Freq: Three times a day (TID) | ORAL | 0 refills | Status: AC

## 2024-08-11 NOTE — Progress Notes (Signed)
 Established Patient Office Visit  Subjective   Patient ID: Courtney Berry, female    DOB: [birth year]  Age: 89 y.o. MRN: [id number]  Chief Complaint  Patient presents with   Medical Management of Chronic Issues    HPI  History of Present Illness   Courtney Berry is an 89 year old female with atrial fibrillation and chronic kidney disease who presents with fatigue and cough.  COPD - Fatigue and persistent dry cough for the past four weeks - Increased shortness of breath, wheezing - Shortness of breath when lying down - Uses nebulizer each morning with some relief - Does not have maintenance inhaler  Lower extremity symptoms - Increased redness and pain in legs for the past week - Compression pumps used for lymphedema; hasn't been able to use them as much due to pain - Legs feel improved compared to last visit (hospital follow up) but remain painful  Cardiac symptoms and management - Atrial fibrillation managed with Eliquis , amiodarone , and Lopressor  - Occasional heart flutters - Blood pressure managed with Benicar  and amlodipine  - Not on regular diuretic due to kidney concerns; limited use permitted by cardiologist  Chronic kidney disease management - Chronic kidney disease with regular nephrology follow-up     CKD -established with Dr. Archie    ROS As per HPI.    Objective:     BP (!) 139/56   Pulse 62   Temp 97.7 F (36.5 C) (Temporal)   Ht 5' 7 (1.702 m)   SpO2 94%   BMI 30.07 kg/m    Physical Exam Vitals and nursing note reviewed.  Constitutional:      General: She is not in acute distress.    Appearance: She is obese. She is not ill-appearing, toxic-appearing or diaphoretic.  HENT:     Right Ear: Tympanic membrane, ear canal and external ear normal.     Left Ear: Tympanic membrane, ear canal and external ear normal.     Nose: Congestion present.     Mouth/Throat:     Mouth: Mucous membranes are moist.     Pharynx: Oropharynx is clear. No  oropharyngeal exudate or posterior oropharyngeal erythema.  Eyes:     General:        Right eye: No discharge.        Left eye: No discharge.     Conjunctiva/sclera: Conjunctivae normal.  Cardiovascular:     Rate and Rhythm: Normal rate and regular rhythm.     Heart sounds: Normal heart sounds. No murmur heard. Pulmonary:     Effort: Pulmonary effort is normal. No respiratory distress.     Breath sounds: Normal breath sounds. No wheezing, rhonchi or rales.  Musculoskeletal:     Cervical back: Neck supple. No rigidity.     Right lower leg: 2+ Pitting Edema (with erythema, warmth and tenderness) present.     Left lower leg: 2+ Pitting Edema (with erythema, warmth, and tenderness) present.  Skin:    General: Skin is warm and dry.  Neurological:     Mental Status: She is alert and oriented to person, place, and time. Mental status is at baseline.     Gait: Gait abnormal (arrives in wheelchair).  Psychiatric:        Mood and Affect: Mood normal.        Behavior: Behavior normal.      No results found for any visits on 08/11/24.    The ASCVD Risk score (Arnett DK, et al., 2019) failed to  calculate for the following reasons:   The 2019 ASCVD risk score is only valid for ages 55 to 5    Assessment & Plan:   Margia was seen today for medical management of chronic issues.  Diagnoses and all orders for this visit:  COPD with acute exacerbation (HCC) -     budesonide-glycopyrrolate-formoterol  (BREZTRI  AEROSPHERE) 160-9-4.8 MCG/ACT AERO inhaler; Inhale 2 puffs into the lungs 2 (two) times daily. -     predniSONE  (DELTASONE ) 20 MG tablet; Take 1 tablet (20 mg total) by mouth daily with breakfast for 5 days.  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   Assessment and Plan    Chronic obstructive pulmonary disease (COPD) with acute exacerbation Acute exacerbation with dry cough and dyspnea. Current treatment provides some relief. - Prescribe Breztri  inhaler for daily use. - Prescribe prednisone  burst - Continue nebulizer and albuterol  inhaler as needed.  Cellulitis of lower extremities Recurrent cellulitis with recent redness and soreness. Previous clindamycin  effective. - Prescribe clindamycin  if redness worsens or persists.  Lymphedema of lower extremities Chronic lymphedema with soreness and swelling. Use compression pumps consistently.   Atrial fibrillation Chronic atrial fibrillation managed with Eliquis , amiodarone , and Lopressor . RRR today in office.   Hypertension Hypertension well-controlled with Benicar  and amlodipine . Fluid pills used sparingly.  Chronic kidney disease Established with nephrology.     CAD Taking daily aspirin.   HLD Lipid panel pending.   RA Not established with rheumatology. Denies significant symptoms.   Total time spent caring for the patient today was 44 minutes. This includes time spent before the visit reviewing the chart, time spent during the visit, and time spent after the visit on documentation.  Return in about 3 months (around 11/10/2024) for chronic follow up.   The patient indicates understanding of these issues and agrees with the plan.  Annabella CHRISTELLA Search, FNP

## 2024-08-12 ENCOUNTER — Ambulatory Visit: Payer: Self-pay | Admitting: Family Medicine

## 2024-08-12 DIAGNOSIS — E875 Hyperkalemia: Secondary | ICD-10-CM

## 2024-08-12 DIAGNOSIS — E559 Vitamin D deficiency, unspecified: Secondary | ICD-10-CM

## 2024-08-12 LAB — CBC WITH DIFFERENTIAL/PLATELET
Basophils Absolute: 0 x10E3/uL (ref 0.0–0.2)
Basos: 0 %
EOS (ABSOLUTE): 0.6 x10E3/uL — ABNORMAL HIGH (ref 0.0–0.4)
Eos: 8 %
Hematocrit: 37.2 % (ref 34.0–46.6)
Hemoglobin: 11.4 g/dL (ref 11.1–15.9)
Immature Grans (Abs): 0 x10E3/uL (ref 0.0–0.1)
Immature Granulocytes: 0 %
Lymphocytes Absolute: 2.2 x10E3/uL (ref 0.7–3.1)
Lymphs: 29 %
MCH: 31.9 pg (ref 26.6–33.0)
MCHC: 30.6 g/dL — ABNORMAL LOW (ref 31.5–35.7)
MCV: 104 fL — ABNORMAL HIGH (ref 79–97)
Monocytes Absolute: 0.8 x10E3/uL (ref 0.1–0.9)
Monocytes: 11 %
Neutrophils Absolute: 3.9 x10E3/uL (ref 1.4–7.0)
Neutrophils: 52 %
Platelets: 172 x10E3/uL (ref 150–450)
RBC: 3.57 x10E6/uL — ABNORMAL LOW (ref 3.77–5.28)
RDW: 10.7 % — ABNORMAL LOW (ref 11.7–15.4)
WBC: 7.5 x10E3/uL (ref 3.4–10.8)

## 2024-08-12 LAB — CMP14+EGFR
ALT: 10 IU/L (ref 0–32)
AST: 19 IU/L (ref 0–40)
Albumin: 3.4 g/dL — AB (ref 3.7–4.7)
Alkaline Phosphatase: 121 IU/L (ref 48–129)
BUN/Creatinine Ratio: 14 (ref 12–28)
BUN: 20 mg/dL (ref 8–27)
Bilirubin Total: 0.4 mg/dL (ref 0.0–1.2)
CO2: 25 mmol/L (ref 20–29)
Calcium: 9 mg/dL (ref 8.7–10.3)
Chloride: 103 mmol/L (ref 96–106)
Creatinine, Ser: 1.39 mg/dL — AB (ref 0.57–1.00)
Globulin, Total: 3.4 g/dL (ref 1.5–4.5)
Glucose: 82 mg/dL (ref 70–99)
Potassium: 5.3 mmol/L — AB (ref 3.5–5.2)
Sodium: 140 mmol/L (ref 134–144)
Total Protein: 6.8 g/dL (ref 6.0–8.5)
eGFR: 36 mL/min/1.73 — AB (ref >59)

## 2024-08-12 LAB — LIPID PANEL
Cholesterol, Total: 164 mg/dL (ref 100–199)
HDL: 62 mg/dL (ref >39)
LDL CALC COMMENT:: 2.6 ratio (ref 0.0–4.4)
LDL Chol Calc (NIH): 78 mg/dL (ref 0–99)
Triglycerides: 136 mg/dL (ref 0–149)
VLDL Cholesterol Cal: 24 mg/dL (ref 5–40)

## 2024-08-12 LAB — TSH: TSH: 3.77 u[IU]/mL (ref 0.450–4.500)

## 2024-08-12 LAB — VITAMIN D 25 HYDROXY (VIT D DEFICIENCY, FRACTURES): Vit D, 25-Hydroxy: 18.6 ng/mL — AB (ref 30.0–100.0)

## 2024-08-12 MED ORDER — VITAMIN D (ERGOCALCIFEROL) 1.25 MG (50000 UNIT) PO CAPS: 50000.0000 [IU] | ORAL_CAPSULE | ORAL | 0 refills | Status: DC

## 2024-08-15 ENCOUNTER — Telehealth: Payer: Self-pay | Admitting: Family Medicine

## 2024-08-15 ENCOUNTER — Other Ambulatory Visit

## 2024-08-15 NOTE — Telephone Encounter (Signed)
 Copied from CRM [id number]. Topic: Appointments - Scheduling Inquiry for Clinic >> Aug 15, 2024 11:01 AM Courtney Berry wrote: Pt won't make appt today

## 2024-08-15 NOTE — Telephone Encounter (Signed)
 It was a lab only appt and she is scheduled for tomorrow on the lab DAR.

## 2024-08-16 ENCOUNTER — Other Ambulatory Visit

## 2024-08-16 DIAGNOSIS — E875 Hyperkalemia: Secondary | ICD-10-CM | POA: Diagnosis not present

## 2024-08-16 LAB — BMP8+EGFR
BUN/Creatinine Ratio: 18 (ref 12–28)
BUN: 24 mg/dL (ref 8–27)
CO2: 26 mmol/L (ref 20–29)
Calcium: 9.1 mg/dL (ref 8.7–10.3)
Chloride: 103 mmol/L (ref 96–106)
Creatinine, Ser: 1.36 mg/dL — ABNORMAL HIGH (ref 0.57–1.00)
Glucose: 100 mg/dL — ABNORMAL HIGH (ref 70–99)
Potassium: 4.6 mmol/L (ref 3.5–5.2)
Sodium: 143 mmol/L (ref 134–144)
eGFR: 37 mL/min/1.73 — ABNORMAL LOW (ref >59)

## 2024-08-17 ENCOUNTER — Ambulatory Visit: Payer: Self-pay | Admitting: Family Medicine

## 2024-09-13 ENCOUNTER — Encounter: Payer: Self-pay | Admitting: Cardiology

## 2024-09-13 ENCOUNTER — Ambulatory Visit: Attending: Cardiology | Admitting: Cardiology

## 2024-09-13 VITALS — BP 138/72 | HR 58 | Ht 65.0 in | Wt 204.4 lb

## 2024-09-13 DIAGNOSIS — N1832 Chronic kidney disease, stage 3b: Secondary | ICD-10-CM | POA: Diagnosis not present

## 2024-09-13 DIAGNOSIS — I4819 Other persistent atrial fibrillation: Secondary | ICD-10-CM | POA: Diagnosis not present

## 2024-09-13 DIAGNOSIS — I10 Essential (primary) hypertension: Secondary | ICD-10-CM

## 2024-09-13 DIAGNOSIS — I890 Lymphedema, not elsewhere classified: Secondary | ICD-10-CM

## 2024-09-13 NOTE — Patient Instructions (Signed)

## 2024-09-13 NOTE — Progress Notes (Signed)
    Cardiology Office Note  Date: 09/13/2024   ID: Courtney Berry, DOB 02-Jun-1933, MRN [id number]  History of Present Illness: Courtney Berry is an 89 y.o. female last seen in April.  She is here today with her daughter for follow-up visit.  Reports no palpitations or chest discomfort.  Using mechanical compression device at home for treatment of lymphedema and no longer on diuretics.  She has had somewhat more fluid retention recently.  I reviewed her medications.  She does not report any spontaneous bleeding problems on Eliquis .  She had lab work in September as noted below.  She was in sinus rhythm by follow-up ECG in August.  Physical Exam: VS:  BP 138/72   Pulse (!) 58   Ht 5' 5 (1.651 m)   Wt 204 lb 6.4 oz (92.7 kg)   SpO2 97%   BMI 34.01 kg/m , BMI Body mass index is 34.01 kg/m.  Wt Readings from Last 3 Encounters:  09/13/24 204 lb 6.4 oz (92.7 kg)  06/18/24 192 lb (87.1 kg)  03/16/24 189 lb (85.7 kg)    General: Patient appears comfortable at rest. HEENT: Conjunctiva and lids normal. Neck: Supple, no elevated JVP or carotid bruits. Lungs: Clear to auscultation, nonlabored breathing at rest. Cardiac: Regular rate and rhythm, 1/6 systolic murmur without gallop. Extremities: Chronic appearing lymphedema.  ECG:  An ECG dated 06/18/2024 was personally reviewed today and demonstrated:  Sinus rhythm with PACs.  Labwork: 08/11/2024: ALT 10; AST 19; Hemoglobin 11.4; Platelets 172; TSH 3.770 08/16/2024: BUN 24; Creatinine, Ser 1.36; Potassium 4.6; Sodium 143     Component Value Date/Time   CHOL 164 08/11/2024 1530   TRIG 136 08/11/2024 1530   HDL 62 08/11/2024 1530   CHOLHDL 2.6 08/11/2024 1530   CHOLHDL 3.7 08/02/2018 0417   VLDL 38 08/02/2018 0417   LDLCALC 78 08/11/2024 1530   Other Studies Reviewed Today:  No interval cardiac testing for review today.  Assessment and Plan:  1.  Persistent atrial fibrillation, CHA2DS2-VASc score of 4.  Asymptomatic with no  recurring palpitations on current regimen.  Continue amiodarone  100 mg daily.  Recent TSH and LFTs normal.  She is also on Eliquis  5 mg twice daily and Lopressor  12.5 mg twice daily.   2.  Primary hypertension.  No change to current regimen which includes Benicar  20 mg daily and Norvasc  2.5 mg daily.   3.  Lymphedema..  Continues to use mechanical compression pump at home.   4.  CKD stage IIIb, creatinine 1.36 with GFR 37.  Disposition:  Follow up 6 months.  Signed, Jayson JUDITHANN Sierras, M.D., F.A.C.C. [facility]

## 2024-09-22 ENCOUNTER — Ambulatory Visit (INDEPENDENT_AMBULATORY_CARE_PROVIDER_SITE_OTHER): Admitting: Family Medicine

## 2024-09-22 VITALS — BP 138/48 | HR 57 | Temp 97.9°F | Ht 65.0 in | Wt 202.0 lb

## 2024-09-22 DIAGNOSIS — F5104 Psychophysiologic insomnia: Secondary | ICD-10-CM | POA: Diagnosis not present

## 2024-09-22 DIAGNOSIS — D509 Iron deficiency anemia, unspecified: Secondary | ICD-10-CM | POA: Diagnosis not present

## 2024-09-22 DIAGNOSIS — I890 Lymphedema, not elsewhere classified: Secondary | ICD-10-CM

## 2024-09-22 DIAGNOSIS — H6593 Unspecified nonsuppurative otitis media, bilateral: Secondary | ICD-10-CM | POA: Diagnosis not present

## 2024-09-22 MED ORDER — LEVOCETIRIZINE DIHYDROCHLORIDE 5 MG PO TABS: 2.5000 mg | ORAL_TABLET | Freq: Every evening | ORAL | 3 refills | Status: DC

## 2024-09-22 NOTE — Progress Notes (Unsigned)
   Acute Office Visit  Subjective:     Patient ID: Courtney Berry, female    DOB: Sep 08, 1933, 89 y.o.   MRN: [id number]  Chief Complaint  Patient presents with   Otalgia    HPI Patient is in today for ***  ROS      Objective:    BP (!) 138/48   Pulse (!) 57   Temp 97.9 F (36.6 C) (Temporal)   Ht 5' 5 (1.651 m)   Wt 202 lb (91.6 kg)   SpO2 95%   BMI 33.61 kg/m  {Vitals History (Optional):23777}  Physical Exam  No results found for any visits on 09/22/24.      Assessment & Plan:   Problem List Items Addressed This Visit   None Visit Diagnoses       Fluid level behind tympanic membrane of both ears    -  Primary   Relevant Medications   levocetirizine (XYZAL) 5 MG tablet       Meds ordered this encounter  Medications   levocetirizine (XYZAL) 5 MG tablet    Sig: Take 0.5 tablets (2.5 mg total) by mouth every evening.    Dispense:  45 tablet    Refill:  3    Supervising Provider:   JOLINDA NORENE Berry [[id number]]    No follow-ups on file.  Courtney Berry Search, FNP

## 2024-09-23 ENCOUNTER — Encounter: Payer: Self-pay | Admitting: Family Medicine

## 2024-09-23 DIAGNOSIS — D509 Iron deficiency anemia, unspecified: Secondary | ICD-10-CM | POA: Insufficient documentation

## 2024-09-23 DIAGNOSIS — F5104 Psychophysiologic insomnia: Secondary | ICD-10-CM | POA: Insufficient documentation

## 2024-09-27 ENCOUNTER — Other Ambulatory Visit: Payer: Self-pay

## 2024-09-27 MED ORDER — FERROUS SULFATE 325 (65 FE) MG PO TABS: 325.0000 mg | ORAL_TABLET | Freq: Every day | ORAL | 0 refills | Status: DC

## 2024-10-15 ENCOUNTER — Other Ambulatory Visit: Payer: Self-pay | Admitting: Internal Medicine

## 2024-10-22 ENCOUNTER — Other Ambulatory Visit: Payer: Self-pay | Admitting: Internal Medicine

## 2024-11-01 ENCOUNTER — Other Ambulatory Visit: Payer: Self-pay | Admitting: Family Medicine

## 2024-11-01 DIAGNOSIS — E559 Vitamin D deficiency, unspecified: Secondary | ICD-10-CM

## 2024-11-14 ENCOUNTER — Telehealth: Payer: Self-pay | Admitting: *Deleted

## 2024-11-14 NOTE — Transitions of Care (Post Inpatient/ED Visit) (Signed)
" ° °  11/14/2024  Name: Courtney Berry MRN: [id number] DOB: 08-23-1933  Today's TOC FU Call Status: Today's TOC FU Call Status:: Unsuccessful Call (1st Attempt) Unsuccessful Call (1st Attempt) Date: 11/14/24  Attempted to reach the patient regarding the most recent Inpatient/ED visit.  Follow Up Plan: Additional outreach attempts will be made to reach the patient to complete the Transitions of Care (Post Inpatient/ED visit) call.   Andrea Dimes RN, BSN [place]  [facility] RN Care Manager [phone]  "

## 2024-11-15 ENCOUNTER — Telehealth: Payer: Self-pay | Admitting: *Deleted

## 2024-11-15 NOTE — Transitions of Care (Post Inpatient/ED Visit) (Signed)
 "  11/15/2024  Name: Courtney Berry MRN: [id number] DOB: 05-21-1933  Today's TOC FU Call Status: Today's TOC FU Call Status:: Successful TOC FU Call Completed TOC FU Call Complete Date: 11/15/24  Patient's Name and Date of Birth confirmed. DOB, Name  Transition Care Management Follow-up Telephone Call Date of Discharge: 11/12/24 Discharge Facility: Other [facility]) Name of Other ([facility]) Discharge Facility: [facility] Type of Discharge: Inpatient Admission Primary Inpatient Discharge Diagnosis:: COPD with acute exacerbation How have you been since you were released from the hospital?: Better Any questions or concerns?: No  Items Reviewed: Did you receive and understand the discharge instructions provided?: Yes Medications obtained,verified, and reconciled?: Yes (Medications Reviewed) Any new allergies since your discharge?: No Dietary orders reviewed?: Yes Type of Diet Ordered:: heart healthy Do you have support at home?: Yes People in Home [RPT]: spouse Name of Support/Comfort Primary Source: pt has support of spouse and adult daughter lives next door  [name] Today's note routed to primary care provider  Medications Reviewed Today: Medications Reviewed Today     Reviewed by Aura Mliss LABOR, RN (Registered Nurse) on 11/15/24 at 1448  Med List Status: <None>   Medication Order Taking? Sig Documenting Provider Last Dose Status Informant  acetaminophen  (TYLENOL ) 500 MG tablet [id number] Yes Take 1,000 mg by mouth every 6 (six) hours as needed for moderate pain. [provider]  Active Self  albuterol  (VENTOLIN  HFA) 108 (90 Base) MCG/ACT inhaler [id number] Yes TAKE 2 PUFFS BY MOUTH EVERY 6 HOURS AS NEEDED FOR WHEEZE OR SHORTNESS OF SHERIDA Joesph Annabella CHRISTELLA, FNP  Active   amiodarone  (PACERONE ) 200 MG tablet [id number] Yes TAKE 1/2 TABLET BY MOUTH DAILY Debera Jayson MATSU, MD  Active   amLODipine  (NORVASC ) 2.5 MG tablet [id number] Yes TAKE 1 TABLET BY  MOUTH EVERY DAY Debera Jayson MATSU, MD  Active   budesonide-glycopyrrolate-formoterol  (BREZTRI  AEROSPHERE) 160-9-4.8 MCG/ACT AERO inhaler [id number] Yes Inhale 2 puffs into the lungs 2 (two) times daily. Joesph Annabella CHRISTELLA, FNP  Active   cefdinir  (OMNICEF ) 300 MG capsule [id number] Yes Take 300 mg by mouth 2 (two) times daily. [provider]  Active   cyanocobalamin  1000 MCG tablet [id number] Yes Take 1 tablet (1,000 mcg total) by mouth daily. Pearlean Manus, MD  Active   ELIQUIS  5 MG TABS tablet [id number] Yes TAKE 1 TABLET BY MOUTH TWICE A DAY Debera Jayson MATSU, MD  Active   ferrous sulfate  325 (65 FE) MG tablet [id number] Yes Take 1 tablet (325 mg total) by mouth daily with breakfast. Joesph Annabella CHRISTELLA, FNP  Active   ipratropium-albuterol  (DUONEB) 0.5-2.5 (3) MG/3ML SOLN [id number] Yes Take 3 mLs by nebulization every 6 (six) hours as needed. Joesph Annabella CHRISTELLA, FNP  Active   levocetirizine (XYZAL ) 5 MG tablet [id number]  Take 0.5 tablets (2.5 mg total) by mouth every evening. Joesph Annabella CHRISTELLA, FNP  Active   levofloxacin  (LEVAQUIN ) 750 MG tablet [id number] Yes Take 750 mg by mouth daily. [provider]  Active   metoprolol  tartrate (LOPRESSOR ) 25 MG tablet [id number] Yes TAKE 1/2 TABLET TWICE A DAY BY MOUTH Debera Jayson MATSU, MD  Active   olmesartan  (BENICAR ) 20 MG tablet [id number] Yes TAKE 1 TABLET BY MOUTH EVERY DAY Wert, Michael B, MD  Active   oseltamivir (TAMIFLU) 30 MG capsule [id number] Yes Take 30 mg by mouth daily. [provider]  Active   predniSONE  (DELTASONE ) 20 MG tablet [id number] Yes Take 20 mg by mouth daily with breakfast. [provider]  Active   Probiotic Product (PROBIOTIC DAILY PO) [id number] Yes Take 1 tablet by mouth daily. [provider]  Active Self  Vitamin D , Ergocalciferol , (DRISDOL ) 1.25 MG (50000 UNIT) CAPS capsule [id number] Yes Take 1 capsule (50,000 Units total) by mouth every 7 (seven) days. Joesph Annabella HERO, FNP  Active    Med List Note Shelvy Joesph CROME, [facility] 08/01/18 1152): [facility] [place] [address]) is preference when the Drug Store in [place] is closed.             Home Care and Equipment/Supplies: Were Home Health Services Ordered?: Yes Name of Home Health Agency:: [facility] Has Agency set up a time to come to your home?: Yes First Home Health Visit Date: 11/14/24 Any new equipment or medical supplies ordered?: No  Functional Questionnaire: Do you need assistance with bathing/showering or dressing?: Yes (pt states needs assistance at times but not everyday) Do you need assistance with meal preparation?: No Do you need assistance with eating?: No Do you have difficulty maintaining continence: No Do you need assistance with getting out of bed/getting out of a chair/moving?: Yes (has cane to use if needed) Do you have difficulty managing or taking your medications?: Yes (daughter [name] provides oversight (was unable to speak with daughter today), reviewed with patient)  Follow up appointments reviewed: PCP Follow-up appointment confirmed?: Yes Date of PCP follow-up appointment?: 11/21/24 Follow-up Provider: Annabella Joesph Specialist [facility] Follow-up appointment confirmed?: NA Do you need transportation to your follow-up appointment?: No Do you understand care options if your condition(s) worsen?: Yes-patient verbalized understanding  SDOH Interventions Today    Flowsheet Row Most Recent Value  SDOH Interventions   Food Insecurity Interventions Intervention Not Indicated  Housing Interventions Intervention Not Indicated  Transportation Interventions Intervention Not Indicated  Utilities Interventions Intervention Not Indicated    Goals Addressed             This Visit's Progress    VBCI Transitions of Care (TOC) Care Plan       Problems:  Recent Hospitalization for treatment of COPD Knowledge Deficit Related to COPD Patient reports her daughter lives next door, provider  oversight for medications, is not available at present to discuss medications, pt states she is taking antibiotics, has intermittent nausea and vomiting, does not have antiemetic to take, pt reports she is eating well and drinking plenty of fluids    Home health saw pt yesterday.  Goal:  Over the next 30 days, the patient will not experience hospital readmission  Interventions:  COPD Interventions: Advised patient to track and manage COPD triggers Advised patient to self assesses COPD action plan zone and make appointment with provider if in the yellow zone for 48 hours without improvement Assessed social determinant of health barriers Discussed the importance of adequate rest and management of fatigue with COPD Provided education about and advised patient to utilize infection prevention strategies to reduce risk of respiratory infection Provided instruction about proper use of medications used for management of COPD including inhalers Screening for signs and symptoms of depression related to chronic disease state  Reviewed safety precautions Reviewed importance of staying well hydrated Reviewed signs /symptoms infection, reportable signs/ symptoms Reviewed side effects of antibiotics- diarrhea, nausea and vomiting In basket message sent to primary care provider reporting pt states she is taking antibiotics and is having intermittent nausea and vomiting, does not have antiemetic on hand, ask for this to be called in to patient's pharmacy, also if pt needs to be evaluated or  anything different for pt to do, please advise  Patient Self Care Activities:  Attend all scheduled provider appointments Call pharmacy for medication refills 3-7 days in advance of running out of medications Call provider office for new concerns or questions  Notify RN Care Manager of TOC call rescheduling needs Participate in Transition of Care Program/Attend TOC scheduled calls Take medications as prescribed    identify and remove indoor air pollutants limit outdoor activity during cold weather listen for public air quality announcements every day do breathing exercises every day develop a rescue plan eliminate symptom triggers at home follow rescue plan if symptoms flare-up get at least 7 to 8 hours of sleep at night use devices that will help like a cane, sock-puller or reacher practice relaxation or meditation daily do breathing exercises at least 2 times each day Please report any unrelieved nausea and vomiting to your primary care provider Side effects of antibiotics can be diarrhea, nausea and vomiting  Plan:  Telephone follow up appointment with care management team member scheduled for:  11/23/23 @ 215 pm The patient has been provided with contact information for the care management team and has been advised to call with any health related questions or concerns.         Mliss Creed [facility], BSN RN Care Manager/ Transition of Care [place]/ [facility] [phone]  "

## 2024-11-16 ENCOUNTER — Telehealth: Payer: Self-pay | Admitting: Family Medicine

## 2024-11-16 ENCOUNTER — Telehealth: Payer: Self-pay | Admitting: *Deleted

## 2024-11-16 DIAGNOSIS — R112 Nausea with vomiting, unspecified: Secondary | ICD-10-CM

## 2024-11-16 MED ORDER — ONDANSETRON HCL 4 MG PO TABS: 4.0000 mg | ORAL_TABLET | Freq: Three times a day (TID) | ORAL | 0 refills | Status: DC | PRN

## 2024-11-16 NOTE — Telephone Encounter (Signed)
 Zofran  ordered.

## 2024-11-16 NOTE — Patient Outreach (Signed)
 11/16/2024  Patient ID: Courtney Berry, female   DOB: 08-04-1933, 89 y.o.   MRN: [id number]  Unable to reach pt by telephone today for follow up on nausea and vomiting and to make sure pt picked up zofran  from pharmacy, as noted per EMR, pt did go to [facility] ED on 11/15/24.  Mliss Creed [facility], BSN RN Care Manager/ Transition of Care [place]/ [facility]: [place] Rhonda W [phone]

## 2024-11-16 NOTE — Telephone Encounter (Signed)
 Left detailed message making patient aware that Zofran  Rx was sent to the pharmacy. Advised for patient to call back if she has any questions.

## 2024-11-16 NOTE — Telephone Encounter (Signed)
-----   Message from Nurse Mliss FALCON, RN sent at 11/15/2024  3:04 PM EST ----- Regarding: update/ request Spoke with pt today, enrolled in TOC 30 day program, pt hospitalized for Flu A/ COPD exacerbation, pt reports intermittent nausea and vomiting, reports she is taking antibiotics but I had difficulty with med rec due to daughter not there and unable to speak with her, not sure if this is due to antibiotics, pt doesn't have antiemetic on hand, can you call in zofran  or something comparable to her pharmacy or if anything different you want her to do, please let us  know.  She is to see you on 11/21/24.    thanks

## 2024-11-21 ENCOUNTER — Ambulatory Visit: Payer: Self-pay | Admitting: Family Medicine

## 2024-11-22 ENCOUNTER — Telehealth: Admitting: *Deleted

## 2024-11-23 ENCOUNTER — Telehealth: Payer: Self-pay | Admitting: *Deleted

## 2024-11-23 NOTE — Transitions of Care (Post Inpatient/ED Visit) (Signed)
 "  11/23/2024  Name: Courtney Berry MRN: [id number] DOB: 08/02/1933  Today's TOC FU Call Status: Today's TOC FU Call Status:: Successful TOC FU Call Completed TOC FU Call Complete Date: 11/23/24  Patient's Name and Date of Birth confirmed. Name, DOB  Transition Care Management Follow-up Telephone Call Date of Discharge: 11/22/24 Discharge Facility: Other ([facility]) Name of Other ([facility]) Discharge Facility: [facility] Type of Discharge: Inpatient Admission Primary Inpatient Discharge Diagnosis:: Clinical Document Encounter Summary - Acute on chronic congestive heart failure, unspecified heart failure type (CMS-HCC) (Primary Dx); How have you been since you were released from the hospital?: Better (eating, drinking without difficulty, uses cane for ambulation, no issues with bowel/ bladder) Any questions or concerns?: No  Items Reviewed: Did you receive and understand the discharge instructions provided?: Yes Any new allergies since your discharge?: No Dietary orders reviewed?: Yes Type of Diet Ordered:: heart healthy Do you have support at home?: Yes People in Home [RPT]: spouse Name of Support/Comfort Primary Source: spouse in the home,  daughter [name] lives next door  Medications Reviewed Today: Medications Reviewed Today     Reviewed by Aura Mliss LABOR, RN (Registered Nurse) on 11/23/24 at 1332  Med List Status: <None>   Medication Order Taking? Sig Documenting Provider Last Dose Status Informant  acetaminophen  (TYLENOL ) 500 MG tablet [id number] Yes Take 1,000 mg by mouth every 6 (six) hours as needed for moderate pain. [provider]  Active Self  albuterol  (VENTOLIN  HFA) 108 (90 Base) MCG/ACT inhaler [id number] Yes TAKE 2 PUFFS BY MOUTH EVERY 6 HOURS AS NEEDED FOR WHEEZE OR SHORTNESS OF SHERIDA Joesph Annabella CHRISTELLA, FNP  Active   amiodarone  (PACERONE ) 200 MG tablet [id number] Yes TAKE 1/2 TABLET BY MOUTH DAILY Debera Jayson MATSU, MD  Active    amLODipine  (NORVASC ) 2.5 MG tablet [id number] Yes TAKE 1 TABLET BY MOUTH EVERY DAY Debera Jayson MATSU, MD  Active   azithromycin  (ZITHROMAX ) 500 MG tablet [id number] Yes Take 500 mg by mouth daily. [provider]  Active   benzonatate  (TESSALON ) 100 MG capsule [id number] Yes Take 100 mg by mouth 3 (three) times daily as needed for cough. [provider]  Active   budesonide-glycopyrrolate-formoterol  (BREZTRI  AEROSPHERE) 160-9-4.8 MCG/ACT AERO inhaler [id number] Yes Inhale 2 puffs into the lungs 2 (two) times daily. Joesph Annabella CHRISTELLA, FNP  Active   bumetanide (BUMEX) 0.5 MG tablet [id number] Yes Take 0.5 mg by mouth daily. [provider]  Active   cyanocobalamin  1000 MCG tablet [id number] Yes Take 1 tablet (1,000 mcg total) by mouth daily. Pearlean Manus, MD  Active   ELIQUIS  5 MG TABS tablet [id number] Yes TAKE 1 TABLET BY MOUTH TWICE A DAY Debera Jayson MATSU, MD  Active   ferrous sulfate  325 (65 FE) MG tablet [id number] Yes Take 1 tablet (325 mg total) by mouth daily with breakfast. Joesph Annabella CHRISTELLA, FNP  Active   guaiFENesin (ROBITUSSIN) 100 MG/5ML liquid [id number] Yes Take 200 mg by mouth every 4 (four) hours as needed for cough or to loosen phlegm. [provider]  Active   HYDROcodone  bit-homatropine (HYCODAN) 5-1.5 MG/5ML syrup [id number] Yes Take 5 mLs by mouth daily. Take at hs x 5 days [provider]  Active   ipratropium-albuterol  (DUONEB) 0.5-2.5 (3) MG/3ML SOLN [id number] Yes Take 3 mLs by nebulization every 6 (six) hours as needed. Joesph Annabella CHRISTELLA, FNP  Active   levocetirizine (XYZAL ) 5 MG tablet [id number]  Take 0.5 tablets (2.5 mg total) by mouth every evening.  Patient not taking: Reported on 11/23/2024   Joesph Annabella HERO, FNP  Active   levofloxacin  (LEVAQUIN ) 500 MG tablet [id number] Yes Take 500 mg by mouth daily. [provider]  Active   metoprolol  tartrate (LOPRESSOR ) 25 MG tablet [id number] Yes TAKE 1/2 TABLET TWICE A DAY BY MOUTH  Debera Jayson MATSU, MD  Active   nystatin  (MYCOSTATIN ) 100000 UNIT/ML suspension [id number] Yes Take 500,000 Units by mouth 4 (four) times daily. [provider]  Active   nystatin  (MYCOSTATIN /NYSTOP ) powder [id number] Yes Apply 1 Application topically 3 (three) times daily. [provider]  Active   olmesartan  (BENICAR ) 20 MG tablet [id number] Yes TAKE 1 TABLET BY MOUTH EVERY DAY Wert, Michael B, MD  Active   ondansetron  (ZOFRAN ) 4 MG tablet [id number] Yes Take 1 tablet (4 mg total) by mouth every 8 (eight) hours as needed for nausea or vomiting. Joesph Annabella HERO, FNP  Active   ondansetron  (ZOFRAN -ODT) 4 MG disintegrating tablet [id number] Yes Take 4 mg by mouth every 8 (eight) hours as needed. [provider]  Active   predniSONE  (DELTASONE ) 20 MG tablet [id number] Yes Take 40 mg by mouth daily with breakfast. [provider]  Active   Probiotic Product (PROBIOTIC DAILY PO) [id number] Yes Take 1 tablet by mouth daily. [provider]  Active Self  Vitamin D , Ergocalciferol , (DRISDOL ) 1.25 MG (50000 UNIT) CAPS capsule [id number] Yes Take 1 capsule (50,000 Units total) by mouth every 7 (seven) days. Joesph Annabella HERO, FNP  Active   Med List Note Shelvy Joesph CROME, [facility] 08/01/18 1152): [facility] [place] [address]) is preference when the Drug Store in [place] is closed.             Home Care and Equipment/Supplies: Were Home Health Services Ordered?: No Any new equipment or medical supplies ordered?: No  Functional Questionnaire: Do you need assistance with bathing/showering or dressing?: Yes (pt states family assists as needed) Do you need assistance with meal preparation?: No Do you need assistance with eating?: No Do you have difficulty maintaining continence: No Do you need assistance with getting out of bed/getting out of a chair/moving?: Yes (uses cane as needed) Do you have difficulty managing or taking your medications?: Yes (daughter [name]  provides oversight)  Follow up appointments reviewed: PCP Follow-up appointment confirmed?: Yes Date of PCP follow-up appointment?: 12/01/24 Follow-up Provider: Nena Hummer NP Specialist Hospital Follow-up appointment confirmed?: NA Do you need transportation to your follow-up appointment?: No Do you understand care options if your condition(s) worsen?: Yes-patient verbalized understanding  SDOH Interventions Today    Flowsheet Row Most Recent Value  SDOH Interventions   Food Insecurity Interventions Intervention Not Indicated  Housing Interventions Intervention Not Indicated  Transportation Interventions Intervention Not Indicated  Utilities Interventions Intervention Not Indicated    Goals Addressed             This Visit's Progress    VBCI Transitions of Care (TOC) Care Plan       Problems:  Recent Hospitalization for treatment of CHF Patient reports she lives with spouse, has daughter [name] that lives next door and is heavily involved in her care, [name] is not available to speak with today, pt reports she has all medications and taking as prescribed Patient is wearing compression hose daily for management of lymphedema  Goal:  Over the next 30 days, the patient will not experience hospital readmission  Interventions:  Heart Failure Interventions: Basic overview and discussion of pathophysiology of Heart Failure reviewed Provided  education on low sodium diet Assessed need for readable accurate scales in home Provided education about placing scale on hard, flat surface Advised patient to weigh each morning after emptying bladder Discussed importance of daily weight and advised patient to weigh and record daily Discussed the importance of keeping all appointments with provider Advised patient to discuss change in health status, problems with medications with provider Screening for signs and symptoms of depression related to chronic disease state  Assessed social  determinant of health barriers  Reviewed safety precautions Reviewed signs/ symptoms of infection  Patient Self Care Activities:  Attend all scheduled provider appointments Call pharmacy for medication refills 3-7 days in advance of running out of medications Call provider office for new concerns or questions  Notify RN Care Manager of TOC call rescheduling needs Participate in Transition of Care Program/Attend TOC scheduled calls Take medications as prescribed   call office if I gain more than 2 pounds in one day or 5 pounds in one week do ankle pumps when sitting keep legs up while sitting track weight in diary use salt in moderation watch for swelling in feet, ankles and legs every day weigh myself daily develop a rescue plan follow rescue plan if symptoms flare-up eat more whole grains, fruits and vegetables, lean meats and healthy fats track symptoms and what helps feel better or worse dress right for the weather, hot or cold Wear compression hose daily, remove at night Keep a weight log  Plan:  Telephone follow up appointment with care management team member scheduled for:  11/30/24 @ 1115 am The patient has been provided with contact information for the care management team and has been advised to call with any health related questions or concerns.         Mliss Creed [facility], BSN RN Care Manager/ Transition of Care [place]/ [facility] [phone]  "

## 2024-11-26 ENCOUNTER — Other Ambulatory Visit: Payer: Self-pay | Admitting: Cardiology

## 2024-11-26 DIAGNOSIS — I480 Paroxysmal atrial fibrillation: Secondary | ICD-10-CM

## 2024-11-28 MED ORDER — APIXABAN 2.5 MG PO TABS: 2.5000 mg | ORAL_TABLET | Freq: Two times a day (BID) | ORAL | 5 refills | Status: DC

## 2024-11-28 NOTE — Telephone Encounter (Signed)
 Eliquis  dosage changed to 2.5mg  BID, new rx sent to pharmacy, medication list updated.  Attempted to contact pt to make aware of dosage change, number busy.   Called spoke with pt's daughter [name] she states pt is now Hospice, no need to refill Eliquis  she is no longer taking.

## 2024-11-28 NOTE — Telephone Encounter (Signed)
 Pt last saw Dr Debera 09/13/24, last labs 11/22/24 Creat 1.58, age 89, weight 91.6kg,  based on specified criteria pt is not on appropriate dosage of Eliquis .  Pt's kidney function has been declining, Creat elevated since Dec 2025. Pt has been at [facility] in Dec and Jan.  Based on Creat >1.5 and age >60, please advise if dosage of Eliquis  should be reduced to 2.5mg  BID.  Thanks!

## 2024-11-29 ENCOUNTER — Telehealth: Payer: Self-pay | Admitting: Family Medicine

## 2024-11-29 NOTE — Telephone Encounter (Signed)
 Pt daughter wants Tiffany to know that pt is in hospice

## 2024-11-30 ENCOUNTER — Other Ambulatory Visit: Payer: Self-pay

## 2024-11-30 NOTE — Transitions of Care (Post Inpatient/ED Visit) (Signed)
" °  Transition of Care week 2  Visit Note  11/30/2024  Name: Courtney Berry MRN: [id number]          DOB: Oct 31, 1933  Situation: Patient enrolled in [facility] 30-day program. Visit completed with daughter Courtney Berry by telephone.   Background:   Initial Transition Care Management Follow-up Telephone Call Discharge Date and Diagnosis: 11/22/24, Clinical Document Encounter Summary - Acute on chronic congestive heart failure, unspecified heart failure type (CMS-HCC) (Primary Dx);   Past Medical History:  Diagnosis Date   CKD (chronic kidney disease) stage 3, GFR 30-59 ml/min (HCC)    COPD (chronic obstructive pulmonary disease) (HCC)    Diverticulosis    Essential hypertension    History of chest pain    Low risk Cardiolite 2010   Lactic acidosis 09/03/2023   Lymphedema    Mixed hyperlipidemia    PAF (paroxysmal atrial fibrillation) (HCC)    Rheumatoid arthritis (HCC)    Severe sepsis (HCC) 09/03/2023    Assessment: Placed call to patient and spoke with Courtney Berry- daughter who states that patient is now on hospice.  Confirmed by Geni at [facility].      Recommendation:   Continue to call hospice for needs or concerns.  Follow Up Plan:   Closing From:  Transitions of Care Program- transitioned to Hospice- confirmed  Alan Ee, RN, BSN, CEN Population Health- Transition of Care Team.  [facility] [phone]     "

## 2024-12-01 ENCOUNTER — Ambulatory Visit

## 2024-12-01 ENCOUNTER — Inpatient Hospital Stay: Admitting: Nurse Practitioner

## 2024-12-01 DIAGNOSIS — J440 Chronic obstructive pulmonary disease with acute lower respiratory infection: Secondary | ICD-10-CM | POA: Diagnosis not present

## 2024-12-01 DIAGNOSIS — M069 Rheumatoid arthritis, unspecified: Secondary | ICD-10-CM

## 2024-12-01 DIAGNOSIS — I129 Hypertensive chronic kidney disease with stage 1 through stage 4 chronic kidney disease, or unspecified chronic kidney disease: Secondary | ICD-10-CM | POA: Diagnosis not present

## 2024-12-01 DIAGNOSIS — N183 Chronic kidney disease, stage 3 unspecified: Secondary | ICD-10-CM

## 2024-12-01 DIAGNOSIS — I480 Paroxysmal atrial fibrillation: Secondary | ICD-10-CM

## 2024-12-01 DIAGNOSIS — J189 Pneumonia, unspecified organism: Secondary | ICD-10-CM

## 2024-12-01 DIAGNOSIS — J1000 Influenza due to other identified influenza virus with unspecified type of pneumonia: Secondary | ICD-10-CM | POA: Diagnosis not present

## 2024-12-01 DIAGNOSIS — G629 Polyneuropathy, unspecified: Secondary | ICD-10-CM | POA: Diagnosis not present

## 2024-12-01 DIAGNOSIS — I2510 Atherosclerotic heart disease of native coronary artery without angina pectoris: Secondary | ICD-10-CM | POA: Diagnosis not present

## 2024-12-01 DIAGNOSIS — J441 Chronic obstructive pulmonary disease with (acute) exacerbation: Secondary | ICD-10-CM | POA: Diagnosis not present

## 2024-12-01 DIAGNOSIS — N390 Urinary tract infection, site not specified: Secondary | ICD-10-CM

## 2024-12-01 DIAGNOSIS — I890 Lymphedema, not elsewhere classified: Secondary | ICD-10-CM

## 2024-12-18 DEATH — deceased

## 2025-03-21 ENCOUNTER — Ambulatory Visit
